# Patient Record
Sex: Female | Born: 1970 | Race: White | Hispanic: No | Marital: Married | Smoking: Never smoker
Health system: Southern US, Community
[De-identification: ages and names within clinical notes are randomized; demographics above are authoritative.]

## PROBLEM LIST (undated history)

## (undated) DIAGNOSIS — M109 Gout, unspecified: Secondary | ICD-10-CM

## (undated) DIAGNOSIS — R7301 Impaired fasting glucose: Secondary | ICD-10-CM

## (undated) DIAGNOSIS — F419 Anxiety disorder, unspecified: Secondary | ICD-10-CM

## (undated) DIAGNOSIS — Z8669 Personal history of other diseases of the nervous system and sense organs: Secondary | ICD-10-CM

## (undated) DIAGNOSIS — K589 Irritable bowel syndrome without diarrhea: Secondary | ICD-10-CM

## (undated) DIAGNOSIS — T7840XA Allergy, unspecified, initial encounter: Secondary | ICD-10-CM

## (undated) DIAGNOSIS — E119 Type 2 diabetes mellitus without complications: Secondary | ICD-10-CM

## (undated) HISTORY — DX: Personal history of other diseases of the nervous system and sense organs: Z86.69

## (undated) HISTORY — DX: Irritable bowel syndrome, unspecified: K58.9

## (undated) HISTORY — DX: Impaired fasting glucose: R73.01

## (undated) HISTORY — PX: UPPER GASTROINTESTINAL ENDOSCOPY: SHX188

## (undated) HISTORY — DX: Allergy, unspecified, initial encounter: T78.40XA

## (undated) HISTORY — DX: Type 2 diabetes mellitus without complications: E11.9

## (undated) HISTORY — DX: Anxiety disorder, unspecified: F41.9

---

## 1997-02-22 HISTORY — PX: EYE SURGERY: SHX253

## 2000-02-23 HISTORY — PX: BREAST REDUCTION SURGERY: SHX8

## 2000-09-01 ENCOUNTER — Encounter: Payer: Self-pay | Admitting: Family Medicine

## 2000-09-01 ENCOUNTER — Ambulatory Visit (HOSPITAL_COMMUNITY): Admission: RE | Admit: 2000-09-01 | Discharge: 2000-09-01 | Payer: Self-pay | Admitting: Family Medicine

## 2000-12-05 ENCOUNTER — Other Ambulatory Visit: Admission: RE | Admit: 2000-12-05 | Discharge: 2000-12-05 | Payer: Self-pay | Admitting: Obstetrics and Gynecology

## 2000-12-06 ENCOUNTER — Ambulatory Visit (HOSPITAL_COMMUNITY): Admission: RE | Admit: 2000-12-06 | Discharge: 2000-12-06 | Payer: Self-pay | Admitting: Family Medicine

## 2000-12-06 ENCOUNTER — Encounter: Payer: Self-pay | Admitting: Family Medicine

## 2000-12-29 ENCOUNTER — Ambulatory Visit (HOSPITAL_COMMUNITY): Admission: RE | Admit: 2000-12-29 | Discharge: 2000-12-29 | Payer: Self-pay | Admitting: Family Medicine

## 2000-12-29 ENCOUNTER — Encounter: Payer: Self-pay | Admitting: Family Medicine

## 2001-04-26 ENCOUNTER — Encounter (HOSPITAL_COMMUNITY): Admission: RE | Admit: 2001-04-26 | Discharge: 2001-05-26 | Payer: Self-pay | Admitting: Preventative Medicine

## 2001-06-22 ENCOUNTER — Encounter: Payer: Self-pay | Admitting: *Deleted

## 2001-06-22 ENCOUNTER — Emergency Department (HOSPITAL_COMMUNITY): Admission: EM | Admit: 2001-06-22 | Discharge: 2001-06-22 | Payer: Self-pay | Admitting: *Deleted

## 2001-07-19 ENCOUNTER — Encounter (HOSPITAL_COMMUNITY): Admission: RE | Admit: 2001-07-19 | Discharge: 2001-08-18 | Payer: Self-pay | Admitting: Preventative Medicine

## 2001-12-11 ENCOUNTER — Encounter (INDEPENDENT_AMBULATORY_CARE_PROVIDER_SITE_OTHER): Payer: Self-pay | Admitting: *Deleted

## 2001-12-11 ENCOUNTER — Ambulatory Visit (HOSPITAL_BASED_OUTPATIENT_CLINIC_OR_DEPARTMENT_OTHER): Admission: RE | Admit: 2001-12-11 | Discharge: 2001-12-11 | Payer: Self-pay | Admitting: Specialist

## 2002-08-28 ENCOUNTER — Encounter: Payer: Self-pay | Admitting: *Deleted

## 2002-08-28 ENCOUNTER — Emergency Department (HOSPITAL_COMMUNITY): Admission: EM | Admit: 2002-08-28 | Discharge: 2002-08-28 | Payer: Self-pay | Admitting: *Deleted

## 2002-12-11 ENCOUNTER — Encounter: Payer: Self-pay | Admitting: Obstetrics and Gynecology

## 2002-12-11 ENCOUNTER — Ambulatory Visit (HOSPITAL_COMMUNITY): Admission: RE | Admit: 2002-12-11 | Discharge: 2002-12-11 | Payer: Self-pay | Admitting: Obstetrics and Gynecology

## 2003-02-23 HISTORY — PX: CHOLECYSTECTOMY: SHX55

## 2003-02-23 HISTORY — PX: ABDOMINAL HYSTERECTOMY: SHX81

## 2003-10-20 ENCOUNTER — Emergency Department (HOSPITAL_COMMUNITY): Admission: EM | Admit: 2003-10-20 | Discharge: 2003-10-20 | Payer: Self-pay | Admitting: Emergency Medicine

## 2003-10-22 ENCOUNTER — Ambulatory Visit (HOSPITAL_COMMUNITY): Admission: RE | Admit: 2003-10-22 | Discharge: 2003-10-22 | Payer: Self-pay | Admitting: Family Medicine

## 2003-10-24 ENCOUNTER — Ambulatory Visit (HOSPITAL_COMMUNITY): Admission: RE | Admit: 2003-10-24 | Discharge: 2003-10-24 | Payer: Self-pay | Admitting: Family Medicine

## 2003-10-25 ENCOUNTER — Inpatient Hospital Stay (HOSPITAL_COMMUNITY): Admission: AD | Admit: 2003-10-25 | Discharge: 2003-10-27 | Payer: Self-pay | Admitting: Family Medicine

## 2003-11-04 ENCOUNTER — Inpatient Hospital Stay (HOSPITAL_COMMUNITY): Admission: AD | Admit: 2003-11-04 | Discharge: 2003-11-06 | Payer: Self-pay | Admitting: Family Medicine

## 2004-01-14 ENCOUNTER — Observation Stay (HOSPITAL_COMMUNITY): Admission: RE | Admit: 2004-01-14 | Discharge: 2004-01-15 | Payer: Self-pay | Admitting: Obstetrics & Gynecology

## 2005-07-26 ENCOUNTER — Ambulatory Visit (HOSPITAL_COMMUNITY): Admission: RE | Admit: 2005-07-26 | Discharge: 2005-07-26 | Payer: Self-pay | Admitting: Family Medicine

## 2009-08-27 ENCOUNTER — Ambulatory Visit (HOSPITAL_COMMUNITY): Admission: RE | Admit: 2009-08-27 | Discharge: 2009-08-27 | Payer: Self-pay | Admitting: Obstetrics and Gynecology

## 2010-02-22 HISTORY — PX: BLADDER SUSPENSION: SHX72

## 2010-04-22 ENCOUNTER — Other Ambulatory Visit: Payer: Self-pay | Admitting: Obstetrics and Gynecology

## 2010-04-22 DIAGNOSIS — R928 Other abnormal and inconclusive findings on diagnostic imaging of breast: Secondary | ICD-10-CM

## 2010-04-28 ENCOUNTER — Ambulatory Visit
Admission: RE | Admit: 2010-04-28 | Discharge: 2010-04-28 | Disposition: A | Discharge status: Home / Self Care | Payer: Commercial Managed Care - PPO | Source: Ambulatory Visit | Attending: Obstetrics and Gynecology | Admitting: Obstetrics and Gynecology

## 2010-04-28 DIAGNOSIS — R928 Other abnormal and inconclusive findings on diagnostic imaging of breast: Secondary | ICD-10-CM

## 2010-05-10 LAB — URINALYSIS, MICROSCOPIC ONLY
Bilirubin Urine: NEGATIVE
Glucose, UA: NEGATIVE mg/dL
Hgb urine dipstick: NEGATIVE
Leukocytes, UA: NEGATIVE
Specific Gravity, Urine: >1.03 — ABNORMAL HIGH (ref 1.005–1.030)
Urobilinogen, UA: 0.2 mg/dL (ref 0.0–1.0)
pH: 6 (ref 5.0–8.0)

## 2010-05-10 LAB — CBC
HCT: 39.6 % (ref 36.0–46.0)
Hemoglobin: 13.7 g/dL (ref 12.0–15.0)
MCHC: 34.6 g/dL (ref 30.0–36.0)
MCV: 92.5 fL (ref 78.0–100.0)
RBC: 4.29 MIL/uL (ref 3.87–5.11)
WBC: 7.8 10*3/uL (ref 4.0–10.5)

## 2010-05-10 LAB — SURGICAL PCR SCREEN: Staphylococcus aureus: POSITIVE — AB

## 2010-07-10 NOTE — H&P (Signed)
NAME:  [name], [name]                  ACCOUNT NO.:  [id number]   MEDICAL RECORD NO.:  [id number]          PATIENT TYPE:  AMB   LOCATION:  DAY                           FACILITY:  [facility]   PHYSICIAN:  Lazaro Arms, M.D.   DATE OF BIRTH:  12-09-1970   DATE OF ADMISSION:  DATE OF DISCHARGE:  LH                                HISTORY & PHYSICAL   HISTORY OF PRESENT ILLNESS:  [name] is a 40 year old white female, gravida 3,  para 1, abortus 1, with a last menstrual period of January 01, 2004, who is  admitted for a vaginal hysterectomy.  The patient has had a long history of  very heavy menstrual periods, and of late they have been every 2-3 weeks.  She also has discomfort sometimes during, but always after, intercourse  within the next day or two.  She was tested for interstitial cystitis, and  had a negative potassium hydroxide test.  On examination, she has tenderness  of the uterus.  The adnexa are negative.  Ultrasound was normal in the past.   We discussed options, including endometrial ablation, but with her history  of pain during and significantly after intercourse, we decided to proceed  with a vaginal hysterectomy.   PAST MEDICAL HISTORY:  Negative.   PAST SURGICAL HISTORY:  1.  Tubal ligation.  2.  Breast reduction.  3.  Laparoscopic cholecystectomy just recently.   ALLERGIES:  None.   MEDICATIONS:  None.   REVIEW OF SYSTEMS:  Otherwise negative.   PHYSICAL EXAMINATION:  HEENT:  Unremarkable.  NECK:  Thyroid is normal.  LUNGS:  Clear.  HEART:  Regular rate and rhythm without murmurs, rubs, or gallops.  BREASTS:  Without mass, discharge, or skin changes.  ABDOMEN:  Benign.  No hepatosplenomegaly or masses.  PELVIC:  She has normal external genitalia.  Vagina is pink and moist  without discharge.  The cervix is without lesions.  The uterus is normal  size, shape, and contour.  Tender or palpation.  The adnexae are negative.  EXTREMITIES:  Warm with no edema.  NEUROLOGIC:  Grossly intact.   IMPRESSION:  1.  Menometrorrhagia.  2.  Dysmenorrhea.  3.  Dyspareunia.   PLAN:  The patient is admitted for a vaginal hysterectomy.  She understands  the risks, benefits, indications, and alternatives, and will proceed.     Luth   LHE/MEDQ  D:  01/13/2004  T:  01/13/2004  Job:  [id number]

## 2010-07-10 NOTE — Op Note (Signed)
   NAME:  [name], [name]                            ACCOUNT NO.:  [id number]   MEDICAL RECORD NO.:  [id number]                   PATIENT TYPE:  AMB   LOCATION:  DSC                                  FACILITY:  [facility]   PHYSICIAN:  Earvin Hansen L. Shon Smith, M.D.           DATE OF BIRTH:  Aug 29, 1970   DATE OF PROCEDURE:  12/11/2001  DATE OF DISCHARGE:                                 OPERATIVE REPORT   INDICATIONS FOR PROCEDURE:  A 40 year old lady with severe macromastia and  back and shoulder pain secondary to large, pendulous breasts, increased  intertrigo, increased pitting at both shoulder areas causing increased tug  on the shoulders, irritation and interference with her job duties.   PROCEDURE:  Bilateral breast reductions using the inferior pedicle  technique.   SURGEON:  [name], M.D.   ANESTHESIA:  General anesthesia.   Preoperatively, the patient was explained the procedure as well as the risks  and possible complications.  She still wishes to proceed with the surgery.   DESCRIPTION OF PROCEDURE:  The patient was taken to the operating room and  placed on the operating table   DICTATION ENDED AT THIS POINT.                                               [name], M.D.    Cathie Hoops  D:  12/11/2001  T:  12/11/2001  Job:  [id number]

## 2010-07-10 NOTE — H&P (Signed)
NAME:  [name], [name]                            ACCOUNT NO.:  [id number]   MEDICAL RECORD NO.:  [id number]                   PATIENT TYPE:  INP   LOCATION:  A327                                 FACILITY:  [facility]   PHYSICIAN:  Donna Bernard, M.D.             DATE OF BIRTH:  [birth year]   DATE OF ADMISSION:  10/25/2003  DATE OF DISCHARGE:                                HISTORY & PHYSICAL   CHIEF COMPLAINT:  Abdominal pain.   HISTORY OF PRESENT ILLNESS:  The patient is a 40 year old white female with  a fairly benign prior medical history who presented to the hospital on the  day of admission with complaints of abdominal pain.  She was seen in the  emergency room last weekend with epigastric pain.  At that time, she had a  CT scan which effectively ruled out appendicitis.  Blood work at that time  was normal.  She was given Vicodin which really did not help her much.  We  saw her Monday on followup.  She was having a lot of discomfort.  We elected  to go ahead and cover her with proton pump inhibitors, I believe Protonix  one daily.  We maintained her Zelnorm 6 mg b.i.d.  A gallbladder ultrasound  was ordered.  This returned showing a borderline enlarged gallbladder but no  obvious abnormalities, with ongoing pain and discomfort.  We elected to do a  HIDA scan.  The HIDA scan revealed a diminished ejection fraction of 31%.  The patient has continued this week to have an extremely fatty diet  including pizza, hamburgers, tater tots and about every other fatty food  known to [place].  She continues to experience pain when she has these  fatty meals.  She has had no urinary symptoms.  She has had a lot of loose  stools this week.   MEDICATIONS:  1.  The patient notes compliance in medicines which includes Xanax 0.5 mg      one b.i.d. p.r.n.  2.  Zelnorm 6 mg one b.i.d.  3.  Allegra 180 mg daily.  4.  Vicodin p.r.n. for pain this week.  5.  Protonix 40 mg daily since evaluation early  in the week.   PRIOR MEDICAL HISTORY:  Significant for two children.  The patient is status  post recent reduction mammoplasty.   FAMILY HISTORY:  Positive for hypertension, hyperlipidemia.  The patient is  married.  No tobacco use.  No alcohol use.   ALLERGIES:  No known allergies.   PHYSICAL EXAMINATION:  VITAL SIGNS:  Afebrile.  BP 110/78, weight 200.  GENERAL:  The patient is alert, in no acute apparent distress.  HEENT:  Normal.  Hydration appears adequate.  NECK:  Supple.  LUNGS:  Clear.  HEART:  Regular rate and rhythm.  ABDOMEN:  Positive epigastric tenderness, positive right upper quadrant  tenderness.  No rebound, no guarding, no  CVA tenderness.  PELVIC:  Exam not performed.  No abdominal tenderness.  EXTREMITIES: Normal, no edema.  NEUROLOGIC:  Exam is intact.   LABORATORY DATA:  Please see scans and their reports as noted in the present  illness.  CBC:  White blood count 8000, hemoglobin 13.  Potassium 3.2.   Chest x-ray and upright abdominal x-ray normal.   IMPRESSION:  Epigastric and right upper quadrant and flank pain progressive  over the past week with negative CT, negative blood work.  HIDA scan is  suggestive of gallbladder problems with a diminished ejection fraction of  31%.  The patient may also have gastritis or an ulcer.  I have spoken about  her case with Dr. Jonathon Bellows.  Plan upper endoscopy tomorrow.  If  negative, surgery consultation for potential gallbladder disease.  Also, we  will treat hypokalemia.     ___________________________________________                                         Donna Bernard, M.D.   WSL/MEDQ  D:  10/25/2003  T:  10/25/2003  Job:  [id number]

## 2010-07-10 NOTE — Op Note (Signed)
[name], [name]                  ACCOUNT NO.:  [id number]   MEDICAL RECORD NO.:  [id number]          PATIENT TYPE:  AMB   LOCATION:  DAY                           FACILITY:  [facility]   PHYSICIAN:  Lazaro Arms, M.D.   DATE OF BIRTH:  05-Jul-1970   DATE OF PROCEDURE:  01/14/2004  DATE OF DISCHARGE:                                 OPERATIVE REPORT   PREOPERATIVE DIAGNOSES:  1.  Menometrorrhagia.  2.  Dysmenorrhea.  3.  Dyspareunia.   POSTOPERATIVE DIAGNOSES:  1.  Menometrorrhagia.  2.  Dysmenorrhea.  3.  Dyspareunia.   PROCEDURE:  Vaginal hysterectomy.   SURGEON:  Lazaro Arms, M.D.   ANESTHESIA:  General endotracheal.   FINDINGS:  The patient had a globular uterus, possibly adenomyosis, possibly  some small fibroids.  The ovaries appeared to be normal.  She had a small  corpus luteum cyst on her right ovary.   DESCRIPTION OF OPERATION:  The patient was taken to the operating room and  placed in the supine position and underwent general endotracheal anesthesia.  She was placed in the dorsal lithotomy position.  The peritoneum was shaved.  The lower abdomen, inner thighs, perineum, and perirectal area were prepped  in the usual sterile fashion.  The vagina underwent a vigorous prep.  She  was draped in the usual sterile fashion.  A Foley catheter was placed.  A  weighted speculum was placed.  The cervix was grasped.  Marcaine 0.5% with  1:200,000 epinephrine was injected circumferentially about the cervix for  both hemostasis and to delineate the proper plane.  Incision was made with  the electrocautery unit and the vagina was pushed off the lower uterine  segment both anteriorly and posteriorly.  The posterior cul-de-sac was  entered without difficulty, the uterosacral ligaments were clamped, cut, and  suture ligated and held.  The cardinal ligaments were clamped, cut, and  suture ligated and cut.  The anterior cul-de-sac was entered without  difficulty.  The anterior and  posterior leaves of the broad ligament were  plicated.  Uterine vessels were clamped, cut, and transfixed and suture  ligated.  Serial pedicles taken at the fundus, each pedicle being clamped,  cut, and suture ligated.  The utero-ovarian ligaments were crossclamped.  The specimen was removed.  Double suture ligation was performed.  There was  good hemostasis.  This was confirmed, and these were then released.  There  was a small amount of tubal fimbriae that were bleeding.  This was removed  without difficulty.  Additional sutures were placed for hemostasis.  The  lower pelvis was irrigated, all pedicles found to be hemostatic.  The  anterior vagina, anterior peritoneum, posterior peritoneum, and posterior  vagina were closed in interrupted  fashion from side to side without difficulty.  The patient tolerated the  procedure well.  She experienced 100 mL of blood loss, was taken to the  recovery room in good, stable condition.  All counts were correct.  Specimens went to the lab.  She received Ancef prophylactically.     Luth  LHE/MEDQ  D:  01/14/2004  T:  01/14/2004  Job:  [id number]

## 2010-07-10 NOTE — Op Note (Signed)
NAME:  [name], [name]                            ACCOUNT NO.:  [id number]   MEDICAL RECORD NO.:  [id number]                   PATIENT TYPE:  AMB   LOCATION:  DSC                                  FACILITY:  [facility]   PHYSICIAN:  Earvin Hansen L. Shon Hough, M.D.           DATE OF BIRTH:  09/14/1970   DATE OF PROCEDURE:  12/11/2001  DATE OF DISCHARGE:  12/11/2001                                 OPERATIVE REPORT   INDICATIONS FOR PROCEDURE:  A 85-odd -year-old lady with severe, severe  macromastia, back and shoulder pain secondary to large pendulous breasts.   PROCEDURE:  Bilateral breast reduction using the inferior pedicle technique.  Incision of accessory breast tissue.   SURGEON:  Yaakov Guthrie. Shon Hough, M.D.   ASSISTANT:  Alethia Berthold, C.F.A.   ANESTHESIA:  General anesthesia.   DESCRIPTION OF PROCEDURE:  The patient was taken to the operating room and  placed on the operating table in the sitting position. She was given the  drawings for the reduction mammoplasty, inferior pedicle type, remarking the  nipple areolar complexes from 20 cm to the suprasternal notch. She then  underwent general anesthesia and was intubated orally.  Prep was done to the  chest and breast areas in routine fashion using Betadine soap and solution,  walled off with sterile towels and drapes so as to make a sterile field.  Xylocaine 0.25% with epinephrine was injected locally, 125 cc per side.  The  wounds were scored with #15 blades then the skin on the inferior pedicle was  deepithelialized with #20 blades.  The medial and lateral fatty dermal  pedicles were excised down to underlying fascia.  The new keyhole area was  also debulked out laterally and accessory breast tissue was removed directly  as well as liposuction assistance.  After proper hemostasis, the flaps were  transposed and stayed with 3-0 Prolene, subcutaneous closure was done with 3-  0 Monocryl x2 layers and then a running subcuticular stitch with  3-0  Monocryl and 5-0 Monocryl throughout the inverted T. The wounds were  cleansed. Steri-Strips as well as bulky dressing were applied. The wounds  were drained with #10 Blake drains, fully fluted, which were placed in the  depths of the wound at the lateral most portion of the incision and secured  with 3-0 Prolene.  After this, the patient was taken to the recovery room in  excellent condition.   ESTIMATED BLOOD LOSS:  Less than 100 cc.   COMPLICATIONS:  None.   DISPOSITION:  She is to be admitted to overnight stay and then discharged on  December 12, 2001, to be seen back in my office this week for dressing  change.  She is also to call me for any medical problems.  Yaakov Guthrie. Shon Hough, M.D.    Cathie Hoops  D:  12/18/2001  T:  12/18/2001  Job:  [id number]

## 2010-07-10 NOTE — H&P (Signed)
NAME:  [name], [name]                            ACCOUNT NO.:  [id number]   MEDICAL RECORD NO.:  [id number]                   PATIENT TYPE:  INP   LOCATION:  A318                                 FACILITY:  [facility]   PHYSICIAN:  Dirk Dress. Katrinka Blazing, M.D.                DATE OF BIRTH:  March 10, 1970   DATE OF PROCEDURE:  DATE OF DISCHARGE:                                History & Physical   HISTORY OF PRESENT ILLNESS:  A 40 year old female who was admitted with  recurrent abdominal pain, nausea, vomiting, hypokalemia.  The patient gives  a history of onset of epigastric and right upper quadrant pain on August 28.  She noted that the pain was related to spicy and greasy food ingestion.  She  was evaluated by Dr. Lorin Picket A. Luking and underwent CT of the abdomen which  was normal.  Ultrasound was done, and this only showed a dilated gallbladder  without any other abnormalities.  She subsequently had a HIDA scan done on  September 1, which showed a decreased ejection fraction of 31% with  reproduction of her pain after ingestion of __________during the study.  The  pain has been continuous since September 2.  She was hospitalized at that  time.  She underwent upper endoscopy which showed a prepyloric ulcer with  superficial erosions and was treated with Protonix and Carafate.  She was H  pylori negative.  She was subsequently discharged, but has continued to be  symptomatic.  She is readmitted now with continued, severe pain.  She states  that she has had severe pain since discharge from the hospital.  She,  however, has not exercised much dietary discretion.  She is felt to have  severe biliary dyskinesia or acalculous cholecystitis which is nonresponsive  to present treatment.  The patient is seen in consultation for this problem.   PAST MEDICAL HISTORY:  She has irritable bowel syndrome which is  constipation predominant.  She has regular bowel movements with  Zelnorm  b.i.d.  She had been recently  started on Carafate slurry 1 gram q.i.d. and  Protonix 40 mg b.i.d.  This was for her peptic ulcer disease.   SURGICAL HISTORY:  Includes bilateral eye surgery, D&C, hemorrhoidectomy,  appendectomy, tubal ligation and bilateral reduction mammoplasty.   ALLERGIES:  She has no known drug allergies.   FAMILY HISTORY:  Positive for diabetes mellitus, polyps, atrial fibrillation  and diverticulosis.   SOCIAL HISTORY:  She is married.  She has two children.  She is employed  with [employer] locally.  She does not smoke.  She drinks  an occasional alcoholic beverage.   PHYSICAL EXAMINATION:  GENERAL:  The patient is a pleasant female, in no  acute distress.  She is lying quietly in bed.  VITAL SIGNS:  Blood pressure 121/70, pulse 108, respirations 20, temperature  98.2.  Weight 195 pounds.  Height  5 feet, 5 inches.  HEENT:  Unremarkable.  There is no jaundice.  NECK:  Supple, no JVD, bruits, adenopathy or thyromegaly.  CHEST:  Clear to auscultation.  HEART:  Regular rate and rhythm without murmur, gallop or rub.  ABDOMEN:  Obese, moderate epigastric with severe right upper-quadrant  tenderness.  She has hyperactive bowel sounds.  She does not have any left  upper quadrant tenderness or right lower-quadrant tenderness.  There is mild  tenderness in the right CVA region, and the right upper chest posteriorly.  She has three scars in her upper epigastric area from removal of the  cutaneous nevi.  EXTREMITIES:  No cyanosis, clubbing or edema.  There is a small superficial  laceration of the left leg medially.  NEUROLOGIC:  No focal motor, sensory or cerebellar deficits.   SIGNIFICANT LABS:  Her potassium is 2.8.   IMPRESSION:  1.  Severe biliary dyskinesia or acalculous cholecystitis.  2.  Gastric ulcer with thick gastric erosions, H pylori negative.  3.  Irritable bowel syndrome, constipation predominant.  4.  Hypokalemia.   RECOMMENDATIONS:  We will proceed with IV  and p.o. potassium, chloride  replacement.  She has been scheduled for laparoscopic cholecystectomy in the  morning.  The procedure was outlined with the patient and her husband.  Sites of ports were drawn.  All questions were answered.  The patient gives  her verbal consent for proceeding with this operative procedure in the  morning.      ___________________________________________                                            Dirk Dress. Katrinka Blazing, M.D.   LCS/MEDQ  D:  11/04/2003  T:  11/04/2003  Job:  [id number]

## 2010-07-10 NOTE — Op Note (Signed)
NAME:  [name], [name]                            ACCOUNT NO.:  [id number]   MEDICAL RECORD NO.:  [id number]                   PATIENT TYPE:  INP   LOCATION:  A327                                 FACILITY:  [facility]   PHYSICIAN:  R. Roetta Sessions, M.D.              DATE OF BIRTH:  [birth year]   DATE OF PROCEDURE:  10/26/2003  DATE OF DISCHARGE:  10/27/2003                                 OPERATIVE REPORT   PROCEDURE:  EGD with biopsy.   INDICATIONS FOR PROCEDURE:  This patient is a 40 year old lady admitted to  the hospital with epigastric right upper quadrant abdominal pain.  Gallbladder ultrasound revealed the gallbladder polyp on a HIDA demonstrated  diminished EF of 31% __________ of her symptoms.  She has been taking  ibuprofen recently related to her menstrual cycle and also has constipation-  predominant IBS for which she has Zelnorm since her oral contrast for her CT  a number of days ago.  She developed diarrhea but unfortunately continued  taking Zelnorm.  She stopped the Zelnorm (last dose yesterday morning) and  has not had any diarrhea since.  EGD is now being done to rule out mucosa  process of upper GI tract contributing to her symptoms.  This approach has  been discussed with her and her husband at length.  Potential risks,  benefits, and alternatives have been reviewed, questions answered.  Please  see my documentation in medical record for more information.   PROCEDURE NOTE:  O2 saturation, blood pressure, pulse, and respirations were  monitored throughout the entire procedure.  Conscious sedation Versed 8 mg  IV, Demerol 150 mg IV in divided doses.  Instrument:  Olympus video chip  system.   FINDINGS:  Examination of the tubular esophagus revealed no mucosal  abnormalities.  EG junction easily traversed.   STOMACH:  The gastric cavity was empty and insufflated well with air.  A  thorough examination of the gastric mucosa, including retroflexed view of  the proximal  stomach and esophagogastric junction demonstrated a 6 mm  stellate-shaped prepyloric ulcer with surrounding swollen mucosa and  multiple satellite erosions.  Mucosa otherwise appeared normal.  This lesion  did have a benign appearance.  Pylorus patent and easily traversed.  Examination of the bulb and second portion revealed no abnormalities.   THERAPY/DIAGNOSTIC MANEUVERS PERFORMED:  The ulcer was biopsied for  histologic study.  The patient tolerated the procedure well, we reacted in  endoscopy.   IMPRESSION:  1.  Normal esophagus.  2.  Stellate-shaped prepyloric ulcer with satellite erosions as described      above.  Ulcer biopsied.  Otherwise, normal gastric mucosa, patent      pylorus, normal D1, D2.  I suspect that these findings could explain a good portion of the patient's  recent symptoms.  She does have background symptoms of irritable bowel  syndrome (constipation predominant).  She developed diarrhea following  contrast for her CT scan days ago and has persisted until yesterday.  Unfortunately, she was taking Zelnorm through this period.  Medication was  held after first dose yesterday, and she has not had any stool since.   RECOMMENDATIONS:  1.  Would hold up on surgical consult for possible biliary dyskinesia      __________.  2.  Increase Protonix to 40 mg orally b.i.d. briefly.  3.  Carafate 1 g slurries q.i.d.  4.  Follow up on biopsies.  5.  Check H. pylori serologies.  6.  Further recommendations to follow.      ___________________________________________                                            Jonathon Bellows, M.D.   RMR/MEDQ  D:  10/26/2003  T:  10/27/2003  Job:  [id number]   cc:   Donna Bernard, M.D.  [address]  [place]  [place] [address]  Fax: [phone]

## 2010-07-10 NOTE — Consult Note (Signed)
NAME:  [name], [name]                            ACCOUNT NO.:  [id number]   MEDICAL RECORD NO.:  [id number]                   PATIENT TYPE:  INP   LOCATION:  A327                                 FACILITY:  [facility]   PHYSICIAN:  R. Roetta Sessions, M.D.              DATE OF BIRTH:  [birth year]   DATE OF CONSULTATION:  10/25/2003  DATE OF DISCHARGE:                                   CONSULTATION   GASTROENTEROLOGY CONSULTATION:   PHYSICIAN REQUESTING CONSULTATION:  Dr. Lubertha South   REASON FOR CONSULTATION:  Abdominal pain/flank pain.   HISTORY OF PRESENT ILLNESS:  [name] is a 40 year old Caucasian female who  presents with a 7-day history of epigastric/hypogastric abdominal pain which  radiates into the right upper quadrant and right flank area and into her  back.  This is associated with nausea but no vomiting.  She developed these  symptoms approximately 7 days ago.  Denies any prior episodes.  Pain was  significant enough that she came to the emergency department on the  following day.  She had a CT of the abdomen and pelvis which revealed a  distal ascending colonic diverticulum but otherwise negative.  She saw Dr.  Gerda Diss in followup in his office.  He arranged for abdominal ultrasound  which revealed a gallbladder polyp.  She has subsequently had a HIDA scan  which revealed a gallbladder ejection fraction of 31% with reproduction of  her symptoms after drinking Half & Half.  Patient has had a couple of other  episodes of this abdominal pain noted after eating a fatty meal.  She had  pizza last night and this caused reproduction of the symptoms.  Patient is  also noted to have multiple loose stools after receiving contrast 6 days  ago.  She had two or three loose stools today.  Denies any vomiting,  heartburn symptoms, melena or rectal bleeding, fever or chills.  She denies  any dysuria or gross hematuria.  Her last menstrual cycle ended 7 days ago.   Upon admission she had normal  LFTs, amylase, lipase.  Potassium is low at  3.2.  White count 8.1, hemoglobin 12.8, platelets 345,000.  Notably 4 days  ago she had a urinalysis which had moderate blood in it.  She has a followup  urinalysis still pending.  She had an hCG which was negative.   MEDICATIONS PRIOR TO ADMISSION:  1.  Zelnorm 6 mg b.i.d.  2.  Protonix 40 mg daily started 3 days ago.  3.  Vicodin p.r.n. for this particular illness.  4.  She reports taking ibuprofen on an as-needed basis during her menstrual      cycle.   ALLERGIES:  No known drug allergies.   PAST MEDICAL HISTORY:  IBS with constipation on Zelnorm doing well.   PAST SURGICAL HISTORY:  Bilateral breast reduction, D&C in 1994, tubal  ligation 2002, wisdom teeth extraction, she  had LASIK eye surgery in 1995,  hemorrhoidectomy 1997, appendectomy 1996.   FAMILY HISTORY:  Positive for colonic polyps in her father.   SOCIAL HISTORY:  She is married and has two children.  She occasionally  consumes alcohol approximately once monthly.  Denies any tobacco use.   REVIEW OF SYSTEMS:  GI:  Please see HPI.  In addition, she generally has a  regular bowel movement daily on Zelnorm.  Denies any chronic abdominal pain.  CARDIOPULMONARY:  Denies any chest pain or shortness of breath.   PHYSICAL EXAMINATION:  VITAL SIGNS:  Height 5 feet 5 inches, weight 196.9,  temperature 98, pulse 85, respirations 20, blood pressure 107/73.  GENERAL:  Pleasant mildly obese Caucasian female in no acute distress.  SKIN:  Warm and dry.  No jaundice.  HEENT:  Pupils equal, round, reactive to light.  Conjunctivae are pink.  Sclerae are nonicteric.  Oropharyngeal mucosa moist and pink.  No lesions,  erythema, or exudate.  No lymphadenopathy/thyromegaly.  CHEST:  Lungs clear to auscultation.  CARDIAC:  Regular rate and rhythm, normal S1, S2, no murmurs, rubs, or  gallops.  ABDOMEN:  Positive bowel sounds, obese but symmetrical, soft.  She has mild  to moderate tenderness  in the hypogastric/epigastric region along the right  upper quadrant.  No rebound tenderness or guarding.  No hepatosplenomegaly  or masses.  No CVA tenderness.  EXTREMITIES:  No edema.   LABORATORIES:  Total bilirubin 0.7, alkaline phosphatase 48, SGOT 22, SGPT  23, albumin 3.9, lipase 27, amylase 50, sodium 140, BUN 7, creatinine 0.7,  glucose 92.   IMPRESSION:  Patient is a pleasant 40 year old Caucasian female with 7-day  history of abdominal pain located primarily in the upper midabdomen along  the right upper quadrant and around the flank down the right lower back.  Pain is associated with nausea.  There has been a postprandial component.  She also has had a reproduction of her symptoms after a fatty meal during  HIDA scan.  She had a low gallbladder ejection fraction as well.  Although  symptoms are somewhat atypical for gallbladder disease would have to be  suspicious of this given reproduction of her symptoms along with a decreased  gallbladder ejection fraction.  We do need to consider peptic ulcer disease  and make sure her hematuria is not persistent as this may be indicative of a  renal or urinary tract stone.   PLAN:  1.  EGD in the morning.  2.  We will follow up with urinalysis as available.  3.  Begin IV Protonix 40 mg q.24h.  4.  We will go ahead and provide her with regular diet however, she will be      n.p.o. after midnight.  5.  Further recommendations to follow.   I would like to thank Dr. Lubertha South for allowing [place] to take part in the  care of this patient.     ________________________________________  ___________________________________________  Tana Coast, Pricilla Larsson, M.D.   LL/MEDQ  D:  10/25/2003  T:  10/25/2003  Job:  [id number]   cc:   Donna Bernard, M.D.  [address]  [place]  [place] [address]  Fax: [phone]

## 2010-07-10 NOTE — H&P (Signed)
NAME:  [name], [name] NO.:  [id number]   MEDICAL RECORD NO.:  [id number]                   PATIENT TYPE:   LOCATION:                                       FACILITY:   PHYSICIAN:  Donna Bernard, M.D.             DATE OF BIRTH:  [birth year]   DATE OF ADMISSION:  11/04/2003  DATE OF DISCHARGE:                                HISTORY & PHYSICAL   CHIEF COMPLAINT:  Abdominal pain.   SUBJECTIVE:  This patient is a 40 year old white female with a history of  irritable bowel syndrome and recent difficulties with abdominal pain.  The  patient was seen in the emergency room approximately two weekends ago with  abdominal pain, epigastric, right upper quadrant, and radiating towards the  back. She was worked up at that time with blood work which was negative and  a CT scan which showed no acute abnormality.  The patient followed up in the  office a couple of days later.  She still had ongoing pain and tenderness.  We started her on Protonix daily and set up a gallbladder ultrasound.  The  ultrasound returned normal.  The pain continued, so we did a HIDA scan which  showed an ejection fraction 31% along with pain when the patient drank half  and half.  The patient presented a couple of days later with significant  abdominal pain.  She was admitted to the hospital. We consulted GI folks.  They did endoscopy which revealed a peptic ulcer.  She was started on  Carafate and Protonix for this.  H. pylori serology was obtained which  reportedly returned negative.  The patient was advised by Dr. Jena Gauss that it  was negative.   The patient returns today, the day of admission, with complaints of  worsening pain primarily in her right upper quadrant with some radiation  toward the back. The pain has been worse after meals.  The pain is colicky  at times.  It is also associated with nausea.  The patient has no fever, no  diarrhea currently, although she had some diarrhea  last week.  The patient  claims compliance with her current medications.   CURRENT MEDICATIONS:  1.  Allegra 180 mg daily.  2.  Protonix 40 mg b.i.d.  3.  Carafate 1 g four times a day up until yesterday.  4.  Zelnorm 6 mg b.i.d., although the patient has recently held the Zelnorm.   ALLERGIES:  No true allergies.   FAMILY HISTORY:  Positive for hypertension, hyperlipidemia.   PAST SURGICAL HISTORY:  Eye surgery.   SOCIAL HISTORY:  The patient is married, no tobacco or alcohol use, one  child.   REVIEW OF SYSTEMS:  Otherwise negative.   PHYSICAL EXAMINATION:  VITAL SIGNS:  Blood pressure 120/92, afebrile.  GENERAL:  Alert.  HEENT: Normal.  NECK:  Supple.  LUNGS:  Clear.  HEART:  Regular rate and rhythm.  ABDOMEN:  Right epigastric and right upper quadrant tenderness noted with  mild right lower quadrant tenderness noted.  No rebound.  Bowel sounds  present.  No CVA tenderness.   SIGNIFICANT LABORATORY AND X-RAY DATA:  CBC:  White blood count and  hemoglobin normal.  Liver enzymes normal.  MET-7 good other than low  potassium at 2.8.   IMPRESSION:  1.  Biliary dyskinesia which is probable source of patient's ongoing      discomfort.  2.  Peptic ulcer disease with recent endoscopic-confirmed ulcer.  3.  History of irritable bowel syndrome.  4.  Hypokalemia.   PLAN:  1.  Admit for fluids, potassium regulation, pain control.  2.  I spoke with Dr. Elpidio Anis.  He will see the patient tonight in      consultation and anticipate cholecystectomy soon.     ___________________________________________                                         Donna Bernard, M.D.   WSL/MEDQ  D:  11/04/2003  T:  11/04/2003  Job:  [id number]

## 2010-07-10 NOTE — Discharge Summary (Signed)
NAME:  [name], [name]                            ACCOUNT NO.:  [id number]   MEDICAL RECORD NO.:  [id number]                   PATIENT TYPE:  INP   LOCATION:  A327                                 FACILITY:  [facility]   PHYSICIAN:  Donna Bernard, M.D.             DATE OF BIRTH:  1971-01-09   DATE OF ADMISSION:  10/25/2003  DATE OF DISCHARGE:  10/27/2003                                 DISCHARGE SUMMARY   FINAL DIAGNOSES:  1.  Peptic ulcer disease.  2.  Abdominal pain.  3.  Possible biliary dyskinesia.  4.  History of anxiety.  5.  History of irritable bowel syndrome.   FINAL DISPOSITION:  The patient discharged to home.   DISCHARGE MEDICATIONS:  1.  Carafate 2 teaspoons 1 hour before meals.  2.  Protonix 40 mg daily.   FOLLOW UP:  1.  Follow up in 1 to 2 weeks in our office.  2.  Follow up with Dr. Jena Gauss in 12 weeks.  3.  Avoid anti-inflammatory medications.   INITIAL HISTORY AND PHYSICAL:  Please see H&P as dictated.   HOSPITAL COURSE:  This patient is a 40 year old white female with a benign  prior medical history who presented to the hospital the day of admission  with complaints of significant abdominal pain. She had had a CT scan earlier  in the week which ruled out appendicitis. She also had an ultrasound which  showed no abnormality. There was a HIDA scan which showed a 31% diminished  ejection fraction. The patient was started on Protonix daily. The patient's  pain continued to worsen the day of admission. She was complaining of severe  pain. She was admitted to the hospital. GI folks were consulted. They  recommended doing an endoscopy before considering surgery consultation in  regards to her gallbladder. Of note, when the patient received her half and  half for the oral intake part of the HIDA scan, she did have significant  pain. At any rate, an endoscopy was performed. A peptic ulcer was  discovered. It was felt that this could be the source of her pain. Dr. Jena Gauss  recommended both Protonix and Carafate during her recovery phase. The  patient was discharged home with diagnoses and disposition was noted above.     ___________________________________________                                         Donna Bernard, M.D.   WSL/MEDQ  D:  11/07/2003  T:  11/07/2003  Job:  [id number]

## 2010-07-10 NOTE — Op Note (Signed)
NAME:  [name], [name]                            ACCOUNT NO.:  [id number]   MEDICAL RECORD NO.:  [id number]                   PATIENT TYPE:  INP   LOCATION:  A318                                 FACILITY:  [facility]   PHYSICIAN:  Dirk Dress. Katrinka Blazing, M.D.                DATE OF BIRTH:  [birth year]   DATE OF PROCEDURE:  11/05/2003  DATE OF DISCHARGE:                                 OPERATIVE REPORT   PREOPERATIVE DIAGNOSIS:  Acalculous cholecystitis.   POSTOPERATIVE DIAGNOSIS:  Acalculous cholecystitis.   PROCEDURE:  Laparoscopic cholecystectomy.   SURGEON:  Dr. Katrinka Blazing.   DESCRIPTION:  Under general anesthesia, the patient's abdomen was prepped  and draped in sterile field. Supraumbilical incision was made. Veress needle  was inserted. Abdomen was insufflated with 3 liters of CO2. Using a Visiport  guide, a 10-mm port was placed uneventfully. Laparoscope was placed and the  gallbladder was visualized. Under videoscopic guidance, a 10-mm port and two  5-mm ports were placed in the right subcostal region. The gallbladder was  positioned. The gallbladder was significantly distended with thickening of  the lower wall of the lower aspect of the gallbladder with some acute edema.  Adhesions in this area were taken down with electrocautery and blunt  dissection. Cystic artery was noted. It was clipped close to the gallbladder  and divided. Cystic duct was dissected. It was clipped with 5 clips and  divided. There was a posterior cystic artery branch which was clipped with 3  clips and divided. Gallbladder was then separated from the bed with  electrocautery. It was removed in an EndoCatch device. There was no bleeding  from the bed. Irrigation was carried out, and the fluid returned clear. CO2  was allowed to escape from the abdomen, and the ports were removed. The  patient tolerated the procedure well. The incisions were closed using 0  Vicryl on the supraumbilical incision and staples on the skin of  all the  incisions. Dressings were placed. The patient was awakened from anesthesia,  transferred to a bed, and taken to the post anesthetic care unit.      ___________________________________________                                            Dirk Dress. Katrinka Blazing, M.D.   LCS/MEDQ  D:  11/05/2003  T:  11/05/2003  Job:  [id number]   cc:   Donna Bernard, M.D.  [address]  [place]  [place] [address]  Fax: [phone]

## 2010-07-10 NOTE — Discharge Summary (Signed)
NAMEAZRIELLE, SPRINGSTEEN                  ACCOUNT NO.:  [id number]   MEDICAL RECORD NO.:  [id number]          PATIENT TYPE:  INP   LOCATION:  A318                          FACILITY:  [facility]   PHYSICIAN:  Dirk Dress. Katrinka Blazing, M.D.   DATE OF BIRTH:  [birth year]   DATE OF ADMISSION:  11/04/2003  DATE OF DISCHARGE:  09/14/2005LH                                 DISCHARGE SUMMARY   DISCHARGE DIAGNOSES:  1.  Acalculous cholecystitis.  2.  Cholesterosis.  3.  Gastri ulcer with erosions.  4.  Irritable bowel syndrome, constipation predominant.  5.  Hypokalemia.   SPECIAL PROCEDURE:  Laparoscopic cholecystectomy, November 05, 2003.   DISPOSITION:  The patient is discharged home in stable and satisfactory  condition.   DISCHARGE MEDICATIONS:  1.  Tylox 1-2 every four hours as needed for pain.  2.  Zelnorm 6 mg b.i.d.  3.  Carafate 1 gram q.i.d.  4.  Protonix 40 mg b.i.d.  5.  Allegra 180 mg every day.   The patient will be seen in the office in two weeks and will see Dr. Gerda Diss  in one week.   SUMMARY:  A 40 year old female admitted with recurrent abdominal pain,  nausea, vomiting, and hypokalemia.  She gives a history of onset of  epigastric and right upper quadrant pain dating back to October 20, 2003.  She was evaluated and was found to have an abnormal gallbladder ejection  fraction with reproduction of her symptoms after ingestion of half and half  during the procedure.  The patient had been continuously symptomatic since  October 25, 2003.  She had undergone upper endoscopy which showed pre-  pyloric ulcer with superficial erosions.  She was H. pylori negative.  The  patient was discharged home but continued to be symptomatic.  She had severe  pain.  She was discharged from the hospital.  She was re-admitted with  severe pain non-responsive to treatment.  She was seen in consultation for  cholecystectomy.  She was noted to be hypokalemic.  Potassium was 2.8.  Potassium was replaced.  The  patient underwent laparoscopic cholecystectomy, on November 05, 2003,  without difficulty.  She felt much better postoperatively.  She had mild  incisional discomfort.  She had no nausea and no vomiting.  She was able to  eat a regular diet.  She was discharged home in satisfactory condition on  the first postoperative day.     Lero   LCS/MEDQ  D:  12/07/2003  T:  12/07/2003  Job:  [id number]   cc:   Donna Bernard, M.D.  [address]  [place]  [place] [address]  Fax: [phone]

## 2010-12-16 ENCOUNTER — Other Ambulatory Visit: Payer: Self-pay | Admitting: General Surgery

## 2010-12-16 ENCOUNTER — Other Ambulatory Visit (HOSPITAL_COMMUNITY)
Admission: RE | Admit: 2010-12-16 | Discharge: 2010-12-16 | Disposition: A | Discharge status: Home / Self Care | Payer: 59 | Source: Ambulatory Visit | Attending: General Surgery | Admitting: General Surgery

## 2010-12-16 DIAGNOSIS — D2370 Other benign neoplasm of skin of unspecified lower limb, including hip: Secondary | ICD-10-CM | POA: Insufficient documentation

## 2012-07-22 ENCOUNTER — Encounter: Payer: Self-pay | Admitting: *Deleted

## 2012-08-04 ENCOUNTER — Ambulatory Visit: Payer: 59 | Admitting: Nurse Practitioner

## 2012-08-04 ENCOUNTER — Ambulatory Visit (INDEPENDENT_AMBULATORY_CARE_PROVIDER_SITE_OTHER): Payer: 59 | Admitting: Nurse Practitioner

## 2012-08-04 ENCOUNTER — Encounter: Payer: Self-pay | Admitting: Nurse Practitioner

## 2012-08-04 VITALS — BP 110/86 | HR 70 | Wt 186.0 lb

## 2012-08-04 DIAGNOSIS — R7301 Impaired fasting glucose: Secondary | ICD-10-CM

## 2012-08-04 MED ORDER — ESCITALOPRAM OXALATE 20 MG PO TABS: 20.0000 mg | ORAL_TABLET | Freq: Every day | ORAL | Status: DC

## 2012-08-04 MED ORDER — METFORMIN HCL 1000 MG PO TABS: 1000.0000 mg | ORAL_TABLET | Freq: Two times a day (BID) | ORAL | Status: DC

## 2012-08-04 MED ORDER — ALPRAZOLAM 0.5 MG PO TABS: ORAL_TABLET | ORAL | Status: DC

## 2012-08-04 MED ORDER — CYCLOBENZAPRINE HCL 10 MG PO TABS: 10.0000 mg | ORAL_TABLET | Freq: Three times a day (TID) | ORAL | Status: DC | PRN

## 2012-08-04 MED ORDER — PHENTERMINE HCL 37.5 MG PO TABS: 37.5000 mg | ORAL_TABLET | Freq: Every day | ORAL | Status: DC

## 2012-08-04 MED ORDER — PHENTERMINE HCL 37.5 MG PO CAPS: 37.5000 mg | ORAL_CAPSULE | ORAL | Status: DC

## 2012-08-08 ENCOUNTER — Encounter: Payer: Self-pay | Admitting: Nurse Practitioner

## 2012-08-08 DIAGNOSIS — R7301 Impaired fasting glucose: Secondary | ICD-10-CM | POA: Insufficient documentation

## 2012-08-08 NOTE — Assessment & Plan Note (Signed)
On 07/14/12 her fasting glucose was 85 and hemoglobin A1c is 5.4.

## 2012-08-08 NOTE — Progress Notes (Signed)
Subjective:  Presents for recheck. Has brought her lab work in from another provider. Doing well with regular exercise and healthy diet. Would like to continue her phentermine for another 3 months.  Objective:   BP 110/86  Pulse 70  Wt 186 lb (84.369 kg) NAD. Alert, oriented. Cheerful affect. Lungs clear. Heart regular rate rhythm. Has lost another 10 pounds since March. See lab work dated 07/14/2012.  Assessment: #1 impaired fasting glucose #2 morbid obesity improving  Plan: Continue current meds as directed. Consulted with Dr. Brett Canales. Patient given another 3 months refills on Adipex, will need to stop medication for at least 6 months after this. Encouraged patient to continue healthy lifestyle changes. Recheck in 6 months, call back sooner if any problems.

## 2012-11-13 ENCOUNTER — Other Ambulatory Visit: Payer: Self-pay | Admitting: *Deleted

## 2012-11-13 MED ORDER — ALPRAZOLAM 0.5 MG PO TABS: ORAL_TABLET | ORAL | Status: DC

## 2012-11-13 MED ORDER — CYCLOBENZAPRINE HCL 10 MG PO TABS: 10.0000 mg | ORAL_TABLET | Freq: Every evening | ORAL | Status: DC | PRN

## 2012-11-30 ENCOUNTER — Encounter: Payer: Self-pay | Admitting: Family Medicine

## 2012-11-30 ENCOUNTER — Ambulatory Visit (INDEPENDENT_AMBULATORY_CARE_PROVIDER_SITE_OTHER): Payer: 59 | Admitting: Family Medicine

## 2012-11-30 VITALS — BP 120/78 | Temp 98.6°F | Ht 65.0 in | Wt 191.0 lb

## 2012-11-30 DIAGNOSIS — H6690 Otitis media, unspecified, unspecified ear: Secondary | ICD-10-CM

## 2012-11-30 DIAGNOSIS — H6691 Otitis media, unspecified, right ear: Secondary | ICD-10-CM

## 2012-11-30 MED ORDER — AMOXICILLIN-POT CLAVULANATE 875-125 MG PO TABS: ORAL_TABLET | ORAL | Status: AC

## 2012-11-30 NOTE — Progress Notes (Signed)
  Subjective:    Patient ID: [name], female    DOB: 11/24/1970, 42 y.o.   MRN: [id number]  Ear Drainage  There is pain in the right ear. This is a new problem. The current episode started in the past 7 days. The problem has been unchanged. There has been no fever. The pain is at a severity of 4/10. The pain is moderate. Associated symptoms include ear discharge. Associated symptoms comments: Itchy eyes. She has tried nothing for the symptoms. The treatment provided no relief.   Three d ago  Sig pain and swelling  Advil prn,  Allergies acting up some   Review of Systems  HENT: Positive for ear discharge.    no actual fevers slight congestion.     Objective:   Physical Exam  Alert moderate malaise neck supple frontal maxillary tenderness evident lungs clear. Heart regular rate and rhythm. Right ear infected. Tender right cheek.      Assessment & Plan:  Impression otitis media with possible element of sinusitis plan antibiotics prescribed. Symptomatic care discussed. WSL

## 2012-12-01 ENCOUNTER — Ambulatory Visit: Payer: 59 | Admitting: Nurse Practitioner

## 2012-12-13 ENCOUNTER — Telehealth: Payer: Self-pay | Admitting: Family Medicine

## 2012-12-13 MED ORDER — FLUCONAZOLE 150 MG PO TABS: ORAL_TABLET | ORAL | Status: DC

## 2012-12-13 MED ORDER — AMOXICILLIN-POT CLAVULANATE 875-125 MG PO TABS: 1.0000 | ORAL_TABLET | Freq: Two times a day (BID) | ORAL | Status: AC

## 2012-12-13 NOTE — Telephone Encounter (Signed)
Pt needs some something for the fluid on her ears, either a refill on an antibiotic or some flonase? She also needs Diflucan x2 for a yeast infection from the first round of antibiotics... [facility]

## 2012-12-13 NOTE — Telephone Encounter (Signed)
Refill aug 875 bid ten d, difl 150 numb two one three d apart, let her know fluid in adults is very slow to fade can laswt wks decongestants don't help

## 2012-12-13 NOTE — Telephone Encounter (Signed)
Left message on voicemail notifying patient that meds were sent to pharmacy. 

## 2013-02-09 ENCOUNTER — Encounter: Payer: Self-pay | Admitting: Nurse Practitioner

## 2013-02-09 ENCOUNTER — Ambulatory Visit (INDEPENDENT_AMBULATORY_CARE_PROVIDER_SITE_OTHER): Payer: 59 | Admitting: Nurse Practitioner

## 2013-02-09 VITALS — BP 122/80 | Ht 66.0 in | Wt 189.0 lb

## 2013-02-09 DIAGNOSIS — F329 Major depressive disorder, single episode, unspecified: Secondary | ICD-10-CM

## 2013-02-09 DIAGNOSIS — M25569 Pain in unspecified knee: Secondary | ICD-10-CM

## 2013-02-09 DIAGNOSIS — F341 Dysthymic disorder: Secondary | ICD-10-CM

## 2013-02-09 DIAGNOSIS — R7301 Impaired fasting glucose: Secondary | ICD-10-CM

## 2013-02-09 DIAGNOSIS — G8929 Other chronic pain: Secondary | ICD-10-CM

## 2013-02-09 LAB — POCT GLYCOSYLATED HEMOGLOBIN (HGB A1C): Hemoglobin A1C: 5.7

## 2013-02-09 MED ORDER — DICLOFENAC SODIUM 75 MG PO TBEC: 75.0000 mg | DELAYED_RELEASE_TABLET | Freq: Two times a day (BID) | ORAL | Status: DC

## 2013-02-09 MED ORDER — ESCITALOPRAM OXALATE 20 MG PO TABS: 20.0000 mg | ORAL_TABLET | Freq: Every day | ORAL | Status: DC

## 2013-02-09 MED ORDER — METFORMIN HCL 1000 MG PO TABS: 1000.0000 mg | ORAL_TABLET | Freq: Two times a day (BID) | ORAL | Status: DC

## 2013-02-14 ENCOUNTER — Encounter: Payer: Self-pay | Admitting: Nurse Practitioner

## 2013-02-14 DIAGNOSIS — G8929 Other chronic pain: Secondary | ICD-10-CM | POA: Insufficient documentation

## 2013-02-14 DIAGNOSIS — F329 Major depressive disorder, single episode, unspecified: Secondary | ICD-10-CM | POA: Insufficient documentation

## 2013-02-14 NOTE — Progress Notes (Signed)
Subjective:  Presents for recheck on her right knee pain. Has occasional flareups of this. Diclofenac resolves her symptoms. Is still on metformin. Continues to work on her weight. Has had increased stress in her life especially at work. Taking Xanax twice a day many days of the week. Would like to restart her Lexapro which has worked well in the past.  Objective:   BP 122/80  Ht 5\' 6"  (1.676 m)  Wt 189 lb (85.73 kg)  BMI 30.52 kg/m2 NAD. Alert, oriented. Lungs clear. Heart regular rhythm. Right knee minimal crepitus, tenderness mainly with ambulation.   Assessment:Impaired fasting glucose - Plan: POCT glycosylated hemoglobin (Hb A1C)  Knee pain, chronic, right  Anxiety and depression  Plan: Meds ordered this encounter  Medications  . diclofenac (VOLTAREN) 75 MG EC tablet    Sig: Take 1 tablet (75 mg total) by mouth 2 (two) times daily.    Dispense:  60 tablet    Refill:  1    Order Specific Question:  Supervising Provider    Answer:  Merlyn Albert [[id number]]  . escitalopram (LEXAPRO) 20 MG tablet    Sig: Take 1 tablet (20 mg total) by mouth daily.    Dispense:  90 tablet    Refill:  1    Order Specific Question:  Supervising Provider    Answer:  Merlyn Albert [[id number]]  . metFORMIN (GLUCOPHAGE) 1000 MG tablet    Sig: Take 1 tablet (1,000 mg total) by mouth 2 (two) times daily with a meal.    Dispense:  180 tablet    Refill:  1    Order Specific Question:  Supervising Provider    Answer:  Riccardo Dubin   Encourage regular activity and continued weight loss. Recheck in 6 months, sooner if any problems.

## 2013-02-14 NOTE — Assessment & Plan Note (Signed)
.   diclofenac (VOLTAREN) 75 MG EC tablet    Sig: Take 1 tablet (75 mg total) by mouth 2 (two) times daily.    Dispense:  60 tablet    Refill:  1    Order Specific Question:  Supervising Provider    Answer:  Merlyn Albert [[id number]]  . escitalopram (LEXAPRO) 20 MG tablet    Sig: Take 1 tablet (20 mg total) by mouth daily.    Dispense:  90 tablet    Refill:  1    Order Specific Question:  Supervising Provider    Answer:  Merlyn Albert [[id number]]  . metFORMIN (GLUCOPHAGE) 1000 MG tablet    Sig: Take 1 tablet (1,000 mg total) by mouth 2 (two) times daily with a meal.    Dispense:  180 tablet    Refill:  1    Order Specific Question:  Supervising Provider    Answer:  Riccardo Dubin   Encourage regular activity and continued weight loss. Recheck in 6 months, sooner if any problems

## 2013-02-14 NOTE — Assessment & Plan Note (Signed)
.   diclofenac (VOLTAREN) 75 MG EC tablet    Sig: Take 1 tablet (75 mg total) by mouth 2 (two) times daily.    Dispense:  60 tablet    Refill:  1    Order Specific Question:  Supervising Provider    Answer:  LUKING, WILLIAM S [[id number]]  . escitalopram (LEXAPRO) 20 MG tablet    Sig: Take 1 tablet (20 mg total) by mouth daily.    Dispense:  90 tablet    Refill:  1    Order Specific Question:  Supervising Provider    Answer:  LUKING, WILLIAM S [[id number]]  . metFORMIN (GLUCOPHAGE) 1000 MG tablet    Sig: Take 1 tablet (1,000 mg total) by mouth 2 (two) times daily with a meal.    Dispense:  180 tablet    Refill:  1    Order Specific Question:  Supervising Provider    Answer:  LUKING, WILLIAM S [[id number]]   Encourage regular activity and continued weight loss. Recheck in 6 months, sooner if any problems 

## 2013-02-19 ENCOUNTER — Encounter: Payer: Self-pay | Admitting: Nurse Practitioner

## 2013-02-19 ENCOUNTER — Ambulatory Visit (INDEPENDENT_AMBULATORY_CARE_PROVIDER_SITE_OTHER): Payer: 59 | Admitting: Nurse Practitioner

## 2013-02-19 VITALS — BP 128/88 | Temp 98.2°F | Ht 66.0 in | Wt 189.0 lb

## 2013-02-19 DIAGNOSIS — J322 Chronic ethmoidal sinusitis: Secondary | ICD-10-CM

## 2013-02-19 DIAGNOSIS — J209 Acute bronchitis, unspecified: Secondary | ICD-10-CM

## 2013-02-19 MED ORDER — FLUCONAZOLE 150 MG PO TABS: ORAL_TABLET | ORAL | Status: DC

## 2013-02-19 MED ORDER — METHYLPREDNISOLONE ACETATE 40 MG/ML IJ SUSP
40.0000 mg | Freq: Once | INTRAMUSCULAR | Status: AC
  Administered 2013-02-19: 40 mg via INTRAMUSCULAR

## 2013-02-19 MED ORDER — AMOXICILLIN-POT CLAVULANATE 875-125 MG PO TABS: 1.0000 | ORAL_TABLET | Freq: Two times a day (BID) | ORAL | Status: DC

## 2013-02-19 MED ORDER — HYDROCODONE-HOMATROPINE 5-1.5 MG/5ML PO SYRP: 5.0000 mL | ORAL_SOLUTION | ORAL | Status: DC | PRN

## 2013-02-21 ENCOUNTER — Encounter: Payer: Self-pay | Admitting: Nurse Practitioner

## 2013-02-21 NOTE — Progress Notes (Signed)
Subjective:  Presents complaints of sore throat and head congestion for the past 6 days. Hot and cold at times. Ethmoid sinus area headache. Frequent nonproductive cough. No wheezing. Slight chest pain with coughing. Ear pain. Sore throat. Taking fluids well. Voiding normal limit. No relief with OTC meds.  Objective:   BP 128/88  Temp(Src) 98.2 F (36.8 C) (Oral)  Ht 5\' 6"  (1.676 m)  Wt 189 lb (85.73 kg)  BMI 30.52 kg/m2 NAD. Alert, oriented. TMs clear effusion, no erythema. Pharynx injected with green PND noted. Neck supple with mild soft nontender adenopathy. Lungs scattered faint expiratory crackles, no wheezing or tachypnea. Normal color. Heart regular rate rhythm.  Assessment:Ethmoid sinusitis - Plan: methylPREDNISolone acetate (DEPO-MEDROL) injection 40 mg  Acute bronchitis  Plan: Meds ordered this encounter  Medications  . amoxicillin-clavulanate (AUGMENTIN) 875-125 MG per tablet    Sig: Take 1 tablet by mouth 2 (two) times daily.    Dispense:  20 tablet    Refill:  0    Order Specific Question:  Supervising Provider    Answer:  Merlyn Albert [[id number]]  . HYDROcodone-homatropine (HYCODAN) 5-1.5 MG/5ML syrup    Sig: Take 5 mLs by mouth every 4 (four) hours as needed.    Dispense:  120 mL    Refill:  0    Order Specific Question:  Supervising Provider    Answer:  Merlyn Albert [[id number]]  . fluconazole (DIFLUCAN) 150 MG tablet    Sig: One po qd prn yeast infection; may repeat in 3-4 days if needed    Dispense:  2 tablet    Refill:  0    Order Specific Question:  Supervising Provider    Answer:  Merlyn Albert [[id number]]  . methylPREDNISolone acetate (DEPO-MEDROL) injection 40 mg    Sig:    OTC meds as directed. Reviewed warning signs. Call back by the end of the week if no improvement, sooner if worse.

## 2013-02-27 ENCOUNTER — Telehealth: Payer: Self-pay | Admitting: Family Medicine

## 2013-02-27 MED ORDER — AMOXICILLIN-POT CLAVULANATE 875-125 MG PO TABS: 1.0000 | ORAL_TABLET | Freq: Two times a day (BID) | ORAL | Status: DC

## 2013-02-27 MED ORDER — HYDROCODONE-HOMATROPINE 5-1.5 MG/5ML PO SYRP: 5.0000 mL | ORAL_SOLUTION | ORAL | Status: DC | PRN

## 2013-02-27 NOTE — Telephone Encounter (Signed)
Last antibiotic was Augmentin

## 2013-02-27 NOTE — Telephone Encounter (Signed)
Patient notified

## 2013-02-27 NOTE — Telephone Encounter (Signed)
Patient is going to finish her antibiotic tomorrow and is still having sore throat, head stopped up and ear stopped up, cough. She would like a refill of her antibiotic to [facility] and would like a refill of her cough medicine.

## 2013-02-27 NOTE — Telephone Encounter (Signed)
Ok, may ref both

## 2013-03-13 ENCOUNTER — Telehealth: Payer: Self-pay | Admitting: Family Medicine

## 2013-03-13 MED ORDER — FLUCONAZOLE 150 MG PO TABS: ORAL_TABLET | ORAL | Status: DC

## 2013-03-13 NOTE — Telephone Encounter (Signed)
Diflucan 150 one three d apart

## 2013-03-13 NOTE — Telephone Encounter (Signed)
Patient needs something for yeast infection that she contracted from antibiotic.  She wants this sent to [facility] and sent to [facility]

## 2013-03-13 NOTE — Telephone Encounter (Signed)
Medication sent to pharmacy. Left message on voicemail notifying patient.  

## 2013-03-19 ENCOUNTER — Other Ambulatory Visit: Payer: Self-pay | Admitting: Family Medicine

## 2013-03-19 NOTE — Telephone Encounter (Signed)
Last office visit 02-19-13

## 2013-03-30 ENCOUNTER — Ambulatory Visit (INDEPENDENT_AMBULATORY_CARE_PROVIDER_SITE_OTHER): Payer: 59 | Admitting: Family Medicine

## 2013-03-30 ENCOUNTER — Encounter: Payer: Self-pay | Admitting: Family Medicine

## 2013-03-30 VITALS — BP 114/84 | Temp 98.4°F | Ht 66.0 in | Wt 192.1 lb

## 2013-03-30 DIAGNOSIS — J329 Chronic sinusitis, unspecified: Secondary | ICD-10-CM

## 2013-03-30 DIAGNOSIS — J310 Chronic rhinitis: Secondary | ICD-10-CM

## 2013-03-30 MED ORDER — HYDROCODONE-HOMATROPINE 5-1.5 MG/5ML PO SYRP: 5.0000 mL | ORAL_SOLUTION | Freq: Every evening | ORAL | Status: DC | PRN

## 2013-03-30 MED ORDER — FLUTICASONE PROPIONATE 50 MCG/ACT NA SUSP: 2.0000 | Freq: Every day | NASAL | Status: DC

## 2013-03-30 MED ORDER — LEVOFLOXACIN 500 MG PO TABS: 500.0000 mg | ORAL_TABLET | Freq: Every day | ORAL | Status: AC

## 2013-03-30 MED ORDER — FLUCONAZOLE 150 MG PO TABS
ORAL_TABLET | ORAL | Status: DC
Start: 2013-03-30 — End: 2013-04-25

## 2013-03-30 NOTE — Progress Notes (Signed)
   Subjective:    Patient ID: [name], female    DOB: 26-Aug-1970, 43 y.o.   MRN: [id number]  Sinusitis This is a new problem. The current episode started 1 to 4 weeks ago. The problem has been gradually worsening since onset. There has been no fever. Her pain is at a severity of 5/10. The pain is moderate. Associated symptoms include congestion, ear pain, headaches and sinus pressure. Past treatments include oral decongestants and antibiotics. The treatment provided no relief.   started with fluid in the ear  Gunky and congested  Feeling sweats at times  Dr Ace Gins mon thru thur       Review of Systems  HENT: Positive for congestion, ear pain and sinus pressure.   Neurological: Positive for headaches.   no vomiting or diarrhea ROS otherwise negative     Objective:   Physical Exam  Alert mild malaise right TM some bulging with fluid. Maxillary and frontal tenderness. Pharynx slight erythema neck supple. Lungs clear. Heart regular in rhythm.      Assessment & Plan:  Impression rhinosinusitis and right otitis media plan reinitiate Flonase. Levaquin daily for 10 days. Diflucan.. Symptomatic care discussed. WSL

## 2013-04-25 ENCOUNTER — Encounter: Payer: Self-pay | Admitting: Nurse Practitioner

## 2013-04-25 ENCOUNTER — Ambulatory Visit (INDEPENDENT_AMBULATORY_CARE_PROVIDER_SITE_OTHER): Payer: 59 | Admitting: Nurse Practitioner

## 2013-04-25 VITALS — BP 130/72 | Temp 98.2°F | Ht 66.0 in | Wt 196.0 lb

## 2013-04-25 DIAGNOSIS — H9201 Otalgia, right ear: Secondary | ICD-10-CM

## 2013-04-25 DIAGNOSIS — J309 Allergic rhinitis, unspecified: Secondary | ICD-10-CM

## 2013-04-25 DIAGNOSIS — J300 Vasomotor rhinitis: Secondary | ICD-10-CM

## 2013-04-25 DIAGNOSIS — H9209 Otalgia, unspecified ear: Secondary | ICD-10-CM

## 2013-04-25 MED ORDER — METHYLPREDNISOLONE ACETATE 40 MG/ML IJ SUSP
40.0000 mg | Freq: Once | INTRAMUSCULAR | Status: AC
  Administered 2013-04-25: 40 mg via INTRAMUSCULAR

## 2013-04-25 MED ORDER — ANTIPYRINE-BENZOCAINE 5.4-1.4 % OT SOLN: 3.0000 [drp] | Freq: Four times a day (QID) | OTIC | Status: DC | PRN

## 2013-04-25 NOTE — Progress Notes (Signed)
Subjective:  Presents for c/o right ear pain that began yesterday. No fever, cough, runny nose, wheezing or headache. Mild head congestion. Currently on Flonase and antihistamine.  Objective:   BP 130/72  Temp(Src) 98.2 F (36.8 C) (Oral)  Ht 5\' 6"  (1.676 m)  Wt 196 lb (88.905 kg)  BMI 31.65 kg/m2 NAD. Alert, oriented. TMs significant cloudy effusion, no erythema. Pharynx clear. Heart RRR.   Assessment: Vasomotor rhinitis - Plan: methylPREDNISolone acetate (DEPO-MEDROL) injection 40 mg  Right ear pain - Plan: methylPREDNISolone acetate (DEPO-MEDROL) injection 40 mg  Plan:  Meds ordered this encounter  Medications  . methylPREDNISolone acetate (DEPO-MEDROL) injection 40 mg    Sig:   . antipyrine-benzocaine (AURALGAN) otic solution    Sig: Place 3-4 drops into the right ear 4 (four) times daily as needed for ear pain.    Dispense:  10 mL    Refill:  0    Order Specific Question:  Supervising Provider    Answer:  Mikey Kirschner [[id number]]    Continue other meds. Call back in 48 hours if no better.

## 2013-04-26 ENCOUNTER — Telehealth: Payer: Self-pay | Admitting: Family Medicine

## 2013-04-26 MED ORDER — PREDNISONE 20 MG PO TABS: ORAL_TABLET | ORAL | Status: DC

## 2013-04-26 NOTE — Telephone Encounter (Signed)
Patient said that the depo medrol shot did not help her with her ears yesterday and [name] has mentioned that if it didn't she could call in prednisone for her. Patient would like this prednisone called in if anyway possible today.   [facility].

## 2013-04-26 NOTE — Telephone Encounter (Signed)
Patient notified and verbalized understanding. 

## 2013-04-26 NOTE — Telephone Encounter (Signed)
pred 20 mg tab eoghteenthree qd for three d two qd for three d one qd for two d

## 2013-06-11 ENCOUNTER — Other Ambulatory Visit: Payer: Self-pay | Admitting: Family Medicine

## 2013-06-11 NOTE — Telephone Encounter (Signed)
Last seen 04/25/13 (sick)

## 2013-06-11 NOTE — Telephone Encounter (Signed)
Ok may fill monthly plus three ref

## 2013-06-15 ENCOUNTER — Other Ambulatory Visit: Payer: Self-pay | Admitting: *Deleted

## 2013-06-15 MED ORDER — ALPRAZOLAM 0.5 MG PO TABS: 0.5000 mg | ORAL_TABLET | Freq: Two times a day (BID) | ORAL | Status: DC | PRN

## 2013-06-22 ENCOUNTER — Ambulatory Visit (INDEPENDENT_AMBULATORY_CARE_PROVIDER_SITE_OTHER): Payer: 59 | Admitting: Nurse Practitioner

## 2013-06-22 ENCOUNTER — Encounter: Payer: Self-pay | Admitting: Nurse Practitioner

## 2013-06-22 VITALS — BP 122/80 | Ht 66.0 in | Wt 192.0 lb

## 2013-06-22 DIAGNOSIS — K589 Irritable bowel syndrome without diarrhea: Secondary | ICD-10-CM

## 2013-06-22 DIAGNOSIS — M545 Low back pain, unspecified: Secondary | ICD-10-CM

## 2013-06-22 DIAGNOSIS — J309 Allergic rhinitis, unspecified: Secondary | ICD-10-CM

## 2013-06-22 DIAGNOSIS — J300 Vasomotor rhinitis: Secondary | ICD-10-CM

## 2013-06-22 DIAGNOSIS — S39012A Strain of muscle, fascia and tendon of lower back, initial encounter: Secondary | ICD-10-CM

## 2013-06-22 DIAGNOSIS — K581 Irritable bowel syndrome with constipation: Secondary | ICD-10-CM

## 2013-06-22 MED ORDER — DICLOFENAC EPOLAMINE 1.3 % TD PTCH: MEDICATED_PATCH | TRANSDERMAL | Status: DC

## 2013-06-22 MED ORDER — FLUTICASONE PROPIONATE 50 MCG/ACT NA SUSP: 2.0000 | Freq: Every day | NASAL | Status: DC

## 2013-06-22 MED ORDER — METFORMIN HCL 1000 MG PO TABS: 1000.0000 mg | ORAL_TABLET | Freq: Two times a day (BID) | ORAL | Status: DC

## 2013-06-25 ENCOUNTER — Encounter: Payer: Self-pay | Admitting: Nurse Practitioner

## 2013-06-25 DIAGNOSIS — K581 Irritable bowel syndrome with constipation: Secondary | ICD-10-CM | POA: Insufficient documentation

## 2013-06-25 NOTE — Progress Notes (Signed)
Subjective:  Presents to discuss her IBS. Usually has constipation with a stool every 3-4 days. Much improved while on Metformin. Misunderstanding about purpose of metformin. Side effect of diarrhea has helped her constipation. Also complaints of mild head congestion with fluid and pressure in the right ear. No fever. Minimal cough. No headache. Has a history of off-and-on low back strain, was given a sample of diclofenac patch where she works which has worked extremely well. Requesting refills.  Objective:   BP 122/80  Ht 5\' 6"  (1.676 m)  Wt 192 lb (87.091 kg)  BMI 31.00 kg/m2 NAD. Alert, oriented. TMs significant clear effusion slightly more on the right side, no erythema. Pharynx clear. Neck supple with mild soft anterior adenopathy. Lungs clear. Heart regular rate rhythm. Abdomen soft nondistended nontender. SLR negative bilaterally. Reflexes normal limit. Gait normal limit.  Assessment:  Problem List Items Addressed This Visit   None    Visit Diagnoses   Low back strain    -  Primary    Vasomotor rhinitis        Irritable bowel syndrome with constipation          Plan: Continue Flonase as directed. OTC antihistamines as directed. Meds ordered this encounter  Medications  . DISCONTD: diclofenac (FLECTOR) 1.3 % PTCH    Sig: Place 1 patch onto the skin 2 (two) times daily.  . diclofenac (FLECTOR) 1.3 % PTCH    Sig: Apply as directed q 12 hours prn pain    Dispense:  60 patch    Refill:  0    Order Specific Question:  Supervising Provider    Answer:  Mikey Kirschner [[id number]]  . fluticasone (FLONASE) 50 MCG/ACT nasal spray    Sig: Place 2 sprays into both nostrils daily.    Dispense:  48 g    Refill:  3    Order Specific Question:  Supervising Provider    Answer:  Mikey Kirschner [[id number]]  . metFORMIN (GLUCOPHAGE) 1000 MG tablet    Sig: Take 1 tablet (1,000 mg total) by mouth 2 (two) times daily with a meal.    Dispense:  180 tablet    Refill:  1    Order Specific Question:   Supervising Provider    Answer:  Mikey Kirschner [[id number]]   Do not recommend any further medications for her IBS at this point since metformin is helping and is very inexpensive. Return if symptoms worsen or fail to improve.

## 2013-07-19 ENCOUNTER — Other Ambulatory Visit: Payer: Self-pay | Admitting: *Deleted

## 2013-07-19 ENCOUNTER — Other Ambulatory Visit: Payer: Self-pay | Admitting: Nurse Practitioner

## 2013-07-19 MED ORDER — DICLOFENAC SODIUM 75 MG PO TBEC: DELAYED_RELEASE_TABLET | ORAL | Status: DC

## 2013-10-12 ENCOUNTER — Ambulatory Visit (INDEPENDENT_AMBULATORY_CARE_PROVIDER_SITE_OTHER): Payer: 59 | Admitting: Family Medicine

## 2013-10-12 ENCOUNTER — Encounter: Payer: Self-pay | Admitting: Family Medicine

## 2013-10-12 VITALS — BP 110/82 | Temp 98.1°F | Ht 66.0 in | Wt 198.0 lb

## 2013-10-12 DIAGNOSIS — N10 Acute tubulo-interstitial nephritis: Secondary | ICD-10-CM

## 2013-10-12 DIAGNOSIS — R319 Hematuria, unspecified: Secondary | ICD-10-CM

## 2013-10-12 DIAGNOSIS — K219 Gastro-esophageal reflux disease without esophagitis: Secondary | ICD-10-CM

## 2013-10-12 LAB — POCT URINALYSIS DIPSTICK
Blood, UA: 10
PROTEIN UA: 30
SPEC GRAV UA: 1.02
pH, UA: 5

## 2013-10-12 MED ORDER — OMEPRAZOLE 20 MG PO CPDR: 20.0000 mg | DELAYED_RELEASE_CAPSULE | Freq: Every day | ORAL | Status: DC

## 2013-10-12 MED ORDER — HYDROCODONE-ACETAMINOPHEN 5-325 MG PO TABS: ORAL_TABLET | ORAL | Status: DC

## 2013-10-12 MED ORDER — ONDANSETRON 4 MG PO TBDP: 4.0000 mg | ORAL_TABLET | Freq: Four times a day (QID) | ORAL | Status: DC | PRN

## 2013-10-12 MED ORDER — CIPROFLOXACIN HCL 500 MG PO TABS: 500.0000 mg | ORAL_TABLET | Freq: Two times a day (BID) | ORAL | Status: DC

## 2013-10-12 NOTE — Patient Instructions (Signed)
Gastroesophageal Reflux Disease, Adult Gastroesophageal reflux disease (GERD) happens when acid from your stomach flows up into the esophagus. When acid comes in contact with the esophagus, the acid causes soreness (inflammation) in the esophagus. Over time, GERD may create small holes (ulcers) in the lining of the esophagus. CAUSES   Increased body weight. This puts pressure on the stomach, making acid rise from the stomach into the esophagus.  Smoking. This increases acid production in the stomach.  Drinking alcohol. This causes decreased pressure in the lower esophageal sphincter (valve or ring of muscle between the esophagus and stomach), allowing acid from the stomach into the esophagus.  Late evening meals and a full stomach. This increases pressure and acid production in the stomach.  A malformed lower esophageal sphincter. Sometimes, no cause is found. SYMPTOMS   Burning pain in the lower part of the mid-chest behind the breastbone and in the mid-stomach area. This may occur twice a week or more often.  Trouble swallowing.  Sore throat.  Dry cough.  Asthma-like symptoms including chest tightness, shortness of breath, or wheezing. DIAGNOSIS  Your caregiver may be able to diagnose GERD based on your symptoms. In some cases, X-rays and other tests may be done to check for complications or to check the condition of your stomach and esophagus. TREATMENT  Your caregiver may recommend over-the-counter or prescription medicines to help decrease acid production. Ask your caregiver before starting or adding any new medicines.  HOME CARE INSTRUCTIONS   Change the factors that you can control. Ask your caregiver for guidance concerning weight loss, quitting smoking, and alcohol consumption.  Avoid foods and drinks that make your symptoms worse, such as:  Caffeine or alcoholic drinks.  Chocolate.  Peppermint or mint flavorings.  Garlic and onions.  Spicy foods.  Citrus fruits,  such as oranges, lemons, or limes.  Tomato-based foods such as sauce, chili, salsa, and pizza.  Fried and fatty foods.  Avoid lying down for the 3 hours prior to your bedtime or prior to taking a nap.  Eat small, frequent meals instead of large meals.  Wear loose-fitting clothing. Do not wear anything tight around your waist that causes pressure on your stomach.  Raise the head of your bed 6 to 8 inches with wood blocks to help you sleep. Extra pillows will not help.  Only take over-the-counter or prescription medicines for pain, discomfort, or fever as directed by your caregiver.  Do not take aspirin, ibuprofen, or other nonsteroidal anti-inflammatory drugs (NSAIDs). SEEK IMMEDIATE MEDICAL CARE IF:   You have pain in your arms, neck, jaw, teeth, or back.  Your pain increases or changes in intensity or duration.  You develop nausea, vomiting, or sweating (diaphoresis).  You develop shortness of breath, or you faint.  Your vomit is green, yellow, black, or looks like coffee grounds or blood.  Your stool is red, bloody, or black. These symptoms could be signs of other problems, such as heart disease, gastric bleeding, or esophageal bleeding. MAKE SURE YOU:   Understand these instructions.  Will watch your condition.  Will get help right away if you are not doing well or get worse. Document Released: 11/18/2004 Document Revised: 05/03/2011 Document Reviewed: 08/28/2010 ExitCare Patient Information 2015 [employer]. This information is not intended to replace advice given to you by your health care provider. Make sure you discuss any questions you have with your health care provider.  

## 2013-10-12 NOTE — Progress Notes (Signed)
   Subjective:    Patient ID: [name], female    DOB: [birth year], 43 y.o.   MRN: [id number]  Urinary Tract Infection  This is a new problem. The current episode started in the past 7 days. The problem occurs intermittently. The problem has been unchanged. The pain is moderate. There has been no fever. Associated symptoms include frequency. Associated symptoms comments: Back pain. She has tried acetaminophen for the symptoms. The treatment provided no relief.  Sore Throat  This is a new problem. The current episode started more than 1 month ago. The problem has been unchanged. Neither side of throat is experiencing more pain than the other. There has been no fever. The pain is moderate. Associated symptoms comments: Belching, bad taste in mouth. She has tried nothing for the symptoms. The treatment provided no relief.   Last wk dysuria tarted, freq urination, trouble with urination.  Low back pain left side, severe,  Not responding to tyl   No fever  Feels a bit queazzy  Belching burpin, worse after meals   more in the past mo  Results for orders placed in visit on 10/12/13  POCT URINALYSIS DIPSTICK      Result Value Ref Range   Color, UA       Clarity, UA       Glucose, UA       Bilirubin, UA +     Ketones, UA       Spec Grav, UA 1.020     Blood, UA 10     pH, UA 5.0     Protein, UA 30     Urobilinogen, UA       Nitrite, UA       Leukocytes, UA small (1+)         Review of Systems  Genitourinary: Positive for frequency.   Possible low-grade fever no major vomiting some nausea    Objective:   Physical Exam Alert mild malaise vital stable. Lungs clear. Heart rare rhythm. Left CVA tenderness. Donald exam benign.       Assessment & Plan:  Impression 1 pyelonephritis discussed. #2 reflux worsening discuss. Plan antibiotics prescribed. Hydrocodone when necessary. Initiate reflux medicine. Rationale discussed. Diet discussed. Educational information given. No  further recommendations regarding reflux at this time. Warning signs regarding UTIs discussed.

## 2013-10-13 DIAGNOSIS — N10 Acute tubulo-interstitial nephritis: Secondary | ICD-10-CM | POA: Insufficient documentation

## 2013-10-13 DIAGNOSIS — K219 Gastro-esophageal reflux disease without esophagitis: Secondary | ICD-10-CM | POA: Insufficient documentation

## 2013-10-22 ENCOUNTER — Ambulatory Visit (INDEPENDENT_AMBULATORY_CARE_PROVIDER_SITE_OTHER): Payer: 59 | Admitting: Family Medicine

## 2013-10-22 ENCOUNTER — Encounter: Payer: Self-pay | Admitting: Family Medicine

## 2013-10-22 ENCOUNTER — Telehealth: Payer: Self-pay | Admitting: Family Medicine

## 2013-10-22 VITALS — BP 128/84 | Temp 98.8°F | Ht 65.0 in | Wt 197.0 lb

## 2013-10-22 DIAGNOSIS — R300 Dysuria: Secondary | ICD-10-CM

## 2013-10-22 LAB — POCT URINALYSIS DIPSTICK
Spec Grav, UA: 1.015
pH, UA: 5

## 2013-10-22 MED ORDER — FLUCONAZOLE 150 MG PO TABS: ORAL_TABLET | ORAL | Status: DC

## 2013-10-22 MED ORDER — HYDROCODONE-ACETAMINOPHEN 5-325 MG PO TABS: ORAL_TABLET | ORAL | Status: DC

## 2013-10-22 NOTE — Telephone Encounter (Signed)
Can we refill her pain meds again  HYDROcodone-acetaminophen (NORCO/VICODIN) 5-325 MG per tablet

## 2013-10-22 NOTE — Telephone Encounter (Signed)
Pt still has frequent urination, still has pain when urinating  No fever, still some back pain but not like it was.  Now has a foul smell to it now.   Was seen 10/12/13 Issued ciprofloxacin (CIPRO) 500 MG tablet  Out patient pharm

## 2013-10-22 NOTE — Progress Notes (Signed)
   Subjective:    Patient ID: [name], female    DOB: 12/28/1970, 43 y.o.   MRN: [id number]  Dysuria  This is a new problem. The current episode started 1 to 4 weeks ago. Associated symptoms include flank pain and urgency.  Finished cipro 500mg  yesterday. Feeling better.  Still having some symptoms. Leaving Saturday for a 2 week vacation.    Some dysuria. Some increase frequency. Compliant with meds.  Notes some vaginal discharge. Review of Systems  Genitourinary: Positive for dysuria, urgency and flank pain.   No fever no chills no back pain ROS otherwise negative    Objective:   Physical Exam  Alert no apparent distress lungs clear heart regular in rhythm. No CVA tenderness. Low abdomen nontender.  Urinalysis and frequent yeast with budding      Assessment & Plan:  Impression yeast vaginitis post antibiotics for UTI. Plan Diflucan prescribed. Symptomatic care discussed. WSL

## 2013-10-22 NOTE — Telephone Encounter (Signed)
If bad enough for narcotics, bad enough to be seen in off, next appt

## 2013-10-22 NOTE — Telephone Encounter (Signed)
Office visit scheduled.

## 2013-10-23 ENCOUNTER — Other Ambulatory Visit: Payer: Self-pay | Admitting: Family Medicine

## 2013-10-23 NOTE — Telephone Encounter (Signed)
Ok plus three monthly ref 

## 2013-11-22 ENCOUNTER — Other Ambulatory Visit: Payer: Self-pay | Admitting: Nurse Practitioner

## 2013-12-05 ENCOUNTER — Telehealth: Payer: Self-pay | Admitting: Nurse Practitioner

## 2013-12-05 ENCOUNTER — Other Ambulatory Visit: Payer: Self-pay | Admitting: Nurse Practitioner

## 2013-12-05 MED ORDER — DICLOFENAC EPOLAMINE 1.3 % TD PTCH: MEDICATED_PATCH | TRANSDERMAL | Status: DC

## 2013-12-05 NOTE — Telephone Encounter (Signed)
Rx sent in. She may want to call and specify if she wants name brand because of coupon.  I sent a message to pharmacy.

## 2013-12-05 NOTE — Telephone Encounter (Signed)
Med sent to pharm. Pt notified on voicemail.  

## 2013-12-05 NOTE — Telephone Encounter (Signed)
Patient needs Rx for Flector Patches to [facility]. She has a coupon for $20 copay.

## 2014-02-20 ENCOUNTER — Other Ambulatory Visit: Payer: Self-pay | Admitting: Family Medicine

## 2014-02-20 NOTE — Telephone Encounter (Signed)
Seen 8/31

## 2014-02-26 ENCOUNTER — Other Ambulatory Visit: Payer: Self-pay | Admitting: *Deleted

## 2014-02-26 NOTE — Telephone Encounter (Signed)
Ok plus two ref 

## 2014-03-12 ENCOUNTER — Other Ambulatory Visit: Payer: Self-pay | Admitting: Family Medicine

## 2014-03-12 NOTE — Telephone Encounter (Signed)
[place]catur 90 d f u for chronic visit bef finished

## 2014-04-05 ENCOUNTER — Encounter: Payer: Self-pay | Admitting: Nurse Practitioner

## 2014-04-05 ENCOUNTER — Ambulatory Visit (INDEPENDENT_AMBULATORY_CARE_PROVIDER_SITE_OTHER): Payer: 59 | Admitting: Nurse Practitioner

## 2014-04-05 VITALS — BP 130/90 | Ht 65.0 in | Wt 202.5 lb

## 2014-04-05 DIAGNOSIS — R7301 Impaired fasting glucose: Secondary | ICD-10-CM

## 2014-04-05 DIAGNOSIS — K219 Gastro-esophageal reflux disease without esophagitis: Secondary | ICD-10-CM

## 2014-04-05 DIAGNOSIS — F418 Other specified anxiety disorders: Secondary | ICD-10-CM

## 2014-04-05 DIAGNOSIS — F329 Major depressive disorder, single episode, unspecified: Secondary | ICD-10-CM

## 2014-04-05 DIAGNOSIS — F419 Anxiety disorder, unspecified: Secondary | ICD-10-CM

## 2014-04-05 DIAGNOSIS — F32A Depression, unspecified: Secondary | ICD-10-CM

## 2014-04-05 DIAGNOSIS — E6609 Other obesity due to excess calories: Secondary | ICD-10-CM | POA: Insufficient documentation

## 2014-04-05 LAB — POCT GLYCOSYLATED HEMOGLOBIN (HGB A1C): HEMOGLOBIN A1C: 5.1

## 2014-04-05 MED ORDER — CYCLOBENZAPRINE HCL 10 MG PO TABS: ORAL_TABLET | ORAL | Status: DC

## 2014-04-05 MED ORDER — ESCITALOPRAM OXALATE 20 MG PO TABS: 20.0000 mg | ORAL_TABLET | Freq: Every day | ORAL | Status: DC

## 2014-04-05 MED ORDER — OMEPRAZOLE 20 MG PO CPDR: 20.0000 mg | DELAYED_RELEASE_CAPSULE | Freq: Every day | ORAL | Status: DC

## 2014-04-05 MED ORDER — METFORMIN HCL 1000 MG PO TABS: 1000.0000 mg | ORAL_TABLET | Freq: Two times a day (BID) | ORAL | Status: DC

## 2014-04-05 NOTE — Progress Notes (Signed)
Subjective:  Presents for routine follow up. Gets regular preventive health physicals with GYN. Labs done in June. Forgot to bring copies today. Increased Lexapro to 20 mg. Doing well on this dose. Using Xanax for sleep. Has not done as well lately with diet and exercise. Reflux stable.  Objective:   BP 130/90 mmHg  Ht 5\' 5"  (1.651 m)  Wt 202 lb 8 oz (91.853 kg)  BMI 33.70 kg/m2 NAD. Alert, oriented. Lungs clear. Heart RRR. Abdomen soft, non tender. Significant central obesity. Results for orders placed or performed in visit on 04/05/14  POCT glycosylated hemoglobin (Hb A1C)  Result Value Ref Range   Hemoglobin A1C 5.1      Assessment:  Problem List Items Addressed This Visit      Digestive   Esophageal reflux   Relevant Medications   omeprazole (PRILOSEC) capsule     Endocrine   Impaired fasting glucose - Primary   Relevant Orders   POCT glycosylated hemoglobin (Hb A1C) (Completed)     Other   Anxiety and depression   Morbid obesity   Relevant Medications   metFORMIN (GLUCOPHAGE) tablet       Plan:  Meds ordered this encounter  Medications  . cyclobenzaprine (FLEXERIL) 10 MG tablet    Sig: TAKE 1 TABLET BY MOUTH AT BEDTIME AS NEEDED FOR MUSCLE SPASMS.    Dispense:  90 tablet    Refill:  1    Order Specific Question:  Supervising Provider    Answer:  Mikey Kirschner [[id number]]  . escitalopram (LEXAPRO) 20 MG tablet    Sig: Take 1 tablet (20 mg total) by mouth daily.    Dispense:  90 tablet    Refill:  1    Order Specific Question:  Supervising Provider    Answer:  Mikey Kirschner [[id number]]  . metFORMIN (GLUCOPHAGE) 1000 MG tablet    Sig: Take 1 tablet (1,000 mg total) by mouth 2 (two) times daily with a meal.    Dispense:  180 tablet    Refill:  1    Order Specific Question:  Supervising Provider    Answer:  Mikey Kirschner [[id number]]  . omeprazole (PRILOSEC) 20 MG capsule    Sig: Take 1 capsule (20 mg total) by mouth daily.    Dispense:  90 capsule    Refill:   1    Order Specific Question:  Supervising Provider    Answer:  Mikey Kirschner [[id number]]   Encouraged healthy diet, regular activity and weight loss. Recommend [employer] or similar program.  Return in about 6 months (around 10/04/2014) for recheck.

## 2014-07-11 ENCOUNTER — Telehealth: Payer: Self-pay | Admitting: Nurse Practitioner

## 2014-07-11 NOTE — Telephone Encounter (Signed)
Pt not sure or concerned the lexapro is still working for her Wants to know if she should increase it or try something else  She feels as though she is more irritable, less tolerant of things,  Very moody   Please advise   [facility]

## 2014-07-12 ENCOUNTER — Telehealth: Payer: Self-pay | Admitting: Nurse Practitioner

## 2014-07-12 NOTE — Telephone Encounter (Signed)
Recommend office visit to discuss options 

## 2014-07-12 NOTE — Telephone Encounter (Signed)
Left message on voicemail notifing pt that she needs ov to discuss options.

## 2014-07-12 NOTE — Telephone Encounter (Signed)
noted 

## 2014-07-12 NOTE — Telephone Encounter (Signed)
Pt called back to state that she is at a new job an in a 52 day  Window of probation and is unable to come in at this time. She will Just deal with things as they are an see [name] in Aug as scheduled

## 2014-07-15 ENCOUNTER — Encounter: Payer: Self-pay | Admitting: Family Medicine

## 2014-07-15 ENCOUNTER — Ambulatory Visit (INDEPENDENT_AMBULATORY_CARE_PROVIDER_SITE_OTHER): Payer: 59 | Admitting: Family Medicine

## 2014-07-15 VITALS — BP 138/94 | Ht 65.0 in | Wt 211.0 lb

## 2014-07-15 DIAGNOSIS — K219 Gastro-esophageal reflux disease without esophagitis: Secondary | ICD-10-CM

## 2014-07-15 DIAGNOSIS — IMO0001 Reserved for inherently not codable concepts without codable children: Secondary | ICD-10-CM | POA: Insufficient documentation

## 2014-07-15 DIAGNOSIS — H1131 Conjunctival hemorrhage, right eye: Secondary | ICD-10-CM | POA: Diagnosis not present

## 2014-07-15 DIAGNOSIS — F419 Anxiety disorder, unspecified: Secondary | ICD-10-CM

## 2014-07-15 DIAGNOSIS — F418 Other specified anxiety disorders: Secondary | ICD-10-CM

## 2014-07-15 DIAGNOSIS — R030 Elevated blood-pressure reading, without diagnosis of hypertension: Secondary | ICD-10-CM

## 2014-07-15 DIAGNOSIS — F329 Major depressive disorder, single episode, unspecified: Secondary | ICD-10-CM

## 2014-07-15 NOTE — Progress Notes (Signed)
   Subjective:    Patient ID: [name], female    DOB: January 31, 1971, 44 y.o.   MRN: [id number]  HPIWants blood pressure checked because of busted blood vessell in eye. Also having some nose bleeds.  Wants to discuss changing lexapro. Pt thinks its not helping. She is gaining weight and  having more stress.  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 Then patient lost that job when not enough business Lexapro did fine before then.   Review of Systems No chest pain no headache and back pain no change in bowel habits    Objective:   Physical Exam  Alert vital stable blood pressure 132/88 on repeat lungs clear heart regular in rhythm right scleral hemorrhage evident      Assessment & Plan:  Impression 1 scleral hemorrhage #2 borderline blood pressure #3 chronic stress discussed at length with aggravation of insomnia plan diet exercise discussed. Cut down salt intake. Would maintain same dose of Lexapro. Local measures for eye discussed. Hold off on medication for blood pressure at this time. WSL

## 2014-07-25 ENCOUNTER — Other Ambulatory Visit: Payer: Self-pay | Admitting: Family Medicine

## 2014-07-25 NOTE — Telephone Encounter (Signed)
Ok plus 2 monthly ref 

## 2014-08-09 ENCOUNTER — Other Ambulatory Visit: Payer: Self-pay | Admitting: Obstetrics and Gynecology

## 2014-08-12 LAB — CYTOLOGY - PAP

## 2014-08-21 ENCOUNTER — Encounter: Payer: Self-pay | Admitting: Family Medicine

## 2014-08-21 ENCOUNTER — Ambulatory Visit (INDEPENDENT_AMBULATORY_CARE_PROVIDER_SITE_OTHER): Payer: 59 | Admitting: Family Medicine

## 2014-08-21 VITALS — BP 130/82 | Ht 65.0 in | Wt 202.6 lb

## 2014-08-21 DIAGNOSIS — R21 Rash and other nonspecific skin eruption: Secondary | ICD-10-CM | POA: Diagnosis not present

## 2014-08-21 MED ORDER — PREDNISONE 20 MG PO TABS: ORAL_TABLET | ORAL | Status: DC

## 2014-08-21 NOTE — Progress Notes (Signed)
   Subjective:    Patient ID: [name], female    DOB: 23-Mar-1970, 44 y.o.   MRN: [id number]  HPI  Patient arrives with complaint of rash in private area after urinating in the woods over the weekend.  No fever no chills.  No cough or congestion.  Patient thought she had poison oak or poison ivy. Next   Discrete small erythematous bumps distributed through panties area Discrete Review of Systems No vomiting no diarrhea    Objective:   Physical Exam  Alert vitals stable. Lungs clear. Heart regular in rhythm. Distinct erythematous papules panties line low back      Assessment & Plan:  Impression probable bug bite such as chigger bites. Very severe pruritus affecting patient immensely she would like systemic therapy. Discussed pros and cons plan prednisone taper. Local measures and avoidance measures discussed WSL

## 2014-10-04 ENCOUNTER — Ambulatory Visit: Payer: 59 | Admitting: Nurse Practitioner

## 2014-10-14 ENCOUNTER — Other Ambulatory Visit: Payer: Self-pay | Admitting: Nurse Practitioner

## 2014-10-29 ENCOUNTER — Ambulatory Visit (INDEPENDENT_AMBULATORY_CARE_PROVIDER_SITE_OTHER): Payer: 59 | Admitting: Nurse Practitioner

## 2014-10-29 ENCOUNTER — Encounter: Payer: Self-pay | Admitting: Family Medicine

## 2014-10-29 ENCOUNTER — Other Ambulatory Visit: Payer: Self-pay | Admitting: Family Medicine

## 2014-10-29 ENCOUNTER — Encounter: Payer: Self-pay | Admitting: Nurse Practitioner

## 2014-10-29 VITALS — BP 136/84 | Ht 65.0 in | Wt 206.5 lb

## 2014-10-29 DIAGNOSIS — E559 Vitamin D deficiency, unspecified: Secondary | ICD-10-CM | POA: Diagnosis not present

## 2014-10-29 DIAGNOSIS — K219 Gastro-esophageal reflux disease without esophagitis: Secondary | ICD-10-CM | POA: Diagnosis not present

## 2014-10-29 DIAGNOSIS — R7301 Impaired fasting glucose: Secondary | ICD-10-CM

## 2014-10-29 DIAGNOSIS — J300 Vasomotor rhinitis: Secondary | ICD-10-CM | POA: Diagnosis not present

## 2014-10-29 MED ORDER — ALPRAZOLAM 0.5 MG PO TABS: 0.5000 mg | ORAL_TABLET | Freq: Two times a day (BID) | ORAL | Status: DC | PRN

## 2014-10-29 MED ORDER — METFORMIN HCL 1000 MG PO TABS: ORAL_TABLET | ORAL | Status: DC

## 2014-10-29 MED ORDER — NALTREXONE-BUPROPION HCL ER 8-90 MG PO TB12: ORAL_TABLET | ORAL | Status: DC

## 2014-10-29 MED ORDER — OMEGA-3-ACID ETHYL ESTERS 1 G PO CAPS: ORAL_CAPSULE | ORAL | Status: DC

## 2014-10-29 MED ORDER — OMEPRAZOLE 20 MG PO CPDR: 20.0000 mg | DELAYED_RELEASE_CAPSULE | Freq: Every day | ORAL | Status: DC

## 2014-10-29 MED ORDER — METHYLPREDNISOLONE ACETATE 40 MG/ML IJ SUSP
40.0000 mg | Freq: Once | INTRAMUSCULAR | Status: AC
  Administered 2014-10-29: 40 mg via INTRAMUSCULAR

## 2014-10-29 NOTE — Patient Instructions (Addendum)
Omega 3 Weight loss 5-10 lbs

## 2014-10-29 NOTE — Progress Notes (Signed)
Subjective:  Presents for routine follow-up. Has brought her most recent lab work from her gynecologist with her today. Currently on vitamin D for deficiency. Limited exercise. Has tried to do better with her diet. Recently had a vision exam. No chest pain/ischemic type pain or shortness of breath. Reflux stable on omeprazole. Has seen gynecology for hot flashes and flushes. Continues to experience some fatigue. Has weaned herself off Lexapro mainly due to sexual side effects. Would like to try a different medication to help her lose weight. Also complaints of ear pressure. Head congestion. Minimal cough. No fever. Off-and-on sinus pressure.  Objective:   BP 136/84 mmHg  Ht 5\' 5"  (1.651 m)  Wt 206 lb 8 oz (93.668 kg)  BMI 34.36 kg/m2 NAD. Alert, oriented. TMs clear effusion bilateral, no erythema. Pharynx nonerythematous with cloudy PND noted. Nasal mucosa pale and boggy. Neck supple with mild soft anterior adenopathy. Lungs clear. Heart regular rate rhythm. Abdomen soft nondistended nontender. See scanned lab reports.  Assessment:  Problem List Items Addressed This Visit      Digestive   Esophageal reflux   Relevant Medications   omeprazole (PRILOSEC) 20 MG capsule     Endocrine   Impaired fasting glucose - Primary     Other   Morbid obesity   Relevant Medications   Naltrexone-Bupropion HCl ER (CONTRAVE) 8-90 MG TB12   metFORMIN (GLUCOPHAGE) 1000 MG tablet   Naltrexone-Bupropion HCl ER (CONTRAVE) 8-90 MG TB12   Vitamin D deficiency    Other Visit Diagnoses    Vasomotor rhinitis        Relevant Medications    methylPREDNISolone acetate (DEPO-MEDROL) injection 40 mg (Completed)        Plan:  Meds ordered this encounter  Medications  . Cholecalciferol (VITAMIN D3) 5000 UNITS TABS    Sig: Take by mouth.  . omega-3 acid ethyl esters (LOVAZA) 1 G capsule    Sig: 2 po BID for cholesterol    Dispense:  120 capsule    Refill:  3    Order Specific Question:  Supervising Provider     Answer:  Mikey Kirschner [[id number]]  . Naltrexone-Bupropion HCl ER (CONTRAVE) 8-90 MG TB12    Sig: One po qam x 7 d then one po BID x 7 d then one po qam 2 po qpm then 2 po BID    Dispense:  70 tablet    Refill:  0    Order Specific Question:  Supervising Provider    Answer:  Mikey Kirschner [[id number]]  . DISCONTD: Naltrexone-Bupropion HCl ER (CONTRAVE) 8-90 MG TB12    Sig: 2 po BID    Dispense:  120 tablet    Refill:  1    Order Specific Question:  Supervising Provider    Answer:  Mikey Kirschner [[id number]]  . ALPRAZolam (XANAX) 0.5 MG tablet    Sig: Take 1 tablet (0.5 mg total) by mouth 2 (two) times daily as needed.    Dispense:  60 tablet    Refill:  2    Order Specific Question:  Supervising Provider    Answer:  Mikey Kirschner [[id number]]  . metFORMIN (GLUCOPHAGE) 1000 MG tablet    Sig: TAKE 1 TABLET BY MOUTH 2 TIMES DAILY WITH A MEAL.    Dispense:  180 tablet    Refill:  1    Order Specific Question:  Supervising Provider    Answer:  Mikey Kirschner [[id number]]  . omeprazole (PRILOSEC) 20 MG capsule  Sig: Take 1 capsule (20 mg total) by mouth daily.    Dispense:  90 capsule    Refill:  1    Order Specific Question:  Supervising Provider    Answer:  Mikey Kirschner [[id number]]  . Naltrexone-Bupropion HCl ER (CONTRAVE) 8-90 MG TB12    Sig: 2 po BID    Dispense:  120 tablet    Refill:  1    Order Specific Question:  Supervising Provider    Answer:  Mikey Kirschner [[id number]]  . methylPREDNISolone acetate (DEPO-MEDROL) injection 40 mg    Sig:    Trial of contrave as directed. DC med and call if any problems. Our goal for her weight loss is 5-10 pounds over the next few months. Given Depo-Medrol in office today to help with head congestion. Continue OTC meds as directed. Start omega-3 supplement as directed. Recommend regular exercise program. Return in about 6 months (around 04/28/2015) for recheck.

## 2014-11-09 ENCOUNTER — Telehealth: Payer: Self-pay | Admitting: Family Medicine

## 2014-11-09 NOTE — Telephone Encounter (Signed)
Rx prior auth APPROVED for pt's Naltrexone-Bupropion HCl ER (CONTRAVE) 8-90 MG TB12, valid 11/05/14-02/25/15, faxed approval to [facility]

## 2014-11-27 ENCOUNTER — Other Ambulatory Visit: Payer: Self-pay | Admitting: Nurse Practitioner

## 2014-11-27 ENCOUNTER — Telehealth: Payer: Self-pay | Admitting: Nurse Practitioner

## 2014-11-27 MED ORDER — ONDANSETRON 4 MG PO TBDP: 4.0000 mg | ORAL_TABLET | Freq: Three times a day (TID) | ORAL | Status: DC | PRN

## 2014-11-27 NOTE — Telephone Encounter (Signed)
Pt.notified

## 2014-11-27 NOTE — Telephone Encounter (Signed)
Pt wants something for nausea to get on hand.  Has had nausea twice since starting diet pill two weeks ago.

## 2014-11-27 NOTE — Telephone Encounter (Signed)
Pt is having the nausea side effect now an again with the Diet pill you have her on, she says she knows its one of the  Side effects an just wants it on hand.    [facility]

## 2014-11-27 NOTE — Telephone Encounter (Signed)
done

## 2015-01-08 ENCOUNTER — Other Ambulatory Visit: Payer: Self-pay | Admitting: Nurse Practitioner

## 2015-01-08 ENCOUNTER — Telehealth: Payer: Self-pay | Admitting: Family Medicine

## 2015-01-08 NOTE — Telephone Encounter (Signed)
Call cell [phone]

## 2015-01-08 NOTE — Telephone Encounter (Signed)
[facility] to let pt know carolyn is out of office until monday

## 2015-01-08 NOTE — Telephone Encounter (Signed)
Pt wants a call back before faxing so she can use coupon. Pt aware it will Monday when carolyn comes back.

## 2015-01-08 NOTE — Telephone Encounter (Signed)
Pt has a Outpatient refill coming for her Contrave She wants a call from our office if we can call her to let  Her know we sent the script back to Outpatient so she can  Call them to remind them of a coupon she has on active file  With them.   Thanks

## 2015-01-13 ENCOUNTER — Other Ambulatory Visit: Payer: Self-pay | Admitting: Nurse Practitioner

## 2015-01-13 MED ORDER — NALTREXONE-BUPROPION HCL ER 8-90 MG PO TB12: ORAL_TABLET | ORAL | Status: DC

## 2015-01-13 NOTE — Telephone Encounter (Signed)
Let patient know order has been sent. Thanks.

## 2015-01-13 NOTE — Telephone Encounter (Signed)
Patient was notifed

## 2015-01-22 ENCOUNTER — Other Ambulatory Visit: Payer: Self-pay | Admitting: Nurse Practitioner

## 2015-02-25 MED FILL — ESZOPICLONE 3 MG TABLET: 3 | 30 days supply | Qty: 30 | Fill #0

## 2015-03-05 ENCOUNTER — Ambulatory Visit (INDEPENDENT_AMBULATORY_CARE_PROVIDER_SITE_OTHER): Payer: 59 | Admitting: Family Medicine

## 2015-03-05 ENCOUNTER — Encounter: Payer: Self-pay | Admitting: Family Medicine

## 2015-03-05 VITALS — BP 136/82 | Temp 98.5°F | Ht 65.0 in | Wt 194.0 lb

## 2015-03-05 DIAGNOSIS — J310 Chronic rhinitis: Secondary | ICD-10-CM

## 2015-03-05 DIAGNOSIS — J329 Chronic sinusitis, unspecified: Secondary | ICD-10-CM

## 2015-03-05 MED ORDER — CEFDINIR 300 MG PO CAPS: 300.0000 mg | ORAL_CAPSULE | Freq: Two times a day (BID) | ORAL | Status: DC

## 2015-03-05 MED ORDER — HYDROCODONE-HOMATROPINE 5-1.5 MG/5ML PO SYRP: 5.0000 mL | ORAL_SOLUTION | Freq: Every evening | ORAL | Status: DC | PRN

## 2015-03-05 MED FILL — HYDROCODONE-HOMATROPINE SYR: 5-1.5 | 18 days supply | Qty: 90 | Fill #0

## 2015-03-05 MED FILL — CEFDINIR 300 MG CAPSULE: 300 | 10 days supply | Qty: 20 | Fill #0

## 2015-03-05 NOTE — Progress Notes (Signed)
   Subjective:    Patient ID: [name], female    DOB: [birth year], 45 y.o.   MRN: [id number]  Cough This is a new problem. The current episode started in the past 7 days. Associated symptoms include ear pain, a fever, headaches, nasal congestion and a sore throat. Treatments tried: sudafed.   throat discomfort generally worse in morning.  Headache frontal in nature worse with change of position comes and goes sharp at times aching at times.   Ear pain primarily right ear. Sharp in nature.  Cough very aggravating at night generally nonproductive  Review of Systems  Constitutional: Positive for fever.  HENT: Positive for ear pain and sore throat.   Respiratory: Positive for cough.   Neurological: Positive for headaches.       Objective:   Physical Exam  Alert, mild malaise. Hydration good Vitals stable. frontal/ maxillary tenderness evident positive nasal congestion. pharynx normal neck supple  lungs clear/no crackles or wheezes. heart regular in rhythm       Assessment & Plan:  Impression rhinosinusitis likely post viral, discussed with patient. plan antibiotics prescribed. Questions answered. Symptomatic care discussed. warning signs discussed. WSL

## 2015-03-11 ENCOUNTER — Telehealth: Payer: Self-pay | Admitting: Family Medicine

## 2015-03-11 ENCOUNTER — Telehealth: Payer: Self-pay | Admitting: *Deleted

## 2015-03-11 MED ORDER — BENZONATATE 100 MG PO CAPS: 100.0000 mg | ORAL_CAPSULE | Freq: Four times a day (QID) | ORAL | Status: DC | PRN

## 2015-03-11 MED ORDER — CLARITHROMYCIN 500 MG PO TABS: 500.0000 mg | ORAL_TABLET | Freq: Two times a day (BID) | ORAL | Status: DC

## 2015-03-11 MED FILL — CLARITHROMYCIN 500 MG TAB: 500 | 10 days supply | Qty: 20 | Fill #0

## 2015-03-11 MED FILL — BENZONATATE 100 MG CAPSULE: 100 | 8 days supply | Qty: 30 | Fill #0

## 2015-03-11 NOTE — Telephone Encounter (Signed)
Rx sent electronically to pharmacy. Patient notified. 

## 2015-03-11 NOTE — Telephone Encounter (Signed)
biaxin 500 bid ten d 

## 2015-03-11 NOTE — Telephone Encounter (Signed)
03/05/15 the patient was seen and still having congestion, headaches, cough, loss of voice Still green mucus was issued [employer]  Out patient pharmacy

## 2015-03-11 NOTE — Telephone Encounter (Addendum)
Rx sent electronically to pharmacy. Patient notified. 

## 2015-03-11 NOTE — Telephone Encounter (Signed)
Patient states she has take all her hycodan and needs something else for cough- Patient is ok with tessalon perles if you don't want to refill hycodan.

## 2015-03-11 NOTE — Telephone Encounter (Signed)
30 tess perles 100 mg one q six prn

## 2015-03-11 NOTE — Telephone Encounter (Signed)
Given omnicef and hycodan

## 2015-03-13 ENCOUNTER — Telehealth: Payer: Self-pay | Admitting: Family Medicine

## 2015-03-13 NOTE — Telephone Encounter (Signed)
Rx prior auth APPROVED for pt's Naltrexone-Bupropion HCl ER (CONTRAVE) 8-90 MG, effective 03/13/15 - 03/12/16, faxed to [facility] out pt pharmacy

## 2015-03-14 ENCOUNTER — Telehealth: Payer: Self-pay | Admitting: Nurse Practitioner

## 2015-03-14 ENCOUNTER — Other Ambulatory Visit: Payer: Self-pay | Admitting: *Deleted

## 2015-03-14 ENCOUNTER — Other Ambulatory Visit: Payer: Self-pay | Admitting: Nurse Practitioner

## 2015-03-14 MED ORDER — NALTREXONE-BUPROPION HCL ER 8-90 MG PO TB12: ORAL_TABLET | ORAL | Status: DC

## 2015-03-14 MED FILL — CONTRAVE ER 8-90 MG TABLET: 8-90 | 30 days supply | Qty: 120 | Fill #0

## 2015-03-14 NOTE — Telephone Encounter (Signed)
Please see fax in yellow folder on [name]'s desk. The Contrave Rx was originally wrote for 70 tabs, but because patient will be taking 4 tablets daily, a new Rx for 120 tabs has to be sent over in order for her to use the coupon.  [facility]

## 2015-03-14 NOTE — Telephone Encounter (Signed)
Pt.notified

## 2015-03-14 NOTE — Telephone Encounter (Signed)
Sent in Rx for 120 with one refill

## 2015-03-17 MED FILL — CYCLOBENZAPRINE 10 MG TAB: 10 | 90 days supply | Qty: 90 | Fill #1

## 2015-03-31 MED FILL — ALPRAZolam 0.5 MG TABS: 0.5 | 30 days supply | Qty: 60 | Fill #2

## 2015-03-31 MED FILL — HYDROCHLOROTHIAZIDE 25 MG T: 25 | 90 days supply | Qty: 90 | Fill #1

## 2015-03-31 MED FILL — metFORMIN HCL 1000 MG TABS: 1000 | 90 days supply | Qty: 180 | Fill #1

## 2015-03-31 MED FILL — ESZOPICLONE 3 MG TABLET: 3 | 30 days supply | Qty: 30 | Fill #1

## 2015-04-04 ENCOUNTER — Ambulatory Visit (INDEPENDENT_AMBULATORY_CARE_PROVIDER_SITE_OTHER): Payer: 59 | Admitting: Nurse Practitioner

## 2015-04-04 ENCOUNTER — Encounter: Payer: Self-pay | Admitting: Nurse Practitioner

## 2015-04-04 VITALS — BP 132/88 | Ht 66.0 in | Wt 186.0 lb

## 2015-04-04 DIAGNOSIS — F419 Anxiety disorder, unspecified: Secondary | ICD-10-CM

## 2015-04-04 DIAGNOSIS — R7301 Impaired fasting glucose: Secondary | ICD-10-CM

## 2015-04-04 DIAGNOSIS — F329 Major depressive disorder, single episode, unspecified: Secondary | ICD-10-CM

## 2015-04-04 DIAGNOSIS — F418 Other specified anxiety disorders: Secondary | ICD-10-CM | POA: Diagnosis not present

## 2015-04-04 MED ORDER — METFORMIN HCL 1000 MG PO TABS: ORAL_TABLET | ORAL | Status: DC

## 2015-04-04 MED ORDER — ESCITALOPRAM OXALATE 10 MG PO TABS: 10.0000 mg | ORAL_TABLET | Freq: Every day | ORAL | Status: DC

## 2015-04-04 MED ORDER — ONDANSETRON 4 MG PO TBDP: 4.0000 mg | ORAL_TABLET | Freq: Three times a day (TID) | ORAL | Status: DC | PRN

## 2015-04-04 MED ORDER — NALTREXONE-BUPROPION HCL ER 8-90 MG PO TB12: ORAL_TABLET | ORAL | Status: DC

## 2015-04-04 MED FILL — CONTRAVE ER 8-90 MG TABLET: 8-90 | 30 days supply | Qty: 120 | Fill #0

## 2015-04-04 MED FILL — ONDANSETRON ODT 4 MG TABLET: 4 | 10 days supply | Qty: 30 | Fill #0

## 2015-04-04 MED FILL — ESCITALOPRAM 10 MG TABLET: 10 | 90 days supply | Qty: 90 | Fill #0

## 2015-04-07 ENCOUNTER — Encounter: Payer: Self-pay | Admitting: Nurse Practitioner

## 2015-04-07 NOTE — Progress Notes (Signed)
Subjective:  Presents for routine follow-up. Doing very well on [facility]. Denies any adverse effects. Has been off her Lexapro. Has noticed increase anxiety and emotional lability. Would like to restart this. Gets lab work done through work. Also needs a refill on her metformin.  Objective:   BP 132/88 mmHg  Ht 5\' 6"  (1.676 m)  Wt 186 lb (84.369 kg)  BMI 30.04 kg/m2 NAD. Alert, oriented. Lungs clear. Heart regular rate rhythm. Has lost approximately 20 pounds since late August.  Assessment:  Problem List Items Addressed This Visit      Endocrine   Impaired fasting glucose - Primary     Other   Anxiety and depression   Morbid obesity ([place])   Relevant Medications   Naltrexone-Bupropion HCl ER (CONTRAVE) 8-90 MG TB12   metFORMIN (GLUCOPHAGE) 1000 MG tablet     Plan:  Meds ordered this encounter  Medications  . Naltrexone-Bupropion HCl ER (CONTRAVE) 8-90 MG TB12    Sig: 2 po BID    Dispense:  120 tablet    Refill:  5    Order Specific Question:  Supervising Provider    Answer:  Mikey Kirschner [[id number]]  . DISCONTD: escitalopram (LEXAPRO) 10 MG tablet    Sig: Take 1 tablet (10 mg total) by mouth daily.    Dispense:  30 tablet    Refill:  5    Order Specific Question:  Supervising Provider    Answer:  Mikey Kirschner [[id number]]  . escitalopram (LEXAPRO) 10 MG tablet    Sig: Take 1 tablet (10 mg total) by mouth daily.    Dispense:  90 tablet    Refill:  1    Order Specific Question:  Supervising Provider    Answer:  Mikey Kirschner [[id number]]  . metFORMIN (GLUCOPHAGE) 1000 MG tablet    Sig: TAKE 1 TABLET BY MOUTH 2 TIMES DAILY WITH A MEAL.    Dispense:  180 tablet    Refill:  1    Order Specific Question:  Supervising Provider    Answer:  Mikey Kirschner [[id number]]  . ondansetron (ZOFRAN ODT) 4 MG disintegrating tablet    Sig: Take 1 tablet (4 mg total) by mouth every 8 (eight) hours as needed for nausea or vomiting.    Dispense:  30 tablet    Refill:  0    Order Specific  Question:  Supervising Provider    Answer:  Mikey Kirschner [[id number]]   continue contrast as directed. Restart Lexapro. Return in about 6 months (around 10/02/2015) for physical. Call back sooner if any problems. Patient to bring copy of her labs to next visit.

## 2015-04-23 ENCOUNTER — Ambulatory Visit: Payer: 59 | Admitting: Nurse Practitioner

## 2015-04-28 ENCOUNTER — Ambulatory Visit: Payer: 59 | Admitting: Nurse Practitioner

## 2015-04-28 MED FILL — ESZOPICLONE 3 MG TABLET: 3 | 30 days supply | Qty: 30 | Fill #2

## 2015-04-28 MED FILL — CONTRAVE ER 8-90 MG TABLET: 8-90 | 30 days supply | Qty: 120 | Fill #1

## 2015-04-28 MED FILL — OMEPRAZOLE DR 20 MG CAPSULE: 20 | 90 days supply | Qty: 90 | Fill #0

## 2015-04-28 MED FILL — ALPRAZolam 0.5 MG TABS: 0.5 | 30 days supply | Qty: 60 | Fill #3

## 2015-05-27 MED FILL — ALPRAZolam 0.5 MG TABS: 0.5 | 30 days supply | Qty: 60 | Fill #4

## 2015-05-27 MED FILL — ESZOPICLONE 3 MG TABLET: 3 | 30 days supply | Qty: 30 | Fill #3

## 2015-05-27 MED FILL — CONTRAVE ER 8-90 MG TABLET: 8-90 | 30 days supply | Qty: 120 | Fill #2

## 2015-06-02 MED FILL — CLARITHROMYCIN 500 MG TAB: 500 | 10 days supply | Qty: 20 | Fill #0

## 2015-06-11 ENCOUNTER — Telehealth: Payer: Self-pay | Admitting: Nurse Practitioner

## 2015-06-11 NOTE — Telephone Encounter (Signed)
Pt is wanting to know if you can increase her Lexapro to 20 mg from  The 10 mg that she is currently on.   Not feeling that she is being controlled well at the lower dose.   Please advise   Outpatient

## 2015-06-12 ENCOUNTER — Other Ambulatory Visit: Payer: Self-pay | Admitting: Nurse Practitioner

## 2015-06-12 MED ORDER — ESCITALOPRAM OXALATE 20 MG PO TABS: 20.0000 mg | ORAL_TABLET | Freq: Every day | ORAL | Status: DC

## 2015-06-12 MED FILL — ESCITALOPRAM 20 MG TABLET: 20 | 90 days supply | Qty: 90 | Fill #0

## 2015-06-12 NOTE — Telephone Encounter (Signed)
Take 2 of what she has now (10 mg). New Rx for 20 mg sent in.

## 2015-06-12 NOTE — Telephone Encounter (Signed)
[facility]

## 2015-06-13 NOTE — Telephone Encounter (Signed)
Discussed with pt. Pt verbalized understanding.  °

## 2015-06-26 MED FILL — ALPRAZolam 0.5 MG TABS: 0.5 | 30 days supply | Qty: 60 | Fill #5

## 2015-06-26 MED FILL — CONTRAVE ER 8-90 MG TABLET: 8-90 | 30 days supply | Qty: 120 | Fill #3

## 2015-06-26 MED FILL — ESZOPICLONE 3 MG TABLET: 3 | 30 days supply | Qty: 30 | Fill #4

## 2015-06-30 MED FILL — metFORMIN HCL 1000 MG TABS: 1000 | 90 days supply | Qty: 180 | Fill #0

## 2015-07-28 MED FILL — CONTRAVE ER 8-90 MG TABLET: 8-90 | 30 days supply | Qty: 120 | Fill #4

## 2015-07-28 MED FILL — ESZOPICLONE 3 MG TABLET: 3 | 30 days supply | Qty: 30 | Fill #5

## 2015-07-30 ENCOUNTER — Telehealth: Payer: Self-pay | Admitting: Nurse Practitioner

## 2015-07-30 NOTE — Telephone Encounter (Signed)
Received Rx request via Fax for Alprazolam 0.5 mg tablet. Requesting pharmacy is [facility].

## 2015-07-31 ENCOUNTER — Other Ambulatory Visit: Payer: Self-pay | Admitting: Nurse Practitioner

## 2015-07-31 MED ORDER — ALPRAZOLAM 0.5 MG PO TABS: ORAL_TABLET | ORAL | Status: DC

## 2015-07-31 MED FILL — ALPRAZolam 0.5 MG TABS: 0.5 | 30 days supply | Qty: 60 | Fill #0

## 2015-07-31 NOTE — Telephone Encounter (Signed)
Refill printed 

## 2015-08-25 ENCOUNTER — Other Ambulatory Visit: Payer: Self-pay | Admitting: Nurse Practitioner

## 2015-08-25 MED FILL — CONTRAVE ER 8-90 MG TABLET: 8-90 | 30 days supply | Qty: 120 | Fill #5

## 2015-08-25 MED FILL — CYCLOBENZAPRINE 10 MG TAB: 10 | 90 days supply | Qty: 90 | Fill #0

## 2015-08-25 MED FILL — ESZOPICLONE 3 MG TABLET: 3 | 30 days supply | Qty: 30 | Fill #0

## 2015-08-28 MED FILL — ALPRAZolam 0.5 MG TABS: 0.5 | 30 days supply | Qty: 60 | Fill #1

## 2015-09-09 ENCOUNTER — Other Ambulatory Visit: Payer: Self-pay | Admitting: Nurse Practitioner

## 2015-09-09 MED FILL — ESCITALOPRAM 20 MG TABLET: 20 | 90 days supply | Qty: 90 | Fill #0

## 2015-09-19 ENCOUNTER — Encounter: Payer: Self-pay | Admitting: Family Medicine

## 2015-09-19 ENCOUNTER — Ambulatory Visit (INDEPENDENT_AMBULATORY_CARE_PROVIDER_SITE_OTHER): Payer: 59 | Admitting: Family Medicine

## 2015-09-19 VITALS — BP 132/82 | Temp 97.8°F | Ht 65.0 in | Wt 178.0 lb

## 2015-09-19 DIAGNOSIS — J329 Chronic sinusitis, unspecified: Secondary | ICD-10-CM | POA: Diagnosis not present

## 2015-09-19 DIAGNOSIS — J310 Chronic rhinitis: Secondary | ICD-10-CM

## 2015-09-19 MED ORDER — HYDROCODONE-HOMATROPINE 5-1.5 MG/5ML PO SYRP: 5.0000 mL | ORAL_SOLUTION | Freq: Every evening | ORAL | 0 refills | Status: DC | PRN

## 2015-09-19 MED ORDER — CEFDINIR 300 MG PO CAPS: 300.0000 mg | ORAL_CAPSULE | Freq: Two times a day (BID) | ORAL | 0 refills | Status: DC

## 2015-09-19 MED FILL — HYDROCODONE-HOMATROPINE SYR: 5-1.5 | 18 days supply | Qty: 90 | Fill #0

## 2015-09-19 MED FILL — CEFDINIR 300 MG CAPSULE: 300 | 10 days supply | Qty: 20 | Fill #0

## 2015-09-19 NOTE — Progress Notes (Signed)
   Subjective:    Patient ID: [name], female    DOB: 1970-10-31, 45 y.o.   MRN: [id number]  Sinus Problem  This is a new problem. The current episode started in the past 7 days. Associated symptoms include congestion, coughing, headaches and a sore throat. Treatments tried: mucinex.   Throat wctratchy   Taking mucinex  Now frontal headache frontal  Cough  Gunky at times  Cough  No chest symtoms     Review of Systems  HENT: Positive for congestion and sore throat.   Respiratory: Positive for cough.   Neurological: Positive for headaches.       Objective:   Physical Exam  Alert, mild malaise. Hydration good Vitals stable. frontal/ maxillary tenderness evident positive nasal congestion. pharynx normal neck supple  lungs clear/no crackles or wheezes. heart regular in rhythm       Assessment & Plan:  Impression rhinosinusitis likely post viral, discussed with patient. plan antibiotics prescribed. Questions answered. Symptomatic care discussed. warning signs discussed. WSL

## 2015-09-26 ENCOUNTER — Other Ambulatory Visit: Payer: Self-pay | Admitting: Nurse Practitioner

## 2015-09-26 MED FILL — ALPRAZolam 0.5 MG TABS: 0.5 | 30 days supply | Qty: 60 | Fill #2

## 2015-09-26 MED FILL — metFORMIN HCL 1000 MG TABS: 1000 | 90 days supply | Qty: 180 | Fill #1

## 2015-09-26 MED FILL — ESZOPICLONE 3 MG TABLET: 3 | 30 days supply | Qty: 30 | Fill #1

## 2015-09-30 ENCOUNTER — Other Ambulatory Visit: Payer: Self-pay | Admitting: *Deleted

## 2015-09-30 ENCOUNTER — Telehealth: Payer: Self-pay | Admitting: Nurse Practitioner

## 2015-09-30 MED ORDER — NALTREXONE-BUPROPION HCL ER 8-90 MG PO TB12: ORAL_TABLET | ORAL | 0 refills | Status: DC

## 2015-09-30 MED FILL — CONTRAVE ER 8-90 MG TABLET: 8-90 | 30 days supply | Qty: 120 | Fill #0

## 2015-09-30 NOTE — Telephone Encounter (Signed)
Pt wants refill on contrave. Last med check was 04/04/15

## 2015-09-30 NOTE — Telephone Encounter (Signed)
Med sent to pharm. Pt notified on voicemail.  

## 2015-09-30 NOTE — Telephone Encounter (Signed)
Pt states that OP pharmacy called to say that [name] denied this med yesterday because she has not made an appt but she did make one some time ago an it is for this Friday with [name].  Pt wants to know if she needs to know if she needs to wait till Friday or can this can go ahead and be filled.

## 2015-09-30 NOTE — Telephone Encounter (Signed)
Pt needs refill on this med please, states [facility] has sent it to our office twice electronically   OP pharmacy please

## 2015-09-30 NOTE — Telephone Encounter (Signed)
Refill x 1. Thanks. Follow up Friday as planned.

## 2015-10-03 ENCOUNTER — Ambulatory Visit (INDEPENDENT_AMBULATORY_CARE_PROVIDER_SITE_OTHER): Payer: 59 | Admitting: Nurse Practitioner

## 2015-10-03 ENCOUNTER — Encounter: Payer: Self-pay | Admitting: Nurse Practitioner

## 2015-10-03 VITALS — BP 104/70 | Temp 98.8°F | Ht 65.0 in | Wt 178.4 lb

## 2015-10-03 DIAGNOSIS — J310 Chronic rhinitis: Secondary | ICD-10-CM

## 2015-10-03 DIAGNOSIS — J329 Chronic sinusitis, unspecified: Secondary | ICD-10-CM | POA: Diagnosis not present

## 2015-10-03 MED ORDER — AMOXICILLIN-POT CLAVULANATE 875-125 MG PO TABS: 1.0000 | ORAL_TABLET | Freq: Two times a day (BID) | ORAL | 0 refills | Status: DC

## 2015-10-03 MED FILL — AMOX-CLAV 875-125 MG TABLET: 875-125 | 10 days supply | Qty: 20 | Fill #0

## 2015-10-04 ENCOUNTER — Encounter: Payer: Self-pay | Admitting: Nurse Practitioner

## 2015-10-04 NOTE — Progress Notes (Signed)
Subjective:  Presents for recheck. Doing well on Contrave. Currently on one BID but plans to increase dose if weight loss plateaus. Only side effect is slight nausea. Regular walking for activity. Also still has sinus symptoms. Last seen 7/28. Continued cough, worse at night. Sore throat. No fever. No ear pain.   Objective:   BP 104/70   Temp 98.8 F (37.1 C) (Oral)   Ht 5\' 5"  (1.651 m)   Wt 178 lb 6 oz (80.9 kg)   BMI 29.68 kg/m  NAD. Alert, oriented. TMs mild clear effusion. Pharynx injected with PND noted. Neck supple with mild anterior adenopathy. Lungs clear. Heart RRR. Continued weight loss noted.   Assessment:  Problem List Items Addressed This Visit      Other   Morbid obesity ([place])    Other Visit Diagnoses    Rhinosinusitis    -  Primary   Relevant Medications   amoxicillin-clavulanate (AUGMENTIN) 875-125 MG tablet     Plan:  Meds ordered this encounter  Medications  . amoxicillin-clavulanate (AUGMENTIN) 875-125 MG tablet    Sig: Take 1 tablet by mouth 2 (two) times daily.    Dispense:  20 tablet    Refill:  0    Order Specific Question:   Supervising Provider    Answer:   Mikey Kirschner [[id number]]   OTC meds as directed for congestion and cough. Call back if worsens or persists.  Continue contrave as directed. Recheck in 6 months.

## 2015-10-10 DIAGNOSIS — Z6830 Body mass index (BMI) 30.0-30.9, adult: Secondary | ICD-10-CM | POA: Diagnosis not present

## 2015-10-10 DIAGNOSIS — Z1231 Encounter for screening mammogram for malignant neoplasm of breast: Secondary | ICD-10-CM | POA: Diagnosis not present

## 2015-10-10 DIAGNOSIS — Z01419 Encounter for gynecological examination (general) (routine) without abnormal findings: Secondary | ICD-10-CM | POA: Diagnosis not present

## 2015-10-10 DIAGNOSIS — Z130 Encounter for screening for diseases of the blood and blood-forming organs and certain disorders involving the immune mechanism: Secondary | ICD-10-CM | POA: Diagnosis not present

## 2015-10-10 DIAGNOSIS — R5383 Other fatigue: Secondary | ICD-10-CM | POA: Diagnosis not present

## 2015-10-10 DIAGNOSIS — Z1321 Encounter for screening for nutritional disorder: Secondary | ICD-10-CM | POA: Diagnosis not present

## 2015-10-10 DIAGNOSIS — Z13228 Encounter for screening for other metabolic disorders: Secondary | ICD-10-CM | POA: Diagnosis not present

## 2015-10-10 DIAGNOSIS — Z1329 Encounter for screening for other suspected endocrine disorder: Secondary | ICD-10-CM | POA: Diagnosis not present

## 2015-10-10 DIAGNOSIS — Z13220 Encounter for screening for lipoid disorders: Secondary | ICD-10-CM | POA: Diagnosis not present

## 2015-10-10 DIAGNOSIS — Z131 Encounter for screening for diabetes mellitus: Secondary | ICD-10-CM | POA: Diagnosis not present

## 2015-10-10 MED FILL — SERTRALINE HCL 50 MG TABLET: 50 | 30 days supply | Qty: 30 | Fill #0

## 2015-10-16 MED FILL — VIT D3-50 50,000 UNITS CAPS: 1.25 MG | 56 days supply | Qty: 8 | Fill #0

## 2015-10-17 ENCOUNTER — Encounter: Payer: Self-pay | Admitting: Nurse Practitioner

## 2015-10-17 ENCOUNTER — Other Ambulatory Visit: Payer: Self-pay | Admitting: Nurse Practitioner

## 2015-10-21 ENCOUNTER — Encounter: Payer: Self-pay | Admitting: Nurse Practitioner

## 2015-10-21 ENCOUNTER — Telehealth: Payer: Self-pay | Admitting: Nurse Practitioner

## 2015-10-21 NOTE — Telephone Encounter (Signed)
Patient dropped off copies of labwork for you to review.

## 2015-10-22 ENCOUNTER — Other Ambulatory Visit: Payer: Self-pay | Admitting: Family Medicine

## 2015-10-22 MED ORDER — FLUCONAZOLE 150 MG PO TABS: ORAL_TABLET | ORAL | 0 refills | Status: DC

## 2015-10-22 MED FILL — busPIRone HCL 5 MG TABS: 5 | 30 days supply | Qty: 30 | Fill #0

## 2015-10-22 MED FILL — FLUCONAZOLE 150 MG TABLET: 150 | 6 days supply | Qty: 2 | Fill #0

## 2015-10-22 NOTE — Telephone Encounter (Signed)
Per protocol:  fluconazole (DIFLUCAN) 150 MG tablet  2 ordered         Summary: Take 1 tablet 3 days apart, Normal     Patient reports symptoms of yeast infection. Medication sent electronically to pharmacy. Patient notified.

## 2015-10-22 NOTE — Telephone Encounter (Signed)
Pt is needing something for a yeast inf called in.     [facility]

## 2015-10-23 ENCOUNTER — Other Ambulatory Visit: Payer: Self-pay | Admitting: Nurse Practitioner

## 2015-10-23 NOTE — Telephone Encounter (Signed)
Note sent through my chart. Will review with patient at visit.

## 2015-10-24 ENCOUNTER — Other Ambulatory Visit (HOSPITAL_COMMUNITY)
Admission: RE | Admit: 2015-10-24 | Discharge: 2015-10-24 | Disposition: A | Discharge status: Home / Self Care | Payer: Commercial Managed Care - PPO | Source: Ambulatory Visit | Attending: Physical Medicine and Rehabilitation | Admitting: Physical Medicine and Rehabilitation

## 2015-10-24 DIAGNOSIS — E038 Other specified hypothyroidism: Secondary | ICD-10-CM | POA: Diagnosis not present

## 2015-10-24 DIAGNOSIS — E2839 Other primary ovarian failure: Secondary | ICD-10-CM | POA: Diagnosis not present

## 2015-10-25 LAB — PROGESTERONE: Progesterone: 1 ng/mL

## 2015-10-25 LAB — ESTRADIOL: Estradiol: 173.6 pg/mL

## 2015-10-25 LAB — T3, FREE: T3 FREE: 2.5 pg/mL (ref 2.0–4.4)

## 2015-10-29 ENCOUNTER — Other Ambulatory Visit: Payer: Self-pay | Admitting: Nurse Practitioner

## 2015-10-29 ENCOUNTER — Encounter: Payer: Self-pay | Admitting: Nurse Practitioner

## 2015-10-29 MED FILL — ESZOPICLONE 3 MG TABLET: 3 | 30 days supply | Qty: 30 | Fill #2

## 2015-10-29 MED FILL — PROGESTERONE 100 MG CAPSULE: 100 | 30 days supply | Qty: 30 | Fill #0

## 2015-10-29 MED FILL — ARMOUR THYROID 15 MG TABLET: 15 | 30 days supply | Qty: 60 | Fill #0

## 2015-10-29 MED FILL — ALPRAZolam 0.5 MG TABS: 0.5 | 30 days supply | Qty: 60 | Fill #3

## 2015-10-30 ENCOUNTER — Other Ambulatory Visit: Payer: Self-pay | Admitting: Nurse Practitioner

## 2015-10-30 ENCOUNTER — Telehealth: Payer: Self-pay | Admitting: Family Medicine

## 2015-10-30 MED ORDER — NALTREXONE-BUPROPION HCL ER 8-90 MG PO TB12: ORAL_TABLET | ORAL | 5 refills | Status: DC

## 2015-10-30 MED FILL — CONTRAVE ER 8-90 MG TABLET: 8-90 | 30 days supply | Qty: 120 | Fill #0

## 2015-10-30 NOTE — Telephone Encounter (Signed)
[facility] is supposed to be sending a refill request on her Contrave. She is requesting a call when this is sent in.

## 2015-10-30 NOTE — Telephone Encounter (Signed)
Pt notified on voicemail that script has been sent to pharm.

## 2015-10-31 ENCOUNTER — Encounter: Payer: Self-pay | Admitting: Nurse Practitioner

## 2015-10-31 ENCOUNTER — Ambulatory Visit (INDEPENDENT_AMBULATORY_CARE_PROVIDER_SITE_OTHER): Payer: 59 | Admitting: Nurse Practitioner

## 2015-10-31 ENCOUNTER — Telehealth: Payer: Self-pay | Admitting: Family Medicine

## 2015-10-31 VITALS — BP 136/82 | Ht 65.0 in | Wt 177.0 lb

## 2015-10-31 DIAGNOSIS — E559 Vitamin D deficiency, unspecified: Secondary | ICD-10-CM

## 2015-10-31 DIAGNOSIS — R7301 Impaired fasting glucose: Secondary | ICD-10-CM

## 2015-10-31 NOTE — Telephone Encounter (Signed)
Patient asked me to call [employer] and check and see if the Tdap vaccine would be covered since she is an adult.  I verified with [employer] and this is a covered vaccine.  I was told this was covered at 100%.

## 2015-10-31 NOTE — Patient Instructions (Signed)
Omega 3 supplement Vitamin D 2000 units per day Biotin Sulfate free shampoo Alive vitamin

## 2015-10-31 NOTE — Progress Notes (Signed)
Subjective:  Presents to discuss recent labs. Doing well on Contrave. No side effects. Taking daily vitamin D.   Objective:   BP 136/82   Ht 5\' 5"  (1.651 m)   Wt 177 lb (80.3 kg)   BMI 29.45 kg/m  Reviewed recent labs. See scanned form.   Assessment:  Problem List Items Addressed This Visit      Endocrine   Impaired fasting glucose - Primary     Other   Morbid obesity ([place])   Vitamin D deficiency    Other Visit Diagnoses   None.    Plan: Omega 3 supplement Vitamin D 2000 units per day .Return in about 6 months (around 04/29/2016).

## 2015-11-21 ENCOUNTER — Ambulatory Visit (INDEPENDENT_AMBULATORY_CARE_PROVIDER_SITE_OTHER): Payer: 59

## 2015-11-21 DIAGNOSIS — Z23 Encounter for immunization: Secondary | ICD-10-CM

## 2015-11-25 MED FILL — busPIRone HCL 5 MG TABS: 5 | 30 days supply | Qty: 30 | Fill #1

## 2015-11-25 MED FILL — CONTRAVE ER 8-90 MG TABLET: 8-90 | 30 days supply | Qty: 120 | Fill #1

## 2015-11-26 MED FILL — ALPRAZolam 0.5 MG TABS: 0.5 | 30 days supply | Qty: 60 | Fill #4

## 2015-11-28 ENCOUNTER — Other Ambulatory Visit (HOSPITAL_COMMUNITY)
Admission: RE | Admit: 2015-11-28 | Discharge: 2015-11-28 | Disposition: A | Discharge status: Home / Self Care | Payer: 59 | Source: Ambulatory Visit | Attending: Physical Medicine and Rehabilitation | Admitting: Physical Medicine and Rehabilitation

## 2015-11-28 DIAGNOSIS — E2839 Other primary ovarian failure: Secondary | ICD-10-CM | POA: Diagnosis not present

## 2015-11-28 DIAGNOSIS — E038 Other specified hypothyroidism: Secondary | ICD-10-CM | POA: Diagnosis not present

## 2015-11-28 LAB — TSH: TSH: 0.351 u[IU]/mL (ref 0.350–4.500)

## 2015-11-28 LAB — T4, FREE: Free T4: 0.78 ng/dL (ref 0.61–1.12)

## 2015-11-29 LAB — T3, FREE: T3 FREE: 3.9 pg/mL (ref 2.0–4.4)

## 2015-11-29 LAB — PROGESTERONE: Progesterone: 0.1 ng/mL

## 2015-11-29 LAB — ESTRADIOL: ESTRADIOL: 32.6 pg/mL

## 2015-11-30 LAB — TESTOSTERONE,FREE AND TOTAL
TESTOSTERONE FREE: 5.9 pg/mL — AB (ref 0.0–4.2)
Testosterone: 83 ng/dL — ABNORMAL HIGH (ref 8–48)

## 2015-12-01 LAB — T3, REVERSE: T3 REVERSE: 11 ng/dL (ref 8–25)

## 2015-12-01 MED FILL — PROGESTERONE 200 MG CAPSULE: 200 | 30 days supply | Qty: 30 | Fill #0

## 2015-12-01 MED FILL — ARMOUR THYROID 15 MG TABLET: 15 | 30 days supply | Qty: 90 | Fill #0

## 2015-12-10 ENCOUNTER — Encounter: Payer: Self-pay | Admitting: Nurse Practitioner

## 2015-12-11 ENCOUNTER — Other Ambulatory Visit: Payer: Self-pay | Admitting: Nurse Practitioner

## 2015-12-11 MED ORDER — ALPRAZOLAM 1 MG PO TABS: 1.0000 mg | ORAL_TABLET | Freq: Two times a day (BID) | ORAL | 5 refills | Status: DC | PRN

## 2015-12-11 MED FILL — ALPRAZolam 1 MG TABS: 1 | 30 days supply | Qty: 60 | Fill #0

## 2015-12-12 DIAGNOSIS — H442E3 Degenerative myopia with other maculopathy, bilateral eye: Secondary | ICD-10-CM | POA: Diagnosis not present

## 2015-12-12 DIAGNOSIS — H52223 Regular astigmatism, bilateral: Secondary | ICD-10-CM | POA: Diagnosis not present

## 2015-12-12 DIAGNOSIS — H524 Presbyopia: Secondary | ICD-10-CM | POA: Diagnosis not present

## 2015-12-12 DIAGNOSIS — H5213 Myopia, bilateral: Secondary | ICD-10-CM | POA: Diagnosis not present

## 2015-12-15 MED FILL — RESTASIS MULTIDOSE 0.05% EY: 0.05 | 90 days supply | Qty: 17 | Fill #0

## 2015-12-24 ENCOUNTER — Other Ambulatory Visit: Payer: Self-pay | Admitting: Nurse Practitioner

## 2015-12-24 MED FILL — CONTRAVE ER 8-90 MG TABLET: 8-90 | 30 days supply | Qty: 120 | Fill #2

## 2015-12-25 MED FILL — metFORMIN HCL 1000 MG TABS: 1000 | 90 days supply | Qty: 180 | Fill #0

## 2015-12-29 MED FILL — PROGESTERONE 200 MG CAPSULE: 200 | 30 days supply | Qty: 30 | Fill #1

## 2015-12-29 MED FILL — ARMOUR THYROID 15 MG TABLET: 15 | 30 days supply | Qty: 90 | Fill #1

## 2016-01-09 ENCOUNTER — Ambulatory Visit (INDEPENDENT_AMBULATORY_CARE_PROVIDER_SITE_OTHER): Payer: 59 | Admitting: Family Medicine

## 2016-01-09 DIAGNOSIS — J329 Chronic sinusitis, unspecified: Secondary | ICD-10-CM | POA: Diagnosis not present

## 2016-01-09 MED ORDER — AMOXICILLIN-POT CLAVULANATE 875-125 MG PO TABS: 1.0000 | ORAL_TABLET | Freq: Two times a day (BID) | ORAL | 0 refills | Status: AC

## 2016-01-09 MED ORDER — BENZONATATE 100 MG PO CAPS: 100.0000 mg | ORAL_CAPSULE | Freq: Three times a day (TID) | ORAL | 0 refills | Status: DC | PRN

## 2016-01-09 MED FILL — AMOX-CLAV 875-125 MG TABLET: 875-125 | 10 days supply | Qty: 20 | Fill #0

## 2016-01-09 MED FILL — BENZONATATE 100 MG CAPSULE: 100 | 10 days supply | Qty: 30 | Fill #0

## 2016-01-09 NOTE — Progress Notes (Signed)
Started one and a half wks ago  Throat got to hyrting  Then head frontal aching   Cough and congestion  Then tried nyquil helped a little    Gunky discharge mostly fom sinuses  Low gr fever  No headache, no major weight loss or weight gain, no chest pain no back pain abdominal pain no change in bowel habits complete ROS otherwise negative  Alert, mild malaise. Hydration good Vitals stable. frontal/ maxillary tenderness evident positive nasal congestion. pharynx normal neck supple  lungs clear/no crackles or wheezes. heart regular in rhythm   Impression rhinosinusitis likely post viral, discussed with patient. plan antibiotics prescribed. Questions answered. Symptomatic care discussed. warning signs discussed. WSL

## 2016-01-11 ENCOUNTER — Encounter: Payer: Self-pay | Admitting: Family Medicine

## 2016-01-12 DIAGNOSIS — D2261 Melanocytic nevi of right upper limb, including shoulder: Secondary | ICD-10-CM | POA: Diagnosis not present

## 2016-01-12 DIAGNOSIS — L821 Other seborrheic keratosis: Secondary | ICD-10-CM | POA: Diagnosis not present

## 2016-01-12 DIAGNOSIS — D225 Melanocytic nevi of trunk: Secondary | ICD-10-CM | POA: Diagnosis not present

## 2016-01-12 DIAGNOSIS — D1801 Hemangioma of skin and subcutaneous tissue: Secondary | ICD-10-CM | POA: Diagnosis not present

## 2016-01-12 DIAGNOSIS — D2262 Melanocytic nevi of left upper limb, including shoulder: Secondary | ICD-10-CM | POA: Diagnosis not present

## 2016-01-12 DIAGNOSIS — D2239 Melanocytic nevi of other parts of face: Secondary | ICD-10-CM | POA: Diagnosis not present

## 2016-01-12 DIAGNOSIS — L814 Other melanin hyperpigmentation: Secondary | ICD-10-CM | POA: Diagnosis not present

## 2016-01-12 DIAGNOSIS — D692 Other nonthrombocytopenic purpura: Secondary | ICD-10-CM | POA: Diagnosis not present

## 2016-01-12 DIAGNOSIS — D485 Neoplasm of uncertain behavior of skin: Secondary | ICD-10-CM | POA: Diagnosis not present

## 2016-01-18 ENCOUNTER — Encounter: Payer: Self-pay | Admitting: Family Medicine

## 2016-01-19 ENCOUNTER — Telehealth: Payer: Self-pay | Admitting: Family Medicine

## 2016-01-19 MED ORDER — BENZONATATE 100 MG PO CAPS: 100.0000 mg | ORAL_CAPSULE | Freq: Three times a day (TID) | ORAL | 0 refills | Status: DC | PRN

## 2016-01-19 MED ORDER — HYDROCODONE-HOMATROPINE 5-1.5 MG/5ML PO SYRP: ORAL_SOLUTION | ORAL | 0 refills | Status: DC

## 2016-01-19 MED ORDER — SULFAMETHOXAZOLE-TRIMETHOPRIM 800-160 MG PO TABS: ORAL_TABLET | ORAL | 0 refills | Status: DC

## 2016-01-19 MED ORDER — FLUCONAZOLE 150 MG PO TABS
ORAL_TABLET | ORAL | 0 refills | Status: DC
Start: 2016-01-19 — End: 2016-03-26

## 2016-01-19 MED FILL — SULFAMETHOXAZOLE/TMP DS TAB: 800-160 | 10 days supply | Qty: 20 | Fill #0

## 2016-01-19 MED FILL — BENZONATATE 100 MG CAPSULE: 100 | 10 days supply | Qty: 30 | Fill #0

## 2016-01-19 MED FILL — FLUCONAZOLE 150 MG TABLET: 150 | 3 days supply | Qty: 2 | Fill #0

## 2016-01-19 MED FILL — KETOROLAC 10 MG TABLET: 10 | 5 days supply | Qty: 20 | Fill #0

## 2016-01-19 NOTE — Telephone Encounter (Signed)
Pt called stating that the antibiotics is not working pt is needing to know what else to do. Pt is also needing something for cough and yeast infection.    [facility]

## 2016-01-19 NOTE — Telephone Encounter (Signed)
Ref hycodan and tess. Bactrim ds bid ten d . Diflucan 150 one po three d apart

## 2016-01-19 NOTE — Telephone Encounter (Signed)
Seen 11/17 prescribed tessalon and augmentin. Pt finished meds. Coughing all day and night. Wants refill on tessalon and also hycodan for night time. No sob or wheezing, no fever, green nasal drainage, headache. Wants antibiotic also.

## 2016-01-19 NOTE — Telephone Encounter (Signed)
Spoke with patient and informed her per Dr.Steve Luking- Refills we sent in on Tessalon and we are also sending in Diflucan, Bactrim DS 1 tablet twice a day for 10 days, and a prescription for Hycodan cough syrup is ready for pick up. Patient verbalized understanding.

## 2016-01-20 MED FILL — HYDROCODONE-HOMATROPINE SYR: 5-1.5 | 18 days supply | Qty: 90 | Fill #0

## 2016-01-21 MED FILL — CONTRAVE ER 8-90 MG TABLET: 8-90 | 30 days supply | Qty: 120 | Fill #3

## 2016-01-21 MED FILL — ALPRAZolam 1 MG TABS: 1 | 30 days supply | Qty: 60 | Fill #1

## 2016-01-26 MED FILL — PROGESTERONE 200 MG CAPSULE: 200 | 30 days supply | Qty: 30 | Fill #2

## 2016-01-26 MED FILL — ARMOUR THYROID 15 MG TABLET: 15 | 30 days supply | Qty: 90 | Fill #2

## 2016-02-17 ENCOUNTER — Other Ambulatory Visit: Payer: Self-pay | Admitting: Nurse Practitioner

## 2016-02-17 MED FILL — CYCLOBENZAPRINE 10 MG TAB: 10 | 90 days supply | Qty: 90 | Fill #1

## 2016-02-17 MED FILL — ONDANSETRON ODT 4 MG TABLET: 4 | 10 days supply | Qty: 30 | Fill #0

## 2016-02-19 ENCOUNTER — Other Ambulatory Visit (HOSPITAL_COMMUNITY)
Admission: RE | Admit: 2016-02-19 | Discharge: 2016-02-19 | Disposition: A | Discharge status: Home / Self Care | Payer: 59 | Source: Ambulatory Visit | Attending: Physical Medicine and Rehabilitation | Admitting: Physical Medicine and Rehabilitation

## 2016-02-19 DIAGNOSIS — E559 Vitamin D deficiency, unspecified: Secondary | ICD-10-CM | POA: Diagnosis not present

## 2016-02-19 DIAGNOSIS — E539 Vitamin B deficiency, unspecified: Secondary | ICD-10-CM | POA: Insufficient documentation

## 2016-02-19 DIAGNOSIS — E038 Other specified hypothyroidism: Secondary | ICD-10-CM | POA: Insufficient documentation

## 2016-02-19 LAB — T4, FREE: Free T4: 0.8 ng/dL (ref 0.61–1.12)

## 2016-02-19 LAB — TSH: TSH: 0.088 u[IU]/mL — ABNORMAL LOW (ref 0.350–4.500)

## 2016-02-19 MED FILL — busPIRone HCL 5 MG TABS: 5 | 30 days supply | Qty: 30 | Fill #2

## 2016-02-19 MED FILL — CONTRAVE ER 8-90 MG TABLET: 8-90 | 30 days supply | Qty: 120 | Fill #4

## 2016-02-19 MED FILL — ALPRAZolam 1 MG TABS: 1 | 30 days supply | Qty: 60 | Fill #2

## 2016-02-20 LAB — VITAMIN D 25 HYDROXY (VIT D DEFICIENCY, FRACTURES): Vit D, 25-Hydroxy: 21.7 ng/mL — ABNORMAL LOW (ref 30.0–100.0)

## 2016-02-20 LAB — T3, FREE: T3, Free: 4.2 pg/mL (ref 2.0–4.4)

## 2016-02-24 LAB — T3, REVERSE: T3, Reverse: 16 ng/dL (ref 8–25)

## 2016-03-01 MED FILL — PROGESTERONE 200 MG CAPSULE: 200 | 30 days supply | Qty: 30 | Fill #3

## 2016-03-01 MED FILL — ARMOUR THYROID 15 MG TABLET: 15 | 30 days supply | Qty: 90 | Fill #3

## 2016-03-16 ENCOUNTER — Encounter: Payer: Self-pay | Admitting: Nurse Practitioner

## 2016-03-16 ENCOUNTER — Other Ambulatory Visit: Payer: Self-pay | Admitting: Nurse Practitioner

## 2016-03-16 MED ORDER — DICLOFENAC EPOLAMINE 1.3 % TD PTCH: 1.0000 | MEDICATED_PATCH | Freq: Two times a day (BID) | TRANSDERMAL | 0 refills | Status: DC

## 2016-03-16 MED ORDER — ESCITALOPRAM OXALATE 20 MG PO TABS: 20.0000 mg | ORAL_TABLET | Freq: Every day | ORAL | 1 refills | Status: DC

## 2016-03-16 MED FILL — FLECTOR 1.3% PATCH: 1.3 | 30 days supply | Qty: 60 | Fill #0

## 2016-03-17 MED FILL — ESCITALOPRAM 20 MG TABLET: 20 | 90 days supply | Qty: 90 | Fill #0

## 2016-03-18 MED FILL — RESTASIS MULTIDOSE 0.05% EY: 0.05 | 90 days supply | Qty: 17 | Fill #1

## 2016-03-26 ENCOUNTER — Other Ambulatory Visit (HOSPITAL_COMMUNITY)
Admission: RE | Admit: 2016-03-26 | Discharge: 2016-03-26 | Disposition: A | Discharge status: Home / Self Care | Payer: 59 | Source: Ambulatory Visit | Attending: Family Medicine | Admitting: Family Medicine

## 2016-03-26 ENCOUNTER — Encounter: Payer: Self-pay | Admitting: Family Medicine

## 2016-03-26 ENCOUNTER — Ambulatory Visit (INDEPENDENT_AMBULATORY_CARE_PROVIDER_SITE_OTHER): Payer: 59 | Admitting: Family Medicine

## 2016-03-26 VITALS — BP 148/96 | HR 96 | Temp 99.0°F | Ht 65.0 in | Wt 183.0 lb

## 2016-03-26 DIAGNOSIS — I10 Essential (primary) hypertension: Secondary | ICD-10-CM

## 2016-03-26 DIAGNOSIS — R000 Tachycardia, unspecified: Secondary | ICD-10-CM

## 2016-03-26 HISTORY — DX: Essential (primary) hypertension: I10

## 2016-03-26 LAB — TSH: TSH: 0.648 u[IU]/mL (ref 0.350–4.500)

## 2016-03-26 LAB — BASIC METABOLIC PANEL
Anion gap: 7 (ref 5–15)
BUN: 5 mg/dL — AB (ref 6–20)
CHLORIDE: 102 mmol/L (ref 101–111)
CO2: 30 mmol/L (ref 22–32)
Calcium: 8.9 mg/dL (ref 8.9–10.3)
Creatinine, Ser: 0.54 mg/dL (ref 0.44–1.00)
GFR calc Af Amer: >60 mL/min (ref >60)
GLUCOSE: 99 mg/dL (ref 65–99)
POTASSIUM: 3.3 mmol/L — AB (ref 3.5–5.1)
SODIUM: 139 mmol/L (ref 135–145)

## 2016-03-26 LAB — MAGNESIUM: Magnesium: 1.9 mg/dL (ref 1.7–2.4)

## 2016-03-26 MED ORDER — METOPROLOL SUCCINATE ER 50 MG PO TB24: 50.0000 mg | ORAL_TABLET | ORAL | 3 refills | Status: DC

## 2016-03-26 MED ORDER — HYDROCODONE-HOMATROPINE 5-1.5 MG/5ML PO SYRP: ORAL_SOLUTION | ORAL | 0 refills | Status: DC

## 2016-03-26 MED ORDER — AMOXICILLIN-POT CLAVULANATE 875-125 MG PO TABS: 1.0000 | ORAL_TABLET | Freq: Two times a day (BID) | ORAL | 0 refills | Status: DC

## 2016-03-26 MED FILL — metFORMIN HCL 1000 MG TABS: 1000 | 90 days supply | Qty: 180 | Fill #1

## 2016-03-26 MED FILL — ALPRAZolam 1 MG TABS: 1 | 30 days supply | Qty: 60 | Fill #3

## 2016-03-26 MED FILL — METOPROLOL SUCC ER 50 MG TA: 50 | 30 days supply | Qty: 30 | Fill #0

## 2016-03-26 MED FILL — HYDROCODONE-HOMATROPINE SYR: 5-1.5 | 18 days supply | Qty: 90 | Fill #0

## 2016-03-26 MED FILL — AMOX-CLAV 875-125 MG TABLET: 875-125 | 10 days supply | Qty: 20 | Fill #0

## 2016-03-26 NOTE — Progress Notes (Signed)
   Subjective:    Patient ID: [name], female    DOB: [birth year], 46 y.o.   MRN: [id number] Patient presents with numerous concerns Sinusitis  This is a new problem. Episode onset: 2 weeks. Associated symptoms include congestion, coughing, headaches and a sore throat. (Fever, wheezing) Treatments tried: mucinex.  Notes cough congestion drainage. Worse last few weeks. Positive productive. Achy frontal headache intermittently worse with change of position BP high for the past 2 months.   Pulse rate often up the past month  h r still elevated t 110 or 120   BP s often high the past few weeks  Taking mucines and tylenol  130 over 94  fam hx of high blood pressure  So so on the exercise Concerned about thyroid. Had a low TSH a month ago.  Shoulders aching at time  Pulse rate in the 130's and 140's. Pt stopped contrave and was started on lexapro and pulse has come down some.    Review of Systems  HENT: Positive for congestion and sore throat.   Respiratory: Positive for cough.   Neurological: Positive for headaches.       Objective:   Physical Exam Alert initial heart rate 110-115 bpm anxious appearing blood pressure up 148/92. HEENT moderate nasal congestion. Lungs clear. Heart regular in rhythm.  EKG borderline sinus tachycardia no significant ST-T changes     Assessment & Plan:  Impression 1 subacute rhinosinusitis discussed #2 tachyarrhythmia sinus tach. Likely exacerbated by anxiety question if related to #3. #3 question hypothyroidism #4 elevated blood pressure discussed strong family history blood pressure has been borderline at times past couple years plan antibiotics prescribed., Appropriate blood work. Initiate Toprol 50 XL every morning. Symptom care discussed follow-up as scheduled.

## 2016-03-26 NOTE — Patient Instructions (Signed)
Hypertension Hypertension, commonly called high blood pressure, is when the force of blood pumping through your arteries is too strong. Your arteries are the blood vessels that carry blood from your heart throughout your body. A blood pressure reading consists of a higher number over a lower number, such as 110/72. The higher number (systolic) is the pressure inside your arteries when your heart pumps. The lower number (diastolic) is the pressure inside your arteries when your heart relaxes. Ideally you want your blood pressure below 120/80. Hypertension forces your heart to work harder to pump blood. Your arteries may become narrow or stiff. Having untreated or uncontrolled hypertension can cause heart attack, stroke, kidney disease, and other problems. What increases the risk? Some risk factors for high blood pressure are controllable. Others are not. Risk factors you cannot control include:  Race. You may be at higher risk if you are African American.  Age. Risk increases with age.  Gender. Men are at higher risk than women before age 45 years. After age 65, women are at higher risk than men. Risk factors you can control include:  Not getting enough exercise or physical activity.  Being overweight.  Getting too much fat, sugar, calories, or salt in your diet.  Drinking too much alcohol. What are the signs or symptoms? Hypertension does not usually cause signs or symptoms. Extremely high blood pressure (hypertensive crisis) may cause headache, anxiety, shortness of breath, and nosebleed. How is this diagnosed? To check if you have hypertension, your health care provider will measure your blood pressure while you are seated, with your arm held at the level of your heart. It should be measured at least twice using the same arm. Certain conditions can cause a difference in blood pressure between your right and left arms. A blood pressure reading that is higher than normal on one occasion does  not mean that you need treatment. If it is not clear whether you have high blood pressure, you may be asked to return on a different day to have your blood pressure checked again. Or, you may be asked to monitor your blood pressure at home for 1 or more weeks. How is this treated? Treating high blood pressure includes making lifestyle changes and possibly taking medicine. Living a healthy lifestyle can help lower high blood pressure. You may need to change some of your habits. Lifestyle changes may include:  Following the DASH diet. This diet is high in fruits, vegetables, and whole grains. It is low in salt, red meat, and added sugars.  Keep your sodium intake below 2,300 mg per day.  Getting at least 30-45 minutes of aerobic exercise at least 4 times per week.  Losing weight if necessary.  Not smoking.  Limiting alcoholic beverages.  Learning ways to reduce stress. Your health care provider may prescribe medicine if lifestyle changes are not enough to get your blood pressure under control, and if one of the following is true:  You are 18-59 years of age and your systolic blood pressure is above 140.  You are 60 years of age or older, and your systolic blood pressure is above 150.  Your diastolic blood pressure is above 90.  You have diabetes, and your systolic blood pressure is over 140 or your diastolic blood pressure is over 90.  You have kidney disease and your blood pressure is above 140/90.  You have heart disease and your blood pressure is above 140/90. Your personal target blood pressure may vary depending on your medical   conditions, your age, and other factors. Follow these instructions at home:  Have your blood pressure rechecked as directed by your health care provider.  Take medicines only as directed by your health care provider. Follow the directions carefully. Blood pressure medicines must be taken as prescribed. The medicine does not work as well when you skip  doses. Skipping doses also puts you at risk for problems.  Do not smoke.  Monitor your blood pressure at home as directed by your health care provider. Contact a health care provider if:  You think you are having a reaction to medicines taken.  You have recurrent headaches or feel dizzy.  You have swelling in your ankles.  You have trouble with your vision. Get help right away if:  You develop a severe headache or confusion.  You have unusual weakness, numbness, or feel faint.  You have severe chest or abdominal pain.  You vomit repeatedly.  You have trouble breathing. This information is not intended to replace advice given to you by your health care provider. Make sure you discuss any questions you have with your health care provider. Document Released: 02/08/2005 Document Revised: 07/17/2015 Document Reviewed: 12/01/2012 Elsevier Interactive Patient Education  2017 Elsevier Inc.  

## 2016-03-27 LAB — T4: T4, Total: 7.1 ug/dL (ref 4.5–12.0)

## 2016-03-29 ENCOUNTER — Other Ambulatory Visit: Payer: Self-pay

## 2016-03-29 MED ORDER — POTASSIUM CHLORIDE ER 20 MEQ PO TBCR: EXTENDED_RELEASE_TABLET | ORAL | 1 refills | Status: DC

## 2016-03-29 MED FILL — KLOR-CON M20 TABLET: 20 | 90 days supply | Qty: 90 | Fill #0

## 2016-03-30 DIAGNOSIS — L988 Other specified disorders of the skin and subcutaneous tissue: Secondary | ICD-10-CM | POA: Diagnosis not present

## 2016-03-30 DIAGNOSIS — D485 Neoplasm of uncertain behavior of skin: Secondary | ICD-10-CM | POA: Diagnosis not present

## 2016-04-01 ENCOUNTER — Encounter: Payer: Self-pay | Admitting: Family Medicine

## 2016-04-02 ENCOUNTER — Telehealth: Payer: Self-pay | Admitting: Family Medicine

## 2016-04-02 ENCOUNTER — Ambulatory Visit: Payer: 59 | Admitting: Nurse Practitioner

## 2016-04-02 MED ORDER — DOXYCYCLINE HYCLATE 100 MG PO TABS: ORAL_TABLET | ORAL | 0 refills | Status: DC

## 2016-04-02 MED ORDER — FLUCONAZOLE 150 MG PO TABS: ORAL_TABLET | ORAL | 1 refills | Status: DC

## 2016-04-02 MED FILL — DOXYCYCLINE HYCLATE 100 MG: 100 | 7 days supply | Qty: 14 | Fill #0

## 2016-04-02 MED FILL — FLUCONAZOLE 150 MG TABLET: 150 | 2 days supply | Qty: 2 | Fill #0

## 2016-04-02 NOTE — Telephone Encounter (Signed)
Spoke with patient and verified that patient was not having any high fevers or difficulty breathing. Informed patient per Dr.Scott Luking we are sending in Diflucan and Doxycycline to the pharmacy. Patient verbalized understanding.

## 2016-04-02 NOTE — Telephone Encounter (Signed)
Nurse's-please talk with the patient if she is experiencing high fevers difficulty breathing in her lungs she should be rechecked. If it is more that she just isn't totally shaking this then I would recommend the following. Please document appropriately. Diflucan 150 mg #1, take 1 now, may have 2 refills. Doxycycline 100 mg 1 twice a day for the next 7 days. If ongoing troubles follow-up.

## 2016-04-02 NOTE — Telephone Encounter (Signed)
Patient was put on Augmentin on 03/26/16 by Dr. Richardson Landry.  She said she has had diarrhea all week and now a yeast infection.  [facility]

## 2016-04-22 MED FILL — METOPROLOL SUCC ER 50 MG TA: 50 | 30 days supply | Qty: 30 | Fill #1

## 2016-04-23 MED FILL — ALPRAZolam 1 MG TABS: 1 | 30 days supply | Qty: 60 | Fill #4

## 2016-04-30 ENCOUNTER — Ambulatory Visit: Payer: 59 | Admitting: Nurse Practitioner

## 2016-05-07 ENCOUNTER — Ambulatory Visit: Payer: 59 | Admitting: Family Medicine

## 2016-05-18 MED FILL — METOPROLOL SUCC ER 50 MG TA: 50 | 30 days supply | Qty: 30 | Fill #2

## 2016-05-21 ENCOUNTER — Ambulatory Visit (INDEPENDENT_AMBULATORY_CARE_PROVIDER_SITE_OTHER): Payer: 59 | Admitting: Family Medicine

## 2016-05-21 ENCOUNTER — Encounter: Payer: Self-pay | Admitting: Family Medicine

## 2016-05-21 VITALS — BP 134/82 | HR 105 | Ht 65.0 in | Wt 184.1 lb

## 2016-05-21 DIAGNOSIS — I10 Essential (primary) hypertension: Secondary | ICD-10-CM

## 2016-05-21 DIAGNOSIS — R000 Tachycardia, unspecified: Secondary | ICD-10-CM | POA: Diagnosis not present

## 2016-05-21 MED ORDER — FLUTICASONE PROPIONATE 50 MCG/ACT NA SUSP: 2.0000 | Freq: Every day | NASAL | 5 refills | Status: DC

## 2016-05-21 MED ORDER — CETIRIZINE HCL 10 MG PO TABS: 10.0000 mg | ORAL_TABLET | Freq: Every day | ORAL | 5 refills | Status: DC

## 2016-05-21 MED ORDER — METOPROLOL SUCCINATE ER 50 MG PO TB24: 50.0000 mg | ORAL_TABLET | ORAL | 5 refills | Status: DC

## 2016-05-21 MED FILL — FLUTICASONE PROP 50 MCG SPR: 50 | 30 days supply | Qty: 16 | Fill #0

## 2016-05-21 NOTE — Progress Notes (Signed)
   Subjective:    Patient ID: [name], female    DOB: 1970/11/17, 46 y.o.   MRN: [id number]  Hypertension  This is a chronic problem. The current episode started more than 1 year ago. (Tachycardia )   Patient in today for a 6 week follow up and tachycardia.   Blood pressure medicine and blood pressure levels reviewed today with patient. Compliant with blood pressure medicine. States does not miss a dose. No obvious side effects. Blood pressure generally good when checked elsewhere. Watching salt intake.  Some ffatigue, heart not racing like before, hr ususlaly under 100                                                                                                                                                                                                                                                                         States no other concerns this visit.   Review of Systems No headache, no major weight loss or weight gain, no chest pain no back pain abdominal pain no change in bowel habits complete ROS otherwise negative     Objective:   Physical Exam   Alert vitals stable, NAD. Blood pressure good on repeat. HEENT normal. Lungs clear. Heart regular rate and rhythm.      Assessment & Plan:  Impression #1 hypertension improved on her current medication discussed #2 tachycardia improved on her current medication discussed plan diet exercise discussed. Maintain same dose. Track blood pressures. Follow-up as scheduled. [facility] medications refilled

## 2016-05-24 MED FILL — ALPRAZolam 1 MG TABS: 1 | 30 days supply | Qty: 60 | Fill #5

## 2016-05-30 ENCOUNTER — Encounter: Payer: Self-pay | Admitting: Nurse Practitioner

## 2016-06-04 ENCOUNTER — Other Ambulatory Visit (HOSPITAL_COMMUNITY)
Admission: RE | Admit: 2016-06-04 | Discharge: 2016-06-04 | Disposition: A | Discharge status: Home / Self Care | Payer: 59 | Source: Ambulatory Visit | Attending: Physical Medicine and Rehabilitation | Admitting: Physical Medicine and Rehabilitation

## 2016-06-04 DIAGNOSIS — N951 Menopausal and female climacteric states: Secondary | ICD-10-CM | POA: Insufficient documentation

## 2016-06-04 DIAGNOSIS — E0789 Other specified disorders of thyroid: Secondary | ICD-10-CM | POA: Diagnosis not present

## 2016-06-04 DIAGNOSIS — E349 Endocrine disorder, unspecified: Secondary | ICD-10-CM | POA: Insufficient documentation

## 2016-06-04 LAB — VITAMIN B12: Vitamin B-12: 213 pg/mL (ref 180–914)

## 2016-06-05 LAB — ZINC: ZINC: 83 ug/dL (ref 56–134)

## 2016-06-05 LAB — TESTOSTERONE,FREE AND TOTAL
TESTOSTERONE FREE: 1.8 pg/mL (ref 0.0–4.2)
TESTOSTERONE: 38 ng/dL (ref 8–48)

## 2016-06-05 LAB — T3, FREE: T3 FREE: 2.8 pg/mL (ref 2.0–4.4)

## 2016-06-05 LAB — VITAMIN D 25 HYDROXY (VIT D DEFICIENCY, FRACTURES): VIT D 25 HYDROXY: 49.2 ng/mL (ref 30.0–100.0)

## 2016-06-05 LAB — ESTRADIOL: ESTRADIOL: 12.5 pg/mL

## 2016-06-07 LAB — MISC LABCORP TEST (SEND OUT): LABCORP TEST CODE: 716910

## 2016-06-10 ENCOUNTER — Other Ambulatory Visit: Payer: Self-pay | Admitting: Nurse Practitioner

## 2016-06-10 MED ORDER — NALTREXONE-BUPROPION HCL ER 8-90 MG PO TB12: ORAL_TABLET | ORAL | 2 refills | Status: DC

## 2016-06-15 MED FILL — METOPROLOL SUCC ER 50 MG TA: 50 | 30 days supply | Qty: 30 | Fill #3

## 2016-06-15 MED FILL — RESTASIS MULTIDOSE 0.05% EY: 0.05 | 90 days supply | Qty: 17 | Fill #2

## 2016-06-16 ENCOUNTER — Other Ambulatory Visit: Payer: Self-pay | Admitting: Nurse Practitioner

## 2016-06-16 MED ORDER — ALPRAZOLAM 1 MG PO TABS: 1.0000 mg | ORAL_TABLET | Freq: Two times a day (BID) | ORAL | 5 refills | Status: DC | PRN

## 2016-06-18 ENCOUNTER — Ambulatory Visit (INDEPENDENT_AMBULATORY_CARE_PROVIDER_SITE_OTHER): Payer: 59 | Admitting: Nurse Practitioner

## 2016-06-18 ENCOUNTER — Encounter: Payer: Self-pay | Admitting: Nurse Practitioner

## 2016-06-18 ENCOUNTER — Ambulatory Visit: Payer: 59 | Admitting: Nurse Practitioner

## 2016-06-18 VITALS — BP 128/86 | Ht 65.0 in | Wt 193.6 lb

## 2016-06-18 DIAGNOSIS — R000 Tachycardia, unspecified: Secondary | ICD-10-CM | POA: Diagnosis not present

## 2016-06-18 DIAGNOSIS — I10 Essential (primary) hypertension: Secondary | ICD-10-CM

## 2016-06-18 MED FILL — ESCITALOPRAM 20 MG TABLET: 20 | 90 days supply | Qty: 90 | Fill #1

## 2016-06-18 NOTE — Progress Notes (Signed)
Subjective:  Presents for recheck on HTN and weight. Has been off Contrave since December. Continued to have elevated BP and pulse so started on Toprol XL on 3/30. BP and pulse much improved but experiencing significant fatigue and exhaustion. Decreased activity and exercise as a result which has contributed to recent weight gain over the past month. No other factors noted to contribute to this. Would like to restart Contrave since she lost weight and maintained it until she started Toprol.   Objective:   BP 128/86   Ht 5\' 5"  (1.651 m)   Wt 193 lb 9.6 oz (87.8 kg)   BMI 32.22 kg/m  NAD. Alert, oriented. Lungs clear. Heart RRR. Her pulse monitor shows consistent pulse in the 80's. Review of recent labs does not show any reason for weight gain.   Assessment:   Problem List Items Addressed This Visit      Cardiovascular and Mediastinum   Hypertension not at goal     Other   Morbid obesity ([place])    Other Visit Diagnoses    Tachycardia    -  Primary       Plan:  Decrease Toprol XL to 1/2 tab (25 mg) continue to monitor BP and pulse. Also monitor fatigue. Call back if no improvement on new dose.  Will have nurses send over information for PA for Contrave so she can restart. Return in about 3 months (around 09/17/2016) for recheck.

## 2016-06-21 MED FILL — ALPRAZolam 1 MG TABS: 1 | 30 days supply | Qty: 60 | Fill #0

## 2016-06-27 ENCOUNTER — Encounter: Payer: Self-pay | Admitting: Nurse Practitioner

## 2016-07-02 ENCOUNTER — Ambulatory Visit: Payer: 59 | Admitting: Nurse Practitioner

## 2016-07-07 ENCOUNTER — Telehealth: Payer: Self-pay | Admitting: Family Medicine

## 2016-07-07 NOTE — Telephone Encounter (Signed)
Patient is requesting a change in her blood pressure medication.  She said she still feels really tired and unable to exercise.  She said she has gained almost 17 pounds since she has been on the metoprolol succinate (TOPROL XL) 50 MG 24 hr tablet.  She said she is drinking a lot of water and watching what she eats.  [facility]

## 2016-07-08 ENCOUNTER — Encounter: Payer: Self-pay | Admitting: Family Medicine

## 2016-07-08 MED ORDER — DILTIAZEM HCL ER COATED BEADS 240 MG PO CP24: 240.0000 mg | ORAL_CAPSULE | Freq: Every day | ORAL | 0 refills | Status: DC

## 2016-07-08 MED FILL — CARTIA XT 240 MG CAPSULE: 240 | 90 days supply | Qty: 90 | Fill #0

## 2016-07-08 NOTE — Telephone Encounter (Signed)
Spoke with patient and informed her per Dr.Steve Luking- we are changing medication to Cardizem 240 once a day. Patient verbalized understanding.

## 2016-07-08 NOTE — Telephone Encounter (Signed)
Stop current, start cardizem 240 q d

## 2016-07-16 ENCOUNTER — Encounter: Payer: Self-pay | Admitting: Nurse Practitioner

## 2016-07-20 ENCOUNTER — Encounter: Payer: Self-pay | Admitting: Family Medicine

## 2016-07-21 ENCOUNTER — Telehealth: Payer: Self-pay | Admitting: Family Medicine

## 2016-07-21 NOTE — Telephone Encounter (Signed)
Denial letter received from [place] for pt's  Naltrexone-Bupropion HCl ER (CONTRAVE) 8-90 MG  Letter states that criteria was not met due to no documentation of patent eing enrolled in an exercise & caloric reduction program or a weight loss/behavioral modification program.   (letter is filed in [place] binder in nurses station)  There are instructions to file an appeal, please advise

## 2016-07-22 NOTE — Telephone Encounter (Signed)
Mychart note sent to patient.

## 2016-07-23 ENCOUNTER — Other Ambulatory Visit: Payer: Self-pay | Admitting: Nurse Practitioner

## 2016-07-23 ENCOUNTER — Encounter: Payer: Self-pay | Admitting: Family Medicine

## 2016-07-23 ENCOUNTER — Ambulatory Visit (INDEPENDENT_AMBULATORY_CARE_PROVIDER_SITE_OTHER): Payer: 59 | Admitting: Family Medicine

## 2016-07-23 VITALS — BP 130/82 | HR 96 | Ht 65.0 in | Wt 194.2 lb

## 2016-07-23 DIAGNOSIS — I10 Essential (primary) hypertension: Secondary | ICD-10-CM

## 2016-07-23 DIAGNOSIS — R000 Tachycardia, unspecified: Secondary | ICD-10-CM

## 2016-07-23 MED ORDER — METOPROLOL SUCCINATE ER 25 MG PO TB24
25.0000 mg | ORAL_TABLET | Freq: Every day | ORAL | 1 refills | Status: DC
Start: 2016-07-23 — End: 2016-09-28

## 2016-07-23 MED ORDER — VALSARTAN 80 MG PO TABS: 80.0000 mg | ORAL_TABLET | Freq: Every day | ORAL | 1 refills | Status: DC

## 2016-07-23 MED FILL — POTASSIUM CL ER 20 MEQ TABL: 20 | 90 days supply | Qty: 90 | Fill #1

## 2016-07-23 MED FILL — ALPRAZolam 1 MG TABS: 1 | 30 days supply | Qty: 60 | Fill #1

## 2016-07-23 MED FILL — VALSARTAN 80 MG TABLET: 80 | 90 days supply | Qty: 90 | Fill #0

## 2016-07-23 MED FILL — metFORMIN HCL 1000 MG TABS: 1000 | 90 days supply | Qty: 180 | Fill #0

## 2016-07-23 MED FILL — METOPROLOL SUCC ER 25 MG TA: 25 | 90 days supply | Qty: 90 | Fill #0

## 2016-07-23 NOTE — Progress Notes (Signed)
   Subjective:    Patient ID: [name], female    DOB: Jul 19, 1970, 46 y.o.   MRN: [id number]  HPI  Patient arrives for follow upon blood pressure and pulse. Patient states her blood pressure is running high as well as her pulse on the new medication.  Stress level overall is up and down  Involved in court battles, had to go t  emerg court and work on custody issues , Therefore feeling a lot of stress.  Entire recent history regarding tachycardia, elevated blood pressure, change of medications, potential side effects and sensitivities, are all discussed at great length. Next  Patient means well but she is not the best historian. We had areas of concern where patient's history did not fit the clinical record.  Basically the Toprol 50 XL caused too much symptoms. Patient was instructed to cut this dose in half. She never did. Next  Now we will initiate calcium channel blockers. Cardizem specifically. Patient experiencing unfortunately a fairly classic vascular headache in the evening after taking the medicine.    Review of Systems No headache, no major weight loss or weight gain, no chest pain no back pain abdominal pain no change in bowel habits complete ROS otherwise negative     Objective:   Physical Exam  Alert and oriented, vitals reviewed and stable, NAD ENT-TM's and ext canals WNL bilat via otoscopic exam Soft palate, tonsils and post pharynx WNL via oropharyngeal exam Neck-symmetric, no masses; thyroid nonpalpable and nontender Pulmonary-no tachypnea or accessory muscle use; Clear without wheezes via auscultation Card--no abnrml murmurs, rhythm reg and rate WNL Carotid pulses symmetric, without bruits       Assessment & Plan:  Impression hypertension. Now decent control but unfortunately unsatisfactory side effects. #3 and #4 #2 tachycardia intermittent mild in nature. I do not feel this represents significant cardiac disease discussed #3 increased stress certainly  compliant with everything #4 patient confusion regarding instructions which led to suboptimal compliance plan Toprol-XL revisit at lower dose. Avoid Cardizem and future. Add angiotensin blocker rationale discussed. Diet exercise discussed. Monitor blood pressures  Greater than 50% of this 25 minute face to face visit was spent in counseling and discussion and coordination of care regarding the above diagnosis/diagnosies

## 2016-08-23 MED FILL — ALPRAZolam 1 MG TABS: 1 | 30 days supply | Qty: 60 | Fill #2

## 2016-09-09 ENCOUNTER — Other Ambulatory Visit (HOSPITAL_COMMUNITY)
Admission: RE | Admit: 2016-09-09 | Discharge: 2016-09-09 | Disposition: A | Discharge status: Home / Self Care | Payer: 59 | Source: Ambulatory Visit | Attending: Nurse Practitioner | Admitting: Nurse Practitioner

## 2016-09-09 ENCOUNTER — Ambulatory Visit (INDEPENDENT_AMBULATORY_CARE_PROVIDER_SITE_OTHER): Payer: 59 | Admitting: Nurse Practitioner

## 2016-09-09 ENCOUNTER — Encounter: Payer: Self-pay | Admitting: Nurse Practitioner

## 2016-09-09 VITALS — BP 108/84 | HR 82 | Temp 98.4°F | Ht 65.0 in | Wt 194.0 lb

## 2016-09-09 DIAGNOSIS — R7301 Impaired fasting glucose: Secondary | ICD-10-CM

## 2016-09-09 DIAGNOSIS — G629 Polyneuropathy, unspecified: Secondary | ICD-10-CM | POA: Insufficient documentation

## 2016-09-09 DIAGNOSIS — R5383 Other fatigue: Secondary | ICD-10-CM | POA: Diagnosis not present

## 2016-09-09 DIAGNOSIS — G608 Other hereditary and idiopathic neuropathies: Secondary | ICD-10-CM

## 2016-09-09 LAB — CBC WITH DIFFERENTIAL/PLATELET
Basophils Absolute: 0 10*3/uL (ref 0.0–0.1)
Basophils Relative: 1 %
EOS ABS: 0.5 10*3/uL (ref 0.0–0.7)
EOS PCT: 7 %
HCT: 41.2 % (ref 36.0–46.0)
Hemoglobin: 14.1 g/dL (ref 12.0–15.0)
LYMPHS ABS: 2.3 10*3/uL (ref 0.7–4.0)
Lymphocytes Relative: 35 %
MCH: 30.9 pg (ref 26.0–34.0)
MCHC: 34.2 g/dL (ref 30.0–36.0)
MCV: 90.2 fL (ref 78.0–100.0)
MONO ABS: 0.4 10*3/uL (ref 0.1–1.0)
MONOS PCT: 6 %
Neutro Abs: 3.5 10*3/uL (ref 1.7–7.7)
Neutrophils Relative %: 51 %
PLATELETS: 335 10*3/uL (ref 150–400)
RBC: 4.57 MIL/uL (ref 3.87–5.11)
RDW: 12.8 % (ref 11.5–15.5)
WBC: 6.7 10*3/uL (ref 4.0–10.5)

## 2016-09-09 LAB — VITAMIN B12: Vitamin B-12: 295 pg/mL (ref 180–914)

## 2016-09-09 LAB — FERRITIN: Ferritin: 108 ng/mL (ref 11–307)

## 2016-09-09 MED ORDER — GABAPENTIN 300 MG PO CAPS: 300.0000 mg | ORAL_CAPSULE | Freq: Two times a day (BID) | ORAL | 2 refills | Status: DC

## 2016-09-09 MED FILL — GABAPENTIN 300 MG CAPSULE: 300 | 30 days supply | Qty: 60 | Fill #0

## 2016-09-09 NOTE — Progress Notes (Signed)
Subjective:  Presents for complaints of bilateral burning coldness and tingling in the feet for the past month. Pain and burning much worse at nighttime. States her feet feel cold to her. Tingling and numbness during the day. Mild edema occasionally. Occurs equally in both feet. Does not involve the hands. Mild fatigue but this could be related to patient having difficulty sleeping, was recently laid off from her job.  Objective:   BP 108/84   Pulse 82   Temp 98.4 F (36.9 C) (Oral)   Ht 5\' 5"  (1.651 m)   Wt 194 lb 0.2 oz (88 kg)   BMI 32.29 kg/m  NAD. Alert, oriented. Lungs clear. Heart regular rate rhythm. Femoral pulses present bilaterally. DP pulses present in both feet. Feet are very cool with mild dependent cyanosis. Capillary refill less than 5 seconds. Monofilament test sensation is absent in the toes present in the foot. Gait normal. Hands slightly cool but strong radial pulses.  Assessment:   Problem List Items Addressed This Visit      Endocrine   Impaired fasting glucose   Relevant Orders   Hemoglobin A1c    Other Visit Diagnoses    Peripheral sensory neuropathy    -  Primary   Relevant Medications   gabapentin (NEURONTIN) 300 MG capsule   Other Relevant Orders   Vitamin B12   Fatigue, unspecified type       Relevant Orders   Ferritin   CBC with Differential/Platelet       Plan:   Meds ordered this encounter  Medications  . gabapentin (NEURONTIN) 300 MG capsule    Sig: Take 1 capsule (300 mg total) by mouth 2 (two) times daily.    Dispense:  60 capsule    Refill:  2    Order Specific Question:   Supervising Provider    Answer:   Maggie Font   Lab work pending. Further follow-up based on results. Warning signs reviewed. Patient to call back in the meantime if symptoms worsen or new symptoms develop.

## 2016-09-10 LAB — HEMOGLOBIN A1C
HEMOGLOBIN A1C: 5.9 % — AB (ref 4.8–5.6)
MEAN PLASMA GLUCOSE: 123 mg/dL

## 2016-09-14 ENCOUNTER — Other Ambulatory Visit: Payer: Self-pay

## 2016-09-14 MED ORDER — CYCLOBENZAPRINE HCL 10 MG PO TABS: ORAL_TABLET | ORAL | 2 refills | Status: DC

## 2016-09-14 MED FILL — CYCLOBENZAPRINE 10 MG TAB: 10 | 90 days supply | Qty: 90 | Fill #0

## 2016-09-14 MED FILL — RESTASIS MULTIDOSE 0.05% EY: 0.05 | 90 days supply | Qty: 17 | Fill #3

## 2016-09-14 MED FILL — FLUTICASONE PROP 50 MCG SPR: 50 | 30 days supply | Qty: 16 | Fill #1

## 2016-09-14 NOTE — Progress Notes (Signed)
Ok plus two ref 

## 2016-09-14 NOTE — Progress Notes (Unsigned)
Last seen 09/09/16 

## 2016-09-15 ENCOUNTER — Encounter: Payer: Self-pay | Admitting: Nurse Practitioner

## 2016-09-17 ENCOUNTER — Ambulatory Visit: Payer: 59 | Admitting: Nurse Practitioner

## 2016-09-21 MED FILL — ALPRAZolam 1 MG TABS: 1 | 30 days supply | Qty: 60 | Fill #3

## 2016-09-28 ENCOUNTER — Encounter: Payer: Self-pay | Admitting: Family Medicine

## 2016-09-28 ENCOUNTER — Ambulatory Visit (INDEPENDENT_AMBULATORY_CARE_PROVIDER_SITE_OTHER): Payer: 59 | Admitting: Family Medicine

## 2016-09-28 VITALS — BP 138/90 | HR 94 | Ht 65.0 in | Wt 201.0 lb

## 2016-09-28 DIAGNOSIS — G629 Polyneuropathy, unspecified: Secondary | ICD-10-CM | POA: Diagnosis not present

## 2016-09-28 DIAGNOSIS — I10 Essential (primary) hypertension: Secondary | ICD-10-CM | POA: Diagnosis not present

## 2016-09-28 DIAGNOSIS — F329 Major depressive disorder, single episode, unspecified: Secondary | ICD-10-CM | POA: Diagnosis not present

## 2016-09-28 DIAGNOSIS — F419 Anxiety disorder, unspecified: Secondary | ICD-10-CM

## 2016-09-28 DIAGNOSIS — G608 Other hereditary and idiopathic neuropathies: Secondary | ICD-10-CM

## 2016-09-28 MED ORDER — METOPROLOL SUCCINATE ER 25 MG PO TB24: 25.0000 mg | ORAL_TABLET | Freq: Every day | ORAL | 1 refills | Status: DC

## 2016-09-28 MED ORDER — GABAPENTIN 300 MG PO CAPS: ORAL_CAPSULE | ORAL | 1 refills | Status: DC

## 2016-09-28 MED ORDER — LOSARTAN POTASSIUM 50 MG PO TABS: ORAL_TABLET | ORAL | 1 refills | Status: DC

## 2016-09-28 MED FILL — LOSARTAN POTASSIUM 50 MG TA: 50 | 90 days supply | Qty: 90 | Fill #0

## 2016-09-28 NOTE — Progress Notes (Signed)
   Subjective:    Patient ID: [name], female    DOB: 02-21-1971, 46 y.o.   MRN: [id number]  Hypertension  This is a chronic problem. The current episode started more than 1 year ago.   Blood pressure medicine and blood pressure levels reviewed today with patient. Compliant with blood pressure medicine. States does not miss a dose. No obvious side effects. Blood pressure generally good when checked elsewhere. Watching salt intake.   Numbness continues to be worse in the distal feet primarily plantar service both sides. Also accompanied by sharp shooting pains that come and go worse in the evening.   Gabapentin definitely helping with the numbness and the pain  No drosiness with the gabpentin   Symmetric bilat   symtoms worse in the eve  Needs new script for the Gabapentin, increased per Carolyns instructions. No concerns.  Patient reports ongoing anxiety. Not much in terms of depression. States definitely needs a 81 M no obvious side effects.  Review of Systems No headache, no major weight loss or weight gain, no chest pain no back pain abdominal pain no change in bowel habits complete ROS otherwise negative     Objective:   Physical Exam  Alert and oriented, vitals reviewed and stable, NAD ENT-TM's and ext canals WNL bilat via otoscopic exam Soft palate, tonsils and post pharynx WNL via oropharyngeal exam Neck-symmetric, no masses; thyroid nonpalpable and nontender Pulmonary-no tachypnea or accessory muscle use; Clear without wheezes via auscultation Card--no abnrml murmurs, rhythm reg and rate WNL Carotid pulses symmetric, without bruits Feet sensation diminished plantar service bilateral pulses strong      Assessment & Plan:  Impression #1 sensory neuropathy discussed at great length. Most likely etiology is #2. Patient advised at 60 folks to develop diabetic neuropathy actually developed a neuropathy before they all 5 her true diabetes diagnosis #2  prediabetes long discussion held patient wonders if she needs metformin deftly encouraged #3 hypertension ongoing challenges discussed maintain same #4 anxiety with element of depression. Clinically stable. Benefits from medicines medicines refilled. Diet exercise discussed. Follow-up as scheduled.

## 2016-09-30 DIAGNOSIS — G629 Polyneuropathy, unspecified: Secondary | ICD-10-CM

## 2016-09-30 DIAGNOSIS — G608 Other hereditary and idiopathic neuropathies: Secondary | ICD-10-CM | POA: Insufficient documentation

## 2016-10-04 MED FILL — GABAPENTIN 300 MG CAPS: 300 | 30 days supply | Qty: 60 | Fill #1

## 2016-10-19 MED FILL — GABAPENTIN 300 MG CAPSULE: 300 | 90 days supply | Qty: 360 | Fill #0

## 2016-10-21 MED FILL — METOPROLOL SUCC ER 25 MG TA: 25 | 90 days supply | Qty: 90 | Fill #1

## 2016-10-21 MED FILL — ALPRAZolam 1 MG TABS: 1 | 30 days supply | Qty: 60 | Fill #4

## 2016-10-21 MED FILL — metFORMIN HCL 1000 MG TABS: 1000 | 90 days supply | Qty: 180 | Fill #1

## 2016-10-22 ENCOUNTER — Ambulatory Visit: Payer: 59 | Admitting: Family Medicine

## 2016-10-29 ENCOUNTER — Ambulatory Visit: Payer: 59 | Admitting: Family Medicine

## 2016-11-19 ENCOUNTER — Ambulatory Visit (INDEPENDENT_AMBULATORY_CARE_PROVIDER_SITE_OTHER): Payer: 59 | Admitting: Nurse Practitioner

## 2016-11-19 ENCOUNTER — Telehealth: Payer: Self-pay | Admitting: Nurse Practitioner

## 2016-11-19 ENCOUNTER — Encounter: Payer: Self-pay | Admitting: Nurse Practitioner

## 2016-11-19 ENCOUNTER — Ambulatory Visit: Payer: 59 | Admitting: Family Medicine

## 2016-11-19 VITALS — BP 100/80 | Ht 65.0 in | Wt 197.1 lb

## 2016-11-19 DIAGNOSIS — L719 Rosacea, unspecified: Secondary | ICD-10-CM

## 2016-11-19 DIAGNOSIS — S0501XA Injury of conjunctiva and corneal abrasion without foreign body, right eye, initial encounter: Secondary | ICD-10-CM | POA: Diagnosis not present

## 2016-11-19 MED ORDER — SULFACETAMIDE SODIUM 10 % OP SOLN: 1.0000 [drp] | OPHTHALMIC | 0 refills | Status: DC

## 2016-11-19 MED ORDER — MINOCYCLINE HCL 100 MG PO CAPS: 100.0000 mg | ORAL_CAPSULE | Freq: Two times a day (BID) | ORAL | 2 refills | Status: DC

## 2016-11-19 MED FILL — MINOCYCLINE 100 MG CAPSULE: 100 | 30 days supply | Qty: 60 | Fill #0

## 2016-11-19 MED FILL — ALPRAZolam 1 MG TABS: 1 | 30 days supply | Qty: 60 | Fill #5

## 2016-11-19 NOTE — Telephone Encounter (Signed)
Patient states she wonders if her blood pressure med could be causing he facial redness

## 2016-11-19 NOTE — Telephone Encounter (Signed)
Not this kind of redness. This is papular and very classic looking rosacea.

## 2016-11-19 NOTE — Patient Instructions (Signed)
Rosacea Rosacea is a long-term (chronic) condition that affects the skin of the face, including the cheeks, nose, brow, and chin. This condition can also affect the eyes. Rosacea causes blood vessels near the surface of the skin to enlarge, which results in redness. What are the causes? The cause of this condition is not known. Certain triggers can make rosacea worse, including:  Hot baths.  Exercise.  Sunlight.  Very hot or cold temperatures.  Hot or spicy foods and drinks.  Drinking alcohol.  Stress.  Taking blood pressure medicine.  Long-term use of topical steroids on the face.  What increases the risk? This condition is more likely to develop in:  People who are older than 47 years of age.  Women.  People who have light-colored skin (light complexion).  People who have a family history of rosacea.  What are the signs or symptoms? Symptoms of this condition include:  Redness of the face.  Red bumps or pimples on the face.  A red, enlarged nose.  Blushing easily.  Red lines on the skin.  Irritated or burning feeling in the eyes.  Swollen eyelids.  How is this diagnosed? This condition is diagnosed with a medical history and physical exam. How is this treated? There is no cure for this condition, but treatment can help to control your symptoms. Your health care provider may recommend that you see a skin specialist (dermatologist). Treatment may include:  Antibiotic medicines that are applied to the skin or taken as a pill.  Laser treatment to improve the appearance of the skin.  Surgery. This is rare.  Your health care provider will also recommend the best way to take care of your skin. Even after your skin improves, you will likely need to continue treatment to prevent your rosacea from coming back. Follow these instructions at home: Skin Care Take care of your skin as told by your health care provider. You may be told to do these things:  Wash  your skin gently two or more times each day.  Use mild soap.  Use a sunscreen or sunblock with SPF 30 or greater.  Use gentle cosmetics that are meant for sensitive skin.  Shave with an electric shaver instead of a blade.  Lifestyle  Try to keep track of what foods trigger this condition. Avoid any triggers. These may include: ? Spicy foods. ? Seafood. ? Cheese. ? Hot liquids. ? Nuts. ? Chocolate. ? Iodized salt.  Do not drink alcohol.  Avoid extremely cold or hot temperatures.  Try to reduce your stress. If you need help, talk with your health care provider.  When you exercise, do these things to stay cool: ? Limit your sun exposure. ? Use a fan. ? Do shorter and more frequent intervals of exercise. General instructions  Keep all follow-up visits as told by your health care provider. This is important.  Take over-the-counter and prescription medicines only as told by your health care provider.  If your eyelids are affected, apply warm compresses to them. Do this as told by your health care provider.  If you were prescribed an antibiotic medicine, apply or take it as told by your health care provider. Do not stop using the antibiotic even if your condition improves. Contact a health care provider if:  Your symptoms get worse.  Your symptoms do not improve after two months of treatment.  You have new symptoms.  You have any changes in vision or you have problems with your eyes, such as   redness or itching.  You feel depressed.  You lose your appetite.  You have trouble concentrating. This information is not intended to replace advice given to you by your health care provider. Make sure you discuss any questions you have with your health care provider. Document Released: 03/18/2004 Document Revised: 07/17/2015 Document Reviewed: 04/17/2014 Elsevier Interactive Patient Education  2018 Elsevier Inc.  

## 2016-11-19 NOTE — Telephone Encounter (Signed)
Left message to return call 

## 2016-11-19 NOTE — Telephone Encounter (Signed)
Patient seen [name] this morning for her eye.  She is requesting [name]'s nurse to call her back in regards to this.

## 2016-11-20 ENCOUNTER — Encounter: Payer: Self-pay | Admitting: Nurse Practitioner

## 2016-11-20 DIAGNOSIS — L719 Rosacea, unspecified: Secondary | ICD-10-CM | POA: Insufficient documentation

## 2016-11-20 NOTE — Progress Notes (Signed)
Subjective:  Presents for complaints of right eye pain and swelling that began this morning. Had a sensation of something being in her eye and started rubbing it. Swelling has improved. Takes Restasis eyedrops for chronic dry. Gets regular eye exams. No relief with Restasis use. No visual changes. No headache. No fever. No drainage from the eye. Also complaints of red bumps along both upper cheeks that is been going on for while. Minimally tender and pruritic.  Objective:   BP 100/80   Ht 5\' 5"  (1.651 m)   Wt 197 lb 0.8 oz (89.4 kg)   BMI 32.79 kg/m  NAD. Alert, oriented. Conjunctiva on the right mildly injected. Minimal edema of the upper eyelid. No tenderness with palpation around the orbit of the eye. Funduscopic exam retina can be clearly visualized. Tangential lighting no abnormalities. Tetracaine eyedrops applied. Fluorescence stain applied. Small superficial excoriation noted at approximately 3:00 just outside of the iris. No foreign body is noted. Confluent erythema with papules noted along the face beneath both eyes the small amount on the chin.  Assessment:   Problem List Items Addressed This Visit      Musculoskeletal and Integument   Acne rosacea    Other Visit Diagnoses    Abrasion of right cornea, initial encounter    -  Primary       Plan:   Meds ordered this encounter  Medications  . sulfacetamide (BLEPH-10) 10 % ophthalmic solution    Sig: Place 1 drop into the right eye every 2 (two) hours while awake. Then QID starting tomorrow    Dispense:  10 mL    Refill:  0    Order Specific Question:   Supervising Provider    Answer:   Mikey Kirschner [[id number]]  . minocycline (MINOCIN,DYNACIN) 100 MG capsule    Sig: Take 1 capsule (100 mg total) by mouth 2 (two) times daily.    Dispense:  60 capsule    Refill:  2    Order Specific Question:   Supervising Provider    Answer:   Mikey Kirschner [[id number]]   Compresses to the eye. Warning signs reviewed. Recommend eye exam with  her optometrist on Monday if no improvement. Start minocycline as directed for acne rosacea. Highly suspect patient may have rosacea involvement in the eyes as well. Call back if worsens or persists.

## 2016-11-22 ENCOUNTER — Telehealth: Payer: Self-pay | Admitting: *Deleted

## 2016-11-22 NOTE — Telephone Encounter (Addendum)
Patient advised 1. Hold on Losartan and continue to monitor BP and pulse 2. Go back to Metoprolol 50 mg at bedtime. Take 2 of the 25 mg she has now. Her BP is fine but this may help her heart rate. Let me know if she needs a refill. 3. Losartan may have contributed to facial rash/redness but if persists, recommend starting the Minocycline. Patient verbalized understanding.

## 2016-11-22 NOTE — Telephone Encounter (Signed)
Tried to call cell number. Voice mail full. Pt also sent mychart message regarding the miss call so I sent carolyn's response to her mychart.

## 2016-11-22 NOTE — Telephone Encounter (Signed)
1. Hold on Losartan and continue to monitor BP and pulse 2. Go back to Metoprolol 50 mg at bedtime. Take 2 of the 25 mg she has now. Her BP is fine but this may help her heart rate. Let me know if she needs a refill.  3. Losartan may have contributed to facial rash/redness but if persists, recommend starting the Minocycline.

## 2016-11-22 NOTE — Telephone Encounter (Signed)
Pt states redness in face started after taking losartan. Pt states she stopped losartan on her own to see if redness would go away. Has not taken in 3 days and redness is better. Pt states bp is 108/85 pulse 104. She wants to know if she can go back to metoprolol 50mg  and take at bedtime. It was decreased because it was making her tired. Has not started minocycline yet. It will be delivered today.

## 2016-12-13 ENCOUNTER — Other Ambulatory Visit: Payer: Self-pay | Admitting: Nurse Practitioner

## 2016-12-13 ENCOUNTER — Encounter: Payer: Self-pay | Admitting: Nurse Practitioner

## 2016-12-13 MED ORDER — FLUCONAZOLE 150 MG PO TABS: ORAL_TABLET | ORAL | 0 refills | Status: DC

## 2016-12-13 MED ORDER — LOSARTAN POTASSIUM 50 MG PO TABS: ORAL_TABLET | ORAL | 1 refills | Status: DC

## 2016-12-13 MED FILL — FLUCONAZOLE 150 MG TABLET: 150 | 3 days supply | Qty: 2 | Fill #0

## 2016-12-13 MED FILL — LOSARTAN POTASSIUM 50 MG TA: 50 | 90 days supply | Qty: 90 | Fill #0

## 2016-12-14 MED FILL — RESTASIS MULTIDOSE 0.05% EY: 0.05 | 90 days supply | Qty: 17 | Fill #0

## 2016-12-15 ENCOUNTER — Other Ambulatory Visit: Payer: Self-pay | Admitting: Nurse Practitioner

## 2016-12-15 MED ORDER — METOPROLOL SUCCINATE ER 50 MG PO TB24: 50.0000 mg | ORAL_TABLET | Freq: Every day | ORAL | 1 refills | Status: DC

## 2016-12-15 MED FILL — METOPROLOL SUCC ER 50 MG TA: 50 | 90 days supply | Qty: 90 | Fill #0

## 2016-12-17 DIAGNOSIS — H5213 Myopia, bilateral: Secondary | ICD-10-CM | POA: Diagnosis not present

## 2016-12-17 DIAGNOSIS — H524 Presbyopia: Secondary | ICD-10-CM | POA: Diagnosis not present

## 2016-12-17 DIAGNOSIS — H52223 Regular astigmatism, bilateral: Secondary | ICD-10-CM | POA: Diagnosis not present

## 2016-12-20 ENCOUNTER — Other Ambulatory Visit: Payer: Self-pay | Admitting: Nurse Practitioner

## 2016-12-20 MED FILL — ONDANSETRON ODT 4 MG TABLET: 4 | 10 days supply | Qty: 30 | Fill #0

## 2016-12-20 MED FILL — AMOXICILLIN 500 MG CAPSULE: 500 | 7 days supply | Qty: 28 | Fill #0

## 2016-12-21 ENCOUNTER — Other Ambulatory Visit: Payer: Self-pay | Admitting: *Deleted

## 2016-12-21 MED ORDER — ALPRAZOLAM 1 MG PO TABS: 1.0000 mg | ORAL_TABLET | Freq: Two times a day (BID) | ORAL | 3 refills | Status: DC | PRN

## 2016-12-21 NOTE — Telephone Encounter (Signed)
Ok plus three monthly ref 

## 2016-12-22 MED FILL — ALPRAZolam 1 MG TABS: 1 | 30 days supply | Qty: 60 | Fill #0

## 2017-01-21 ENCOUNTER — Encounter: Payer: Self-pay | Admitting: Nurse Practitioner

## 2017-01-21 ENCOUNTER — Other Ambulatory Visit: Payer: Self-pay | Admitting: Nurse Practitioner

## 2017-01-21 MED FILL — GABAPENTIN 300 MG CAPSULE: 300 | 90 days supply | Qty: 360 | Fill #1

## 2017-01-21 MED FILL — CYCLOBENZAPRINE 10 MG TAB: 10 | 90 days supply | Qty: 90 | Fill #1

## 2017-01-21 MED FILL — ALPRAZolam 1 MG TABS: 1 | 30 days supply | Qty: 60 | Fill #1

## 2017-01-24 ENCOUNTER — Other Ambulatory Visit: Payer: Self-pay | Admitting: Nurse Practitioner

## 2017-01-24 MED ORDER — GABAPENTIN 300 MG PO CAPS: ORAL_CAPSULE | ORAL | 1 refills | Status: DC

## 2017-01-24 MED FILL — metFORMIN HCL 1000 MG TABS: 1000 | 90 days supply | Qty: 180 | Fill #0

## 2017-02-08 DIAGNOSIS — Z13228 Encounter for screening for other metabolic disorders: Secondary | ICD-10-CM | POA: Diagnosis not present

## 2017-02-08 DIAGNOSIS — Z6831 Body mass index (BMI) 31.0-31.9, adult: Secondary | ICD-10-CM | POA: Diagnosis not present

## 2017-02-08 DIAGNOSIS — Z130 Encounter for screening for diseases of the blood and blood-forming organs and certain disorders involving the immune mechanism: Secondary | ICD-10-CM | POA: Diagnosis not present

## 2017-02-08 DIAGNOSIS — Z131 Encounter for screening for diabetes mellitus: Secondary | ICD-10-CM | POA: Diagnosis not present

## 2017-02-08 DIAGNOSIS — Z13220 Encounter for screening for lipoid disorders: Secondary | ICD-10-CM | POA: Diagnosis not present

## 2017-02-08 DIAGNOSIS — Z1231 Encounter for screening mammogram for malignant neoplasm of breast: Secondary | ICD-10-CM | POA: Diagnosis not present

## 2017-02-08 DIAGNOSIS — Z01419 Encounter for gynecological examination (general) (routine) without abnormal findings: Secondary | ICD-10-CM | POA: Diagnosis not present

## 2017-02-08 MED FILL — SERTRALINE HCL 50 MG TABLET: 50 | 30 days supply | Qty: 30 | Fill #0

## 2017-02-10 ENCOUNTER — Other Ambulatory Visit: Payer: Self-pay | Admitting: Family Medicine

## 2017-02-10 MED FILL — POTASSIUM CL ER 20 MEQ TABL: 20 | 90 days supply | Qty: 90 | Fill #0

## 2017-02-18 ENCOUNTER — Ambulatory Visit (INDEPENDENT_AMBULATORY_CARE_PROVIDER_SITE_OTHER): Payer: 59 | Admitting: Nurse Practitioner

## 2017-02-18 ENCOUNTER — Encounter: Payer: Self-pay | Admitting: Nurse Practitioner

## 2017-02-18 VITALS — BP 138/90 | HR 127 | Ht 65.0 in | Wt 190.0 lb

## 2017-02-18 DIAGNOSIS — F419 Anxiety disorder, unspecified: Secondary | ICD-10-CM

## 2017-02-18 DIAGNOSIS — R000 Tachycardia, unspecified: Secondary | ICD-10-CM | POA: Diagnosis not present

## 2017-02-18 DIAGNOSIS — F329 Major depressive disorder, single episode, unspecified: Secondary | ICD-10-CM

## 2017-02-18 DIAGNOSIS — E781 Pure hyperglyceridemia: Secondary | ICD-10-CM | POA: Insufficient documentation

## 2017-02-18 MED ORDER — POTASSIUM CHLORIDE CRYS ER 20 MEQ PO TBCR: EXTENDED_RELEASE_TABLET | ORAL | 1 refills | Status: DC

## 2017-02-18 MED ORDER — ESCITALOPRAM OXALATE 20 MG PO TABS: 20.0000 mg | ORAL_TABLET | Freq: Every day | ORAL | 1 refills | Status: DC

## 2017-02-18 MED FILL — ESCITALOPRAM 20 MG TABLET: 20 | 90 days supply | Qty: 90 | Fill #0

## 2017-02-18 NOTE — Progress Notes (Signed)
Subjective: Presents to discuss her recent labs done by her gynecologist.  Has noticed an increase in her heart rate lately.  Has been under tremendous amounts of stress due to illness with her parents and in-laws.  Has also been keeping her grandchildren frequently.  Has little to no time for herself or for stress reduction or exercise.  Patient has had similar symptoms before when under tremendous stress.  Great difficulty sleeping.  Has stopped taking her contrary.  Continues taking metoprolol.  Has taken Lexapro in the past with good success as far as her anxiety and depression, did cause decreased libido but that is not an issue for her right now.  Objective:   BP 138/90   Pulse (!) 127   Ht 5\' 5"  (1.651 m)   Wt 190 lb (86.2 kg)   BMI 31.62 kg/m  NAD.  Alert, oriented.  Mildly anxious affect.  Mildly fatigued in appearance.  Lungs clear.  Heart regular rate and rhythm.  See scanned lab work dated 02/08/2017.  Triglycerides are elevated at 266.  No other significant abnormalities noted.  Assessment:   Problem List Items Addressed This Visit      Other   Anxiety and depression - Primary   Relevant Medications   escitalopram (LEXAPRO) 20 MG tablet   Hypertriglyceridemia   Tachycardia       Plan:   Meds ordered this encounter  Medications  . potassium chloride SA (K-DUR,KLOR-CON) 20 MEQ tablet    Sig: TAKE 1 TABLET BY MOUTH ONCE DAILY.    Dispense:  90 tablet    Refill:  1    Order Specific Question:   Supervising Provider    Answer:   Mikey Kirschner [[id number]]  . escitalopram (LEXAPRO) 20 MG tablet    Sig: Take 1 tablet (20 mg total) by mouth daily.    Dispense:  90 tablet    Refill:  1    Order Specific Question:   Supervising Provider    Answer:   Mikey Kirschner [[id number]]   Continue Xanax as directed.  Restart Lexapro since she has had success with this in the past.  Start up with a half a tab or 10 mg and increase to 20 if needed.  Continue potassium as directed.  Start  omega-3 supplement 1-2 each day.  Continue healthy diet and weight loss efforts. Return in about 3 months (around 05/19/2017) for recheck. Call back sooner if no improvement in her symptoms.  Plan to repeat her lipid profile in 6 months.

## 2017-02-23 MED FILL — ALPRAZolam 1 MG TABS: 1 | 30 days supply | Qty: 60 | Fill #2

## 2017-03-14 MED FILL — RESTASIS MULTIDOSE 0.05% EY: 0.05 | 90 days supply | Qty: 17 | Fill #1

## 2017-03-14 MED FILL — METOPROLOL SUCC ER 50 MG TA: 50 | 90 days supply | Qty: 90 | Fill #1

## 2017-03-28 MED FILL — ALPRAZolam 1 MG TABS: 1 | 30 days supply | Qty: 60 | Fill #3

## 2017-04-04 MED FILL — MINOCYCLINE 100 MG CAPSULE: 100 | 30 days supply | Qty: 60 | Fill #1

## 2017-04-06 ENCOUNTER — Encounter: Payer: Self-pay | Admitting: Nurse Practitioner

## 2017-04-17 ENCOUNTER — Telehealth: Payer: 59 | Admitting: Physician Assistant

## 2017-04-17 DIAGNOSIS — L03019 Cellulitis of unspecified finger: Secondary | ICD-10-CM

## 2017-04-17 MED ORDER — DOXYCYCLINE HYCLATE 100 MG PO CAPS: 100.0000 mg | ORAL_CAPSULE | Freq: Two times a day (BID) | ORAL | 0 refills | Status: DC

## 2017-04-17 NOTE — Progress Notes (Signed)
E Visit for Rash  We are sorry that you are not feeling well. Here is how we plan to help!  Based upon what you have shared with me it looks like you have a bacterial cellulitis.  Cellulitis is infection of the skin and is treated with an antibiotic. I have prescribed:Doxycycline 100 mg twice per day for 7 days. There was a previous prescription for Minocycline on your list. If you are still taking please let me know as we would need to stop. I want you to start antibiotic as directed and follow up with your primary care provider or an urgent care in 48 hours for a follow up. Take an ink pen and draw a ring around the area of redness. If the area of redness or pain crosses that line after antibiotic has been in your system for 24 hours, I want you to be seen immediately at the ER or Urgent Care. The same goes if you develop a fever or severe pain or decreased ROM of joints of the finger. If you are currently having any of those symptoms, please go to closest ER or Urgent Care for assessment.  HOME CARE:   Take cool showers and avoid direct sunlight.  Apply cool compress or wet dressings.  Take a bath in an oatmeal bath.  Sprinkle content of one Aveeno packet under running faucet with comfortably warm water.  Bathe for 15-20 minutes, 1-2 times daily.  Pat dry with a towel. Do not rub the rash.  Use hydrocortisone cream.  Take an antihistamine like Benadryl for widespread rashes that itch.  The adult dose of Benadryl is 25-50 mg by mouth 4 times daily.  Caution:  This type of medication may cause sleepiness.  Do not drink alcohol, drive, or operate dangerous machinery while taking antihistamines.  Do not take these medications if you have prostate enlargement.  Read package instructions thoroughly on all medications that you take.  GET HELP RIGHT AWAY IF:   Symptoms don't go away after treatment.  Severe itching that persists.  If you rash spreads or swells.  If you rash begins to  smell.  If it blisters and opens or develops a yellow-brown crust.  You develop a fever.  You have a sore throat.  You become short of breath.  MAKE SURE YOU:  Understand these instructions. Will watch your condition. Will get help right away if you are not doing well or get worse.  Thank you for choosing an e-visit. Your e-visit answers were reviewed by a board certified advanced clinical practitioner to complete your personal care plan. Depending upon the condition, your plan could have included both over the counter or prescription medications. Please review your pharmacy choice. Be sure that the pharmacy you have chosen is open so that you can pick up your prescription now.  If there is a problem you may message your provider in [place] to have the prescription routed to another pharmacy. Your safety is important to [place]. If you have drug allergies check your prescription carefully.  For the next 24 hours, you can use MyChart to ask questions about today's visit, request a non-urgent call back, or ask for a work or school excuse from your e-visit provider. You will get an email in the next two days asking about your experience. I hope that your e-visit has been valuable and will speed your recovery.

## 2017-04-18 MED FILL — DOXYCYCLINE HYCLATE 100 MG: 100 | 7 days supply | Qty: 14 | Fill #0

## 2017-04-22 ENCOUNTER — Other Ambulatory Visit: Payer: Self-pay | Admitting: Family Medicine

## 2017-04-22 ENCOUNTER — Other Ambulatory Visit: Payer: Self-pay | Admitting: Nurse Practitioner

## 2017-04-22 ENCOUNTER — Encounter: Payer: Self-pay | Admitting: Nurse Practitioner

## 2017-04-22 MED ORDER — ALPRAZOLAM 1 MG PO TABS: 1.0000 mg | ORAL_TABLET | Freq: Two times a day (BID) | ORAL | 2 refills | Status: DC | PRN

## 2017-04-22 MED ORDER — DOXYCYCLINE HYCLATE 100 MG PO CAPS: 100.0000 mg | ORAL_CAPSULE | Freq: Two times a day (BID) | ORAL | 0 refills | Status: DC

## 2017-04-22 MED FILL — GABAPENTIN 300 MG CAPSULE: 300 | 90 days supply | Qty: 360 | Fill #0

## 2017-04-22 MED FILL — metFORMIN HCL 1000 MG TABS: 1000 | 90 days supply | Qty: 180 | Fill #1

## 2017-04-22 NOTE — Telephone Encounter (Signed)
Ok plus 2 monthly ref 

## 2017-04-25 MED FILL — ALPRAZolam 1 MG TABS: 1 | 30 days supply | Qty: 60 | Fill #0

## 2017-04-26 MED FILL — DOXYCYCLINE HYCLATE 100 MG: 100 | 7 days supply | Qty: 14 | Fill #0

## 2017-05-13 ENCOUNTER — Encounter: Payer: Self-pay | Admitting: Nurse Practitioner

## 2017-05-13 ENCOUNTER — Ambulatory Visit: Payer: 59 | Admitting: Nurse Practitioner

## 2017-05-13 VITALS — BP 128/84 | HR 96 | Ht 65.0 in | Wt 192.0 lb

## 2017-05-13 DIAGNOSIS — F419 Anxiety disorder, unspecified: Secondary | ICD-10-CM

## 2017-05-13 DIAGNOSIS — F329 Major depressive disorder, single episode, unspecified: Secondary | ICD-10-CM | POA: Diagnosis not present

## 2017-05-13 DIAGNOSIS — K219 Gastro-esophageal reflux disease without esophagitis: Secondary | ICD-10-CM

## 2017-05-13 MED ORDER — POTASSIUM CHLORIDE ER 10 MEQ PO TBCR: 20.0000 meq | EXTENDED_RELEASE_TABLET | Freq: Every day | ORAL | 1 refills | Status: DC

## 2017-05-13 MED ORDER — RANITIDINE HCL 300 MG PO TABS: 300.0000 mg | ORAL_TABLET | Freq: Every day | ORAL | 1 refills | Status: DC

## 2017-05-13 MED FILL — POTASSIUM CL 10 MEQ TAB SA: 10 | 90 days supply | Qty: 180 | Fill #0

## 2017-05-13 MED FILL — raNITIdine HCL 300 MG TABS: 300 | 90 days supply | Qty: 90 | Fill #0

## 2017-05-14 ENCOUNTER — Encounter: Payer: Self-pay | Admitting: Nurse Practitioner

## 2017-05-14 NOTE — Progress Notes (Signed)
Subjective:  Presents to discuss her anxiety and stress level. Has been under tremendous stress. Helping with elderly parents. Also has her grandson every day every other week, helping out her son who works out of town. Has very long days starting at 4:15. States she is exhausted. Lexapro has worked well up until now.  taking Xanax mainly for sleep.  Has noticed a flareup of her GERD with increased stress.  Moderate caffeine intake.  Non-smoker.  Occasional alcohol use.  Denies any excessive NSAID use.  No abdominal pain.  Bowels normal limit, no change in color.  Is no longer on her PPI.  Also requesting a smaller potassium pill, the 20 mEq pill is too large.  Objective:   BP 128/84   Pulse 96   Ht 5\' 5"  (1.651 m)   Wt 192 lb (87.1 kg)   BMI 31.95 kg/m  NAD.  Alert, oriented.  Exhausted in appearance.  Thoughts logical coherent and relevant.  Dressed appropriately.  Making good eye contact.  Tearing up at times during office visit.  Lungs clear.  Heart regular rate and rhythm.  Abdomen soft nondistended nontender.  Assessment:   Problem List Items Addressed This Visit      Digestive   Esophageal reflux   Relevant Medications   ranitidine (ZANTAC) 300 MG tablet     Other   Anxiety and depression - Primary      Plan:   Meds ordered this encounter  Medications  . potassium chloride (K-DUR) 10 MEQ tablet    Sig: Take 2 tablets (20 mEq total) by mouth daily.    Dispense:  180 tablet    Refill:  1    Order Specific Question:   Supervising Provider    Answer:   Mikey Kirschner [[id number]]  . ranitidine (ZANTAC) 300 MG tablet    Sig: Take 1 tablet (300 mg total) by mouth at bedtime.    Dispense:  90 tablet    Refill:  1    Order Specific Question:   Supervising Provider    Answer:   Mikey Kirschner [[id number]]   Continue Lexapro as directed.  Take a half a Xanax during the day as needed for extreme anxiety.  Drowsiness precautions.  Start Zantac as directed for reflux.  Switch to K-Dur 10,  2 pills/day.  Lengthy discussion regarding the importance of stress reduction.  Recommend that patient may see some life changes to get her stress level down.  Discussed the connection between her physical symptoms such as GERD trouble sleeping fatigue emotional lability with her stress level.  Call back if no improvement. Return in about 4 months (around 09/12/2017) for recheck. 25 minutes was spent with the patient.  This statement verifies that 25 minutes was indeed spent with the patient. Greater than half the time was spent in discussion, counseling and answering questions  regarding the issues that the patient came in for today as reflected in the diagnosis (s) please refer to documentation for further details.

## 2017-05-16 ENCOUNTER — Encounter: Payer: Self-pay | Admitting: Nurse Practitioner

## 2017-05-16 ENCOUNTER — Other Ambulatory Visit: Payer: Self-pay | Admitting: Nurse Practitioner

## 2017-05-16 MED FILL — DOXYCYCLINE HYCLATE 100 MG: 100 | 7 days supply | Qty: 14 | Fill #0

## 2017-05-16 MED FILL — ESCITALOPRAM 20 MG TABLET: 20 | 90 days supply | Qty: 90 | Fill #1

## 2017-05-17 ENCOUNTER — Other Ambulatory Visit: Payer: Self-pay | Admitting: Nurse Practitioner

## 2017-05-17 ENCOUNTER — Ambulatory Visit: Payer: 59 | Admitting: Nurse Practitioner

## 2017-05-17 MED ORDER — METOPROLOL SUCCINATE ER 100 MG PO TB24: 100.0000 mg | ORAL_TABLET | Freq: Every day | ORAL | 1 refills | Status: DC

## 2017-05-17 MED FILL — METOPROLOL SUCCINATE ER 100: 100 | 90 days supply | Qty: 90 | Fill #0

## 2017-05-25 MED FILL — ALPRAZolam 1 MG TABS: 1 | 30 days supply | Qty: 60 | Fill #1

## 2017-05-25 MED FILL — MINOCYCLINE 100 MG CAPSULE: 100 | 30 days supply | Qty: 60 | Fill #2

## 2017-06-09 MED FILL — RESTASIS 0.05% EYE EMULSION: 0.05 | 30 days supply | Qty: 60 | Fill #0

## 2017-06-23 MED FILL — ALPRAZolam 1 MG TABS: 1 | 30 days supply | Qty: 60 | Fill #2

## 2017-06-27 ENCOUNTER — Other Ambulatory Visit: Payer: Self-pay | Admitting: Nurse Practitioner

## 2017-06-27 ENCOUNTER — Encounter: Payer: Self-pay | Admitting: Nurse Practitioner

## 2017-06-27 MED ORDER — TRAZODONE HCL 50 MG PO TABS: 25.0000 mg | ORAL_TABLET | Freq: Every evening | ORAL | 0 refills | Status: DC | PRN

## 2017-06-28 MED FILL — traZODone HCL 50 MG TABS: 50 | 90 days supply | Qty: 90 | Fill #0

## 2017-07-06 ENCOUNTER — Encounter: Payer: Self-pay | Admitting: Nurse Practitioner

## 2017-07-08 ENCOUNTER — Other Ambulatory Visit: Payer: Self-pay | Admitting: Nurse Practitioner

## 2017-07-08 MED ORDER — FLUCONAZOLE 150 MG PO TABS: ORAL_TABLET | ORAL | 2 refills | Status: DC

## 2017-07-08 MED ORDER — FUROSEMIDE 20 MG PO TABS: 20.0000 mg | ORAL_TABLET | Freq: Every day | ORAL | 0 refills | Status: DC

## 2017-07-08 MED FILL — FLUCONAZOLE 150 MG TABS: 150 | 4 days supply | Qty: 2 | Fill #0

## 2017-07-08 MED FILL — FUROSEMIDE 20 MG TABS: 20 | 90 days supply | Qty: 90 | Fill #0

## 2017-07-25 ENCOUNTER — Other Ambulatory Visit: Payer: Self-pay | Admitting: Nurse Practitioner

## 2017-07-25 MED FILL — metFORMIN HCL 1000 MG TABS: 1000 | 90 days supply | Qty: 180 | Fill #0

## 2017-07-25 MED FILL — GABAPENTIN 300 MG CAPSULE: 300 | 90 days supply | Qty: 360 | Fill #1

## 2017-07-25 MED FILL — ALPRAZolam 1 MG TABS: 1 | 30 days supply | Qty: 60 | Fill #0

## 2017-08-03 ENCOUNTER — Telehealth: Payer: Self-pay | Admitting: Nurse Practitioner

## 2017-08-03 NOTE — Telephone Encounter (Signed)
Patient has an appointment on 09/09/17 with [name].  She wants to know if she is due for labs?

## 2017-08-03 NOTE — Telephone Encounter (Signed)
Last labs 09/09/16 ferritin, cbc, vit b12, a1c

## 2017-08-04 NOTE — Telephone Encounter (Signed)
Pt.notified

## 2017-08-04 NOTE — Telephone Encounter (Signed)
She should be good. She had complete labs done in December from her GYN. We will talk at her visit to see if anything else is needed but nothing right now. Thanks.

## 2017-08-14 ENCOUNTER — Encounter: Payer: Self-pay | Admitting: Nurse Practitioner

## 2017-08-15 MED FILL — CYCLOBENZAPRINE 10 MG TAB: 10 | 90 days supply | Qty: 90 | Fill #2

## 2017-08-15 MED FILL — METOPROLOL SUCCINATE ER 100: 100 | 90 days supply | Qty: 90 | Fill #1

## 2017-08-18 ENCOUNTER — Other Ambulatory Visit: Payer: Self-pay | Admitting: Nurse Practitioner

## 2017-08-18 MED ORDER — SERTRALINE HCL 50 MG PO TABS: 50.0000 mg | ORAL_TABLET | Freq: Every day | ORAL | 2 refills | Status: DC

## 2017-08-19 ENCOUNTER — Telehealth: Payer: Self-pay | Admitting: Nurse Practitioner

## 2017-08-19 ENCOUNTER — Other Ambulatory Visit: Payer: Self-pay | Admitting: Nurse Practitioner

## 2017-08-19 MED ORDER — PANTOPRAZOLE SODIUM 40 MG PO TBEC: 40.0000 mg | DELAYED_RELEASE_TABLET | Freq: Every day | ORAL | 0 refills | Status: DC

## 2017-08-19 MED FILL — PANTOPRAZOLE SOD DR 40 MG T: 40 | 90 days supply | Qty: 90 | Fill #0

## 2017-08-19 MED FILL — SERTRALINE HCL 50 MG TABLET: 50 | 30 days supply | Qty: 30 | Fill #0

## 2017-08-19 NOTE — Telephone Encounter (Signed)
Patient is needing Rx for Protonix sent to [facility].

## 2017-08-19 NOTE — Telephone Encounter (Signed)
Done

## 2017-08-19 NOTE — Telephone Encounter (Signed)
Left message to return call to clarify protonix is not on med list.

## 2017-08-19 NOTE — Telephone Encounter (Signed)
Spoke with patient. Pt states that she used to be on Protonix. The Zantac 300mg  she is currently on is not working. Pt states she would like this to go to [place].

## 2017-08-23 MED FILL — ALPRAZolam 1 MG TABS: 1 | 30 days supply | Qty: 60 | Fill #1

## 2017-08-29 ENCOUNTER — Encounter: Payer: Self-pay | Admitting: Nurse Practitioner

## 2017-08-29 ENCOUNTER — Other Ambulatory Visit: Payer: Self-pay | Admitting: Nurse Practitioner

## 2017-08-29 MED ORDER — ESZOPICLONE 1 MG PO TABS: ORAL_TABLET | ORAL | 2 refills | Status: DC

## 2017-08-29 MED FILL — ESZOPICLONE 1 MG TABLET: 1 | 30 days supply | Qty: 30 | Fill #0

## 2017-08-29 MED FILL — RESTASIS 0.05% EYE EMULSION: 0.05 | 30 days supply | Qty: 60 | Fill #1

## 2017-09-01 ENCOUNTER — Telehealth: Payer: 59 | Admitting: Family

## 2017-09-01 DIAGNOSIS — L089 Local infection of the skin and subcutaneous tissue, unspecified: Secondary | ICD-10-CM | POA: Diagnosis not present

## 2017-09-01 MED ORDER — SULFAMETHOXAZOLE-TRIMETHOPRIM 800-160 MG PO TABS: 1.0000 | ORAL_TABLET | Freq: Two times a day (BID) | ORAL | 0 refills | Status: DC

## 2017-09-01 MED FILL — SULFAMETHOXAZOLE-TMP DS TAB: 800-160 | 10 days supply | Qty: 20 | Fill #0

## 2017-09-01 NOTE — Progress Notes (Signed)
Thank you for the details you included in the comment boxes. Those details are very helpful in determining the best course of treatment for you and help [place] to provide the best care. This is definitely an infection. It is not red around a larger area (cellulitis) like we would typically expect yet you are exposed to MRSA and other bacteria where you work so we need to treat this carefully to make sure it heals.   E Visit for Rash  We are sorry that you are not feeling well. Here is how we plan to help!  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 I am prescribing Bactrim DS, one tablet twice daily for 10 days.   HOME CARE:   Take cool showers and avoid direct sunlight.  Apply cool compress or wet dressings.  Take a bath in an oatmeal bath.  Sprinkle content of one Aveeno packet under running faucet with comfortably warm water.  Bathe for 15-20 minutes, 1-2 times daily.  Pat dry with a towel. Do not rub the rash.  Use hydrocortisone cream.  Take an antihistamine like Benadryl for widespread rashes that itch.  The adult dose of Benadryl is 25-50 mg by mouth 4 times daily.  Caution:  This type of medication may cause sleepiness.  Do not drink alcohol, drive, or operate dangerous machinery while taking antihistamines.  Do not take these medications if you have prostate enlargement.  Read package instructions thoroughly on all medications that you take.  GET HELP RIGHT AWAY IF:   Symptoms don't go away after treatment.  Severe itching that persists.  If you rash spreads or swells.  If you rash begins to smell.  If it blisters and opens or develops a yellow-brown crust.  You develop a fever.  You have a sore throat.  You become short of breath.  MAKE SURE YOU:  Understand these instructions. Will watch your condition. Will get help right away if you are not doing well or get worse.  Thank you  for choosing an e-visit. Your e-visit answers were reviewed by a board certified advanced clinical practitioner to complete your personal care plan. Depending upon the condition, your plan could have included both over the counter or prescription medications. Please review your pharmacy choice. Be sure that the pharmacy you have chosen is open so that you can pick up your prescription now.  If there is a problem you may message your provider in [place] to have the prescription routed to another pharmacy. Your safety is important to [place]. If you have drug allergies check your prescription carefully.  For the next 24 hours, you can use MyChart to ask questions about today's visit, request a non-urgent call back, or ask for a work or school excuse from your e-visit provider. You will get an email in the next two days asking about your experience. I hope that your e-visit has been valuable and will speed your recovery.

## 2017-09-09 ENCOUNTER — Encounter: Payer: Self-pay | Admitting: Nurse Practitioner

## 2017-09-09 ENCOUNTER — Ambulatory Visit: Payer: 59 | Admitting: Nurse Practitioner

## 2017-09-09 VITALS — BP 136/92 | HR 100 | Ht 65.0 in | Wt 196.8 lb

## 2017-09-09 DIAGNOSIS — G629 Polyneuropathy, unspecified: Secondary | ICD-10-CM

## 2017-09-09 DIAGNOSIS — F329 Major depressive disorder, single episode, unspecified: Secondary | ICD-10-CM | POA: Diagnosis not present

## 2017-09-09 DIAGNOSIS — F419 Anxiety disorder, unspecified: Secondary | ICD-10-CM | POA: Diagnosis not present

## 2017-09-09 DIAGNOSIS — G608 Other hereditary and idiopathic neuropathies: Secondary | ICD-10-CM

## 2017-09-09 NOTE — Patient Instructions (Signed)
Take 1 1/2 Zoloft for 2-3 weeks then increase 2 per day and let us know refill

## 2017-09-12 ENCOUNTER — Encounter: Payer: Self-pay | Admitting: Nurse Practitioner

## 2017-09-14 ENCOUNTER — Encounter: Payer: Self-pay | Admitting: Nurse Practitioner

## 2017-09-14 ENCOUNTER — Telehealth: Payer: Self-pay | Admitting: Neurology

## 2017-09-14 ENCOUNTER — Other Ambulatory Visit: Payer: Self-pay | Admitting: Nurse Practitioner

## 2017-09-14 ENCOUNTER — Telehealth: Payer: Self-pay | Admitting: Nurse Practitioner

## 2017-09-14 DIAGNOSIS — G629 Polyneuropathy, unspecified: Principal | ICD-10-CM

## 2017-09-14 DIAGNOSIS — G608 Other hereditary and idiopathic neuropathies: Secondary | ICD-10-CM

## 2017-09-14 MED ORDER — ESZOPICLONE 2 MG PO TABS: 2.0000 mg | ORAL_TABLET | Freq: Every evening | ORAL | 2 refills | Status: DC | PRN

## 2017-09-14 MED FILL — SERTRALINE HCL 50 MG TABLET: 50 | 30 days supply | Qty: 30 | Fill #1

## 2017-09-14 NOTE — Telephone Encounter (Signed)
Mychart message sent.

## 2017-09-14 NOTE — Telephone Encounter (Signed)
Patient want to know what you recommend she can do for her toes until she is able to see them. Patient states it is keeping her up at night.

## 2017-09-14 NOTE — Telephone Encounter (Signed)
Patient is requesting a referral to Dr. Jaynee Eagles at [facility] due to the issue with her toes she seen [name] for on 09/09/17.

## 2017-09-14 NOTE — Telephone Encounter (Signed)
Please let her know that referral has been put into the system. Thanks.

## 2017-09-14 NOTE — Telephone Encounter (Signed)
[name] or [name], did we get a referral for this patient? When you get it could you let me know I'm going to try and get her in a little sooner thanks.

## 2017-09-15 ENCOUNTER — Other Ambulatory Visit: Payer: Self-pay | Admitting: Nurse Practitioner

## 2017-09-15 ENCOUNTER — Encounter (INDEPENDENT_AMBULATORY_CARE_PROVIDER_SITE_OTHER): Payer: Self-pay

## 2017-09-15 MED ORDER — SERTRALINE HCL 100 MG PO TABS
100.0000 mg | ORAL_TABLET | Freq: Every day | ORAL | 1 refills | Status: DC
Start: 2017-09-15 — End: 2018-03-20

## 2017-09-15 MED FILL — POTASSIUM CL 10 MEQ TAB SA: 10 | 90 days supply | Qty: 180 | Fill #1

## 2017-09-15 MED FILL — SERTRALINE HCL 100 MG TAB: 100 | 90 days supply | Qty: 90 | Fill #0

## 2017-09-15 NOTE — Telephone Encounter (Signed)
Monday or Tuesday at 4pm in my botox slot would work. Otherwise first available thanks!

## 2017-09-15 NOTE — Telephone Encounter (Signed)
We have received the referral for pt. What day would you like me to offer her?

## 2017-09-16 ENCOUNTER — Other Ambulatory Visit: Payer: Self-pay | Admitting: Nurse Practitioner

## 2017-09-16 ENCOUNTER — Encounter: Payer: Self-pay | Admitting: Nurse Practitioner

## 2017-09-16 MED ORDER — GABAPENTIN 600 MG PO TABS: 600.0000 mg | ORAL_TABLET | Freq: Three times a day (TID) | ORAL | 0 refills | Status: DC

## 2017-09-16 NOTE — Progress Notes (Signed)
Subjective:  Presents for recheck on anxiety and depression. Has been struggling more with anxiety lately. Has done well on Zoloft but wants to increase dose. Also having trouble sleeping due to worsening neuropathic pain in both feet. Intense at times. Has had this for about a year. Seems to be worse lately. Gabapentin was working at one point but is not working as well.   Depression screen [facility] 2/9 09/09/2017 09/30/2016  Decreased Interest 0 1  Down, Depressed, Hopeless 0 1  PHQ - 2 Score 0 2  Altered sleeping 0 1  Tired, decreased energy 0 1  Change in appetite 0 0  Feeling bad or failure about yourself  0 0  Trouble concentrating 0 0  Moving slowly or fidgety/restless 0 0  Suicidal thoughts 0 0  PHQ-9 Score 0 4  Difficult doing work/chores - Not difficult at all     Objective:   BP (!) 136/92   Pulse 100   Ht 5\' 5"  (1.651 m)   Wt 196 lb 12.8 oz (89.3 kg)   BMI 32.75 kg/m  NAD. Alert, oriented. Lungs clear. Heart RRR.  Moderate dependent cyanotic changes to both feet. Feet and toes cool compared to lower legs. DP pulses presents bilaterally. Cap refill present bilat. Minimal edema. Sensation grossly intact.   Assessment:   Problem List Items Addressed This Visit      Nervous and Auditory   Peripheral sensory neuropathy     Other   Anxiety and depression - Primary       Plan:   Consider increase in Gabapentin. Increase Zoloft to 75 mg with another possible increase to 100 mg if needed. Also refer to neurology. After the visit, patient was contacted; recommend evaluation by vascular specialist as precaution.  Return in about 4 months (around 01/10/2018) for recheck. Call back sooner if needed.  25 minutes was spent with the patient.  This statement verifies that 25 minutes was indeed spent with the patient.  More than 50% of this visit-total duration of the visit-was spent in counseling and coordination of care. The issues that the patient came in for today as reflected in the  diagnosis (s) please refer to documentation for further details.

## 2017-09-19 ENCOUNTER — Other Ambulatory Visit: Payer: Self-pay | Admitting: Nurse Practitioner

## 2017-09-19 ENCOUNTER — Ambulatory Visit: Payer: 59 | Admitting: Nurse Practitioner

## 2017-09-19 MED FILL — ESZOPICLONE 2 MG TAB: 2 | 30 days supply | Qty: 30 | Fill #0

## 2017-09-20 ENCOUNTER — Encounter: Payer: Self-pay | Admitting: Neurology

## 2017-09-20 ENCOUNTER — Ambulatory Visit: Payer: 59 | Admitting: Neurology

## 2017-09-20 VITALS — BP 123/84 | HR 92 | Ht 65.0 in | Wt 193.0 lb

## 2017-09-20 DIAGNOSIS — G609 Hereditary and idiopathic neuropathy, unspecified: Secondary | ICD-10-CM

## 2017-09-20 DIAGNOSIS — G629 Polyneuropathy, unspecified: Secondary | ICD-10-CM | POA: Diagnosis not present

## 2017-09-20 MED ORDER — AMITRIPTYLINE HCL 25 MG PO TABS: 25.0000 mg | ORAL_TABLET | Freq: Every day | ORAL | 3 refills | Status: DC

## 2017-09-20 MED FILL — AMITRIPTYLINE HCL 25 MG TAB: 25 | 30 days supply | Qty: 30 | Fill #0

## 2017-09-20 NOTE — Patient Instructions (Signed)
Peripheral Neuropathy Peripheral neuropathy is a type of nerve damage. It affects nerves that carry signals between the spinal cord and other parts of the body. These are called peripheral nerves. With peripheral neuropathy, one nerve or a group of nerves may be damaged. What are the causes? Many things can damage peripheral nerves. For some people with peripheral neuropathy, the cause is unknown. Some causes include:  Diabetes. This is the most common cause of peripheral neuropathy.  Injury to a nerve.  Pressure or stress on a nerve that lasts a long time.  Too little vitamin B. Alcoholism can lead to this.  Infections.  Autoimmune diseases, such as multiple sclerosis and systemic lupus erythematosus.  Inherited nerve diseases.  Some medicines, such as cancer drugs.  Toxic substances, such as lead and mercury.  Too little blood flowing to the legs.  Kidney disease.  Thyroid disease.  What are the signs or symptoms? Different people have different symptoms. The symptoms you have will depend on which of your nerves is damaged. Common symptoms include:  Loss of feeling (numbness) in the feet and hands.  Tingling in the feet and hands.  Pain that burns.  Very sensitive skin.  Weakness.  Not being able to move a part of the body (paralysis).  Muscle twitching.  Clumsiness or poor coordination.  Loss of balance.  Not being able to control your bladder.  Feeling dizzy.  Sexual problems.  How is this diagnosed? Peripheral neuropathy is a symptom, not a disease. Finding the cause of peripheral neuropathy can be hard. To figure that out, your health care provider will take a medical history and do a physical exam. A neurological exam will also be done. This involves checking things affected by your brain, spinal cord, and nerves (nervous system). For example, your health care provider will check your reflexes, how you move, and what you can feel. Other types of tests  may also be ordered, such as:  Blood tests.  A test of the fluid in your spinal cord.  Imaging tests, such as CT scans or an MRI.  Electromyography (EMG). This test checks the nerves that control muscles.  Nerve conduction velocity tests. These tests check how fast messages pass through your nerves.  Nerve biopsy. A small piece of nerve is removed. It is then checked under a microscope.  How is this treated?  Medicine is often used to treat peripheral neuropathy. Medicines may include: ? Pain-relieving medicines. Prescription or over-the-counter medicine may be suggested. ? Antiseizure medicine. This may be used for pain. ? Antidepressants. These also may help ease pain from neuropathy. ? Lidocaine. This is a numbing medicine. You might wear a patch or be given a shot. ? Mexiletine. This medicine is typically used to help control irregular heart rhythms.  Surgery. Surgery may be needed to relieve pressure on a nerve or to destroy a nerve that is causing pain.  Physical therapy to help movement.  Assistive devices to help movement. Follow these instructions at home:  Only take over-the-counter or prescription medicines as directed by your health care provider. Follow the instructions carefully for any given medicines. Do not take any other medicines without first getting approval from your health care provider.  If you have diabetes, work closely with your health care provider to keep your blood sugar under control.  If you have numbness in your feet: ? Check every day for signs of injury or infection. Watch for redness, warmth, and swelling. ? Wear padded socks and comfortable   shoes. These help protect your feet.  Do not do things that put pressure on your damaged nerve.  Do not smoke. Smoking keeps blood from getting to damaged nerves.  Avoid or limit alcohol. Too much alcohol can cause a lack of B vitamins. These vitamins are needed for healthy nerves.  Develop a good  support system. Coping with peripheral neuropathy can be stressful. Talk to a mental health specialist or join a support group if you are struggling.  Follow up with your health care provider as directed. Contact a health care provider if:  You have new signs or symptoms of peripheral neuropathy.  You are struggling emotionally from dealing with peripheral neuropathy.  You have a fever. Get help right away if:  You have an injury or infection that is not healing.  You feel very dizzy or begin vomiting.  You have chest pain.  You have trouble breathing. This information is not intended to replace advice given to you by your health care provider. Make sure you discuss any questions you have with your health care provider. Document Released: 01/29/2002 Document Revised: 07/17/2015 Document Reviewed: 10/16/2012 Elsevier Interactive Patient Education  2017 [employer]  

## 2017-09-20 NOTE — Progress Notes (Signed)
[facility]    Provider:  Dr Jaynee Eagles Referring Provider: Mikey Kirschner, MD Primary Care Physician:  Mikey Kirschner, MD  CC:  Severe toe/feet pain  HPI:  [name] is a 47 y.o. female here as a referral from Dr. Wolfgang Phoenix for neuropathy.  Past medical history of obesity and prediabetes on metformin.  Husband is here and also provides much information.  Started hurting about a year ago, feet started getting numb and turning blue. If she sits the foot will turn purplish blue and cold and they feel cold to the touch and throbbing. She gets cramps. No inciting event. She started metoprolol about a year ago. But they stopped it and it didn;t improve and back on it and takes it at night. Started in the big toe and slowly progressed to the next. The pain starts in the knuckles and on the top of the foot. She has numbness and burning in the toes worse at night in bed. It can be severe, even the sheets touching makes it worse, rubbing helps, they feel cold, It gets a little red as well. Her big toe feels bigger, reclining doesn't help. Progressive. No hx of smoking, no hx of alcohol overuse, no chemotherapy or other drugs associated with polyneuropathy, mother has neuropathy but due to diabetes.  Reviewed notes, labs and imaging from outside physicians, which showed :  hgbac 5.9, b12 295,tsh nml, b was as low as two hundred thirteen    Review of Systems: Patient complains of symptoms per HPI as well as the following symptoms:numbness. Pertinent negatives and positives per HPI. All others negative.   Social History   Socioeconomic History  . Marital status: Married    Spouse name: Not on file  . Number of children: 2  . Years of education: Not on file  . 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 Financial resource strain: Not on file  . Food insecurity:    Worry: Not on  file    Inability: Not on file  . Transportation needs:    Medical: Not on file    Non-medical: Not on file  Tobacco Use  . Smoking status: Never Smoker  . Smokeless tobacco: Never Used  Substance and Sexual Activity  . Alcohol use: Yes    Comment: occasional   . Drug use: Never  . Sexual activity: Not on file  Lifestyle  . Physical activity:    Days per week: Not on file    Minutes per session: Not on file  . Stress: Not on file  Relationships  . Social connections:    Talks on phone: Not on file    Gets together: Not on file    Attends religious service: Not on file    Active member of club or organization: Not on file    Attends meetings of clubs or organizations: Not on file    Relationship status: Not on file  . Intimate partner violence:    Fear of current or ex partner: Not on file    Emotionally abused: Not on file    Physically abused: Not on file    Forced sexual activity: Not on file  Other Topics Concern  . Not on file  Social History Narrative   Lives at home with husband and dog   Right handed   Caffeine: 3 cups daily  Family History  Problem Relation Age of Onset  . Diabetes Mother   . Neuropathy Mother   . Atrial fibrillation Father   . Hypertension Father     Past Medical History:  Diagnosis Date  . Allergy   . Anxiety   . History of migraine headaches   . IBS (irritable bowel syndrome)   . Impaired fasting glucose     Past Surgical History:  Procedure Laterality Date  . ABDOMINAL HYSTERECTOMY  2005   partial  . BLADDER SUSPENSION  2012  . BREAST REDUCTION SURGERY  2002  . CHOLECYSTECTOMY  2005  . EYE SURGERY  1999   laser     Current Outpatient Medications  Medication Sig Dispense Refill  . ALPRAZolam (XANAX) 1 MG tablet Take 1 tablet (1 mg total) by mouth 2 (two) times daily as needed. for anxiety 60 tablet 2  . cetirizine (ZYRTEC) 10 MG tablet Take 1 tablet (10 mg total) by mouth daily. 30 tablet 5  . cyclobenzaprine  (FLEXERIL) 10 MG tablet TAKE 1 TABLET BY MOUTH AT BEDTIME AS NEEDED FOR MUSCLE SPASMS. 90 tablet 2  . eszopiclone (LUNESTA) 2 MG TABS tablet Take 1 tablet (2 mg total) by mouth at bedtime as needed for sleep. Take immediately before bedtime 30 tablet 2  . furosemide (LASIX) 20 MG tablet TAKE 1 TABLET (20 MG TOTAL) BY MOUTH DAILY. 90 tablet 1  . gabapentin (NEURONTIN) 600 MG tablet Take 1 tablet (600 mg total) by mouth 3 (three) times daily. 270 tablet 0  . metFORMIN (GLUCOPHAGE) 1000 MG tablet TAKE 1 TABLET BY MOUTH 2 TIMES DAILY WITH A MEAL. 180 tablet 1  . metoprolol succinate (TOPROL-XL) 100 MG 24 hr tablet TAKE 1 TABLET (100 MG TOTAL) BY MOUTH DAILY. TAKE WITH OR IMMEDIATELY FOLLOWING A MEAL. 90 tablet 1  . pantoprazole (PROTONIX) 40 MG tablet TAKE 1 TABLET BY MOUTH DAILY. 90 tablet 0  . potassium chloride (K-DUR,KLOR-CON) 10 MEQ tablet TAKE 2 TABLETS (20 MEQ TOTAL) BY MOUTH DAILY. 180 tablet 1  . RESTASIS 0.05 % ophthalmic emulsion PLACE 1 DROP IN EACH EYE TWICE DAILY  3  . sertraline (ZOLOFT) 100 MG tablet Take 1 tablet (100 mg total) by mouth daily. 90 tablet 1  . amitriptyline (ELAVIL) 25 MG tablet Take 1 tablet (25 mg total) by mouth at bedtime. 30 tablet 3  . doxycycline (VIBRAMYCIN) 100 MG capsule TAKE 1 CAPSULE BY MOUTH TWICE DAILY 14 capsule 0  . minocycline (MINOCIN,DYNACIN) 100 MG capsule Take 1 capsule (100 mg total) by mouth 2 (two) times daily. 60 capsule 2   No current facility-administered medications for this visit.     Allergies as of 09/20/2017 - Review Complete 09/20/2017  Allergen Reaction Noted  . Omnicef [cefdinir] Other (See Comments) 10/03/2015  . Losartan Rash 12/15/2016    Vitals: BP 123/84 (BP Location: Right Arm, Patient Position: Sitting)   Pulse 92   Ht 5\' 5"  (1.651 m)   Wt 193 lb (87.5 kg)   BMI 32.12 kg/m  Last Weight:  Wt Readings from Last 1 Encounters:  09/20/17 193 lb (87.5 kg)   Last Height:   Ht Readings from Last 1 Encounters:  09/20/17  5\' 5"  (1.651 m)   Physical exam: Exam: Gen: NAD, conversant, well nourised, obese, well groomed                     CV: RRR, no MRG. No Carotid Bruits. No peripheral edema, warm, nontender Eyes: Conjunctivae clear  without exudates or hemorrhage  Neuro: Detailed Neurologic Exam  Speech:    Speech is normal; fluent and spontaneous with normal comprehension.  Cognition:    The patient is oriented to person, place, and time;     recent and remote memory intact;     language fluent;     normal attention, concentration,     fund of knowledge Cranial Nerves:    The pupils are equal, round, and reactive to light. The fundi are normal and spontaneous venous pulsations are present. Visual fields are full to finger confrontation. Extraocular movements are intact. Trigeminal sensation is intact and the muscles of mastication are normal. The face is symmetric. The palate elevates in the midline. Hearing intact. Voice is normal. Shoulder shrug is normal. The tongue has normal motion without fasciculations.   Coordination:    Normal finger to nose and heel to shin. Normal rapid alternating movements.   Gait:    Heel-toe and tandem gait are normal.   Motor Observation: Feet with a dusky red roofer when dependent, bluish tint.    Tone:    Normal muscle tone.    Posture:    Posture is normal. normal erect    Strength:    Strength is V/V in the upper and lower limbs.      Sensation: decreased distally to pin prick and temp to mid calf     Reflex Exam:  DTR's:    Deep tendon reflexes in the upper and lower extremities are normal bilaterally.   Toes:    The toes are downgoing bilaterally.   Clonus:    Clonus is absent.       Assessment/Plan:  Very nice 47 year old female with pre-diabetes on metformin hgbac 5.9 who is here for evaluation of progressive severe painful neuropathy in the feet with burning and numbness, color discoloration, cramping in the toes.  She has been told in the  past that her neuropathy due to prediabetes.  - It is true that pre-diabetes can cause a neuropathy however patient's symptoms are out of proportion to what I would expect from her well controlled symptoms and hemoglobin A1c 5.9 - Unusual finding on exam was dependent rubor, dusky red and bluish discoloration as she sat in the office that improved when we put her feet up.I couldn't feel a Dorsalis Pedis pulse.  I wonder if there is some underlying peripheral vascular issues and contacted Dr. Donnetta Hutching for guidance.  - will order extensive panel of labs and emg/ncs - consider skin biopsy and genetic panel after workup above  Orders Placed This Encounter  Procedures  . Sedimentation rate  . ANA w/Reflex  . Sjogren's syndrome antibods(ssa + ssb)  . Pan-ANCA  . B. burgdorfi Antibody  . Vitamin B1  . Methylmalonic acid, serum  . B12 and Folate Panel  . RPR  . Hepatitis C antibody  . Tissue transglutaminase, IgA  . Gliadin antibodies, serum  . Rheumatoid factor  . Heavy metals, blood  . Vitamin B6  . NCV with EMG(electromyography)     Cc: Dr. Lavina Hamman, [facility] [address] [place], [place] [address]  Phone [phone] Fax [phone]

## 2017-09-22 ENCOUNTER — Telehealth: Payer: Self-pay | Admitting: Neurology

## 2017-09-22 ENCOUNTER — Encounter (INDEPENDENT_AMBULATORY_CARE_PROVIDER_SITE_OTHER): Payer: Self-pay

## 2017-09-22 ENCOUNTER — Encounter: Payer: Self-pay | Admitting: Neurology

## 2017-09-22 NOTE — Telephone Encounter (Signed)
[name], can you send this to patient's pharmacy [facility] please? She reports she has the knowledge to perform her own injections.   1042mcg b12 shots weekly for 4 weeks and then once a month for 6 months.

## 2017-09-23 LAB — B. BURGDORFI ANTIBODIES: Lyme IgG/IgM Ab: <0.91 {ISR} (ref 0.00–0.90)

## 2017-09-23 LAB — METHYLMALONIC ACID, SERUM: Methylmalonic Acid: 425 nmol/L — ABNORMAL HIGH (ref 0–378)

## 2017-09-23 LAB — ANA W/REFLEX: Anti Nuclear Antibody(ANA): NEGATIVE

## 2017-09-23 LAB — B12 AND FOLATE PANEL: VITAMIN B 12: 250 pg/mL (ref 232–1245)

## 2017-09-23 LAB — HEAVY METALS, BLOOD
Arsenic: 5 ug/L (ref 2–23)
Lead, Blood: NOT DETECTED ug/dL (ref 0–4)
Mercury: NOT DETECTED ug/L (ref 0.0–14.9)

## 2017-09-23 LAB — GLIADIN ANTIBODIES, SERUM
ANTIGLIADIN ABS, IGA: 3 U (ref 0–19)
Gliadin IgG: 3 units (ref 0–19)

## 2017-09-23 LAB — PAN-ANCA
ANCA Proteinase 3: <3.5 U/mL (ref 0.0–3.5)
Atypical pANCA: <1:20 {titer}
C-ANCA: <1:20 {titer}
Myeloperoxidase Ab: <9 U/mL (ref 0.0–9.0)
P-ANCA: <1:20 {titer}

## 2017-09-23 LAB — SJOGREN'S SYNDROME ANTIBODS(SSA + SSB)

## 2017-09-23 LAB — RHEUMATOID FACTOR: Rheumatoid fact SerPl-aCnc: <10 IU/mL (ref 0.0–13.9)

## 2017-09-23 LAB — VITAMIN B1: Thiamine: 142.3 nmol/L (ref 66.5–200.0)

## 2017-09-23 LAB — RPR: RPR Ser Ql: NONREACTIVE

## 2017-09-23 LAB — TISSUE TRANSGLUTAMINASE, IGA: Transglutaminase IgA: <2 U/mL (ref 0–3)

## 2017-09-23 LAB — HEPATITIS C ANTIBODY: Hep C Virus Ab: <0.1 s/co ratio (ref 0.0–0.9)

## 2017-09-23 LAB — SEDIMENTATION RATE: SED RATE: 13 mm/h (ref 0–32)

## 2017-09-23 LAB — VITAMIN B6: Vitamin B6: 6.1 ug/L (ref 2.0–32.8)

## 2017-09-23 MED ORDER — CYANOCOBALAMIN 1000 MCG/ML IJ SOLN: 1000.0000 ug | INTRAMUSCULAR | 0 refills | Status: DC

## 2017-09-23 MED ORDER — CYANOCOBALAMIN 1000 MCG/ML IJ SOLN: 1000.0000 ug | INTRAMUSCULAR | 5 refills | Status: DC

## 2017-09-23 MED FILL — CYANOCOBALAMIN 1,000 MCG/ML: 1000 | 28 days supply | Qty: 4 | Fill #0

## 2017-09-23 NOTE — Addendum Note (Signed)
Addended by: Gildardo Griffes on: 09/23/2017 11:20 AM   Modules accepted: Orders

## 2017-09-23 NOTE — Telephone Encounter (Signed)
Order placed

## 2017-09-26 ENCOUNTER — Encounter: Payer: Self-pay | Admitting: Neurology

## 2017-09-27 ENCOUNTER — Other Ambulatory Visit: Payer: Self-pay | Admitting: Neurology

## 2017-09-27 MED ORDER — AMITRIPTYLINE HCL 50 MG PO TABS: 50.0000 mg | ORAL_TABLET | Freq: Every day | ORAL | 11 refills | Status: DC

## 2017-09-29 MED FILL — ALPRAZolam 1 MG TABS: 1 | 30 days supply | Qty: 60 | Fill #2

## 2017-09-29 MED FILL — GABAPENTIN 600 MG TABS: 600 | 90 days supply | Qty: 270 | Fill #0

## 2017-09-29 MED FILL — AMITRIPTYLINE HCL 50 MG TAB: 50 | 30 days supply | Qty: 30 | Fill #0

## 2017-10-06 NOTE — Telephone Encounter (Signed)
No, please cancel emg/ncs. If her symptoms do not improve with B12 therapy then we consider emg/ncs in the future. thanks

## 2017-10-06 NOTE — Telephone Encounter (Signed)
Pt said since she has B12 deficiency does she still need to have NCV/EMG?  Please call to advise at [phone]

## 2017-10-06 NOTE — Telephone Encounter (Signed)
I called the pt, relayed the msg and c/a the appt. She was very Patent attorney

## 2017-10-17 ENCOUNTER — Other Ambulatory Visit: Payer: Self-pay | Admitting: Neurology

## 2017-10-17 MED FILL — CYANOCOBALAMIN 1,000 MCG/ML: 1000 | 30 days supply | Qty: 1 | Fill #0

## 2017-10-17 NOTE — Telephone Encounter (Signed)
Spoke with Vincente Liberty at [facility]. She stated that the pt had already filled her weekly dose of B12 1000 mcg x 4. They now need a prescription for the monthly dose x 6. She stated either they didn't receive the order or it was d/c'd d/t possibly looking like a duplicate order. RN advised Vincente Liberty that new order would be placed for the monthly 1000 mcg replacement.    Refill sent for the monthly dose.

## 2017-10-19 MED ORDER — LIDOCAINE-PRILOCAINE 2.5-2.5 % EX CREA: 1.0000 "application " | TOPICAL_CREAM | CUTANEOUS | 0 refills | Status: DC | PRN

## 2017-10-19 MED FILL — LIDOCAINE-PRILOCAINE CREAM: 2.5-2.5 | 30 days supply | Qty: 30 | Fill #0

## 2017-10-19 MED FILL — ESZOPICLONE 2 MG TAB: 2 | 30 days supply | Qty: 30 | Fill #1

## 2017-10-25 MED FILL — AMITRIPTYLINE HCL 50 MG TAB: 50 | 30 days supply | Qty: 30 | Fill #1

## 2017-10-31 ENCOUNTER — Other Ambulatory Visit: Payer: Self-pay

## 2017-10-31 MED FILL — RESTASIS 0.05% EYE EMULSION: 0.05 | 30 days supply | Qty: 60 | Fill #2

## 2017-10-31 MED FILL — metFORMIN HCL 1000 MG TABS: 1000 | 90 days supply | Qty: 180 | Fill #1

## 2017-10-31 NOTE — Telephone Encounter (Signed)
This with 1 refill needs follow-up office visit

## 2017-11-01 ENCOUNTER — Other Ambulatory Visit: Payer: Self-pay | Admitting: Neurology

## 2017-11-01 DIAGNOSIS — E538 Deficiency of other specified B group vitamins: Secondary | ICD-10-CM

## 2017-11-01 MED ORDER — ALPRAZOLAM 1 MG PO TABS: 1.0000 mg | ORAL_TABLET | Freq: Two times a day (BID) | ORAL | 1 refills | Status: DC | PRN

## 2017-11-01 MED FILL — ALPRAZolam 1 MG TABS: 1 | 30 days supply | Qty: 60 | Fill #0

## 2017-11-03 ENCOUNTER — Encounter: Payer: 59 | Admitting: Neurology

## 2017-11-16 ENCOUNTER — Other Ambulatory Visit: Payer: Self-pay | Admitting: Neurology

## 2017-11-16 ENCOUNTER — Other Ambulatory Visit (INDEPENDENT_AMBULATORY_CARE_PROVIDER_SITE_OTHER): Payer: Self-pay

## 2017-11-16 DIAGNOSIS — R7309 Other abnormal glucose: Secondary | ICD-10-CM

## 2017-11-16 DIAGNOSIS — E538 Deficiency of other specified B group vitamins: Secondary | ICD-10-CM

## 2017-11-16 DIAGNOSIS — Z0289 Encounter for other administrative examinations: Secondary | ICD-10-CM

## 2017-11-17 ENCOUNTER — Ambulatory Visit: Payer: 59 | Admitting: Family Medicine

## 2017-11-17 ENCOUNTER — Telehealth: Payer: Self-pay | Admitting: *Deleted

## 2017-11-17 ENCOUNTER — Encounter: Payer: Self-pay | Admitting: Family Medicine

## 2017-11-17 VITALS — BP 122/76 | HR 88 | Ht 65.0 in | Wt 201.8 lb

## 2017-11-17 DIAGNOSIS — R7303 Prediabetes: Secondary | ICD-10-CM

## 2017-11-17 DIAGNOSIS — G629 Polyneuropathy, unspecified: Secondary | ICD-10-CM | POA: Diagnosis not present

## 2017-11-17 DIAGNOSIS — G608 Other hereditary and idiopathic neuropathies: Secondary | ICD-10-CM

## 2017-11-17 LAB — HEMOGLOBIN A1C
ESTIMATED AVERAGE GLUCOSE: 120 mg/dL
HEMOGLOBIN A1C: 5.8 % — AB (ref 4.8–5.6)

## 2017-11-17 NOTE — Telephone Encounter (Signed)
Pt wanted as late as possible for her appt with diabetic education. She gets off work at [employer] and she wants appt in [place]. She can leave work early and wants latest appt available.

## 2017-11-17 NOTE — Progress Notes (Signed)
   Subjective:    Patient ID: [name], female    DOB: 07-06-1970, 47 y.o.   MRN: [id number] Patient arrives for protracted discussion regarding her neuropathy HPIpt has been here for a couple times about toes hurting. Saw Dr. Jaynee Eagles and was told it was neuropathy. Pt states her b12 was low and she does not know why she is taking metformin because she is not diabetic. Pt wants to know if she should be taking med or not.     A1C done yesterday 5.8.     Review of Systems No headache, no major weight loss or weight gain, no chest pain no back pain abdominal pain no change in bowel habits complete ROS otherwise negative     Objective:   Physical Exam  Alert and oriented, vitals reviewed and stable, NAD ENT-TM's and ext canals WNL bilat via otoscopic exam Soft palate, tonsils and post pharynx WNL via oropharyngeal exam Neck-symmetric, no masses; thyroid nonpalpable and nontender Pulmonary-no tachypnea or accessory muscle use; Clear without wheezes via auscultation Card--no abnrml murmurs, rhythm reg and rate WNL Carotid pulses symmetric, without bruits Feet pulses sufficient bilateral good capillary refill distal sensation diminished left greater than right foot.      Assessment & Plan:  Impression sensory neuropathy.  Discussed at great length.  Patient did have borderline low B12.  This may be a component.  However patient advised approximately 1 every 10 folks to develop diabetic related neuropathy actually developed a neuropathy before they actually diabetes.  In addition now on metformin.  Patient's eventual diagnosis may be delayed or avoided a true type 2 diabetes.  I do think the patient's hyperglycemia has to be considered a potential component in her neuropathy until proven otherwise, therefore definitely recommend ongoing maintenance of metformin.  Will also encouraged visit to diabetes education class since patient is extremely motivated to find out what she can do dietarily  speaking.  Greater than 50% of this 25 minute face to face visit was spent in counseling and discussion and coordination of care regarding the above diagnosis/diagnosies

## 2017-11-18 MED ORDER — GABAPENTIN 600 MG PO TABS: ORAL_TABLET | ORAL | 5 refills | Status: DC

## 2017-11-18 MED FILL — AMITRIPTYLINE HCL 50 MG TAB: 50 | 30 days supply | Qty: 30 | Fill #2

## 2017-11-18 MED FILL — FUROSEMIDE 20 MG TABS: 20 | 90 days supply | Qty: 90 | Fill #0

## 2017-11-18 MED FILL — METOPROLOL SUCCINATE ER 100: 100 | 90 days supply | Qty: 90 | Fill #0

## 2017-11-18 MED FILL — ESZOPICLONE 2 MG TAB: 2 | 30 days supply | Qty: 30 | Fill #2

## 2017-11-18 NOTE — Telephone Encounter (Signed)
Referral sent, dietician will contact pt directly to schedule

## 2017-11-18 NOTE — Telephone Encounter (Signed)
Patient has been taking 600 mg three times daily and takes an additional 600 mg with her third dose. Dr. Krista Blue gave verbal order 8/28 to make adjustment however pt had supply at that time.

## 2017-11-19 ENCOUNTER — Telehealth: Payer: 59 | Admitting: Physician Assistant

## 2017-11-19 DIAGNOSIS — T148XXA Other injury of unspecified body region, initial encounter: Principal | ICD-10-CM

## 2017-11-19 DIAGNOSIS — L089 Local infection of the skin and subcutaneous tissue, unspecified: Secondary | ICD-10-CM

## 2017-11-19 LAB — B12 AND FOLATE PANEL: FOLATE: 10.3 ng/mL (ref >3.0)

## 2017-11-19 LAB — METHYLMALONIC ACID, SERUM: Methylmalonic Acid: 138 nmol/L (ref 0–378)

## 2017-11-19 NOTE — Progress Notes (Signed)
Based on what you shared with me it looks like you have a condition that should be evaluated in a face to face office visit. Because [place] and we are uncertain what caused the issue, I want you to be seen today. It does look slightly infected. There needs to be a sample taken of any drainage so that we can see what the culprit bacteria is that is causing the infection, and so the correct antibiotics can be given. Please do not delay care.  NOTE: If you entered your credit card information for this eVisit, you will not be charged. You may see a "hold" on your card for the $30 but that hold will drop off and you will not have a charge processed.  If you are having a true medical emergency please call 911.  If you need an urgent face to face visit, [place] has four urgent care centers for your convenience.  If you need care fast and have a high deductible or no insurance consider:   [email or web address] to reserve your spot online an avoid wait times  [facility] [address] [place], [place] [address] 8 am to 8 pm Monday-Friday 10 am to 4 pm Saturday-Sunday *Across the street from [employer]  [place], [address] 8 am to 5 pm Monday-Friday * In the [facility] on the [facility]   The following sites will take your  insurance:  . [facility] [place] a Provider at this Location  [address] [place], [place] [address] . 10 am to 8 pm Monday-Friday . 12 pm to 8 pm Saturday-Sunday   . [facility] [place] a Provider at this Location  [address] [place], [place], [place] [address] . 8 am to 8 pm Monday-Friday . 9 am to 6 pm Saturday . 11 am to 6 pm Sunday   . [facility] [place] Get  Driving Directions  [address] [place],  [address] . 8 am to 8 pm Monday-Friday . 8 am to 4 pm Saturday-Sunday   Your e-visit answers were reviewed by a board certified advanced clinical practitioner to complete your personal care plan.  Thank you for using e-Visits.

## 2017-11-20 ENCOUNTER — Encounter: Payer: Self-pay | Admitting: Family Medicine

## 2017-11-22 MED FILL — PANTOPRAZOLE SOD DR 40 MG T: 40 | 90 days supply | Qty: 90 | Fill #0

## 2017-11-23 ENCOUNTER — Telehealth: Payer: Self-pay | Admitting: *Deleted

## 2017-11-23 MED ORDER — MINOCYCLINE HCL 100 MG PO CAPS: 100.0000 mg | ORAL_CAPSULE | Freq: Two times a day (BID) | ORAL | 2 refills | Status: DC

## 2017-11-23 MED FILL — MINOCYCLINE 100 MG CAPSULE: 100 | 30 days supply | Qty: 60 | Fill #0

## 2017-11-23 NOTE — Telephone Encounter (Signed)
Ok plus 2 ref 

## 2017-11-23 NOTE — Telephone Encounter (Signed)
[facility]ax [facility] requesting refill on minocycline 100mg  one bid. Pt takes for rosacea. Last seen 11/17/17.

## 2017-11-23 NOTE — Telephone Encounter (Signed)
Prescription sent electronically to pharmacy. 

## 2017-12-01 ENCOUNTER — Ambulatory Visit: Payer: 59 | Admitting: Registered"

## 2017-12-05 MED FILL — CYANOCOBALAMIN 1,000 MCG/ML: 1000 | 30 days supply | Qty: 1 | Fill #1

## 2017-12-05 MED FILL — ALPRAZolam 1 MG TABS: 1 | 30 days supply | Qty: 60 | Fill #1

## 2017-12-06 ENCOUNTER — Ambulatory Visit (INDEPENDENT_AMBULATORY_CARE_PROVIDER_SITE_OTHER): Payer: 59

## 2017-12-06 ENCOUNTER — Encounter: Payer: Self-pay | Admitting: Podiatry

## 2017-12-06 ENCOUNTER — Ambulatory Visit: Payer: 59 | Admitting: Podiatry

## 2017-12-06 VITALS — BP 131/80 | HR 88 | Resp 16

## 2017-12-06 DIAGNOSIS — M779 Enthesopathy, unspecified: Secondary | ICD-10-CM

## 2017-12-06 DIAGNOSIS — G629 Polyneuropathy, unspecified: Secondary | ICD-10-CM | POA: Diagnosis not present

## 2017-12-06 DIAGNOSIS — M778 Other enthesopathies, not elsewhere classified: Secondary | ICD-10-CM

## 2017-12-07 NOTE — Progress Notes (Signed)
Subjective:  Patient ID: [name], female    DOB: [birth year],  MRN: [id number] HPI Chief Complaint  Patient presents with  . Toe Pain    Toes bilateral - numbness x months, PCP did bloodwork and showed decreased B12, on metformin but not diabetic, saw nutritionist which recommended our office, she works at [employer] and [employer]-checked blood flow and was negative-feet look purple/red in color-taking B12 shots and seems to help some, also taking gabapentin 600mg  (one AM, one lunch, two HS)   . New Patient (Initial Visit)    47 y.o. female presents with the above complaint.   ROS: Denies fever chills nausea vomiting muscle aches pains calf pain back pain chest pain shortness of breath.  She states that she is only a borderline diabetic but is yet taking 2000 mg of metformin daily.  She states B12 shots have helped with some of the symptoms however they have stopped improving as she refers to the pain in the feet.  She states that she is running a high B12 at this point as she demonstrates from my chart also demonstrating hemoglobin A1c of 5.8.  She states her feet really started to bother her after she had discontinued the metformin for a period of time.  She states that she stopped the Metformin for several months her feet became very painful and discolored.  She had asked her PCP if she could stay off of the Metformin and her PCP recommended that she remain on the metformin.  Past Medical History:  Diagnosis Date  . Allergy   . Anxiety   . History of migraine headaches   . IBS (irritable bowel syndrome)   . Impaired fasting glucose    Past Surgical History:  Procedure Laterality Date  . ABDOMINAL HYSTERECTOMY  2005   partial  . BLADDER SUSPENSION  2012  . BREAST REDUCTION SURGERY  2002  . CHOLECYSTECTOMY  2005  . EYE SURGERY  1999   laser     Current Outpatient Medications:  .  ALPRAZolam (XANAX) 1 MG tablet, Take 1 tablet (1 mg total) by mouth 2 (two) times daily as needed. for  anxiety, Disp: 60 tablet, Rfl: 1 .  amitriptyline (ELAVIL) 50 MG tablet, Take 1 tablet (50 mg total) by mouth at bedtime., Disp: 30 tablet, Rfl: 11 .  cetirizine (ZYRTEC) 10 MG tablet, Take 1 tablet (10 mg total) by mouth daily., Disp: 30 tablet, Rfl: 5 .  cyanocobalamin (,VITAMIN B-12,) 1000 MCG/ML injection, Inject 1 mL (1,000 mcg total) into the muscle every 30 (thirty) days for 6 doses., Disp: 1 mL, Rfl: 5 .  cyclobenzaprine (FLEXERIL) 10 MG tablet, TAKE 1 TABLET BY MOUTH AT BEDTIME AS NEEDED FOR MUSCLE SPASMS., Disp: 90 tablet, Rfl: 2 .  eszopiclone (LUNESTA) 2 MG TABS tablet, Take 1 tablet (2 mg total) by mouth at bedtime as needed for sleep. Take immediately before bedtime, Disp: 30 tablet, Rfl: 2 .  furosemide (LASIX) 20 MG tablet, TAKE 1 TABLET (20 MG TOTAL) BY MOUTH DAILY., Disp: 90 tablet, Rfl: 1 .  gabapentin (NEURONTIN) 600 MG tablet, Take 600 mg by mouth three times a day with an additional tablet at bedtime., Disp: 270 tablet, Rfl: 5 .  lidocaine-prilocaine (EMLA) cream, Apply 1 application topically as needed. Apply to feet as needed for nerve pain, Disp: 30 g, Rfl: 0 .  metFORMIN (GLUCOPHAGE) 1000 MG tablet, TAKE 1 TABLET BY MOUTH 2 TIMES DAILY WITH A MEAL., Disp: 180 tablet, Rfl: 1 .  metoprolol succinate (  TOPROL-XL) 100 MG 24 hr tablet, TAKE 1 TABLET (100 MG TOTAL) BY MOUTH DAILY. TAKE WITH OR IMMEDIATELY FOLLOWING A MEAL., Disp: 90 tablet, Rfl: 1 .  minocycline (MINOCIN,DYNACIN) 100 MG capsule, Take 1 capsule (100 mg total) by mouth 2 (two) times daily., Disp: 60 capsule, Rfl: 2 .  pantoprazole (PROTONIX) 40 MG tablet, TAKE 1 TABLET BY MOUTH DAILY., Disp: 90 tablet, Rfl: 0 .  potassium chloride (K-DUR,KLOR-CON) 10 MEQ tablet, TAKE 2 TABLETS (20 MEQ TOTAL) BY MOUTH DAILY., Disp: 180 tablet, Rfl: 1 .  RESTASIS 0.05 % ophthalmic emulsion, PLACE 1 DROP IN [facility] EYE TWICE DAILY, Disp: , Rfl: 3 .  sertraline (ZOLOFT) 100 MG tablet, Take 1 tablet (100 mg total) by mouth daily., Disp: 90  tablet, Rfl: 1  Allergies  Allergen Reactions  . Omnicef [Cefdinir] Other (See Comments)    Patient states antibiotic does not work. Has tried multiple times.  . Losartan Rash    Broke out on face only; no allergic reaction   Review of Systems Objective:   Vitals:   12/06/17 1606  BP: 131/80  Pulse: 88  Resp: 16    General: Well developed, nourished, in no acute distress, alert and oriented x3   Dermatological: Skin is warm, dry and supple bilateral. Nails x 10 are well maintained; remaining integument appears unremarkable at this time. There are no open sores, no preulcerative lesions, no rash or signs of infection present.  Small wound to the medial aspect of the first metatarsophalangeal joint slowly healing it was an aquatic injury possibly caused by sea life.  No signs of infection.  Vascular: Dorsalis Pedis artery and Posterior Tibial artery pedal pulses are 2/4 bilateral with immedate capillary fill time. Pedal hair growth present. No varicosities and no lower extremity edema present bilateral.   Neruologic: Grossly intact via light touch bilateral. Vibratory i lost via tuning fork bilateral. Protective threshold with Semmes Wienstein monofilament diminished to all pedal sites bilateral to the level of her knee bilaterally. Patellar and Achilles deep tendon reflexes 2+ bilateral. No Babinski or clonus noted bilateral.   Musculoskeletal: No gross boney pedal deformities bilateral. No pain, crepitus, or limitation noted with foot and ankle range of motion bilateral. Muscular strength 5/5 in all groups tested bilateral.  Gait: Unassisted, Nonantalgic.    Radiographs:  Radiographs taken today demonstrate an osseously mature individual no signs of Charcot arthropathy no acute findings.  Assessment & Plan:   Assessment: Diabetic peripheral neuropathy with autonomic changes.  Plan: Discussed etiology pathology conservative versus surgical therapies at this point I recommended  that she try  NuRemedy for the nerve pain which is very similar to Metanx.  I recommend she continue B12.  I recommended that she check her blood sugar daily and that she continue her metformin.  She will examine her feet daily making sure that there are no cuts or scrapes.  She will notify us with any changes swelling redness pain lacerations open wounds.  At this point I do not feel that is necessary to perform any type of neurological exams.  Nerve conduction velocity exam is really not indicated with bilaterally symmetrical polyneuropathy encompassing all 6 nerves as they cross the ankle and leg.  I will follow-up with her in 6 months for diabetic foot check.    Jenni Thew T. [place], [place]

## 2017-12-09 ENCOUNTER — Encounter: Payer: 59 | Attending: Family Medicine | Admitting: Registered"

## 2017-12-09 ENCOUNTER — Encounter: Payer: Self-pay | Admitting: Registered"

## 2017-12-09 DIAGNOSIS — R7303 Prediabetes: Secondary | ICD-10-CM | POA: Diagnosis not present

## 2017-12-09 DIAGNOSIS — Z713 Dietary counseling and surveillance: Secondary | ICD-10-CM | POA: Insufficient documentation

## 2017-12-09 MED FILL — GABAPENTIN 600 MG TABLET: 600 | 60 days supply | Qty: 240 | Fill #0

## 2017-12-09 NOTE — Progress Notes (Signed)
Medical Nutrition Therapy:  Appt start time: 0815 end time:  0915.  Assessment:  Primary concerns today: Patient states she wants to understand why she is taking metformin without a diabetes diagnosis. Pt states she has been taking metformin for 4 hrs (A1c 5.8%)  Pt reports that her diet is not the best and would like help eating healthier.  Pt states due to her neuropathy her podiatrist told her that her A1c would be a lot higher if she was not taking metformin. Pt states due to history of low vitamin B12 she take OTC as well as weekly shots. Pt states she has not received education on foot care. RD did not have time to teach foot care this visit.  Patient states she has very low energy. Pt states she is working 40 hrs week temporarily due to staff shortage at work, was working 34 hrs.  Sleep: 11 pm wakes up 1 am back to sleep 3 am, gets up for day at 5:30. ~4.5 hrs total sleep. Pt states leg cramps wake her up as well as neuropathy pain in feet.Takes gapapentin & Lunesta. Pt states she watches TV or does puzzels on ipad before sleep to wind down before bed.  Pt states every other week is very busy taking care of grandson because her son has joint custody of his son and he works 10 hr days in [place] and gets home around 8 pm. Mornings are very busy lives in [place], takes grandon to daycare in [place] and has to be in [place] to work at 8 am. Pt states she is also responsible to feed him and get him ready for bed.  Pt states she has a lot of family stress in life including her own health issues as well as helping aging parents.   Patient drives by [employer] center on way home. There is a Lawyer in shopping center. RD would like to help patient have a plan for shopping and exercise after work. Did not discuss this week.  Patient states husband, in early 67's, had bypass surgery and quit smoking after (40 yrs?) RD did not assess second-hand smoke exposure, but this  could also be playing into her health issues.   Pt states she has IBS with Constipation/diarrhea/bloating with stress causing immediate diarrhea episodes. Future visits RD may suggest 8 week mindful meditation on-line course [email or web address]  Relevant problems: Acid relux, allergies (Cats, trees, dust, rag weed)  Meter provided: Contour Next Lot #: [id number] Exp: 10/23/2018 BG: 92  Preferred Learning Style:   No preference indicated   Learning Readiness:   Ready  MEDICATIONS: reviewed   DIETARY INTAKE:  24-hr recall:  B ( AM): fruit loops OR hashbrowns OR mini pancakes, coke (for energy) Snk ( AM): none (hungry but can't have food at front line) L ( PM): 2 apples, nabs or fruit loops  Snk ( PM): none D ( PM): hamburger, fry OR pizza Snk ( PM): PET ice cream sandwiches OR candy corn Beverages: flavored water (unless really cold), coke  Usual physical activity: ADLs because feet hurt  Estimated energy needs: 1600 calories  Progress Towards Goal(s):  New goals.   Nutritional Diagnosis:  NI-5.8.2 Excessive carbohydrate intake As related to unbalanced meals and snacks, high in carbs.  As evidenced by dietary recall, medication needed to control BG.    Intervention:  Nutrition Education. The Role of insulin and glucose in energy from the diet. Discussed the role of stress/sleep in health and  BG control. Discussed foods that are high in sodium.  Teaching Method Utilized:  Visual Auditory Hands on  Handouts given during visit include:  Nutrition in the fast lane  A1c chart  Overlapping symptoms  Barriers to learning/adherence to lifestyle change: reliance in fast food  Demonstrated degree of understanding via:  Teach Back   Monitoring/Evaluation:  Dietary intake, exercise, and body weight in 1 month(s).

## 2017-12-09 NOTE — Patient Instructions (Addendum)
Checking blood sugar fasting or before meals will give you an idea of how low your blood sugar is running. Checking 1.5 to 2 hours after a meal will give an idea of how high your blood sugar is running.   Getting 4-6 readings per week will give [place] an idea of you are staying in good control. Consider writing your numbers down and bring this log to your next appointment. Also check blood sugar when you have symptoms of low or high blood sugar. You may have a [employer] benefit of a free meter. Look into that if interested.   Look through the fastfood booklet and evaluate your current meals for sodium and carbohydrates and we will talk about next visit.  Getting a more substantial breakfast and lunch would be a good idea. We will talk more details at next visit. Lunch: an RX bar or similar protein bar with your apples would be an option for lunch until you find something convenient to take for lunch.  Consider looking into [employer] or [employer] [place] for support for you and your parents.  For leg cramps and help with sleep you may find a magnesium supplement helpful. Calm is a powdered form that seems to work well for many people. Follow directions on package. Also helps with constipation.

## 2017-12-13 ENCOUNTER — Other Ambulatory Visit: Payer: Self-pay | Admitting: Neurology

## 2017-12-13 DIAGNOSIS — R7303 Prediabetes: Secondary | ICD-10-CM | POA: Insufficient documentation

## 2017-12-13 MED FILL — SERTRALINE HCL 100 MG TAB: 100 | 90 days supply | Qty: 90 | Fill #1

## 2017-12-13 MED FILL — RESTASIS 0.05% EYE EMULSION: 0.05 | 30 days supply | Qty: 60 | Fill #3

## 2017-12-15 ENCOUNTER — Other Ambulatory Visit: Payer: Self-pay

## 2017-12-15 ENCOUNTER — Encounter (INDEPENDENT_AMBULATORY_CARE_PROVIDER_SITE_OTHER): Payer: 59

## 2017-12-15 ENCOUNTER — Other Ambulatory Visit: Payer: Self-pay | Admitting: Neurology

## 2017-12-15 DIAGNOSIS — E538 Deficiency of other specified B group vitamins: Secondary | ICD-10-CM

## 2017-12-15 MED ORDER — LIDOCAINE-PRILOCAINE 2.5-2.5 % EX CREA: 1.0000 "application " | TOPICAL_CREAM | CUTANEOUS | 6 refills | Status: DC | PRN

## 2017-12-16 ENCOUNTER — Other Ambulatory Visit: Payer: Self-pay | Admitting: Neurology

## 2017-12-16 DIAGNOSIS — R7309 Other abnormal glucose: Secondary | ICD-10-CM

## 2017-12-16 MED FILL — LIDOCAINE-PRILOCAINE CREAM: 2.5-2.5 | 25 days supply | Qty: 30 | Fill #0

## 2017-12-19 MED FILL — AMITRIPTYLINE HCL 50 MG TAB: 50 | 30 days supply | Qty: 30 | Fill #3

## 2017-12-23 ENCOUNTER — Encounter: Payer: Self-pay | Admitting: Family Medicine

## 2017-12-23 DIAGNOSIS — H52223 Regular astigmatism, bilateral: Secondary | ICD-10-CM | POA: Diagnosis not present

## 2017-12-23 DIAGNOSIS — H04123 Dry eye syndrome of bilateral lacrimal glands: Secondary | ICD-10-CM | POA: Diagnosis not present

## 2017-12-23 DIAGNOSIS — H5213 Myopia, bilateral: Secondary | ICD-10-CM | POA: Diagnosis not present

## 2017-12-23 DIAGNOSIS — H442E2 Degenerative myopia with other maculopathy, left eye: Secondary | ICD-10-CM | POA: Diagnosis not present

## 2017-12-23 MED ORDER — ESZOPICLONE 2 MG PO TABS: 2.0000 mg | ORAL_TABLET | Freq: Every evening | ORAL | 5 refills | Status: DC | PRN

## 2017-12-23 MED FILL — ESZOPICLONE 2 MG TAB: 2 | 30 days supply | Qty: 30 | Fill #0

## 2017-12-25 ENCOUNTER — Encounter: Payer: Self-pay | Admitting: Family Medicine

## 2017-12-26 ENCOUNTER — Other Ambulatory Visit: Payer: Self-pay

## 2017-12-26 DIAGNOSIS — D229 Melanocytic nevi, unspecified: Secondary | ICD-10-CM

## 2017-12-29 ENCOUNTER — Ambulatory Visit (INDEPENDENT_AMBULATORY_CARE_PROVIDER_SITE_OTHER): Payer: 59 | Admitting: Family Medicine

## 2018-01-05 ENCOUNTER — Other Ambulatory Visit: Payer: Self-pay | Admitting: Family Medicine

## 2018-01-05 ENCOUNTER — Ambulatory Visit (INDEPENDENT_AMBULATORY_CARE_PROVIDER_SITE_OTHER): Payer: 59 | Admitting: Family Medicine

## 2018-01-05 MED FILL — ALPRAZolam 1 MG TABS: 1 | 30 days supply | Qty: 60 | Fill #0

## 2018-01-05 NOTE — Telephone Encounter (Signed)
Six mo ok 

## 2018-01-06 ENCOUNTER — Ambulatory Visit: Payer: 59 | Admitting: Registered"

## 2018-01-12 ENCOUNTER — Ambulatory Visit (INDEPENDENT_AMBULATORY_CARE_PROVIDER_SITE_OTHER): Payer: 59 | Admitting: Family Medicine

## 2018-01-16 ENCOUNTER — Other Ambulatory Visit: Payer: Self-pay | Admitting: Family Medicine

## 2018-01-16 ENCOUNTER — Other Ambulatory Visit: Payer: Self-pay | Admitting: Neurology

## 2018-01-16 DIAGNOSIS — E538 Deficiency of other specified B group vitamins: Secondary | ICD-10-CM

## 2018-01-16 DIAGNOSIS — R5382 Chronic fatigue, unspecified: Secondary | ICD-10-CM

## 2018-01-16 DIAGNOSIS — E559 Vitamin D deficiency, unspecified: Secondary | ICD-10-CM

## 2018-01-16 MED FILL — CYCLOBENZAPRINE 10 MG TAB: 10 | 90 days supply | Qty: 90 | Fill #0

## 2018-01-16 MED FILL — POTASSIUM CHLORIDE CRYS ER: 10 | 90 days supply | Qty: 180 | Fill #0

## 2018-01-16 MED FILL — AMITRIPTYLINE HCL 50 MG TAB: 50 | 30 days supply | Qty: 30 | Fill #4

## 2018-01-16 MED FILL — CYANOCOBALAMIN 1,000 MCG/ML: 1000 | 30 days supply | Qty: 1 | Fill #2

## 2018-01-20 MED FILL — ESZOPICLONE 2 MG TAB: 2 | 30 days supply | Qty: 30 | Fill #1

## 2018-01-26 ENCOUNTER — Other Ambulatory Visit (INDEPENDENT_AMBULATORY_CARE_PROVIDER_SITE_OTHER): Payer: Self-pay

## 2018-01-26 DIAGNOSIS — R5382 Chronic fatigue, unspecified: Secondary | ICD-10-CM | POA: Diagnosis not present

## 2018-01-26 DIAGNOSIS — E538 Deficiency of other specified B group vitamins: Secondary | ICD-10-CM

## 2018-01-26 DIAGNOSIS — E559 Vitamin D deficiency, unspecified: Secondary | ICD-10-CM | POA: Diagnosis not present

## 2018-01-26 DIAGNOSIS — R7309 Other abnormal glucose: Secondary | ICD-10-CM

## 2018-01-26 DIAGNOSIS — Z0289 Encounter for other administrative examinations: Secondary | ICD-10-CM

## 2018-01-27 ENCOUNTER — Encounter: Payer: Self-pay | Admitting: Family Medicine

## 2018-01-27 MED FILL — RESTASIS 0.05% EYE EMULSION: 0.05 | 30 days supply | Qty: 60 | Fill #0

## 2018-01-30 LAB — HEMOGLOBIN A1C
ESTIMATED AVERAGE GLUCOSE: 134 mg/dL
HEMOGLOBIN A1C: 6.3 % — AB (ref 4.8–5.6)

## 2018-01-30 LAB — COMPREHENSIVE METABOLIC PANEL
ALT: 40 IU/L — AB (ref 0–32)
AST: 44 IU/L — AB (ref 0–40)
Albumin/Globulin Ratio: 1.6 (ref 1.2–2.2)
Albumin: 4.1 g/dL (ref 3.5–5.5)
Alkaline Phosphatase: 50 IU/L (ref 39–117)
BILIRUBIN TOTAL: 0.3 mg/dL (ref 0.0–1.2)
BUN/Creatinine Ratio: 12 (ref 9–23)
BUN: 9 mg/dL (ref 6–24)
CHLORIDE: 99 mmol/L (ref 96–106)
CO2: 24 mmol/L (ref 20–29)
Calcium: 9.1 mg/dL (ref 8.7–10.2)
Creatinine, Ser: 0.73 mg/dL (ref 0.57–1.00)
GFR calc Af Amer: 113 mL/min/{1.73_m2} (ref >59)
GFR calc non Af Amer: 98 mL/min/{1.73_m2} (ref >59)
GLUCOSE: 91 mg/dL (ref 65–99)
Globulin, Total: 2.5 g/dL (ref 1.5–4.5)
POTASSIUM: 4.5 mmol/L (ref 3.5–5.2)
Sodium: 139 mmol/L (ref 134–144)
Total Protein: 6.6 g/dL (ref 6.0–8.5)

## 2018-01-30 LAB — LIPID PANEL
CHOLESTEROL TOTAL: 215 mg/dL — AB (ref 100–199)
Chol/HDL Ratio: 5.8 ratio — ABNORMAL HIGH (ref 0.0–4.4)
HDL: 37 mg/dL — AB (ref >39)
LDL Calculated: 124 mg/dL — ABNORMAL HIGH (ref 0–99)
Triglycerides: 272 mg/dL — ABNORMAL HIGH (ref 0–149)
VLDL Cholesterol Cal: 54 mg/dL — ABNORMAL HIGH (ref 5–40)

## 2018-01-30 LAB — CBC
HEMOGLOBIN: 12.6 g/dL (ref 11.1–15.9)
Hematocrit: 36.2 % (ref 34.0–46.6)
MCH: 30.7 pg (ref 26.6–33.0)
MCHC: 34.8 g/dL (ref 31.5–35.7)
MCV: 88 fL (ref 79–97)
PLATELETS: 327 10*3/uL (ref 150–450)
RBC: 4.11 x10E6/uL (ref 3.77–5.28)
RDW: 13 % (ref 12.3–15.4)
WBC: 5.6 10*3/uL (ref 3.4–10.8)

## 2018-01-30 LAB — METHYLMALONIC ACID, SERUM: Methylmalonic Acid: 141 nmol/L (ref 0–378)

## 2018-01-30 LAB — TSH: TSH: 1.34 u[IU]/mL (ref 0.450–4.500)

## 2018-01-30 LAB — B12 AND FOLATE PANEL
Folate: 8.6 ng/mL (ref >3.0)
VITAMIN B 12: 1659 pg/mL — AB (ref 232–1245)

## 2018-01-30 LAB — VITAMIN D 25 HYDROXY (VIT D DEFICIENCY, FRACTURES): Vit D, 25-Hydroxy: 22.2 ng/mL — ABNORMAL LOW (ref 30.0–100.0)

## 2018-02-02 DIAGNOSIS — D485 Neoplasm of uncertain behavior of skin: Secondary | ICD-10-CM | POA: Diagnosis not present

## 2018-02-02 DIAGNOSIS — D1801 Hemangioma of skin and subcutaneous tissue: Secondary | ICD-10-CM | POA: Diagnosis not present

## 2018-02-02 DIAGNOSIS — L918 Other hypertrophic disorders of the skin: Secondary | ICD-10-CM | POA: Diagnosis not present

## 2018-02-02 DIAGNOSIS — D225 Melanocytic nevi of trunk: Secondary | ICD-10-CM | POA: Diagnosis not present

## 2018-02-06 MED FILL — GABAPENTIN 600 MG TABLET: 600 | 60 days supply | Qty: 240 | Fill #1

## 2018-02-06 MED FILL — ALPRAZolam 1 MG TABS: 1 | 30 days supply | Qty: 60 | Fill #1

## 2018-02-09 ENCOUNTER — Ambulatory Visit: Payer: 59 | Admitting: Family Medicine

## 2018-02-09 ENCOUNTER — Encounter: Payer: Self-pay | Admitting: Family Medicine

## 2018-02-09 VITALS — BP 114/76 | HR 80 | Ht 65.0 in | Wt 203.0 lb

## 2018-02-09 DIAGNOSIS — E538 Deficiency of other specified B group vitamins: Secondary | ICD-10-CM

## 2018-02-09 DIAGNOSIS — R7303 Prediabetes: Secondary | ICD-10-CM | POA: Diagnosis not present

## 2018-02-09 DIAGNOSIS — G608 Other hereditary and idiopathic neuropathies: Secondary | ICD-10-CM

## 2018-02-09 DIAGNOSIS — G629 Polyneuropathy, unspecified: Secondary | ICD-10-CM

## 2018-02-09 DIAGNOSIS — R7989 Other specified abnormal findings of blood chemistry: Secondary | ICD-10-CM | POA: Diagnosis not present

## 2018-02-09 NOTE — Progress Notes (Signed)
   Subjective:    Patient ID: [name], female    DOB: May 24, 1970, 47 y.o.   MRN: [id number] Patient arrives for a protracted discussion regarding her concerns. HPIpt arrives to go over skin biopsy results.   Pt had Dr. Jaynee Eagles order bloodwork and was told to follow up with DR. Richardson Landry to discuss results.   B12 justdone.  Of note patient's B12 was borderline low in the past.  The neurologist raised the concern that this might have potential contributing to her sensory neuropathy.  Now on monthly injections.  Numbers have gone very high.   Prediabetes,  A1c  borderine elev.  Now the A1c has risen even further.  Patient compliant with metformin.  Originally started by [employer].  Has gone and seen the dietitian.  Patient trying to follow new rules in this regard.  Compliant with them metformin with no obvious side effects.  Numbness still persists.    Blood work reveals high chol, high triglyceriedes.  Patient working on diet in this regard  Patient brings in multiple dermatopathology results.  Has Multi nvi, dysplastic , margins free    Review of Systems No headache, no major weight loss or weight gain, no chest pain no back pain abdominal pain no change in bowel habits complete ROS otherwise negative     Objective:   Physical Exam  Alert vitals stable, NAD. Blood pressure good on repeat. HEENT normal. Lungs clear. Heart regular rate and rhythm.       Assessment & Plan:  Impression #1 chronic sensory neuropathy.  Long discussion held.  In my view problem #2 is far more active contributor in problem #3.  2.  Prediabetes.  Discussed at length.  Patient maintain metformin work hard on diet.  3.  Borderline low B12.  Now resolved.  After finishing 6 months worth of injections recommend patient go to 1000 milliunits daily of B12 intake long-term  4.  Borderline low vitamin D.  Discussion held in this also.  Would recommend 2000 milliunits vitamin D3 daily supplement.  Also to take  long-term.  Follow-up as scheduled  Greater than 50% of this 25 minute face to face visit was spent in counseling and discussion and coordination of care regarding the above diagnosis/diagnosies

## 2018-02-09 NOTE — Patient Instructions (Signed)
Vit D3 2000 milliunits   One every morning for the rest of your life   B12 1000 milliunits take 3ach morn    High Cholesterol  High cholesterol is a condition in which the blood has high levels of a white, waxy, fat-like substance (cholesterol). The human body needs small amounts of cholesterol. The liver makes all the cholesterol that the body needs. Extra (excess) cholesterol comes from the food that we eat. Cholesterol is carried from the liver by the blood through the blood vessels. If you have high cholesterol, deposits (plaques) may build up on the walls of your blood vessels (arteries). Plaques make the arteries narrower and stiffer. Cholesterol plaques increase your risk for heart attack and stroke. Work with your health care provider to keep your cholesterol levels in a healthy range. What increases the risk? This condition is more likely to develop in people who:  Eat foods that are high in animal fat (saturated fat) or cholesterol.  Are overweight.  Are not getting enough exercise.  Have a family history of high cholesterol. What are the signs or symptoms? There are no symptoms of this condition. How is this diagnosed? This condition may be diagnosed from the results of a blood test.  If you are older than age 48, your health care provider may check your cholesterol every 4-6 years.  You may be checked more often if you already have high cholesterol or other risk factors for heart disease. The blood test for cholesterol measures:  "Bad" cholesterol (LDL cholesterol). This is the main type of cholesterol that causes heart disease. The desired level for LDL is less than 100.  "Good" cholesterol (HDL cholesterol). This type helps to protect against heart disease by cleaning the arteries and carrying the LDL away. The desired level for HDL is 60 or higher.  Triglycerides. These are fats that the body can store or burn for energy. The desired number for  triglycerides is lower than 150.  Total cholesterol. This is a measure of the total amount of cholesterol in your blood, including LDL cholesterol, HDL cholesterol, and triglycerides. A healthy number is less than 200. How is this treated? This condition is treated with diet changes, lifestyle changes, and medicines. Diet changes  This may include eating more whole grains, fruits, vegetables, nuts, and fish.  This may also include cutting back on red meat and foods that have a lot of added sugar. Lifestyle changes  Changes may include getting at least 40 minutes of aerobic exercise 3 times a week. Aerobic exercises include walking, biking, and swimming. Aerobic exercise along with a healthy diet can help you maintain a healthy weight.  Changes may also include quitting smoking. Medicines  Medicines are usually given if diet and lifestyle changes have failed to reduce your cholesterol to healthy levels.  Your health care provider may prescribe a statin medicine. Statin medicines have been shown to reduce cholesterol, which can reduce the risk of heart disease. Follow these instructions at home: Eating and drinking If told by your health care provider:  Eat chicken (without skin), fish, veal, shellfish, ground [place] breast, and round or loin cuts of red meat.  Do not eat fried foods or fatty meats, such as hot dogs and salami.  Eat plenty of fruits, such as apples.  Eat plenty of vegetables, such as broccoli, potatoes, and carrots.  Eat beans, peas, and lentils.  Eat grains such as barley, rice, couscous, and bulgur wheat.  Eat  pasta without cream sauces.  Use skim or nonfat milk, and eat low-fat or nonfat yogurt and cheeses.  Do not eat or drink whole milk, cream, ice cream, egg yolks, or hard cheeses.  Do not eat stick margarine or tub margarines that contain trans fats (also called partially hydrogenated oils).  Do not eat saturated tropical oils, such as coconut oil and  palm oil.  Do not eat cakes, cookies, crackers, or other baked goods that contain trans fats.  General instructions  Exercise as directed by your health care provider. Increase your activity level with activities such as gardening, walking, and taking the stairs.  Take over-the-counter and prescription medicines only as told by your health care provider.  Do not use any products that contain nicotine or tobacco, such as cigarettes and e-cigarettes. If you need help quitting, ask your health care provider.  Keep all follow-up visits as told by your health care provider. This is important. Contact a health care provider if:  You are struggling to maintain a healthy diet or weight.  You need help to start on an exercise program.  You need help to stop smoking. Get help right away if:  You have chest pain.  You have trouble breathing. This information is not intended to replace advice given to you by your health care provider. Make sure you discuss any questions you have with your health care provider. Document Released: 02/08/2005 Document Revised: 09/06/2015 Document Reviewed: 08/09/2015 Elsevier Interactive Patient Education  [employer].

## 2018-02-13 MED FILL — AMITRIPTYLINE HCL 50 MG TAB: 50 | 30 days supply | Qty: 30 | Fill #5

## 2018-02-14 ENCOUNTER — Telehealth: Payer: 59 | Admitting: Physician Assistant

## 2018-02-14 DIAGNOSIS — J00 Acute nasopharyngitis [common cold]: Secondary | ICD-10-CM

## 2018-02-14 MED ORDER — IPRATROPIUM BROMIDE 0.06 % NA SOLN: 2.0000 | Freq: Four times a day (QID) | NASAL | 0 refills | Status: DC

## 2018-02-14 MED FILL — IPRATROPIUM 0.06% SPRAY: 0.06 | 21 days supply | Qty: 15 | Fill #0

## 2018-02-14 NOTE — Progress Notes (Signed)
We are sorry you are not feeling well.  Here is how we plan to help!  Based on what you have shared with me, it looks like you may have a viral upper respiratory infection or a "common cold".  Colds are caused by a large number of viruses; however, rhinovirus is the most common cause.   Symptoms of the common cold vary from person to person, with common symptoms including sore throat, cough, and malaise.  A low-grade fever of 100.4 may present, but is often uncommon.  Symptoms vary however, and are closely related to a person's age or underlying illnesses.  The most common symptoms associated with the common cold are nasal discharge or congestion, cough, sneezing, headache and pressure in the ears and face.  Cold symptoms usually persist for about 3 to 10 days, but can last up to 2 weeks.  It is important to know that colds do not cause serious illness or complications in most cases.    The common cold is transmitted from person to person, with the most common method of transmission being a person's hands.  The virus is able to live on the skin and can infect other persons for up to 2 hours after direct contact.  Also, colds are transmitted when someone coughs or sneezes; thus, it is important to cover the mouth to reduce this risk.  To keep the spread of the common cold at [place], good hand hygiene is very important.  This is an infection that is most likely caused by a virus. There are no specific treatments for the common cold other than to help you with the symptoms until the infection runs its course.    For nasal congestion, you may use an oral decongestants such as Mucinex D or if you have glaucoma or high blood pressure use plain Mucinex.  Saline nasal spray or nasal drops can help and can safely be used as often as needed for congestion.  For your congestion, I have prescribed Ipratropium Bromide nasal spray 0.03% two sprays in each nostril 2-3 times a day  If you do not have a history of heart  disease, hypertension, diabetes or thyroid disease, prostate/bladder issues or glaucoma, you may also use Sudafed to treat nasal congestion.  It is highly recommended that you consult with a pharmacist or your primary care physician to ensure this medication is safe for you to take.     If you have a cough, you may use cough suppressants such as Delsym and Robitussin.    If you have a sore or scratchy throat, use a saltwater gargle-  to  teaspoon of salt dissolved in a 4-ounce to 8-ounce glass of warm water.  Gargle the solution for approximately 15-30 seconds and then spit.  It is important not to swallow the solution.  You can also use throat lozenges/cough drops and Chloraseptic spray to help with throat pain or discomfort.  Warm or cold liquids can also be helpful in relieving throat pain.  For headache, pain or general discomfort, you can use Ibuprofen or Tylenol as directed.   Some authorities believe that zinc sprays or the use of Echinacea may shorten the course of your symptoms.   HOME CARE Only take medications as instructed by your medical team. Be sure to drink plenty of fluids. Water is fine as well as fruit juices, sodas and electrolyte beverages. You may want to stay away from caffeine or alcohol. If you are nauseated, try taking small sips of liquids.  How do you know if you are getting enough fluid? Your urine should be a pale yellow or almost colorless. Get rest. Taking a steamy shower or using a humidifier may help nasal congestion and ease sore throat pain. You can place a towel over your head and breathe in the steam from hot water coming from a faucet. Using a saline nasal spray works much the same way. Cough drops, hard candies and sore throat lozenges may ease your cough. Avoid close contacts especially the very young and the elderly Cover your mouth if you cough or sneeze Always remember to wash your hands.   GET HELP RIGHT AWAY IF: You develop worsening fever. If  your symptoms do not improve within 10 days You develop yellow or green discharge from your nose over 3 days. You have coughing fits You develop a severe head ache or visual changes. You develop shortness of breath, difficulty breathing or start having chest pain  Your symptoms persist after you have completed your treatment plan  MAKE SURE YOU  Understand these instructions. Will watch your condition. Will get help right away if you are not doing well or get worse.  Your e-visit answers were reviewed by a board certified advanced clinical practitioner to complete your personal care plan. Depending upon the condition, your plan could have included both over the counter or prescription medications. Please review your pharmacy choice. If there is a problem, you may call our nursing hot line at and have the prescription routed to another pharmacy. Your safety is important to [place]. If you have drug allergies check your prescription carefully.   You can use MyChart to ask questions about today's visit, request a non-urgent call back, or ask for a work or school excuse for 24 hours related to this e-Visit. If it has been greater than 24 hours you will need to follow up with your provider, or enter a new e-Visit to address those concerns. You will get an e-mail in the next two days asking about your experience.  I hope that your e-visit has been valuable and will speed your recovery. Thank you for using e-visits.      ===View-only below this line===   ----- Message -----    From: [name]    Sent: 02/14/2018  9:53 AM EST      To: E-Visit Mailing List Subject: E-Visit Submission: Sinus Problems  E-Visit Submission: Sinus Problems --------------------------------  Question: Which of the following have you been experiencing? Answer:   Congested nose            Pain around the nose and face            Headache            Neck pain            Throat pain  Question: Have these symptoms  significantly worsened over the last two to three days? Answer:   Yes  Question: Describe your sore throat: Answer:   It hurts, and I cant breath out if my nose.  So iI'm breathing thru my mouth  Question: How long have you had a sore throat? Answer:   5 days  Question: Do you have any tenderness or swelling in your neck? Answer:   Yes  Question: Have you had any of the following? Answer:   Difficulty swallowing            Difficulty breathing  Question: Please enter a few details about your swallowing,  breathing, or visual problems. Answer:   My nose is stopped up and when i can get something out its green.  When I try to take my regular pills that i have to take they get stuck in my throat sometimes.  My headaches are above my eyebrows and around the bottom of my eyes to my nose  Question: How long have you been having these symptoms? Answer:   5 days  Question: Do you have a fever? Answer:   I do not know  Question: Do you smoke? Answer:   No  Question: Have you ever smoked? Answer:   I have never smoked  Question: Do you have any chronic illnesses, such as diabetes, heart disease, kidney disease, or lung disease, or any illness that would weaken your body's ability to fight infection? Answer:   No  Question: When you blow your nose, what color is the mucus? Answer:   Mostly thick and yellow or green  Question: Have you experienced similar problems in the past? Answer:   Yes  Question: What treatments have worked in the past?  Answer:   Dr. Mickie Hillier would write me a script for a antibotic when i went to see him in the office  Question: What treatment(s) in the past have been unsuccessful? Answer:     Question: Is this illness similar to previous illnesses you have had?  How is it the same?  How is it different? Answer:   I have allergies with the outdoors... trees dust mold cats.  The weather going up and down is not helping it  Question: Have you recently been  hospitalized? Answer:   No  Question: What medications are you currently taking for these symptoms? Answer:   Decongestants  Question: Please enter the names of any medications you are taking, or any other treatments you are trying. Answer:   Sudafed, Halls cough drops,  Question: Are you pregnant? Answer:   I am confident that I am not pregnant  Question: Are you breastfeeding? Answer:   No  Question: Please list your medication allergies that you may have ? (If 'none' , please list as 'none') Answer:   Sudafed, Halls cough drops, Chloraseptic Spray for my throat  Question: Please list any additional comments  Answer:

## 2018-02-17 ENCOUNTER — Ambulatory Visit: Payer: 59 | Admitting: Family Medicine

## 2018-02-20 MED FILL — LIDOCAINE-PRILOCAINE CREAM: 2.5-2.5 | 25 days supply | Qty: 30 | Fill #1

## 2018-02-20 MED FILL — FUROSEMIDE 20 MG TABS: 20 | 90 days supply | Qty: 90 | Fill #1

## 2018-02-20 MED FILL — METOPROLOL SUCCINATE ER 100: 100 | 90 days supply | Qty: 90 | Fill #1

## 2018-02-20 MED FILL — CYANOCOBALAMIN 1,000 MCG/ML: 1000 | 30 days supply | Qty: 1 | Fill #3

## 2018-02-23 ENCOUNTER — Other Ambulatory Visit: Payer: Self-pay

## 2018-02-23 MED ORDER — METFORMIN HCL 1000 MG PO TABS: ORAL_TABLET | ORAL | 1 refills | Status: DC

## 2018-02-23 MED ORDER — PANTOPRAZOLE SODIUM 40 MG PO TBEC: 40.0000 mg | DELAYED_RELEASE_TABLET | Freq: Every day | ORAL | 1 refills | Status: DC

## 2018-02-23 MED FILL — metFORMIN HCL 1000 MG TABS: 1000 | 90 days supply | Qty: 180 | Fill #0

## 2018-02-23 MED FILL — ESZOPICLONE 2 MG TAB: 2 | 30 days supply | Qty: 30 | Fill #2

## 2018-02-23 MED FILL — PANTOPRAZOLE SOD DR 40 MG T: 40 | 90 days supply | Qty: 90 | Fill #0

## 2018-03-05 ENCOUNTER — Telehealth: Payer: 59 | Admitting: Physician Assistant

## 2018-03-05 DIAGNOSIS — J019 Acute sinusitis, unspecified: Secondary | ICD-10-CM | POA: Diagnosis not present

## 2018-03-05 DIAGNOSIS — B9689 Other specified bacterial agents as the cause of diseases classified elsewhere: Secondary | ICD-10-CM

## 2018-03-05 MED ORDER — AMOXICILLIN-POT CLAVULANATE 875-125 MG PO TABS: 1.0000 | ORAL_TABLET | Freq: Two times a day (BID) | ORAL | 0 refills | Status: DC

## 2018-03-05 NOTE — Progress Notes (Signed)

## 2018-03-06 MED FILL — AMOX-CLAV 875-125 MG TABLET: 875-125 | 7 days supply | Qty: 14 | Fill #0

## 2018-03-07 MED FILL — RESTASIS 0.05% EYE EMULSION: 0.05 | 30 days supply | Qty: 60 | Fill #1

## 2018-03-13 MED FILL — ALPRAZolam 1 MG TABS: 1 | 30 days supply | Qty: 60 | Fill #2

## 2018-03-15 MED FILL — AMITRIPTYLINE HCL 50 MG TAB: 50 | 30 days supply | Qty: 30 | Fill #6

## 2018-03-16 MED FILL — CYANOCOBALAMIN 1,000 MCG/ML: 1000 | 30 days supply | Qty: 1 | Fill #4

## 2018-03-20 ENCOUNTER — Encounter: Payer: Self-pay | Admitting: Family Medicine

## 2018-03-20 ENCOUNTER — Other Ambulatory Visit: Payer: Self-pay

## 2018-03-20 MED ORDER — SERTRALINE HCL 100 MG PO TABS: 150.0000 mg | ORAL_TABLET | Freq: Every day | ORAL | 1 refills | Status: DC

## 2018-03-20 MED FILL — SERTRALINE HCL 100 MG TAB: 100 | 90 days supply | Qty: 135 | Fill #0

## 2018-04-03 DIAGNOSIS — Z1231 Encounter for screening mammogram for malignant neoplasm of breast: Secondary | ICD-10-CM | POA: Diagnosis not present

## 2018-04-03 DIAGNOSIS — Z6832 Body mass index (BMI) 32.0-32.9, adult: Secondary | ICD-10-CM | POA: Diagnosis not present

## 2018-04-03 DIAGNOSIS — Z01419 Encounter for gynecological examination (general) (routine) without abnormal findings: Secondary | ICD-10-CM | POA: Diagnosis not present

## 2018-04-03 MED FILL — VIT D2 1.25 MG (50,000 UNIT: 1.25 MG | 56 days supply | Qty: 8 | Fill #0

## 2018-04-03 MED FILL — ESZOPICLONE 2 MG TAB: 2 | 30 days supply | Qty: 30 | Fill #3

## 2018-04-04 MED FILL — RESTASIS 0.05% EYE EMULSION: 0.05 | 30 days supply | Qty: 60 | Fill #2

## 2018-04-05 MED FILL — GABAPENTIN 600 MG TABLET: 600 | 60 days supply | Qty: 240 | Fill #2 | Status: TO

## 2018-04-13 ENCOUNTER — Telehealth: Payer: 59 | Admitting: Physician Assistant

## 2018-04-13 DIAGNOSIS — L03032 Cellulitis of left toe: Secondary | ICD-10-CM

## 2018-04-13 MED ORDER — SULFAMETHOXAZOLE-TRIMETHOPRIM 800-160 MG PO TABS: 1.0000 | ORAL_TABLET | Freq: Two times a day (BID) | ORAL | 0 refills | Status: DC

## 2018-04-13 NOTE — Progress Notes (Signed)
E Visit for Cellulitis  We are sorry that you are not feeling well. Here is how we plan to help!  Based on what you shared with me it looks like you have cellulitis.  Cellulitis looks like areas of skin redness, swelling, and warmth; it develops as a result of bacteria entering under the skin. Little red spots and/or bleeding can be seen in skin, and tiny surface sacs containing fluid can occur. Fever can be present. Cellulitis is almost always on one side of a body, and the lower limbs are the most common site of involvement.   I have prescribed:  Bactrim DS 1 tablet by mouth BID for 7 days   Looking at the pictures I am concerned the area also may need to be drained in office if not responding quickly to medicine given. Start the antibiotic ASAP. See if your primary care provider can follow up with you early next week. ER for any worsening symptoms or development of fever.  HOME CARE: . Take your medications as ordered and take all of them, even if the skin irritation appears to be healing.   GET HELP RIGHT AWAY IF:  . Symptoms that don't begin to go away within 48 hours. . Severe redness persists or worsens . If the area turns color, spreads or swells. . If it blisters and opens, develops yellow-brown crust or bleeds. . You develop a fever or chills. . If the pain increases or becomes unbearable.  . Are unable to keep fluids and food down.  MAKE SURE YOU    Understand these instructions.  Will watch your condition.  Will get help right away if you are not doing well or get worse.  Thank you for choosing an e-visit. Your e-visit answers were reviewed by a board certified advanced clinical practitioner to complete your personal care plan. Depending upon the condition, your plan could have included both over the counter or prescription medications. Please review your pharmacy choice. Make sure the pharmacy is open so you can pick up prescription now. If there is a problem, you may  contact your provider through [employer] and have the prescription routed to another pharmacy. Your safety is important to [place]. If you have drug allergies check your prescription carefully.  For the next 24 hours you can use MyChart to ask questions about today's visit, request a non-urgent call back, or ask for a work or school excuse. You will get an email in the next two days asking about your experience. I hope that your e-visit has been valuable and will speed your recovery.

## 2018-04-13 NOTE — Progress Notes (Signed)
I have spent greater than five minutes and review of a visit questionnaire, patient's chart including time for updating chart, medical decision-making and responding to patient.  Leeanne Rio, PA-C

## 2018-04-14 MED FILL — SULFAMETHOXAZOLE-TMP DS TAB: 800-160 | 7 days supply | Qty: 14 | Fill #0

## 2018-04-14 MED FILL — ALPRAZolam 1 MG TABS: 1 | 30 days supply | Qty: 60 | Fill #3

## 2018-04-17 MED FILL — AMITRIPTYLINE HCL 50 MG TAB: 50 | 30 days supply | Qty: 30 | Fill #7

## 2018-04-24 MED FILL — CYANOCOBALAMIN 1,000 MCG/ML: 1000 | 30 days supply | Qty: 1 | Fill #5

## 2018-04-25 ENCOUNTER — Ambulatory Visit: Payer: 59 | Admitting: Family Medicine

## 2018-04-27 ENCOUNTER — Other Ambulatory Visit: Payer: Self-pay | Admitting: Neurology

## 2018-04-27 DIAGNOSIS — E538 Deficiency of other specified B group vitamins: Secondary | ICD-10-CM

## 2018-04-29 MED FILL — ESZOPICLONE 2 MG TAB: 2 | 30 days supply | Qty: 30 | Fill #4 | Status: TO

## 2018-05-02 MED FILL — LIDOCAINE-PRILOCAINE CREAM: 2.5-2.5 | 25 days supply | Qty: 30 | Fill #2 | Status: TO

## 2018-05-06 MED FILL — RESTASIS 0.05% EYE EMULSION: 0.05 | 90 days supply | Qty: 180 | Fill #3

## 2018-05-12 MED FILL — AMITRIPTYLINE HCL 50 MG TAB: 50 | 30 days supply | Qty: 30 | Fill #8 | Status: TO

## 2018-05-12 MED FILL — ALPRAZolam 1 MG TABS: 1 | 30 days supply | Qty: 60 | Fill #4 | Status: TO

## 2018-05-12 MED FILL — metFORMIN HCL 1000 MG TABS: 1000 | 90 days supply | Qty: 180 | Fill #1

## 2018-05-24 ENCOUNTER — Telehealth: Payer: Self-pay | Admitting: Family Medicine

## 2018-05-24 MED ORDER — METOPROLOL SUCCINATE ER 100 MG PO TB24: 100.0000 mg | ORAL_TABLET | Freq: Every day | ORAL | 0 refills | Status: DC

## 2018-05-24 MED FILL — GABAPENTIN 600 MG TABLET: 600 | 90 days supply | Qty: 360 | Fill #0

## 2018-05-24 MED FILL — METOPROLOL SUCCINATE ER 100: 100 | 90 days supply | Qty: 90 | Fill #0

## 2018-05-24 NOTE — Telephone Encounter (Signed)
Patient almost out of metoprolol succinate (TOPROL-XL) 100 MG 24 hr tablet. She said the pharmacy was suppose to send a refill on Monday but I don't see one in the chart. Send to [facility]

## 2018-05-24 NOTE — Telephone Encounter (Signed)
Three mo worth

## 2018-05-24 NOTE — Telephone Encounter (Signed)
Prescription sent electronically to pharmacy. Patient notified. 

## 2018-05-26 ENCOUNTER — Ambulatory Visit: Payer: 59 | Admitting: Family Medicine

## 2018-05-26 MED FILL — FLUCONAZOLE 150 MG TABS: 150 | 1 days supply | Qty: 1 | Fill #0

## 2018-05-27 MED FILL — ESZOPICLONE 2 MG TAB: 2 | 30 days supply | Qty: 30 | Fill #0

## 2018-05-29 ENCOUNTER — Telehealth: Payer: Self-pay | Admitting: Family Medicine

## 2018-05-29 ENCOUNTER — Other Ambulatory Visit: Payer: Self-pay

## 2018-05-29 MED ORDER — FUROSEMIDE 20 MG PO TABS: 20.0000 mg | ORAL_TABLET | Freq: Every day | ORAL | 1 refills | Status: DC

## 2018-05-29 MED FILL — FUROSEMIDE 20 MG TABS: 20 | 90 days supply | Qty: 90 | Fill #0

## 2018-05-29 MED FILL — PANTOPRAZOLE SOD DR 40 MG T: 40 | 90 days supply | Qty: 90 | Fill #0

## 2018-05-29 NOTE — Telephone Encounter (Signed)
Error

## 2018-06-05 MED FILL — AMITRIPTYLINE HCL 50 MG TAB: 50 | 30 days supply | Qty: 30 | Fill #0

## 2018-06-12 ENCOUNTER — Other Ambulatory Visit: Payer: Self-pay | Admitting: Neurology

## 2018-06-12 DIAGNOSIS — E538 Deficiency of other specified B group vitamins: Secondary | ICD-10-CM

## 2018-06-12 DIAGNOSIS — R7303 Prediabetes: Secondary | ICD-10-CM

## 2018-06-12 MED ORDER — CYANOCOBALAMIN 1000 MCG/ML IJ SOLN: 1000.0000 ug | INTRAMUSCULAR | 12 refills | Status: DC

## 2018-06-12 MED FILL — CYANOCOBALAMIN 1,000 MCG/ML: 1000 | 90 days supply | Qty: 3 | Fill #0

## 2018-06-12 NOTE — Progress Notes (Signed)
restrt B12 injections due to worsening symptoms

## 2018-06-13 ENCOUNTER — Other Ambulatory Visit: Payer: Self-pay | Admitting: *Deleted

## 2018-06-13 ENCOUNTER — Other Ambulatory Visit: Payer: Self-pay

## 2018-06-13 ENCOUNTER — Other Ambulatory Visit (INDEPENDENT_AMBULATORY_CARE_PROVIDER_SITE_OTHER): Payer: Self-pay

## 2018-06-13 DIAGNOSIS — E538 Deficiency of other specified B group vitamins: Secondary | ICD-10-CM | POA: Diagnosis not present

## 2018-06-13 DIAGNOSIS — Z0289 Encounter for other administrative examinations: Secondary | ICD-10-CM

## 2018-06-13 DIAGNOSIS — R7303 Prediabetes: Secondary | ICD-10-CM

## 2018-06-13 MED ORDER — CYANOCOBALAMIN 1000 MCG/ML IJ SOLN: 1000.0000 ug | INTRAMUSCULAR | 12 refills | Status: DC

## 2018-06-13 MED FILL — LIDOCAINE-PRILOCAINE CREAM: 2.5-2.5 | 25 days supply | Qty: 30 | Fill #0

## 2018-06-13 MED FILL — ALPRAZolam 1 MG TABS: 1 | 30 days supply | Qty: 60 | Fill #0

## 2018-06-15 LAB — HEMOGLOBIN A1C
Est. average glucose Bld gHb Est-mCnc: 131 mg/dL
Hgb A1c MFr Bld: 6.2 % — ABNORMAL HIGH (ref 4.8–5.6)

## 2018-06-15 LAB — METHYLMALONIC ACID, SERUM: Methylmalonic Acid: 140 nmol/L (ref 0–378)

## 2018-06-15 LAB — B12 AND FOLATE PANEL
Folate: 6.2 ng/mL (ref >3.0)
Vitamin B-12: 1067 pg/mL (ref 232–1245)

## 2018-06-15 NOTE — Telephone Encounter (Signed)
Spoke with [name] @ [facility] and discussed that pt didn't get her needles & syringes with B12. They had a prescription that got mailed out that didn't include notes for needles & syringes and then a second prescription that did include them. She will make a note so pt can get them going forward and pt will have to go to the pharmacy to get the first set.

## 2018-06-27 ENCOUNTER — Other Ambulatory Visit: Payer: Self-pay | Admitting: Family Medicine

## 2018-06-27 MED FILL — ESZOPICLONE 2 MG TABLET: 2 | 30 days supply | Qty: 30 | Fill #0

## 2018-06-27 MED FILL — FLUCONAZOLE 150 MG TABS: 150 | 1 days supply | Qty: 1 | Fill #1

## 2018-06-28 MED FILL — MINOCYCLINE HCL 100 MG CAPS: 100 | 30 days supply | Qty: 60 | Fill #0

## 2018-07-14 ENCOUNTER — Ambulatory Visit (INDEPENDENT_AMBULATORY_CARE_PROVIDER_SITE_OTHER): Payer: 59 | Admitting: Family Medicine

## 2018-07-14 ENCOUNTER — Encounter: Payer: Self-pay | Admitting: Family Medicine

## 2018-07-14 ENCOUNTER — Other Ambulatory Visit: Payer: Self-pay

## 2018-07-14 DIAGNOSIS — I10 Essential (primary) hypertension: Secondary | ICD-10-CM

## 2018-07-14 DIAGNOSIS — R7303 Prediabetes: Secondary | ICD-10-CM | POA: Diagnosis not present

## 2018-07-14 DIAGNOSIS — G629 Polyneuropathy, unspecified: Secondary | ICD-10-CM | POA: Diagnosis not present

## 2018-07-14 DIAGNOSIS — F419 Anxiety disorder, unspecified: Secondary | ICD-10-CM | POA: Diagnosis not present

## 2018-07-14 DIAGNOSIS — F329 Major depressive disorder, single episode, unspecified: Secondary | ICD-10-CM

## 2018-07-14 DIAGNOSIS — F32A Depression, unspecified: Secondary | ICD-10-CM

## 2018-07-14 DIAGNOSIS — G608 Other hereditary and idiopathic neuropathies: Secondary | ICD-10-CM

## 2018-07-14 MED ORDER — POTASSIUM CHLORIDE CRYS ER 10 MEQ PO TBCR: EXTENDED_RELEASE_TABLET | ORAL | 1 refills | Status: DC

## 2018-07-14 MED ORDER — METOPROLOL SUCCINATE ER 100 MG PO TB24: 100.0000 mg | ORAL_TABLET | Freq: Every day | ORAL | 1 refills | Status: DC

## 2018-07-14 MED ORDER — ALPRAZOLAM 1 MG PO TABS: 1.0000 mg | ORAL_TABLET | Freq: Two times a day (BID) | ORAL | 5 refills | Status: DC | PRN

## 2018-07-14 MED ORDER — METFORMIN HCL 1000 MG PO TABS: ORAL_TABLET | ORAL | 1 refills | Status: DC

## 2018-07-14 MED ORDER — PANTOPRAZOLE SODIUM 40 MG PO TBEC: 40.0000 mg | DELAYED_RELEASE_TABLET | Freq: Every day | ORAL | 1 refills | Status: DC

## 2018-07-14 MED ORDER — FUROSEMIDE 20 MG PO TABS: 20.0000 mg | ORAL_TABLET | Freq: Every day | ORAL | 1 refills | Status: DC

## 2018-07-14 NOTE — Progress Notes (Signed)
   Subjective:    Patient ID: [name], female    DOB: Sep 27, 1970, 48 y.o.   MRN: [id number] Patient presents for follow-up of numerous concerns Hypertension  This is a chronic problem. The current episode started more than 1 year ago. Risk factors for coronary artery disease include post-menopausal state. Treatments tried: metoprolol. There are no compliance problems.    BP this 123/89 pulse 98 Patient would like to review labs from 06/13/18  Review of Systems   Virtual Visit via Video Note  I connected with [name] on 07/14/18 at  9:30 AM EDT by a video enabled telemedicine application and verified that I am speaking with the correct person using two identifiers.  Location: Patient: home Provider: office    I discussed the limitations of evaluation and management by telemedicine and the availability of in person appointments. The patient expressed understanding and agreed to proceed.  History of Present Illness:    Observations/Objective:   Assessment and Plan:   Follow Up Instructions:    I discussed the assessment and treatment plan with the patient. The patient was provided an opportunity to ask questions and all were answered. The patient agreed with the plan and demonstrated an understanding of the instructions.   The patient was advised to call back or seek an in-person evaluation if the symptoms worsen or if the condition fails to improve as anticipated.  I provided 25 minutes of non-face-to-face time during this encounter.   Blood pressure medicine and blood pressure levels reviewed today with patient. Compliant with blood pressure medicine. States does not miss a dose. No obvious side effects. Blood pressure generally good when checked elsewhere. Watching salt intake.  bp overall good  Pulse high  Neuropathy overall really bad, but now has improved, patient really feels that B12 is doing that.  Patient is aware of my feeling that the prediabetes has a  lot to do with her sensory neuropathy.  Compliant with the Metformin.  Trying to watch her diet.  Exercise only so-so current  b12 shots once per mo handling better,   Pt under a lot of stress, moms in hospital, trying to handle everything.    zoloft wasn't helping.  So patient stopped it.  States definitely still needs Xanax.  No headache, no major weight loss or weight gain, no chest pain no back pain abdominal pain no change in bowel habits complete ROS otherwise negative     Objective:   Physical Exam   Virtual visit     Assessment & Plan:  Impression 1 hypertension.  Good control discussed maintain same meds  2.  Prediabetes.  Ongoing discussed.  A1c slightly better patient to maintain metformin  3.  Sensory neuropathy.  Multifactorial.  Now on B12 shots feels better with this but I believe prediabetes control is very important patient aware.  4.  Generalized anxiety disorder with recent flare.  Patient requests refills on Xanax are appropriate  Follow-up in 6 months diet exercise discussed  Greater than 50% of this 25 minute face to face visit was spent in counseling and discussion and coordination of care regarding the above diagnosis/diagnosies

## 2018-07-15 MED FILL — POTASSIUM CL ER 10 MEQ TAB: 10 | 90 days supply | Qty: 180 | Fill #0

## 2018-07-16 ENCOUNTER — Other Ambulatory Visit: Payer: Self-pay | Admitting: Family Medicine

## 2018-07-16 MED FILL — LIDOCAINE-PRILOCAINE CREAM: 2.5-2.5 | 25 days supply | Qty: 30 | Fill #1

## 2018-07-16 MED FILL — AMITRIPTYLINE HCL 50 MG TAB: 50 | 30 days supply | Qty: 30 | Fill #1

## 2018-07-19 ENCOUNTER — Encounter: Payer: Self-pay | Admitting: Family Medicine

## 2018-07-19 ENCOUNTER — Other Ambulatory Visit: Payer: Self-pay

## 2018-07-19 MED ORDER — METFORMIN HCL 1000 MG PO TABS: ORAL_TABLET | ORAL | 1 refills | Status: DC

## 2018-07-19 MED ORDER — POTASSIUM CHLORIDE CRYS ER 10 MEQ PO TBCR: EXTENDED_RELEASE_TABLET | ORAL | 1 refills | Status: DC

## 2018-07-19 MED ORDER — FUROSEMIDE 20 MG PO TABS: 20.0000 mg | ORAL_TABLET | Freq: Every day | ORAL | 1 refills | Status: DC

## 2018-07-19 MED ORDER — ESZOPICLONE 2 MG PO TABS: ORAL_TABLET | ORAL | 5 refills | Status: DC

## 2018-07-19 MED ORDER — METOPROLOL SUCCINATE ER 100 MG PO TB24: 100.0000 mg | ORAL_TABLET | Freq: Every day | ORAL | 1 refills | Status: DC

## 2018-07-19 MED ORDER — PANTOPRAZOLE SODIUM 40 MG PO TBEC: 40.0000 mg | DELAYED_RELEASE_TABLET | Freq: Every day | ORAL | 1 refills | Status: DC

## 2018-07-19 NOTE — Telephone Encounter (Signed)
Six mo ok 

## 2018-07-24 ENCOUNTER — Other Ambulatory Visit: Payer: Self-pay

## 2018-07-24 ENCOUNTER — Other Ambulatory Visit: Payer: Self-pay | Admitting: Neurology

## 2018-07-24 MED ORDER — AMITRIPTYLINE HCL 50 MG PO TABS: 50.0000 mg | ORAL_TABLET | Freq: Every day | ORAL | 4 refills | Status: DC

## 2018-07-24 MED ORDER — CYANOCOBALAMIN 1000 MCG/ML IJ SOLN: 1000.0000 ug | INTRAMUSCULAR | 11 refills | Status: DC

## 2018-07-24 MED ORDER — LIDOCAINE-PRILOCAINE 2.5-2.5 % EX CREA: 1.0000 "application " | TOPICAL_CREAM | CUTANEOUS | 6 refills | Status: DC | PRN

## 2018-07-24 MED ORDER — GABAPENTIN 600 MG PO TABS: ORAL_TABLET | ORAL | 5 refills | Status: DC

## 2018-07-24 MED ORDER — ALPRAZOLAM 1 MG PO TABS: 1.0000 mg | ORAL_TABLET | Freq: Two times a day (BID) | ORAL | 5 refills | Status: DC | PRN

## 2018-07-24 MED FILL — RESTASIS 0.05% EYE EMULSION: 0.05 | 30 days supply | Qty: 60 | Fill #0

## 2018-07-24 MED FILL — ESZOPICLONE 2 MG TABLET: 2 | 30 days supply | Qty: 30 | Fill #0

## 2018-07-24 MED FILL — metFORMIN HCL 1000 MG TABS: 1000 | 90 days supply | Qty: 180 | Fill #0

## 2018-07-25 ENCOUNTER — Encounter: Payer: Self-pay | Admitting: Family Medicine

## 2018-07-25 MED ORDER — METOPROLOL SUCCINATE ER 50 MG PO TB24: 50.0000 mg | ORAL_TABLET | Freq: Every day | ORAL | 1 refills | Status: DC

## 2018-07-25 MED FILL — METOPROLOL SUCCINATE ER 50: 50 | 90 days supply | Qty: 90 | Fill #0

## 2018-07-25 MED FILL — ALPRAZolam 1 MG TABS: 1 | 30 days supply | Qty: 60 | Fill #0

## 2018-07-25 NOTE — Telephone Encounter (Signed)
Prescription updated at pharmacy and patient notified via phone call.

## 2018-07-26 ENCOUNTER — Encounter: Payer: Self-pay | Admitting: Family Medicine

## 2018-07-26 NOTE — Telephone Encounter (Signed)
Per Baxter Flattery at [facility] the 50 mg was mailed out to the pt on 07/25/2018, and patient should have 27 days worth of the 100 mg from the 05/24/2018 fill date.

## 2018-08-04 MED FILL — LIDOCAINE-PRILOCAINE CREAM: 2.5-2.5 | 15 days supply | Qty: 30 | Fill #0

## 2018-08-08 MED FILL — AMITRIPTYLINE HCL 50 MG TAB: 50 | 90 days supply | Qty: 90 | Fill #0

## 2018-08-16 MED FILL — GABAPENTIN 600 MG TABLET: 600 | 90 days supply | Qty: 360 | Fill #1

## 2018-08-18 ENCOUNTER — Encounter: Payer: Self-pay | Admitting: Nurse Practitioner

## 2018-08-18 ENCOUNTER — Ambulatory Visit: Payer: 59 | Admitting: Nurse Practitioner

## 2018-08-18 ENCOUNTER — Other Ambulatory Visit: Payer: Self-pay

## 2018-08-18 ENCOUNTER — Telehealth: Payer: Self-pay | Admitting: Family Medicine

## 2018-08-18 VITALS — BP 130/82 | Temp 97.5°F | Ht 65.0 in | Wt 207.0 lb

## 2018-08-18 DIAGNOSIS — G629 Polyneuropathy, unspecified: Secondary | ICD-10-CM

## 2018-08-18 DIAGNOSIS — F329 Major depressive disorder, single episode, unspecified: Secondary | ICD-10-CM | POA: Diagnosis not present

## 2018-08-18 DIAGNOSIS — G608 Other hereditary and idiopathic neuropathies: Secondary | ICD-10-CM

## 2018-08-18 DIAGNOSIS — F419 Anxiety disorder, unspecified: Secondary | ICD-10-CM

## 2018-08-18 DIAGNOSIS — R7301 Impaired fasting glucose: Secondary | ICD-10-CM | POA: Diagnosis not present

## 2018-08-18 DIAGNOSIS — R7303 Prediabetes: Secondary | ICD-10-CM

## 2018-08-18 DIAGNOSIS — E65 Localized adiposity: Secondary | ICD-10-CM

## 2018-08-18 DIAGNOSIS — E781 Pure hyperglyceridemia: Secondary | ICD-10-CM

## 2018-08-18 DIAGNOSIS — F5104 Psychophysiologic insomnia: Secondary | ICD-10-CM | POA: Diagnosis not present

## 2018-08-18 DIAGNOSIS — F32A Depression, unspecified: Secondary | ICD-10-CM

## 2018-08-18 NOTE — Telephone Encounter (Signed)
Forget To ask [name] to increase her Lunesta to 3 mg. To [facility] pharmacy and also needs to know the normal range for sugar.  Said carolyn was suppose to write it down for her.

## 2018-08-18 NOTE — Progress Notes (Signed)
Subjective:    Patient ID: [name], female    DOB: Aug 20, 1970, 48 y.o.   MRN: [id number]  HPI  Patient arrives to follow up on neuropathy in her feet and legs. Patient is seeing neurology for her peripheral neuropathy.  Currently on a B12 supplement.  Had some recent lab work.  Her gabapentin dose is now up to 600 mg 4 times per day.  Has stopped her metoprolol since she is not having any significant palpitations at this point.  Admits to being under extreme stress due to her family situation.  Would like to increase her Lunesta dose to 3 mg to help with sleep.  Is also on amitriptyline 50 mg for her neuropathic symptoms.  Take Xanax 1 mg twice daily as needed for anxiety.  Has noticed significant weight gain lately particularly around the abdominal area.  Limited activity.  Has not done very well with her diet lately.  No abdominal pain.  No change in her bowel habits.  Review of Systems     Objective:   Physical Exam NAD.  Alert, oriented.  Mildly anxious affect.  Thoughts logical coherent and relevant.  Dressed appropriately.  Lungs clear.  Heart regular rate rhythm.  Abdomen extreme central obesity noted with faint pink striae noted along the lower abdomen.  Nontender to palpation but exam is limited.  Her weight is steadily gone up over the past year.  Her extremities are average size. Recent Results (from the past 2160 hour(s))  Methylmalonic acid, serum     Status: None   Collection Time: 06/13/18  8:11 AM  Result Value Ref Range   Methylmalonic Acid 140 0 - 378 nmol/L   Disclaimer: Comment     Comment: This test was developed and its performance characteristics determined by [facility]. It has not been cleared or approved by the Food and Drug Administration.   B12 and Folate Panel     Status: None   Collection Time: 06/13/18  8:11 AM  Result Value Ref Range   Vitamin B-12 1,067 232 - 1,245 pg/mL   Folate 6.2 >3.0 ng/mL    Comment: A serum folate concentration of less than  3.1 ng/mL is considered to represent clinical deficiency.   Hemoglobin A1c     Status: Abnormal   Collection Time: 06/13/18  8:11 AM  Result Value Ref Range   Hgb A1c MFr Bld 6.2 (H) 4.8 - 5.6 %    Comment:          Prediabetes: 5.7 - 6.4          Diabetes: >6.4          Glycemic control for adults with diabetes: <7.0    Est. average glucose Bld gHb Est-mCnc 131 mg/dL         Assessment & Plan:   Problem List Items Addressed This Visit      Endocrine   Impaired fasting glucose     Nervous and Auditory   Peripheral sensory neuropathy     Other   Anxiety and depression   Central obesity   Chronic insomnia   Hypertriglyceridemia   Morbid obesity ([place])   Pre-diabetes - Primary     Meds ordered this encounter  Medications   Eszopiclone 3 MG TABS    Sig: Take 1 tablet (3 mg total) by mouth at bedtime. Take immediately before bedtime    Dispense:  30 tablet    Refill:  5    Please cancel other Lunesta prescriptions  Order Specific Question:   Supervising Provider    Answer:   Kathyrn Drown [phone]     Plan to repeat her lipid profile and liver function test in the near future after reviewing her lab work from 01/26/2018.  Patient is still in the prediabetic range but concerns about her abdominal weight gain.  Lengthy discussion regarding the importance of stress reduction regular activity and weight loss particularly around the abdominal area.  We will also try to order a glucose testing machine for patient. Return in about 3 months (around 11/18/2018) for recheck. Sooner if needed. 25 minutes was spent with the patient.  This statement verifies that 25 minutes was indeed spent with the patient.  More than 50% of this visit-total duration of the visit-was spent in counseling and coordination of care. The issues that the patient came in for today as reflected in the diagnosis (s) please refer to documentation for further details.

## 2018-08-19 ENCOUNTER — Encounter: Payer: Self-pay | Admitting: Nurse Practitioner

## 2018-08-19 DIAGNOSIS — F5104 Psychophysiologic insomnia: Secondary | ICD-10-CM | POA: Insufficient documentation

## 2018-08-19 DIAGNOSIS — E65 Localized adiposity: Secondary | ICD-10-CM | POA: Insufficient documentation

## 2018-08-19 MED ORDER — ESZOPICLONE 3 MG PO TABS: 3.0000 mg | ORAL_TABLET | Freq: Every day | ORAL | 5 refills | Status: DC

## 2018-08-19 MED FILL — ESZOPICLONE 3 MG TABS: 3 | 30 days supply | Qty: 30 | Fill #0

## 2018-08-21 ENCOUNTER — Other Ambulatory Visit: Payer: Self-pay | Admitting: Nurse Practitioner

## 2018-08-21 ENCOUNTER — Encounter: Payer: Self-pay | Admitting: Family Medicine

## 2018-08-21 MED FILL — ALPRAZolam 1 MG TABS: 1 | 30 days supply | Qty: 60 | Fill #1

## 2018-08-21 MED FILL — SERTRALINE HCL 100 MG TABS: 100 | 90 days supply | Qty: 135 | Fill #0

## 2018-08-21 MED FILL — PANTOPRAZOLE SOD DR 40 MG T: 40 | 90 days supply | Qty: 90 | Fill #0

## 2018-08-22 ENCOUNTER — Other Ambulatory Visit: Payer: Self-pay | Admitting: Nurse Practitioner

## 2018-08-22 MED ORDER — SERTRALINE HCL 100 MG PO TABS: ORAL_TABLET | ORAL | 0 refills | Status: DC

## 2018-08-23 ENCOUNTER — Encounter: Payer: Self-pay | Admitting: Family Medicine

## 2018-08-23 ENCOUNTER — Other Ambulatory Visit: Payer: Self-pay

## 2018-08-23 ENCOUNTER — Ambulatory Visit (INDEPENDENT_AMBULATORY_CARE_PROVIDER_SITE_OTHER): Payer: 59 | Admitting: Family Medicine

## 2018-08-23 DIAGNOSIS — M7022 Olecranon bursitis, left elbow: Secondary | ICD-10-CM

## 2018-08-23 MED ORDER — ETODOLAC 400 MG PO TABS: ORAL_TABLET | ORAL | 0 refills | Status: DC

## 2018-08-23 MED ORDER — DOXYCYCLINE HYCLATE 100 MG PO TABS: 100.0000 mg | ORAL_TABLET | Freq: Two times a day (BID) | ORAL | 0 refills | Status: DC

## 2018-08-23 NOTE — Progress Notes (Signed)
   Subjective:    Patient ID: [name], female    DOB: May 25, 1970, 48 y.o.   MRN: [id number]  HPI  Patient calls with left elbow pain and swelling for at least a week. Patient states she hit her elbow on dresser about a week ago and it swelled up and was painful. Patient states now some of the swelling has went down and it is throbbing. Patient states she is able to move her elbow and has function.  Virtual Visit via Video Note  I connected with [name] on 08/23/18 at  4:10 PM EDT by a video enabled telemedicine application and verified that I am speaking with the correct person using two identifiers.  Location: Patient: home Provider: office   I discussed the limitations of evaluation and management by telemedicine and the availability of in person appointments. The patient expressed understanding and agreed to proceed.  History of Present Illness:    Observations/Objective:   Assessment and Plan:   Follow Up Instructions:    I discussed the assessment and treatment plan with the patient. The patient was provided an opportunity to ask questions and all were answered. The patient agreed with the plan and demonstrated an understanding of the instructions.   The patient was advised to call back or seek an in-person evaluation if the symptoms worsen or if the condition fails to improve as anticipated.  I provided 18 minutes of non-face-to-face time during this encounter.  Patient struck elbow.  Subsequent redness.  Subsequent swelling.  Primarily over the olecranon region.  No fever no chills.  Decent range of motion of the elbow.  Substantial swelling now.    Review of Systems No headache, no major weight loss or weight gain, no chest pain no back pain abdominal pain no change in bowel habits complete ROS otherwise negative     Objective:   Physical Exam   Virtual.  Images study     Assessment & Plan:  Impression olecranon bursitis.  Patient educated as to  the nature of this.  Antibiotics prescribed.  Anti-inflammatory medicine prescribed.  Symptom care discussed.

## 2018-08-24 ENCOUNTER — Other Ambulatory Visit: Payer: Self-pay | Admitting: Nurse Practitioner

## 2018-08-24 DIAGNOSIS — G608 Other hereditary and idiopathic neuropathies: Secondary | ICD-10-CM

## 2018-08-24 DIAGNOSIS — R7303 Prediabetes: Secondary | ICD-10-CM

## 2018-08-24 DIAGNOSIS — R7989 Other specified abnormal findings of blood chemistry: Secondary | ICD-10-CM

## 2018-08-24 DIAGNOSIS — R7301 Impaired fasting glucose: Secondary | ICD-10-CM

## 2018-08-24 DIAGNOSIS — E559 Vitamin D deficiency, unspecified: Secondary | ICD-10-CM

## 2018-08-24 DIAGNOSIS — I10 Essential (primary) hypertension: Secondary | ICD-10-CM

## 2018-08-24 DIAGNOSIS — E65 Localized adiposity: Secondary | ICD-10-CM

## 2018-08-24 DIAGNOSIS — E249 Cushing's syndrome, unspecified: Secondary | ICD-10-CM

## 2018-08-24 DIAGNOSIS — E781 Pure hyperglyceridemia: Secondary | ICD-10-CM

## 2018-08-24 DIAGNOSIS — R945 Abnormal results of liver function studies: Secondary | ICD-10-CM

## 2018-08-29 ENCOUNTER — Telehealth: Payer: Self-pay | Admitting: Nurse Practitioner

## 2018-08-29 DIAGNOSIS — I10 Essential (primary) hypertension: Secondary | ICD-10-CM | POA: Diagnosis not present

## 2018-08-29 DIAGNOSIS — E65 Localized adiposity: Secondary | ICD-10-CM | POA: Diagnosis not present

## 2018-08-29 DIAGNOSIS — E249 Cushing's syndrome, unspecified: Secondary | ICD-10-CM | POA: Diagnosis not present

## 2018-08-29 DIAGNOSIS — E559 Vitamin D deficiency, unspecified: Secondary | ICD-10-CM | POA: Diagnosis not present

## 2018-08-29 DIAGNOSIS — R945 Abnormal results of liver function studies: Secondary | ICD-10-CM | POA: Diagnosis not present

## 2018-08-29 DIAGNOSIS — R7303 Prediabetes: Secondary | ICD-10-CM | POA: Diagnosis not present

## 2018-08-29 DIAGNOSIS — E781 Pure hyperglyceridemia: Secondary | ICD-10-CM | POA: Diagnosis not present

## 2018-08-29 DIAGNOSIS — G629 Polyneuropathy, unspecified: Secondary | ICD-10-CM | POA: Diagnosis not present

## 2018-08-29 DIAGNOSIS — R7301 Impaired fasting glucose: Secondary | ICD-10-CM | POA: Diagnosis not present

## 2018-08-29 NOTE — Telephone Encounter (Signed)
[facility] contacted office and states that provider ordered salivary test. Pt has been on steroids for a swollen elbow. Linda at [facility] that these orders need to be cancelled and new orders need to be sent in so patient can bring back salvia samples when she is finished with steroids. Please advise. Thank you.

## 2018-08-29 NOTE — Telephone Encounter (Signed)
I was not aware of her steroid prescription. Please cancel and have patient wait at least a week after her last dose before doing salivary tests. Tell her to hold off if she is given any steroids on the meantime. Thanks!

## 2018-08-30 LAB — HEPATIC FUNCTION PANEL
ALT: 71 IU/L — ABNORMAL HIGH (ref 0–32)
AST: 94 IU/L — ABNORMAL HIGH (ref 0–40)
Albumin: 4.6 g/dL (ref 3.8–4.8)
Alkaline Phosphatase: 72 IU/L (ref 39–117)
Bilirubin Total: 0.4 mg/dL (ref 0.0–1.2)
Bilirubin, Direct: 0.18 mg/dL (ref 0.00–0.40)
Total Protein: 6.9 g/dL (ref 6.0–8.5)

## 2018-08-30 LAB — LIPID PANEL
Chol/HDL Ratio: 6.9 ratio — ABNORMAL HIGH (ref 0.0–4.4)
Cholesterol, Total: 240 mg/dL — ABNORMAL HIGH (ref 100–199)
HDL: 35 mg/dL — ABNORMAL LOW (ref >39)
LDL Calculated: 136 mg/dL — ABNORMAL HIGH (ref 0–99)
Triglycerides: 345 mg/dL — ABNORMAL HIGH (ref 0–149)
VLDL Cholesterol Cal: 69 mg/dL — ABNORMAL HIGH (ref 5–40)

## 2018-08-30 LAB — VITAMIN D 25 HYDROXY (VIT D DEFICIENCY, FRACTURES): Vit D, 25-Hydroxy: 42.1 ng/mL (ref 30.0–100.0)

## 2018-08-31 ENCOUNTER — Encounter: Payer: Self-pay | Admitting: Family Medicine

## 2018-08-31 MED ORDER — DOXYCYCLINE HYCLATE 100 MG PO TABS: 100.0000 mg | ORAL_TABLET | Freq: Two times a day (BID) | ORAL | 0 refills | Status: DC

## 2018-08-31 MED ORDER — ETODOLAC 400 MG PO TABS: ORAL_TABLET | ORAL | 0 refills | Status: DC

## 2018-08-31 MED FILL — ETODOLAC 400 MG TABS: 400 | 10 days supply | Qty: 20 | Fill #0

## 2018-08-31 NOTE — Telephone Encounter (Signed)
Sure repeat both, but let pt know it often takes months for all signs and symtoms to go away

## 2018-08-31 NOTE — Addendum Note (Signed)
Addended by: Vicente Males on: 08/31/2018 03:20 PM   Modules accepted: Orders

## 2018-08-31 NOTE — Telephone Encounter (Signed)
Pt contacted. Refills of antibiotic and Lodine sent to [facility]. Pt verbalized understanding.

## 2018-08-31 NOTE — Telephone Encounter (Signed)
Contacted patient. Pt verbalized understanding. [facility] provided vials to collect sample of salvia. Pt will take samples to [facility] when she has been off steroid for a week. Pt states she is done with the antibiotic Doxycyline and has one more day of the Lodine. Pt states her elbow is a lot better but it is still bruised and a little swollen. Pt is wondering if she needs another round of antibiotics or Lodine. Please advise. Thank you

## 2018-09-04 ENCOUNTER — Other Ambulatory Visit: Payer: Self-pay | Admitting: Nurse Practitioner

## 2018-09-04 MED ORDER — ETODOLAC 400 MG PO TABS: ORAL_TABLET | ORAL | 0 refills | Status: DC

## 2018-09-04 MED FILL — CYANOCOBALAMIN 1,000 MCG/ML: 1000 | 90 days supply | Qty: 3 | Fill #1

## 2018-09-08 ENCOUNTER — Other Ambulatory Visit: Payer: Self-pay | Admitting: Nurse Practitioner

## 2018-09-08 DIAGNOSIS — R7301 Impaired fasting glucose: Secondary | ICD-10-CM | POA: Diagnosis not present

## 2018-09-08 DIAGNOSIS — E65 Localized adiposity: Secondary | ICD-10-CM | POA: Diagnosis not present

## 2018-09-08 DIAGNOSIS — E781 Pure hyperglyceridemia: Secondary | ICD-10-CM | POA: Diagnosis not present

## 2018-09-08 DIAGNOSIS — R945 Abnormal results of liver function studies: Secondary | ICD-10-CM | POA: Diagnosis not present

## 2018-09-08 DIAGNOSIS — R7303 Prediabetes: Secondary | ICD-10-CM | POA: Diagnosis not present

## 2018-09-08 DIAGNOSIS — E559 Vitamin D deficiency, unspecified: Secondary | ICD-10-CM | POA: Diagnosis not present

## 2018-09-08 DIAGNOSIS — G629 Polyneuropathy, unspecified: Secondary | ICD-10-CM | POA: Diagnosis not present

## 2018-09-08 DIAGNOSIS — I10 Essential (primary) hypertension: Secondary | ICD-10-CM | POA: Diagnosis not present

## 2018-09-08 DIAGNOSIS — E249 Cushing's syndrome, unspecified: Secondary | ICD-10-CM | POA: Diagnosis not present

## 2018-09-14 ENCOUNTER — Telehealth: Payer: Self-pay | Admitting: Family Medicine

## 2018-09-14 NOTE — Telephone Encounter (Signed)
Daven calling back to see if we have the results of her salivary test that she went and did at [facility] at the end of last week. She had difficulty with the staff working that day and wants to make sure her test was done.

## 2018-09-14 NOTE — Telephone Encounter (Signed)
Contacted [facility]. They do have the collection tubes and the results are pending. Misa at [facility] states that that the collection was taken on 09/08/2018 and the lab received it on 09/09/2018; there is a 6-15 day turn around if no problem. Contact patient and informed her of this, pt verbalized understanding.

## 2018-09-15 LAB — SALIVARY CORTISOL X2, TIMED
Salivary Cortisol 2nd Specimen: 0.256 ug/dL
Salivary Cortisol Baseline: 0.032 ug/dL

## 2018-09-16 ENCOUNTER — Telehealth: Payer: 59 | Admitting: Physician Assistant

## 2018-09-16 DIAGNOSIS — R1030 Lower abdominal pain, unspecified: Secondary | ICD-10-CM

## 2018-09-16 NOTE — Progress Notes (Signed)
Based on what you shared with me, I feel your condition warrants further evaluation and I recommend that you be seen for a face to face office visit. Giving your mention of intermittent lower abdominal pain in the absence of true typical  signs/symptoms of UTI, you need to be seen in person for examination and potential testing of urine, etc to determine cause of symptoms.  NOTE: If you entered your credit card information for this eVisit, you will not be charged. You may see a "hold" on your card for the $35 but that hold will drop off and you will not have a charge processed.  If you are having a true medical emergency please call 911.     For an urgent face to face visit, [facility] has five urgent care centers for your convenience:    [email or web address] to reserve your spot online an avoid wait times  [facility] (New Address!) [address], [place], [place] [address] *Just off [address], across the road from [place] hours of operation: Monday-Friday, 12 PM to 6 PM  Closed Saturday & Sunday   The following sites will take your insurance:  . [facility]    [phone]                  Get Driving Directions  [address], [place] [address] . 10 am to 8 pm Monday-Friday . 12 pm to 8 pm Saturday-Sunday   . [facility] at [place]                  Get Driving Directions  [address], [place], [place] [address] . 8 am to 8 pm Monday-Friday . 9 am to 6 pm Saturday . 11 am to 6 pm Sunday   . [facility] at [place]                  Get Driving Directions   [address], [place] [address] . 8 am to 8 pm Monday-Friday . 8 am to 4 pm Saturday-Sunday    . [facility] at [place]                    Get Driving Directions  [phone]  [address],  [place], [place] [address]  . Monday-Friday, 12 PM to 6 PM    Your e-visit answers were reviewed by a board certified advanced clinical practitioner to complete your personal care plan.  Thank you for using e-Visits.

## 2018-09-18 MED FILL — ALPRAZolam 1 MG TABS: 1 | 30 days supply | Qty: 60 | Fill #2

## 2018-09-18 MED FILL — ESZOPICLONE 3 MG TABS: 3 | 30 days supply | Qty: 30 | Fill #1

## 2018-09-22 ENCOUNTER — Other Ambulatory Visit: Payer: Self-pay

## 2018-09-22 ENCOUNTER — Encounter: Payer: Self-pay | Admitting: Nurse Practitioner

## 2018-09-22 ENCOUNTER — Ambulatory Visit (INDEPENDENT_AMBULATORY_CARE_PROVIDER_SITE_OTHER): Payer: 59 | Admitting: Nurse Practitioner

## 2018-09-22 VITALS — BP 120/72 | HR 117 | Temp 97.2°F | Ht 65.0 in | Wt 198.4 lb

## 2018-09-22 DIAGNOSIS — E65 Localized adiposity: Secondary | ICD-10-CM | POA: Diagnosis not present

## 2018-09-22 DIAGNOSIS — R748 Abnormal levels of other serum enzymes: Secondary | ICD-10-CM | POA: Insufficient documentation

## 2018-09-22 DIAGNOSIS — E781 Pure hyperglyceridemia: Secondary | ICD-10-CM | POA: Diagnosis not present

## 2018-09-22 DIAGNOSIS — R079 Chest pain, unspecified: Secondary | ICD-10-CM

## 2018-09-22 DIAGNOSIS — F419 Anxiety disorder, unspecified: Secondary | ICD-10-CM | POA: Diagnosis not present

## 2018-09-22 MED ORDER — OMEGA-3-ACID ETHYL ESTERS 1 G PO CAPS: 2.0000 g | ORAL_CAPSULE | Freq: Two times a day (BID) | ORAL | 2 refills | Status: DC

## 2018-09-22 MED FILL — OMEGA-3 ETHYL ESTERS 1 GM C: 1 | 30 days supply | Qty: 120 | Fill #0

## 2018-09-22 NOTE — Progress Notes (Signed)
   Subjective:    Patient ID: [name], female    DOB: 1970/09/07, 48 y.o.   MRN: [id number]  HPI  Patient arrives to discuss results of recent labs. Patient states her pulse has been running high again since stopping Toprol at visit 08/23/2018. States it was not helping. (Patient with history of medication treated tachycardia in the past) Patient states she has been under tremendous stress- mother has been on vent for over a week and yesterday family decided comfort care only. Has been taking Zoloft and Xanax daily.  Rare alcohol use. No tylenol or OTC herbals/med use. Having a flare up of IBS for the past couple of weeks with increased stress. Frequent loose stools. No fever or bloody stool. Mild LLQ tenderness for the past couple of days.  Also some increase in GERD symptoms; taking Protonix daily.  Occasional choking sensation. Localized left sided chest pain described as sharp. No specific triggers. Unassociated with activity. No SOB. Can last up to an hour then resolves.     Review of Systems     Objective:   Physical Exam NAD. Alert, oriented. Mildly anxious affect. Crying at times during visit. Thoughts logical, coherent and relevant.  Lungs clear. Heart RRR. No murmur or gallop noted. EKG: tachycardia otherwise normal. Abdomen soft, non distenteneded; mild epigastric and LLQ tenderness. No masses. No rebound or guarding.        Assessment & Plan:   Problem List Items Addressed This Visit      Other   Central obesity   Elevated liver enzymes   Relevant Orders   Hepatitis B Surface AntiBODY   Hepatitis C Antibody   Ferritin   US Abdomen Complete   Hypertriglyceridemia - Primary   Relevant Medications   omega-3 acid ethyl esters (LOVAZA) 1 g capsule    Other Visit Diagnoses    Anxiety       Chest pain, unspecified type       Relevant Orders   PR ELECTROCARDIOGRAM, COMPLETE     Meds ordered this encounter  Medications  . omega-3 acid ethyl esters (LOVAZA) 1 g  capsule    Sig: Take 2 capsules (2 g total) by mouth 2 (two) times daily.    Dispense:  120 capsule    Refill:  2    Order Specific Question:   Supervising Provider    Answer:   Sallee Lange A [[id number]]   Increase Zoloft to 1 1/2 tabs (150 mg) per day. Due to extreme stress, take Xanax 1/2 BID during the day then one at night for anxiety as needed (total 2 per day). Call back if needed.  Start Lovaza as directed. Continue to work on [employer] and some activity. Labs and [place] pending. Return in about 3 months (around 12/23/2018) for recheck. Repeat LFTs and lipid at that time.

## 2018-09-23 ENCOUNTER — Encounter: Payer: Self-pay | Admitting: Nurse Practitioner

## 2018-10-02 MED FILL — RESTASIS 0.05% EYE EMULSION: 0.05 | 30 days supply | Qty: 60 | Fill #1

## 2018-10-02 MED FILL — LIDOCAINE-PRILOCAINE CREAM: 2.5-2.5 | 15 days supply | Qty: 30 | Fill #1

## 2018-10-03 ENCOUNTER — Encounter: Payer: Self-pay | Admitting: Family Medicine

## 2018-10-05 DIAGNOSIS — R748 Abnormal levels of other serum enzymes: Secondary | ICD-10-CM | POA: Diagnosis not present

## 2018-10-06 ENCOUNTER — Ambulatory Visit (HOSPITAL_COMMUNITY): Payer: 59

## 2018-10-06 LAB — HEPATITIS C ANTIBODY: Hep C Virus Ab: <0.1 s/co ratio (ref 0.0–0.9)

## 2018-10-06 LAB — FERRITIN: Ferritin: 88 ng/mL (ref 15–150)

## 2018-10-06 LAB — HEPATITIS B SURFACE ANTIBODY,QUALITATIVE: Hep B Surface Ab, Qual: REACTIVE

## 2018-10-09 ENCOUNTER — Encounter: Payer: Self-pay | Admitting: Family Medicine

## 2018-10-11 ENCOUNTER — Other Ambulatory Visit: Payer: Self-pay

## 2018-10-11 ENCOUNTER — Ambulatory Visit (HOSPITAL_COMMUNITY)
Admission: RE | Admit: 2018-10-11 | Discharge: 2018-10-11 | Disposition: A | Discharge status: Home / Self Care | Payer: 59 | Source: Ambulatory Visit | Attending: Nurse Practitioner | Admitting: Nurse Practitioner

## 2018-10-11 DIAGNOSIS — K760 Fatty (change of) liver, not elsewhere classified: Secondary | ICD-10-CM | POA: Diagnosis not present

## 2018-10-11 DIAGNOSIS — R748 Abnormal levels of other serum enzymes: Secondary | ICD-10-CM | POA: Diagnosis not present

## 2018-10-13 ENCOUNTER — Other Ambulatory Visit: Payer: Self-pay | Admitting: Nurse Practitioner

## 2018-10-13 DIAGNOSIS — R748 Abnormal levels of other serum enzymes: Secondary | ICD-10-CM

## 2018-10-15 ENCOUNTER — Other Ambulatory Visit: Payer: Self-pay | Admitting: Nurse Practitioner

## 2018-10-16 MED FILL — OMEGA-3 ETHYL ESTERS 1 GM C: 1 | 30 days supply | Qty: 120 | Fill #1

## 2018-10-17 MED FILL — FUROSEMIDE 20 MG TABS: 20 | 90 days supply | Qty: 90 | Fill #1

## 2018-10-17 MED FILL — ALPRAZolam 1 MG TABS: 1 | 30 days supply | Qty: 60 | Fill #3

## 2018-10-17 MED FILL — ESZOPICLONE 3 MG TABS: 3 | 30 days supply | Qty: 30 | Fill #2

## 2018-10-25 MED FILL — CYCLOBENZAPRINE HCL 10 MG T: 10 | 90 days supply | Qty: 90 | Fill #0

## 2018-10-26 ENCOUNTER — Encounter: Payer: Self-pay | Admitting: Family Medicine

## 2018-10-27 MED FILL — RESTASIS 0.05% EYE EMULSION: 0.05 | 30 days supply | Qty: 60 | Fill #0

## 2018-10-31 DIAGNOSIS — R748 Abnormal levels of other serum enzymes: Secondary | ICD-10-CM | POA: Diagnosis not present

## 2018-10-31 MED FILL — AMITRIPTYLINE HCL 50 MG TAB: 50 | 90 days supply | Qty: 90 | Fill #1

## 2018-11-01 LAB — HEPATIC FUNCTION PANEL
ALT: 49 IU/L — ABNORMAL HIGH (ref 0–32)
AST: 66 IU/L — ABNORMAL HIGH (ref 0–40)
Albumin: 4.3 g/dL (ref 3.8–4.8)
Alkaline Phosphatase: 62 IU/L (ref 39–117)
Bilirubin Total: <0.2 mg/dL (ref 0.0–1.2)
Bilirubin, Direct: 0.07 mg/dL (ref 0.00–0.40)
Total Protein: 6.4 g/dL (ref 6.0–8.5)

## 2018-11-07 ENCOUNTER — Encounter: Payer: Self-pay | Admitting: Nurse Practitioner

## 2018-11-07 ENCOUNTER — Encounter: Payer: Self-pay | Admitting: Family Medicine

## 2018-11-07 DIAGNOSIS — K760 Fatty (change of) liver, not elsewhere classified: Secondary | ICD-10-CM | POA: Insufficient documentation

## 2018-11-13 ENCOUNTER — Other Ambulatory Visit: Payer: Self-pay | Admitting: Family Medicine

## 2018-11-13 MED FILL — SERTRALINE HCL 100 MG TAB: 100 | 90 days supply | Qty: 135 | Fill #0

## 2018-11-13 MED FILL — PANTOPRAZOLE SOD DR 40 MG T: 40 | 90 days supply | Qty: 90 | Fill #1

## 2018-11-14 MED FILL — GABAPENTIN 600 MG TABLET: 600 | 90 days supply | Qty: 360 | Fill #2

## 2018-11-15 ENCOUNTER — Other Ambulatory Visit: Payer: Self-pay | Admitting: Nurse Practitioner

## 2018-11-15 MED FILL — FLUCONAZOLE 150 MG TABLET: 150 | 4 days supply | Qty: 2 | Fill #0

## 2018-11-15 MED FILL — metFORMIN HCL 1000 MG TABS: 1000 | 90 days supply | Qty: 180 | Fill #1

## 2018-11-15 MED FILL — OMEGA-3 ETHYL ESTERS 1 GM C: 1 | 30 days supply | Qty: 120 | Fill #2

## 2018-11-15 MED FILL — ALPRAZolam 1 MG TABS: 1 | 30 days supply | Qty: 60 | Fill #4

## 2018-11-15 MED FILL — ESZOPICLONE 3 MG TABS: 3 | 30 days supply | Qty: 30 | Fill #3

## 2018-11-16 ENCOUNTER — Telehealth: Payer: Self-pay | Admitting: *Deleted

## 2018-11-16 ENCOUNTER — Encounter: Payer: Self-pay | Admitting: Family Medicine

## 2018-11-16 DIAGNOSIS — K760 Fatty (change of) liver, not elsewhere classified: Secondary | ICD-10-CM

## 2018-11-16 DIAGNOSIS — R140 Abdominal distension (gaseous): Secondary | ICD-10-CM

## 2018-11-16 DIAGNOSIS — R748 Abnormal levels of other serum enzymes: Secondary | ICD-10-CM

## 2018-11-16 NOTE — Telephone Encounter (Signed)
Per [name]:  Please order a CT scan of the abdomen with contrast. She will need a Met 7 before (nonfasting is fine).  Diagnoses:  Non alcoholic fatty liver  Elevated liver enzymes  Abdominal bloating  R39.2 Increased liver echogenicity

## 2018-11-16 NOTE — Telephone Encounter (Signed)
Lab work and CT scan orders placed in [facility]. Pt has been notified via [place]

## 2018-12-01 DIAGNOSIS — R140 Abdominal distension (gaseous): Secondary | ICD-10-CM | POA: Diagnosis not present

## 2018-12-01 DIAGNOSIS — K760 Fatty (change of) liver, not elsewhere classified: Secondary | ICD-10-CM | POA: Diagnosis not present

## 2018-12-01 DIAGNOSIS — R748 Abnormal levels of other serum enzymes: Secondary | ICD-10-CM | POA: Diagnosis not present

## 2018-12-02 LAB — BASIC METABOLIC PANEL
BUN/Creatinine Ratio: 12 (ref 9–23)
BUN: 8 mg/dL (ref 6–24)
CO2: 22 mmol/L (ref 20–29)
Calcium: 9.8 mg/dL (ref 8.7–10.2)
Chloride: 98 mmol/L (ref 96–106)
Creatinine, Ser: 0.68 mg/dL (ref 0.57–1.00)
GFR calc Af Amer: 119 mL/min/{1.73_m2} (ref >59)
GFR calc non Af Amer: 103 mL/min/{1.73_m2} (ref >59)
Glucose: 141 mg/dL — ABNORMAL HIGH (ref 65–99)
Potassium: 4 mmol/L (ref 3.5–5.2)
Sodium: 138 mmol/L (ref 134–144)

## 2018-12-03 ENCOUNTER — Encounter: Payer: Self-pay | Admitting: Nurse Practitioner

## 2018-12-04 ENCOUNTER — Other Ambulatory Visit: Payer: Self-pay | Admitting: *Deleted

## 2018-12-04 DIAGNOSIS — R748 Abnormal levels of other serum enzymes: Secondary | ICD-10-CM

## 2018-12-04 DIAGNOSIS — R932 Abnormal findings on diagnostic imaging of liver and biliary tract: Secondary | ICD-10-CM

## 2018-12-04 DIAGNOSIS — R140 Abdominal distension (gaseous): Secondary | ICD-10-CM

## 2018-12-04 MED FILL — CYANOCOBALAMIN 1,000 MCG/ML: 1000 | 90 days supply | Qty: 3 | Fill #2

## 2018-12-04 NOTE — Telephone Encounter (Signed)
Called pt and she was concerned that contrast had on it to drink for ct abd and pelvis and she was only scheduled for ct abd with contrast. I called [facility] and spoke with jennifer in radiology and she said all the bottles say that whether it is ct abdomen or ct abdomen and pelvis. Pt did say if she needed both she would rather go ahead and do both. Anderson Malta looked at ultrasound and suggested that she have a ct abdomen with and without focus liver. Do you want to change order or keep the same as carolyn ordered.

## 2018-12-04 NOTE — Telephone Encounter (Signed)
Left message to return call. Message in epic - carolyn order ct abdomen. She did not order ct abdomen and pelvis.

## 2018-12-04 NOTE — Telephone Encounter (Signed)
Called [facility] and test changed and pt was notified.

## 2018-12-07 ENCOUNTER — Ambulatory Visit (HOSPITAL_COMMUNITY): Payer: 59

## 2018-12-07 ENCOUNTER — Other Ambulatory Visit: Payer: Self-pay

## 2018-12-07 ENCOUNTER — Inpatient Hospital Stay (HOSPITAL_COMMUNITY): Admission: RE | Admit: 2018-12-07 | Payer: 59 | Source: Ambulatory Visit

## 2018-12-07 ENCOUNTER — Ambulatory Visit (HOSPITAL_COMMUNITY)
Admission: RE | Admit: 2018-12-07 | Discharge: 2018-12-07 | Disposition: A | Discharge status: Home / Self Care | Payer: 59 | Source: Ambulatory Visit | Attending: Family Medicine | Admitting: Family Medicine

## 2018-12-07 DIAGNOSIS — R748 Abnormal levels of other serum enzymes: Secondary | ICD-10-CM | POA: Insufficient documentation

## 2018-12-07 DIAGNOSIS — R932 Abnormal findings on diagnostic imaging of liver and biliary tract: Secondary | ICD-10-CM | POA: Diagnosis not present

## 2018-12-07 DIAGNOSIS — K760 Fatty (change of) liver, not elsewhere classified: Secondary | ICD-10-CM | POA: Diagnosis not present

## 2018-12-07 DIAGNOSIS — R140 Abdominal distension (gaseous): Secondary | ICD-10-CM | POA: Insufficient documentation

## 2018-12-07 MED ORDER — IOHEXOL 300 MG/ML  SOLN
100.0000 mL | Freq: Once | INTRAMUSCULAR | Status: AC | PRN
  Administered 2018-12-07: 100 mL via INTRAVENOUS

## 2018-12-07 MED ORDER — IOHEXOL 9 MG/ML PO SOLN
ORAL | Status: AC
  Filled 2018-12-07: qty 500

## 2018-12-08 ENCOUNTER — Encounter: Payer: Self-pay | Admitting: Nurse Practitioner

## 2018-12-08 ENCOUNTER — Ambulatory Visit: Payer: 59 | Admitting: Nurse Practitioner

## 2018-12-08 VITALS — BP 136/84 | HR 111 | Temp 97.4°F | Wt 201.4 lb

## 2018-12-08 DIAGNOSIS — R000 Tachycardia, unspecified: Secondary | ICD-10-CM

## 2018-12-08 DIAGNOSIS — K760 Fatty (change of) liver, not elsewhere classified: Secondary | ICD-10-CM | POA: Diagnosis not present

## 2018-12-08 DIAGNOSIS — L906 Striae atrophicae: Secondary | ICD-10-CM | POA: Diagnosis not present

## 2018-12-08 DIAGNOSIS — E65 Localized adiposity: Secondary | ICD-10-CM

## 2018-12-08 NOTE — Progress Notes (Signed)
   Subjective:    Patient ID: [name], female    DOB: Oct 04, 1970, 48 y.o.   MRN: [id number]  HPI Pt here to discuss report of recent CT scan.  Has seen minimal improvement in her abdominal weight and distention despite changing her dietary habits.  Remains under stress but this is back to her normal level.  Patient attributes the weight gain to her gabapentin which has been increased to 600 mg 4 times daily by neurology for her bilateral foot pain.  She is also on amitriptyline 50 mg daily.  Denies any unusual bleeding or bruising.  Her blood pressure remains near normal most of the time, does continue to have heart rate over 100 on average all the time.  This was unimproved on metoprolol according to patient.  Review of Systems     Objective:   Physical Exam NAD.  Alert, oriented.  Calm affect.  Thoughts logical coherent and relevant.  Lungs clear.  Heart regular rate rhythm.  Continues to have distinct significant central adiposity.  Abdomen is slightly firm, nontender.  CT scan of the abdomen with and without contrast dated 12/07/2018 shows slight increase in diffuse hepatic to steatosis but no acute findings or abnormality.  Patient has large slightly purplish striae particularly on the left abdominal area.  Slight flushing to the face.  Her arms and legs are normal size, the main portion of her weight is in the center of her body.  Heart rate 111 and regular.        Assessment & Plan:   Problem List Items Addressed This Visit      Digestive   NAFL (nonalcoholic fatty liver) - Primary     Other   Central obesity   Tachycardia      Reviewed options for patient.  Will send her information on [place] program to help her with weight loss.  Refer to endocrinology for evaluation based on her symptoms.  Recheck here as needed.

## 2018-12-08 NOTE — Progress Notes (Signed)
Referral placed.

## 2018-12-08 NOTE — Addendum Note (Signed)
Addended by: Vicente Males on: 12/08/2018 04:44 PM   Modules accepted: Orders

## 2018-12-12 ENCOUNTER — Telehealth: Payer: Self-pay | Admitting: Neurology

## 2018-12-12 NOTE — Telephone Encounter (Signed)
Pt has called to state that she is unable to message Dr Jaynee Eagles via my chart (it was stated earlier by My Chart Support that because pt has not seen Dr Jaynee Eagles in over a year is why she can not access Dr Jaynee Eagles on my chart.  Support stated that once pt see's Dr Jaynee Eagles she will then regain her messaging access to Dr Jaynee Eagles again).  Pt stated she wanted a message sent to Dr Jaynee Eagles so she could know how much a NCV/EMG is and when she could be scheduled and she wants to know what would Dr Cathren Laine plan be based on result of the test.  Billing dept was reached out to.  Debbie stated:it varies & depends on how may nerves are tested & EMG is by extremities.This message was relayed to pt. Pt is asking for a call from [employer]

## 2018-12-13 ENCOUNTER — Encounter: Payer: Self-pay | Admitting: *Deleted

## 2018-12-13 ENCOUNTER — Ambulatory Visit (HOSPITAL_COMMUNITY): Payer: 59

## 2018-12-13 NOTE — Telephone Encounter (Signed)
Returned pt's call and LVM (ok per DPR) asking for call back to discuss her questions about the EMG. Left office number and hours in message.

## 2018-12-13 NOTE — Telephone Encounter (Signed)
SInce it has been over a year she would have to come back for a follow up first with myself or with Amy thanks

## 2018-12-13 NOTE — Telephone Encounter (Signed)
Sent pt a mychart message. 

## 2018-12-13 NOTE — Telephone Encounter (Signed)
Error

## 2018-12-14 MED FILL — RESTASIS 0.05% EYE EMULSION: 0.05 | 30 days supply | Qty: 60 | Fill #0

## 2018-12-14 MED FILL — ESZOPICLONE 3 MG TABS: 3 | 30 days supply | Qty: 30 | Fill #4

## 2018-12-14 MED FILL — ALPRAZolam 1 MG TABS: 1 | 30 days supply | Qty: 60 | Fill #5

## 2018-12-15 ENCOUNTER — Other Ambulatory Visit: Payer: Self-pay | Admitting: Nurse Practitioner

## 2018-12-18 ENCOUNTER — Encounter: Payer: Self-pay | Admitting: Family Medicine

## 2018-12-18 ENCOUNTER — Other Ambulatory Visit: Payer: Self-pay

## 2018-12-18 ENCOUNTER — Ambulatory Visit (INDEPENDENT_AMBULATORY_CARE_PROVIDER_SITE_OTHER): Payer: 59 | Admitting: Family Medicine

## 2018-12-18 VITALS — BP 132/98 | HR 113 | Temp 97.6°F | Ht 66.0 in | Wt 202.6 lb

## 2018-12-18 DIAGNOSIS — R748 Abnormal levels of other serum enzymes: Secondary | ICD-10-CM

## 2018-12-18 DIAGNOSIS — G629 Polyneuropathy, unspecified: Secondary | ICD-10-CM | POA: Diagnosis not present

## 2018-12-18 DIAGNOSIS — R7303 Prediabetes: Secondary | ICD-10-CM

## 2018-12-18 DIAGNOSIS — E538 Deficiency of other specified B group vitamins: Secondary | ICD-10-CM | POA: Diagnosis not present

## 2018-12-18 MED FILL — OMEGA-3 ETHYL ESTERS 1 GM C: 1 | 30 days supply | Qty: 120 | Fill #0

## 2018-12-18 NOTE — Progress Notes (Signed)
Completely agree Amy thank you. And since Gabapentin is only metabolized via the kidneys, it should have nothing to do with her fatty liver. thanks

## 2018-12-18 NOTE — Progress Notes (Addendum)
PATIENT: [name] DOB: 07-Aug-1970  REASON FOR VISIT: follow up HISTORY FROM: patient  Chief Complaint  Patient presents with   Follow-up    Room 2, alone. Concerned about the gabapentin and liver, Feet hurt still.      HISTORY OF PRESENT ILLNESS: Today 12/18/18 [name] is a 47 y.o. female here today for follow up for idiopathic neuropathy. She has a history of B12 deficiency, elevated liver enzymes thought to be related to fatty liver disease, prediabetes on metformin, and obesity. She was seen by Dr Jaynee Eagles in 08/2017 for concerns of bilateral feet numbness and pain. She reported that skin would turn purpleish/blue and sometimes red. Feet felt cold. She was evaluated by vascular and podiatry who thought symptoms were related to prediabetes. 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 She continues gabapentin 600mg  in am, 600 at lunch and 1200mg  at dinner. She is also taking B12 supplements. She feels B12 shots help with pain and numbness more so than oral supplementation. She has gotten new shoes and reports that feet feel better when she is wearing shoes. She reports that tingling occurs when she   She reports that she noticed that her stomach was getting larger. She has gained about 10 pounds over the past year. She was sent for CT of abdomen that revealed fatty liver. Cholesterol levels are elevated. She is uncertain what this means or how to treat.   HISTORY: (copied from  note on 09/20/2017)  HPI:  [name] is a 48 y.o. female here as a referral from Dr. Wolfgang Phoenix for neuropathy.  Past medical history of obesity and prediabetes on metformin.  Husband is here and also provides much information.  Started hurting about a year ago, feet started getting numb and turning blue. If she sits the foot will turn purplish blue and cold and they feel cold to the touch and throbbing. She gets cramps. No inciting event. She started metoprolol about a  year ago. But they stopped it and it didn;t improve and back on it and takes it at night. Started in the big toe and slowly progressed to the next. The pain starts in the knuckles and on the top of the foot. She has numbness and burning in the toes worse at night in bed. It can be severe, even the sheets touching makes it worse, rubbing helps, they feel cold, It gets a little red as well. Her big toe feels bigger, reclining doesn't help. Progressive. No hx of smoking, no hx of alcohol overuse, no chemotherapy or other drugs associated with polyneuropathy, mother has neuropathy but due to diabetes.  Reviewed notes, labs and imaging from outside physicians, which showed :  hgbac 5.9, b12 295,tsh nml, b was as low as two hundred thirteen    REVIEW OF SYSTEMS: Out of a complete 14 system review of symptoms, the patient complains only of the following symptoms, numbness, tingling and all other reviewed systems are negative.  ALLERGIES: Allergies  Allergen Reactions   Omnicef [Cefdinir] Other (See Comments)    Patient states antibiotic does not work. Has tried multiple times.   Losartan Rash    Broke out on face only; no allergic reaction    HOME MEDICATIONS: Outpatient Medications Prior to Visit  Medication Sig Dispense Refill   ALPRAZolam (XANAX) 1 MG tablet Take 1 tablet (1 mg total) by mouth 2 (two) times daily as needed. for anxiety 60 tablet 5   amitriptyline (ELAVIL)  50 MG tablet Take 1 tablet (50 mg total) by mouth at bedtime. 90 tablet 4   cyanocobalamin (,VITAMIN B-12,) 1000 MCG/ML injection Inject 1 mL (1,000 mcg total) into the muscle every 30 (thirty) days. 1 mL 11   cyclobenzaprine (FLEXERIL) 10 MG tablet TAKE 1 TABLET BY MOUTH AT BEDTIME AS NEEDED FOR MUSCLE SPASMS. 90 tablet 2   doxycycline (VIBRA-TABS) 100 MG tablet Take 1 tablet (100 mg total) by mouth 2 (two) times daily. 14 tablet 0   Eszopiclone 3 MG TABS Take 1 tablet (3 mg total) by mouth at bedtime. Take  immediately before bedtime 30 tablet 5   etodolac (LODINE) 400 MG tablet One tablet twice a day with food for 10 days 20 tablet 0   furosemide (LASIX) 20 MG tablet Take 1 tablet (20 mg total) by mouth daily. 90 tablet 1   gabapentin (NEURONTIN) 600 MG tablet Take 600 mg by mouth three times a day with an additional tablet at bedtime. 270 tablet 5   ipratropium (ATROVENT) 0.06 % nasal spray Place 2 sprays into both nostrils 4 (four) times daily. 15 mL 0   lidocaine-prilocaine (EMLA) cream Apply 1 application topically as needed. Apply to feet as needed for nerve pain 30 g 6   metFORMIN (GLUCOPHAGE) 1000 MG tablet TAKE 1 TABLET BY MOUTH 2 TIMES DAILY WITH A MEAL. 180 tablet 1   omega-3 acid ethyl esters (LOVAZA) 1 g capsule TAKE 2 CAPSULES (2 G TOTAL) BY MOUTH 2 (TWO) TIMES DAILY. 120 capsule 2   pantoprazole (PROTONIX) 40 MG tablet Take 1 tablet (40 mg total) by mouth daily. 90 tablet 1   potassium chloride (K-DUR) 10 MEQ tablet TAKE 2 TABLETS (20 MEQ TOTAL) BY MOUTH DAILY. 180 tablet 1   RESTASIS 0.05 % ophthalmic emulsion PLACE 1 DROP IN EACH EYE TWICE DAILY  3   sertraline (ZOLOFT) 100 MG tablet TAKE 1 & 1/2 TABLET BY MOUTH DAILY 135 tablet 0   fluconazole (DIFLUCAN) 150 MG tablet TAKE 1 TABLET BY MOUTH FOR 1 DOSE AS NEEDED FOR YEAST INFECTION; MAY REPEAT IN 3 TO 4 DAYS IF NEEDED 2 tablet 0   vitamin B-12 (CYANOCOBALAMIN) 1000 MCG tablet Take by mouth daily.     No facility-administered medications prior to visit.     PAST MEDICAL HISTORY: Past Medical History:  Diagnosis Date   Allergy    Anxiety    History of migraine headaches    IBS (irritable bowel syndrome)    Impaired fasting glucose     PAST SURGICAL HISTORY: Past Surgical History:  Procedure Laterality Date   ABDOMINAL HYSTERECTOMY  2005   partial   BLADDER SUSPENSION  2012   BREAST REDUCTION SURGERY  2002   CHOLECYSTECTOMY  2005   EYE SURGERY  1999   laser     FAMILY HISTORY: Family History    Problem Relation Age of Onset   Diabetes Mother    Neuropathy Mother    Atrial fibrillation Father    Hypertension Father     SOCIAL HISTORY: Social History   Socioeconomic History   Marital status: Married    Spouse name: Not on file   Number of children: 2   Years of education: Not on file   Highest education level: High school graduate  Occupational History    Employer: [employer]    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  Social Needs   Emergency planning/management officer strain: Not on file   Food insecurity    Worry: Not on file  Inability: Not on file   Transportation needs    Medical: Not on file    Non-medical: Not on file  Tobacco Use   Smoking status: Never Smoker   Smokeless tobacco: Never Used  Substance and Sexual Activity   Alcohol use: Yes    Comment: occasional    Drug use: Never   Sexual activity: Not on file  Lifestyle   Physical activity    Days per week: Not on file    Minutes per session: Not on file   Stress: Not on file  Relationships   Social connections    Talks on phone: Not on file    Gets together: Not on file    Attends religious service: Not on file    Active member of club or organization: Not on file    Attends meetings of clubs or organizations: Not on file    Relationship status: Not on file   Intimate partner violence    Fear of current or ex partner: Not on file    Emotionally abused: Not on file    Physically abused: Not on file    Forced sexual activity: Not on file  Other Topics Concern   Not on file  Social History Narrative   Lives at home with husband and dog   Right handed   Caffeine: 3 cups daily      PHYSICAL EXAM  Vitals:   12/18/18 1451  BP: (!) 132/98  Pulse: (!) 113  Temp: 97.6 F (36.4 C)  Weight: 202 lb 9.6 oz (91.9 kg)  Height: 5\' 6"  (1.676 m)   Body mass index is 32.7 kg/m.  Generalized: Well developed, in no acute distress  Cardiology: normal rate and rhythm, no murmur noted Respiratory:  Clear to auscultation bilaterally Neurological examination  Mentation: Alert oriented to time, place, history taking. Follows all commands speech and language fluent Cranial nerve II-XII: Pupils were equal round reactive to light. Extraocular movements were full, visual field were full on confrontational test.  Motor: The motor testing reveals 5 over 5 strength of all 4 extremities. Good symmetric motor tone is noted throughout.  Sensory: Sensory testing is intact to soft touch on all 4 extremities with exception of reduced sensation of bilateral great toes, laterally. No evidence of extinction is noted.  Gait and station: Gait is normal.   DIAGNOSTIC DATA (LABS, IMAGING, TESTING) - I reviewed patient records, labs, notes, testing and imaging myself where available.  No flowsheet data found.   Lab Results  Component Value Date   WBC 5.6 01/26/2018   HGB 12.6 01/26/2018   HCT 36.2 01/26/2018   MCV 88 01/26/2018   PLT 327 01/26/2018      Component Value Date/Time   NA 138 12/01/2018 0850   K 4.0 12/01/2018 0850   CL 98 12/01/2018 0850   CO2 22 12/01/2018 0850   GLUCOSE 141 (H) 12/01/2018 0850   GLUCOSE 99 03/26/2016 1125   BUN 8 12/01/2018 0850   CREATININE 0.68 12/01/2018 0850   CALCIUM 9.8 12/01/2018 0850   PROT 6.4 10/31/2018 1008   ALBUMIN 4.3 10/31/2018 1008   AST 66 (H) 10/31/2018 1008   ALT 49 (H) 10/31/2018 1008   ALKPHOS 62 10/31/2018 1008   BILITOT <0.2 10/31/2018 1008   GFRNONAA 103 12/01/2018 0850   GFRAA 119 12/01/2018 0850   Lab Results  Component Value Date   CHOL 240 (H) 08/29/2018   HDL 35 (L) 08/29/2018   LDLCALC 136 (H) 08/29/2018  TRIG 345 (H) 08/29/2018   CHOLHDL 6.9 (H) 08/29/2018   Lab Results  Component Value Date   HGBA1C 6.2 (H) 06/13/2018   Lab Results  Component Value Date   VITAMINB12 1,067 06/13/2018   Lab Results  Component Value Date   TSH 1.340 01/26/2018       ASSESSMENT AND PLAN 48 y.o. year old female  has a past  medical history of Allergy, Anxiety, History of migraine headaches, IBS (irritable bowel syndrome), and Impaired fasting glucose. here with     ICD-10-CM   1. B12 deficiency  E53.8   2. Prediabetes  R73.03   3. Peripheral polyneuropathy  G62.9   4. Elevated liver enzymes  R74.8     Aditri continues to suffer from neuropathic pain in bilateral great toes.  Fortunately, she has noted some benefit with B12 supplementation and gabapentin as prescribed.  We have had a lengthy discussion regarding recent diagnosis of fatty liver disease.  We have discussed how this relates to concerns of prediabetes and central obesity.  We have discussed the connection of prediabetes in polyneuropathy.  We may consider performing a nerve conduction study, however, I do not feel that this will provide any additional information at this time.  She was advised to work on lifestyle modifications with a healthy low-carb glycemic diet and regular exercise.  I have offered to refer her over to the healthy [facility] center.  She declines at this time.  She will continue omega-3 treatment as directed by PCP.  We will follow-up in 2 to 3 months to reassess neuropathy pain following initiation of healthy lifestyle habits.  She verbalizes understanding and agreement with this plan.   No orders of the defined types were placed in this encounter.    No orders of the defined types were placed in this encounter.     I spent 60 minutes with the patient. 50% of this time was spent counseling and educating patient on plan of care and medications.     Debbora Presto, FNP-C 12/18/2018, 3:05 PM [facility] [address] [place], [place] [address] [phone]  Made any corrections needed, and agree with history, physical, neuro exam,assessment and plan as stated.     Sarina Ill, MD [facility]

## 2018-12-18 NOTE — Patient Instructions (Signed)
Please work on healthy lifestyle habits with a healthy low carb/low glycemic diet and regular exercise.  Look into The Next 56 Days  Follow up in 3 months    Carbohydrate Counting for Diabetes Mellitus, Adult  Carbohydrate counting is a method of keeping track of how many carbohydrates you eat. Eating carbohydrates naturally increases the amount of sugar (glucose) in the blood. Counting how many carbohydrates you eat helps keep your blood glucose within normal limits, which helps you manage your diabetes (diabetes mellitus). It is important to know how many carbohydrates you can safely have in each meal. This is different for every person. A diet and nutrition specialist (registered dietitian) can help you make a meal plan and calculate how many carbohydrates you should have at each meal and snack. Carbohydrates are found in the following foods:  Grains, such as breads and cereals.  Dried beans and soy products.  Starchy vegetables, such as potatoes, peas, and corn.  Fruit and fruit juices.  Milk and yogurt.  Sweets and snack foods, such as cake, cookies, candy, chips, and soft drinks. How do I count carbohydrates? There are two ways to count carbohydrates in food. You can use either of the methods or a combination of both. Reading "Nutrition Facts" on packaged food The "Nutrition Facts" list is included on the labels of almost all packaged foods and beverages in the [place] It includes:  The serving size.  Information about nutrients in each serving, including the grams (g) of carbohydrate per serving. To use the Nutrition Facts":  Decide how many servings you will have.  Multiply the number of servings by the number of carbohydrates per serving.  The resulting number is the total amount of carbohydrates that you will be having. Learning standard serving sizes of other foods When you eat carbohydrate foods that are not packaged or do not include "Nutrition Facts" on the  label, you need to measure the servings in order to count the amount of carbohydrates:  Measure the foods that you will eat with a food scale or measuring cup, if needed.  Decide how many standard-size servings you will eat.  Multiply the number of servings by 15. Most carbohydrate-rich foods have about 15 g of carbohydrates per serving. ? For example, if you eat 8 oz (170 g) of strawberries, you will have eaten 2 servings and 30 g of carbohydrates (2 servings x 15 g = 30 g).  For foods that have more than one food mixed, such as soups and casseroles, you must count the carbohydrates in each food that is included. The following list contains standard serving sizes of common carbohydrate-rich foods. Each of these servings has about 15 g of carbohydrates:   hamburger bun or  English muffin.   oz (15 mL) syrup.   oz (14 g) jelly.  1 slice of bread.  1 six-inch tortilla.  3 oz (85 g) cooked rice or pasta.  4 oz (113 g) cooked dried beans.  4 oz (113 g) starchy vegetable, such as peas, corn, or potatoes.  4 oz (113 g) hot cereal.  4 oz (113 g) mashed potatoes or  of a large baked potato.  4 oz (113 g) canned or frozen fruit.  4 oz (120 mL) fruit juice.  4-6 crackers.  6 chicken nuggets.  6 oz (170 g) unsweetened dry cereal.  6 oz (170 g) plain fat-free yogurt or yogurt sweetened with artificial sweeteners.  8 oz (240 mL) milk.  8 oz (170 g) fresh  fruit or one small piece of fruit.  24 oz (680 g) popped popcorn. Example of carbohydrate counting Sample meal  3 oz (85 g) chicken breast.  6 oz (170 g) brown rice.  4 oz (113 g) corn.  8 oz (240 mL) milk.  8 oz (170 g) strawberries with sugar-free whipped topping. Carbohydrate calculation 1. Identify the foods that contain carbohydrates: ? Rice. ? Corn. ? Milk. ? Strawberries. 2. Calculate how many servings you have of each food: ? 2 servings rice. ? 1 serving corn. ? 1 serving milk. ? 1 serving  strawberries. 3. Multiply each number of servings by 15 g: ? 2 servings rice x 15 g = 30 g. ? 1 serving corn x 15 g = 15 g. ? 1 serving milk x 15 g = 15 g. ? 1 serving strawberries x 15 g = 15 g. 4. Add together all of the amounts to find the total grams of carbohydrates eaten: ? 30 g + 15 g + 15 g + 15 g = 75 g of carbohydrates total. Summary  Carbohydrate counting is a method of keeping track of how many carbohydrates you eat.  Eating carbohydrates naturally increases the amount of sugar (glucose) in the blood.  Counting how many carbohydrates you eat helps keep your blood glucose within normal limits, which helps you manage your diabetes.  A diet and nutrition specialist (registered dietitian) can help you make a meal plan and calculate how many carbohydrates you should have at each meal and snack. This information is not intended to replace advice given to you by your health care provider. Make sure you discuss any questions you have with your health care provider. Document Released: 02/08/2005 Document Revised: 09/02/2016 Document Reviewed: 07/23/2015 Elsevier Patient Education  2020 [employer] [employer] Calories are units of energy. Your body needs a certain amount of calories from food to keep you going throughout the day. When you eat more calories than your body needs, your body stores the extra calories as fat. When you eat fewer calories than your body needs, your body burns fat to get the energy it needs. Calorie counting means keeping track of how many calories you eat and drink each day. Calorie counting can be helpful if you need to lose weight. If you make sure to eat fewer calories than your body needs, you should lose weight. Ask your health care provider what a healthy weight is for you. For calorie counting to work, you will need to eat the right number of calories in a day in order to lose a healthy amount of weight per week. A dietitian can  help you determine how many calories you need in a day and will give you suggestions on how to reach your calorie goal.  A healthy amount of weight to lose per week is usually 1-2 lb (0.5-0.9 kg). This usually means that your daily calorie intake should be reduced by 500-750 calories.  Eating 1,200 - 1,500 calories per day can help most women lose weight.  Eating 1,500 - 1,800 calories per day can help most men lose weight. What is my plan? My goal is to have __________ calories per day. If I have this many calories per day, I should lose around __________ pounds per week. What do I need to know about calorie counting? In order to meet your daily calorie goal, you will need to:  Find out how many calories are in each food you would  like to eat. Try to do this before you eat.  Decide how much of the food you plan to eat.  Write down what you ate and how many calories it had. Doing this is called keeping a food log. To successfully lose weight, it is important to balance calorie counting with a healthy lifestyle that includes regular activity. Aim for 150 minutes of moderate exercise (such as walking) or 75 minutes of vigorous exercise (such as running) each week. Where do I find calorie information?  The number of calories in a food can be found on a Nutrition Facts label. If a food does not have a Nutrition Facts label, try to look up the calories online or ask your dietitian for help. Remember that calories are listed per serving. If you choose to have more than one serving of a food, you will have to multiply the calories per serving by the amount of servings you plan to eat. For example, the label on a package of bread might say that a serving size is 1 slice and that there are 90 calories in a serving. If you eat 1 slice, you will have eaten 90 calories. If you eat 2 slices, you will have eaten 180 calories. How do I keep a food log? Immediately after each meal, record the following  information in your food log:  What you ate. Don't forget to include toppings, sauces, and other extras on the food.  How much you ate. This can be measured in cups, ounces, or number of items.  How many calories each food and drink had.  The total number of calories in the meal. Keep your food log near you, such as in a small notebook in your pocket, or use a mobile app or website. Some programs will calculate calories for you and show you how many calories you have left for the day to meet your goal. What are some calorie counting tips?   Use your calories on foods and drinks that will fill you up and not leave you hungry: ? Some examples of foods that fill you up are nuts and nut butters, vegetables, lean proteins, and high-fiber foods like whole grains. High-fiber foods are foods with more than 5 g fiber per serving. ? Drinks such as sodas, specialty coffee drinks, alcohol, and juices have a lot of calories, yet do not fill you up.  Eat nutritious foods and avoid empty calories. Empty calories are calories you get from foods or beverages that do not have many vitamins or protein, such as candy, sweets, and soda. It is better to have a nutritious high-calorie food (such as an avocado) than a food with few nutrients (such as a bag of chips).  Know how many calories are in the foods you eat most often. This will help you calculate calorie counts faster.  Pay attention to calories in drinks. Low-calorie drinks include water and unsweetened drinks.  Pay attention to nutrition labels for "low fat" or "fat free" foods. These foods sometimes have the same amount of calories or more calories than the full fat versions. They also often have added sugar, starch, or salt, to make up for flavor that was removed with the fat.  Find a way of tracking calories that works for you. Get creative. Try different apps or programs if writing down calories does not work for you. What are some portion control  tips?  Know how many calories are in a serving. This will help you know how  many servings of a certain food you can have.  Use a measuring cup to measure serving sizes. You could also try weighing out portions on a kitchen scale. With time, you will be able to estimate serving sizes for some foods.  Take some time to put servings of different foods on your favorite plates, bowls, and cups so you know what a serving looks like.  Try not to eat straight from a bag or box. Doing this can lead to overeating. Put the amount you would like to eat in a cup or on a plate to make sure you are eating the right portion.  Use smaller plates, glasses, and bowls to prevent overeating.  Try not to multitask (for example, watch TV or use your computer) while eating. If it is time to eat, sit down at a table and enjoy your food. This will help you to know when you are full. It will also help you to be aware of what you are eating and how much you are eating. What are tips for following this plan? Reading food labels  Check the calorie count compared to the serving size. The serving size may be smaller than what you are used to eating.  Check the source of the calories. Make sure the food you are eating is high in vitamins and protein and low in saturated and trans fats. Shopping  Read nutrition labels while you shop. This will help you make healthy decisions before you decide to purchase your food.  Make a grocery list and stick to it. Cooking  Try to cook your favorite foods in a healthier way. For example, try baking instead of frying.  Use low-fat dairy products. Meal planning  Use more fruits and vegetables. Half of your plate should be fruits and vegetables.  Include lean proteins like poultry and fish. How do I count calories when eating out?  Ask for smaller portion sizes.  Consider sharing an entree and sides instead of getting your own entree.  If you get your own entree, eat only  half. Ask for a box at the beginning of your meal and put the rest of your entree in it so you are not tempted to eat it.  If calories are listed on the menu, choose the lower calorie options.  Choose dishes that include vegetables, fruits, whole grains, low-fat dairy products, and lean protein.  Choose items that are boiled, broiled, grilled, or steamed. Stay away from items that are buttered, battered, fried, or served with cream sauce. Items labeled "crispy" are usually fried, unless stated otherwise.  Choose water, low-fat milk, unsweetened iced tea, or other drinks without added sugar. If you want an alcoholic beverage, choose a lower calorie option such as a glass of wine or light beer.  Ask for dressings, sauces, and syrups on the side. These are usually high in calories, so you should limit the amount you eat.  If you want a salad, choose a garden salad and ask for grilled meats. Avoid extra toppings like bacon, cheese, or fried items. Ask for the dressing on the side, or ask for olive oil and vinegar or lemon to use as dressing.  Estimate how many servings of a food you are given. For example, a serving of cooked rice is  cup or about the size of half a baseball. Knowing serving sizes will help you be aware of how much food you are eating at restaurants. The list below tells you how big or small  some common portion sizes are based on everyday objects: ? 1 oz--4 stacked dice. ? 3 oz--1 deck of cards. ? 1 tsp--1 die. ? 1 Tbsp-- a ping-pong ball. ? 2 Tbsp--1 ping-pong ball. ?  cup-- baseball. ? 1 cup--1 baseball. Summary  Calorie counting means keeping track of how many calories you eat and drink each day. If you eat fewer calories than your body needs, you should lose weight.  A healthy amount of weight to lose per week is usually 1-2 lb (0.5-0.9 kg). This usually means reducing your daily calorie intake by 500-750 calories.  The number of calories in a food can be found on a  Nutrition Facts label. If a food does not have a Nutrition Facts label, try to look up the calories online or ask your dietitian for help.  Use your calories on foods and drinks that will fill you up, and not on foods and drinks that will leave you hungry.  Use smaller plates, glasses, and bowls to prevent overeating. This information is not intended to replace advice given to you by your health care provider. Make sure you discuss any questions you have with your health care provider. Document Released: 02/08/2005 Document Revised: 10/28/2017 Document Reviewed: 01/09/2016 Elsevier Patient Education  2020 [place].    Fatty Liver Disease  Fatty liver disease occurs when too much fat has built up in your liver cells. Fatty liver disease is also called hepatic steatosis or steatohepatitis. The liver removes harmful substances from your bloodstream and produces fluids that your body needs. It also helps your body use and store energy from the food you eat. In many cases, fatty liver disease does not cause symptoms or problems. It is often diagnosed when tests are being done for other reasons. However, over time, fatty liver can cause inflammation that may lead to more serious liver problems, such as scarring of the liver (cirrhosis) and liver failure. Fatty liver is associated with insulin resistance, increased body fat, high blood pressure (hypertension), and high cholesterol. These are features of metabolic syndrome and increase your risk for stroke, diabetes, and heart disease. What are the causes? This condition may be caused by:  Drinking too much alcohol.  Poor nutrition.  Obesity.  Cushing's syndrome.  Diabetes.  High cholesterol.  Certain drugs.  Poisons.  Some viral infections.  Pregnancy. What increases the risk? You are more likely to develop this condition if you:  Abuse alcohol.  Are overweight.  Have diabetes.  Have hepatitis.  Have a high triglyceride  level.  Are pregnant. What are the signs or symptoms? Fatty liver disease often does not cause symptoms. If symptoms do develop, they can include:  Fatigue.  Weakness.  Weight loss.  Confusion.  Abdominal pain.  Nausea and vomiting.  Yellowing of your skin and the white parts of your eyes (jaundice).  Itchy skin. How is this diagnosed? This condition may be diagnosed by:  A physical exam and medical history.  Blood tests.  Imaging tests, such as an ultrasound, CT scan, or MRI.  A liver biopsy. A small sample of liver tissue is removed using a needle. The sample is then looked at under a microscope. How is this treated? Fatty liver disease is often caused by other health conditions. Treatment for fatty liver may involve medicines and lifestyle changes to manage conditions such as:  Alcoholism.  High cholesterol.  Diabetes.  Being overweight or obese. Follow these instructions at home:   Do not drink alcohol. If you  have trouble quitting, ask your health care provider how to safely quit with the help of medicine or a supervised program. This is important to keep your condition from getting worse.  Eat a healthy diet as told by your health care provider. Ask your health care provider about working with a diet and nutrition specialist (dietitian) to develop an eating plan.  Exercise regularly. This can help you lose weight and control your cholesterol and diabetes. Talk to your health care provider about an exercise plan and which activities are best for you.  Take over-the-counter and prescription medicines only as told by your health care provider.  Keep all follow-up visits as told by your health care provider. This is important. Contact a health care provider if: You have trouble controlling your:  Blood sugar. This is especially important if you have diabetes.  Cholesterol.  Drinking of alcohol. Get help right away if:  You have abdominal pain.  You  have jaundice.  You have nausea and vomiting.  You vomit blood or material that looks like coffee grounds.  You have stools that are black, tar-like, or bloody. Summary  Fatty liver disease develops when too much fat builds up in the cells of your liver.  Fatty liver disease often causes no symptoms or problems. However, over time, fatty liver can cause inflammation that may lead to more serious liver problems, such as scarring of the liver (cirrhosis).  You are more likely to develop this condition if you abuse alcohol, are pregnant, are overweight, have diabetes, have hepatitis, or have high triglyceride levels.  Contact your health care provider if you have trouble controlling your weight, blood sugar, cholesterol, or drinking of alcohol. This information is not intended to replace advice given to you by your health care provider. Make sure you discuss any questions you have with your health care provider. Document Released: 03/26/2005 Document Revised: 01/21/2017 Document Reviewed: 11/17/2016 Elsevier Patient Education  2020 [employer].

## 2018-12-19 ENCOUNTER — Encounter: Payer: Self-pay | Admitting: Nurse Practitioner

## 2018-12-26 ENCOUNTER — Telehealth: Payer: Self-pay | Admitting: Nurse Practitioner

## 2018-12-26 ENCOUNTER — Encounter: Payer: Self-pay | Admitting: Nurse Practitioner

## 2018-12-26 DIAGNOSIS — Z1329 Encounter for screening for other suspected endocrine disorder: Secondary | ICD-10-CM

## 2018-12-26 DIAGNOSIS — Z79899 Other long term (current) drug therapy: Secondary | ICD-10-CM

## 2018-12-26 DIAGNOSIS — E559 Vitamin D deficiency, unspecified: Secondary | ICD-10-CM

## 2018-12-26 DIAGNOSIS — R7303 Prediabetes: Secondary | ICD-10-CM

## 2018-12-26 DIAGNOSIS — R748 Abnormal levels of other serum enzymes: Secondary | ICD-10-CM

## 2018-12-26 DIAGNOSIS — I10 Essential (primary) hypertension: Secondary | ICD-10-CM

## 2018-12-26 NOTE — Telephone Encounter (Signed)
Has an appt on December 11th with [name] and wants to have labwork ordered for [facility].  [name] she wants "the works".

## 2018-12-26 NOTE — Telephone Encounter (Signed)
Last labs completed 12/01/2018 BMET; 10/31/2018 Hepatic; 10/23/2018 Ferritin, Hep C Antibody, Hep B Surface Antigen; 08/29/2018 Salivary Cortisol, Vit D, Lip and Hepatic. Please advise. Thank you

## 2018-12-28 ENCOUNTER — Ambulatory Visit: Payer: 59 | Admitting: "Endocrinology

## 2018-12-29 ENCOUNTER — Other Ambulatory Visit: Payer: Self-pay | Admitting: Nurse Practitioner

## 2018-12-29 DIAGNOSIS — R748 Abnormal levels of other serum enzymes: Secondary | ICD-10-CM

## 2018-12-29 NOTE — Telephone Encounter (Signed)
Please order CMP, Lipid, Liver, A1C, vitamin B12, TSH and vitamin D. Thanks.  Diagnosis for vitamin B12 (elevated B12 level)

## 2018-12-29 NOTE — Telephone Encounter (Signed)
Contacted patient regarding lab orders.pt advised that we do not have any glucose machines in office. Pt verbalized understanding

## 2018-12-29 NOTE — Telephone Encounter (Signed)
Pt contacted and verbalized understanding.  

## 2018-12-29 NOTE — Progress Notes (Signed)
error 

## 2019-01-01 MED FILL — LIDOCAINE-PRILOCAINE CREAM: 2.5-2.5 | 15 days supply | Qty: 30 | Fill #2

## 2019-01-02 ENCOUNTER — Encounter: Payer: Self-pay | Admitting: Family Medicine

## 2019-01-05 DIAGNOSIS — H52223 Regular astigmatism, bilateral: Secondary | ICD-10-CM | POA: Diagnosis not present

## 2019-01-05 DIAGNOSIS — H5213 Myopia, bilateral: Secondary | ICD-10-CM | POA: Diagnosis not present

## 2019-01-08 MED FILL — RESTASIS 0.05% EYE EMULSION: 0.05 | 90 days supply | Qty: 180 | Fill #0

## 2019-01-11 ENCOUNTER — Other Ambulatory Visit: Payer: Self-pay | Admitting: Neurology

## 2019-01-11 ENCOUNTER — Other Ambulatory Visit: Payer: Self-pay | Admitting: Family Medicine

## 2019-01-11 MED FILL — ALPRAZolam 1 MG TABS: 1 | 30 days supply | Qty: 60 | Fill #0

## 2019-01-11 MED FILL — FLUCONAZOLE 150 MG TABLET: 150 | 3 days supply | Qty: 2 | Fill #0

## 2019-01-11 MED FILL — ESZOPICLONE 3 MG TABS: 3 | 30 days supply | Qty: 30 | Fill #5

## 2019-01-11 MED FILL — OMEGA-3 ETHYL ESTERS 1 GM C: 1 | 30 days supply | Qty: 120 | Fill #1

## 2019-01-12 ENCOUNTER — Other Ambulatory Visit: Payer: Self-pay | Admitting: Neurology

## 2019-01-12 MED ORDER — GABAPENTIN 600 MG PO TABS: ORAL_TABLET | ORAL | 3 refills | Status: DC

## 2019-01-17 ENCOUNTER — Encounter: Payer: Self-pay | Admitting: Family Medicine

## 2019-01-17 ENCOUNTER — Other Ambulatory Visit: Payer: Self-pay | Admitting: Family Medicine

## 2019-01-17 ENCOUNTER — Telehealth: Payer: Self-pay | Admitting: Neurology

## 2019-01-17 MED FILL — CYCLOBENZAPRINE HCL 10 MG T: 10 | 90 days supply | Qty: 90 | Fill #0

## 2019-01-17 NOTE — Telephone Encounter (Signed)
I sent an mychart message to her with [facility], Dr. Rexene Alberts recommendation.

## 2019-01-17 NOTE — Telephone Encounter (Signed)
Pt is taking gabapentin 600mg  po QID.  Please advise. Amy NP and Dr. Jaynee Eagles out.

## 2019-01-17 NOTE — Telephone Encounter (Signed)
I spoke with the patient for about 15 minutes ad we had a nice conversation. We went over her medications and updated the list. She stated she had not requested the refills of the flexeril and she will call her PCP. Her questions were answered during the call. She was advised of Dr. Guadelupe Sabin recommendation to further discuss the medications with Dr. Jaynee Eagles upon her return. The pt is aware that she can have a sooner appt if needed. She also was told the provider may choose to discuss all of this at her office visit vs on the phone and the patient was fine with that. She verbalized appreciation for the call.

## 2019-01-17 NOTE — Telephone Encounter (Addendum)
I called the pt and she answered. She must have not been able to hear me speaking because she hung up. I called the pt again and then it went to vm. I LVM asking for a call back. Left office number in message.

## 2019-01-17 NOTE — Telephone Encounter (Signed)
Patient returned the call and I discussed Dr. Guadelupe Sabin message. The patient was upset and stated she is no longer taking Flexeril or Zoloft. She said she didn't want to "write a book" but she had wanted to know if the increase in pain is normal because she had been trying to do things to help lately like wearing compression socks, walking more, etc. The numbing cream didn't help her increased pain last night. She declined to make a sooner appt with Amy and will leave the 12/15 appt as is. The patient would like this bounced back to the work-in doctor to address her questions and reevaluate.

## 2019-01-17 NOTE — Telephone Encounter (Signed)
I am sorry for the misunderstanding, I was going by the medication list on file. The prescription for Flexeril has Today's date on it, The sertraline prescription for 150 mg daily is from September. She may want to talk to the prescriber about discontinuation. I still do not recommend that she increase her gabapentin at this moment, hopefully, Dr. Jaynee Eagles can review her medication management next week. Please call patient back as requested.

## 2019-01-17 NOTE — Addendum Note (Signed)
Addended by: Gildardo Griffes on: 01/17/2019 12:40 PM   Modules accepted: Orders

## 2019-01-17 NOTE — Telephone Encounter (Signed)
Please call patient back, I would not recommend that she increase gabapentin at this time as she is already on a higher dose and on other potentially sedating medications, Including amitriptyline, sertraline, Flexeril, and Xanax.  I would favor that she discuss Medication management with Dr. Jaynee Eagles upon her return next week.

## 2019-01-19 ENCOUNTER — Other Ambulatory Visit (HOSPITAL_COMMUNITY)
Admission: RE | Admit: 2019-01-19 | Discharge: 2019-01-19 | Disposition: A | Discharge status: Home / Self Care | Payer: 59 | Source: Ambulatory Visit | Attending: Nurse Practitioner | Admitting: Nurse Practitioner

## 2019-01-19 DIAGNOSIS — R748 Abnormal levels of other serum enzymes: Secondary | ICD-10-CM | POA: Insufficient documentation

## 2019-01-19 LAB — COMPREHENSIVE METABOLIC PANEL
ALT: 40 U/L (ref 0–44)
AST: 52 U/L — ABNORMAL HIGH (ref 15–41)
Albumin: 4 g/dL (ref 3.5–5.0)
Alkaline Phosphatase: 55 U/L (ref 38–126)
Anion gap: 12 (ref 5–15)
BUN: 8 mg/dL (ref 6–20)
CO2: 23 mmol/L (ref 22–32)
Calcium: 8.9 mg/dL (ref 8.9–10.3)
Chloride: 102 mmol/L (ref 98–111)
Creatinine, Ser: 0.46 mg/dL (ref 0.44–1.00)
GFR calc Af Amer: >60 mL/min (ref >60)
GFR calc non Af Amer: >60 mL/min (ref >60)
Glucose, Bld: 131 mg/dL — ABNORMAL HIGH (ref 70–99)
Potassium: 3.9 mmol/L (ref 3.5–5.1)
Sodium: 137 mmol/L (ref 135–145)
Total Bilirubin: 0.7 mg/dL (ref 0.3–1.2)
Total Protein: 7.3 g/dL (ref 6.5–8.1)

## 2019-01-19 LAB — LIPID PANEL
Cholesterol: 238 mg/dL — ABNORMAL HIGH (ref 0–200)
HDL: 43 mg/dL (ref >40)
LDL Cholesterol: 156 mg/dL — ABNORMAL HIGH (ref 0–99)
Total CHOL/HDL Ratio: 5.5 RATIO
Triglycerides: 197 mg/dL — ABNORMAL HIGH (ref <150)
VLDL: 39 mg/dL (ref 0–40)

## 2019-01-19 LAB — TSH: TSH: 1.575 u[IU]/mL (ref 0.350–4.500)

## 2019-01-19 LAB — HEMOGLOBIN A1C
Hgb A1c MFr Bld: 6.8 % — ABNORMAL HIGH (ref 4.8–5.6)
Mean Plasma Glucose: 148.46 mg/dL

## 2019-01-19 LAB — VITAMIN D 25 HYDROXY (VIT D DEFICIENCY, FRACTURES): Vit D, 25-Hydroxy: 16.78 ng/mL — ABNORMAL LOW (ref 30–100)

## 2019-01-19 LAB — VITAMIN B12: Vitamin B-12: 450 pg/mL (ref 180–914)

## 2019-01-20 ENCOUNTER — Encounter: Payer: Self-pay | Admitting: Nurse Practitioner

## 2019-01-20 ENCOUNTER — Other Ambulatory Visit: Payer: Self-pay | Admitting: Nurse Practitioner

## 2019-01-20 MED ORDER — VITAMIN D (ERGOCALCIFEROL) 1.25 MG (50000 UNIT) PO CAPS: 50000.0000 [IU] | ORAL_CAPSULE | ORAL | 0 refills | Status: DC

## 2019-01-22 MED FILL — VIT D2 1.25 MG (50,000 UNIT: 1.25 MG | 84 days supply | Qty: 12 | Fill #0

## 2019-01-24 ENCOUNTER — Encounter: Payer: Self-pay | Admitting: Nurse Practitioner

## 2019-01-24 LAB — VITAMIN B1: Vitamin B1 (Thiamine): 98.6 nmol/L (ref 66.5–200.0)

## 2019-01-26 ENCOUNTER — Other Ambulatory Visit: Payer: Self-pay | Admitting: Nurse Practitioner

## 2019-01-26 DIAGNOSIS — E785 Hyperlipidemia, unspecified: Secondary | ICD-10-CM

## 2019-01-26 DIAGNOSIS — Z79899 Other long term (current) drug therapy: Secondary | ICD-10-CM

## 2019-01-26 DIAGNOSIS — E781 Pure hyperglyceridemia: Secondary | ICD-10-CM

## 2019-01-26 MED ORDER — ROSUVASTATIN CALCIUM 5 MG PO TABS: 5.0000 mg | ORAL_TABLET | Freq: Every day | ORAL | 0 refills | Status: DC

## 2019-01-26 MED FILL — ROSUVASTATIN CALCIUM 5 MG T: 5 | 90 days supply | Qty: 90 | Fill #0

## 2019-01-29 MED FILL — AMITRIPTYLINE HCL 50 MG TAB: 50 | 90 days supply | Qty: 90 | Fill #2

## 2019-02-01 ENCOUNTER — Encounter: Payer: Self-pay | Admitting: Family Medicine

## 2019-02-01 ENCOUNTER — Encounter: Payer: Self-pay | Admitting: Nurse Practitioner

## 2019-02-02 ENCOUNTER — Ambulatory Visit (INDEPENDENT_AMBULATORY_CARE_PROVIDER_SITE_OTHER): Payer: 59 | Admitting: Nurse Practitioner

## 2019-02-02 ENCOUNTER — Other Ambulatory Visit: Payer: Self-pay

## 2019-02-02 ENCOUNTER — Encounter: Payer: Self-pay | Admitting: Nurse Practitioner

## 2019-02-02 DIAGNOSIS — E785 Hyperlipidemia, unspecified: Secondary | ICD-10-CM

## 2019-02-02 DIAGNOSIS — E1142 Type 2 diabetes mellitus with diabetic polyneuropathy: Secondary | ICD-10-CM

## 2019-02-02 DIAGNOSIS — L719 Rosacea, unspecified: Secondary | ICD-10-CM

## 2019-02-02 DIAGNOSIS — E781 Pure hyperglyceridemia: Secondary | ICD-10-CM | POA: Diagnosis not present

## 2019-02-02 DIAGNOSIS — G629 Polyneuropathy, unspecified: Secondary | ICD-10-CM

## 2019-02-02 DIAGNOSIS — E559 Vitamin D deficiency, unspecified: Secondary | ICD-10-CM | POA: Diagnosis not present

## 2019-02-02 MED ORDER — VITAMIN D (ERGOCALCIFEROL) 1.25 MG (50000 UNIT) PO CAPS: 50000.0000 [IU] | ORAL_CAPSULE | ORAL | 0 refills | Status: DC

## 2019-02-02 MED ORDER — METRONIDAZOLE 0.75 % EX CREA: TOPICAL_CREAM | Freq: Two times a day (BID) | CUTANEOUS | 2 refills | Status: DC

## 2019-02-02 MED FILL — metroNIDAZOLE 0.75 % CREA: 0.75 | 30 days supply | Qty: 45 | Fill #0

## 2019-02-02 NOTE — Progress Notes (Signed)
VIRTUAL VISIT Subjective:    Patient ID: [name], female    DOB: [birth year], 48 y.o.   MRN: [id number]  HPIFollow up on labwork.   Follow up on message about her feet. States she sent Tamilyn Lupien a message and she already knows what is going on.   Was taking minocycline but stopped because it was causing yeast infection and pharm told her she could get a cream instead.   Virtual Visit via Video Note  I connected with [name] on 02/02/19 at 11:20 AM EST by a video enabled telemedicine application and verified that I am speaking with the correct person using two identifiers.  Location: Patient: home Provider: office   I discussed the limitations of evaluation and management by telemedicine and the availability of in person appointments. The patient expressed understanding and agreed to proceed.  History of Present Illness: Presents by video to discuss the neuropathic symptoms in her feet. Has has regular follow up with neurology (see note from October). States her feet feel cold to the touch. Warmth such as a heater or warm water helps her symptoms temporarily. Pain is worse on some days. Adherent to Gabapentin. Would like to increase to one extra pill per day when her pain is severe. NCV was cancelled based on consultation by neurology and podiatry since pain is in both feet. Slight purplish color is noted in both feet with prolonged sitting. Flushed after exposing to heat. Continues to get B12 injections which seems to help. Works with a vascular specialist. It has been suggested that she may have Raynaud syndrome.  According to patient her VS today: 202 lbs; BP 130/90 and pulse 105. Would also like to switch to a topical agent for her acne rosacea.  Has started her statin for elevated LDL.  Does not check her BS on a regular basis.   Observations/Objective: Format  Patient present at home Provider present at office Consent for interaction obtained Coronavirus outbreak made  virtual visit necessary  On video, her toes are pale. Her husband pressed on her feet and toes with normal cap refill noted.  Patient is alert, oriented, Calm affect.  Recent Results (from the past 2160 hour(s))  Basic Metabolic Panel (BMET)     Status: Abnormal   Collection Time: 12/01/18  8:50 AM  Result Value Ref Range   Glucose 141 (H) 65 - 99 mg/dL   BUN 8 6 - 24 mg/dL   Creatinine, Ser 0.68 0.57 - 1.00 mg/dL   GFR calc non Af Amer 103 >59 mL/min/1.73   GFR calc Af Amer 119 >59 mL/min/1.73   BUN/Creatinine Ratio 12 9 - 23   Sodium 138 134 - 144 mmol/L   Potassium 4.0 3.5 - 5.2 mmol/L   Chloride 98 96 - 106 mmol/L   CO2 22 20 - 29 mmol/L   Calcium 9.8 8.7 - 10.2 mg/dL  Lipid panel     Status: Abnormal   Collection Time: 01/19/19  8:43 AM  Result Value Ref Range   Cholesterol 238 (H) 0 - 200 mg/dL   Triglycerides 197 (H) <150 mg/dL   HDL 43 >40 mg/dL   Total CHOL/HDL Ratio 5.5 RATIO   VLDL 39 0 - 40 mg/dL   LDL Cholesterol 156 (H) 0 - 99 mg/dL    Comment:        Total Cholesterol/HDL:CHD Risk Coronary Heart Disease Risk Table  Men   Women  1/2 Average Risk   3.4   3.3  Average Risk       5.0   4.4  2 X Average Risk   9.6   7.1  3 X Average Risk  23.4   11.0        Use the calculated Patient Ratio above and the CHD Risk Table to determine the patient's CHD Risk.        ATP III CLASSIFICATION (LDL):  <100     mg/dL   Optimal  100-129  mg/dL   Near or Above                    Optimal  130-159  mg/dL   Borderline  160-189  mg/dL   High  >190     mg/dL   Very High Performed at [facility] [place], [place] [address]   Hemoglobin A1c     Status: Abnormal   Collection Time: 01/19/19  8:43 AM  Result Value Ref Range   Hgb A1c MFr Bld 6.8 (H) 4.8 - 5.6 %    Comment: (NOTE) Pre diabetes:          5.7%-6.4% Diabetes:              >6.4% Glycemic control for   <7.0% adults with diabetes    Mean Plasma Glucose 148.46 mg/dL     Comment: Performed at [facility] [address] [place], [place] [address]  Vitamin B1     Status: None   Collection Time: 01/19/19  8:43 AM  Result Value Ref Range   Vitamin B1 (Thiamine) 98.6 66.5 - 200.0 nmol/L    Comment: (NOTE) This test was developed and its performance characteristics determined by [facility]. It has not been cleared or approved by the Food and Drug Administration. Performed At: [facility] [place], [place] [id number] Rush Farmer MD [id number]   VITAMIN D 25 Hydroxy (Vit-D Deficiency, Fractures)     Status: Abnormal   Collection Time: 01/19/19  8:43 AM  Result Value Ref Range   Vit D, 25-Hydroxy 16.78 (L) 30 - 100 ng/mL    Comment: (NOTE) Vitamin D deficiency has been defined by the [place] practice guideline as a level of serum 25-OH  vitamin D less than 20 ng/mL (1,2). The Endocrine Society went on to  further define vitamin D insufficiency as a level between 21 and 29  ng/mL (2). 1. IOM (Institute of Medicine). 2010. Dietary reference intakes for  calcium and D. [place]: The [employer]. 2. Holick MF, [place], Bischoff-Ferrari HA, et al. Evaluation,  treatment, and prevention of vitamin D deficiency: an Endocrine  Society clinical practice guideline, JCEM. 2011 Jul; 96(7): 1911-30. Performed at [facility], [place] [address] [place], [place] [address]   Comprehensive metabolic panel     Status: Abnormal   Collection Time: 01/19/19  8:43 AM  Result Value Ref Range   Sodium 137 135 - 145 mmol/L   Potassium 3.9 3.5 - 5.1 mmol/L   Chloride 102 98 - 111 mmol/L   CO2 23 22 - 32 mmol/L   Glucose, Bld 131 (H) 70 - 99 mg/dL   BUN 8 6 - 20 mg/dL   Creatinine, Ser 0.46 0.44 - 1.00 mg/dL   Calcium 8.9 8.9 - 10.3 mg/dL   Total Protein 7.3 6.5 - 8.1 g/dL   Albumin  4.0 3.5 - 5.0 g/dL   AST 52 (H) 15 - 41 U/L   ALT 40 0 - 44 U/L   Alkaline Phosphatase 55 38  - 126 U/L   Total Bilirubin 0.7 0.3 - 1.2 mg/dL   GFR calc non Af Amer >60 >60 mL/min   GFR calc Af Amer >60 >60 mL/min   Anion gap 12 5 - 15    Comment: Performed at [facility], [address], [place], [place] [address]  TSH     Status: None   Collection Time: 01/19/19  8:44 AM  Result Value Ref Range   TSH 1.575 0.350 - 4.500 uIU/mL    Comment: Performed by a 3rd Generation assay with a functional sensitivity of <=0.01 uIU/mL. Performed at [facility], [address], [place], [place] [address]   Vitamin B12     Status: None   Collection Time: 01/19/19  8:44 AM  Result Value Ref Range   Vitamin B-12 450 180 - 914 pg/mL    Comment: (NOTE) This assay is not validated for testing neonatal or myeloproliferative syndrome specimens for Vitamin B12 levels. Performed at [facility], [address] [place], [place] [address]      Assessment and Plan: Problem List Items Addressed This Visit      Endocrine   Type 2 diabetes mellitus with diabetic polyneuropathy, without long-term current use of insulin ([place]) - Primary     Nervous and Auditory   Peripheral polyneuropathy     Musculoskeletal and Integument   Acne rosacea     Other   Hyperlipidemia   Hypertriglyceridemia   Vitamin D deficiency       Follow Up Instructions: 1. Restart Omega 3 and continue for TG. Repeat labs in 6-8 weeks after starting Crestor 5 mg. 2. Emphasized importance of activity, weight loss and reducing sugar and simple carbs in her diet. Check sugar a few times per week and record results.  3. A note was sent to patient after the visit to increase her Gabapentin by one tab per day as needed for severe pain.  4. Stop oral antibiotic and switch to Metrocream as directed.  5. Take Rx Vitamin D as directed. Return in about 3 months (around 05/03/2019). Call back sooner if needed.    I discussed the assessment and treatment plan with the patient. The patient was provided an opportunity to ask questions  and all were answered. The patient agreed with the plan and demonstrated an understanding of the instructions.   The patient was advised to call back or seek an in-person evaluation if the symptoms worsen or if the condition fails to improve as anticipated.  I provided 40 minutes of non-face-to-face time during this encounter.  40 minutes was spent with the patient.  This statement verifies that 40 minutes was indeed spent with the patient.  More than 50% of this visit-total duration of the visit-was spent in counseling and coordination of care. The issues that the patient came in for today as reflected in the diagnosis (s) please refer to documentation for further details.       Review of Systems     Objective:   Physical Exam        Assessment & Plan:

## 2019-02-03 ENCOUNTER — Encounter: Payer: Self-pay | Admitting: Nurse Practitioner

## 2019-02-03 DIAGNOSIS — E1142 Type 2 diabetes mellitus with diabetic polyneuropathy: Secondary | ICD-10-CM | POA: Insufficient documentation

## 2019-02-03 DIAGNOSIS — E119 Type 2 diabetes mellitus without complications: Secondary | ICD-10-CM | POA: Insufficient documentation

## 2019-02-03 DIAGNOSIS — E785 Hyperlipidemia, unspecified: Secondary | ICD-10-CM | POA: Insufficient documentation

## 2019-02-03 DIAGNOSIS — E782 Mixed hyperlipidemia: Secondary | ICD-10-CM | POA: Insufficient documentation

## 2019-02-06 ENCOUNTER — Ambulatory Visit: Payer: 59 | Admitting: Family Medicine

## 2019-02-07 ENCOUNTER — Other Ambulatory Visit: Payer: Self-pay | Admitting: Nurse Practitioner

## 2019-02-07 ENCOUNTER — Encounter: Payer: Self-pay | Admitting: Family Medicine

## 2019-02-07 ENCOUNTER — Ambulatory Visit (INDEPENDENT_AMBULATORY_CARE_PROVIDER_SITE_OTHER): Payer: 59 | Admitting: Family Medicine

## 2019-02-07 DIAGNOSIS — U071 COVID-19: Secondary | ICD-10-CM | POA: Diagnosis not present

## 2019-02-07 MED ORDER — HYDROCODONE-HOMATROPINE 5-1.5 MG/5ML PO SYRP: ORAL_SOLUTION | ORAL | 0 refills | Status: DC

## 2019-02-07 NOTE — Progress Notes (Signed)
   Subjective:    Patient ID: [name], female    DOB: Jul 31, 1970, 48 y.o.   MRN: [id number]  HPIpt positive for covid 19. Test was done on Monday and got results today. Pt states she was not feeling good Sunday and went to work on Monday and was sent to be tested. Was having sinus symptoms. States head and nose is stopped up. Chills and temp was 100.4 yesterday but normal today.   Pt states her low back has been hurting for 2 days.   Felt like something heavy on her chest last night. Pain between shoulder blades and lower back yesterday and today.   Virtual Visit via Telephone Note  I connected with [name] on 02/07/19 at  3:50 PM EST by telephone and verified that I am speaking with the correct person using two identifiers.  Location: Patient: home Provider: office   I discussed the limitations, risks, security and privacy concerns of performing an evaluation and management service by telephone and the availability of in person appointments. I also discussed with the patient that there may be a patient responsible charge related to this service. The patient expressed understanding and agreed to proceed.   History of Present Illness:    Observations/Objective:   Assessment and Plan:   Follow Up Instructions:    I discussed the assessment and treatment plan with the patient. The patient was provided an opportunity to ask questions and all were answered. The patient agreed with the plan and demonstrated an understanding of the instructions.   The patient was advised to call back or seek an in-person evaluation if the symptoms worsen or if the condition fails to improve as anticipated.  I provided 18 minutes of non-face-to-face time during this encounter.  Sun hit with sinus cong  Had Art therapist not feeling good  Pt had pos test  Gabapentin  yest had 100.4 tmax   Chest symoms ibin the chest  Review of Systems No high fevers no excessive shortness of breath no  change in bowel habits    Objective:   Physical Exam  Virtual      Assessment & Plan:  Impression COVID-19 infection.  Supportive care discussed.  Warning signs discussed.  Quarantine measures discussed.  General questions answered.  Hycodan as needed for cough and body ache

## 2019-02-09 ENCOUNTER — Other Ambulatory Visit: Payer: Self-pay | Admitting: Nurse Practitioner

## 2019-02-09 ENCOUNTER — Telehealth: Payer: Self-pay | Admitting: Nurse Practitioner

## 2019-02-09 MED ORDER — ALPRAZOLAM 1 MG PO TABS: 1.0000 mg | ORAL_TABLET | Freq: Two times a day (BID) | ORAL | 5 refills | Status: DC | PRN

## 2019-02-09 MED FILL — ALPRAZolam 1 MG TABS: 1 | 30 days supply | Qty: 60 | Fill #0

## 2019-02-09 MED FILL — GABAPENTIN 600 MG TABLET: 600 | 90 days supply | Qty: 360 | Fill #0

## 2019-02-09 MED FILL — metFORMIN HCL 1000 MG TABS: 1000 | 90 days supply | Qty: 180 | Fill #0

## 2019-02-09 MED FILL — ESZOPICLONE 3 MG TABS: 3 | 30 days supply | Qty: 30 | Fill #0

## 2019-02-09 NOTE — Telephone Encounter (Signed)
Pharmacy requesting refill on Alprazolam 1 mg tablet. Take one tablet po BID prn anxiety.  [place]

## 2019-02-09 NOTE — Telephone Encounter (Signed)
Done

## 2019-02-10 MED FILL — OMEGA-3 ETHYL ESTERS 1 GM C: 1 | 30 days supply | Qty: 120 | Fill #2

## 2019-02-12 ENCOUNTER — Encounter: Payer: Self-pay | Admitting: Family Medicine

## 2019-02-12 MED FILL — PANTOPRAZOLE SOD DR 40 MG T: 40 | 90 days supply | Qty: 90 | Fill #0

## 2019-02-13 MED ORDER — HYDROCODONE-HOMATROPINE 5-1.5 MG/5ML PO SYRP: ORAL_SOLUTION | ORAL | 0 refills | Status: DC

## 2019-02-13 MED ORDER — AMOXICILLIN-POT CLAVULANATE 875-125 MG PO TABS: ORAL_TABLET | ORAL | 0 refills | Status: DC

## 2019-02-19 ENCOUNTER — Other Ambulatory Visit: Payer: Self-pay | Admitting: Family Medicine

## 2019-02-19 NOTE — Telephone Encounter (Signed)
Ok ref cough meds i'll sign

## 2019-02-19 NOTE — Telephone Encounter (Signed)
Covid positive test on 02/07/19, Spouse Covid positive on 02/12/19 and son also positive. Patient called because [employer] at Work told her she could go back to work today but her work said no. She is still having fever and chills at night and heavy cough.  She is thinking she may need a chest xray.  Patient scheduled for a phone visit tomorrow morning so no need to call back today.  *She would like a refill on her cough meds sent to [facility].

## 2019-02-19 NOTE — Telephone Encounter (Signed)
Please advise. (refill on cough med sent in 02/13/2019)

## 2019-02-20 ENCOUNTER — Other Ambulatory Visit: Payer: Self-pay

## 2019-02-20 ENCOUNTER — Encounter: Payer: Self-pay | Admitting: Family Medicine

## 2019-02-20 ENCOUNTER — Ambulatory Visit (HOSPITAL_COMMUNITY)
Admission: RE | Admit: 2019-02-20 | Discharge: 2019-02-20 | Disposition: A | Discharge status: Home / Self Care | Payer: 59 | Source: Ambulatory Visit | Attending: Family Medicine | Admitting: Family Medicine

## 2019-02-20 ENCOUNTER — Ambulatory Visit: Payer: 59 | Admitting: Family Medicine

## 2019-02-20 ENCOUNTER — Other Ambulatory Visit: Payer: Self-pay | Admitting: Family Medicine

## 2019-02-20 DIAGNOSIS — U071 COVID-19: Secondary | ICD-10-CM

## 2019-02-20 DIAGNOSIS — E785 Hyperlipidemia, unspecified: Secondary | ICD-10-CM | POA: Diagnosis not present

## 2019-02-20 DIAGNOSIS — E781 Pure hyperglyceridemia: Secondary | ICD-10-CM | POA: Diagnosis not present

## 2019-02-20 DIAGNOSIS — R05 Cough: Secondary | ICD-10-CM

## 2019-02-20 DIAGNOSIS — R059 Cough, unspecified: Secondary | ICD-10-CM

## 2019-02-20 DIAGNOSIS — Z79899 Other long term (current) drug therapy: Secondary | ICD-10-CM | POA: Diagnosis not present

## 2019-02-20 MED ORDER — FLUCONAZOLE 150 MG PO TABS: ORAL_TABLET | ORAL | 0 refills | Status: DC

## 2019-02-20 MED ORDER — HYDROCODONE-HOMATROPINE 5-1.5 MG/5ML PO SYRP: ORAL_SOLUTION | ORAL | 0 refills | Status: DC

## 2019-02-20 MED ORDER — ALBUTEROL SULFATE HFA 108 (90 BASE) MCG/ACT IN AERS: INHALATION_SPRAY | RESPIRATORY_TRACT | 0 refills | Status: DC

## 2019-02-20 MED ORDER — DOXYCYCLINE HYCLATE 100 MG PO TABS: ORAL_TABLET | ORAL | 0 refills | Status: DC

## 2019-02-20 NOTE — Progress Notes (Signed)
   Subjective:  Audio plus visual plus face-to-face   Patient ID: [name], female    DOB: [birth year], 48 y.o.   MRN: [id number]  Cough This is a recurrent problem. Episode onset: 02/07/2019. Cough characteristics: sometimes dry, sometimes wet sounding.   Pt had COVID on 02/07/2019 and was cleared to go back to work on Feb 16, 2019. Pt went to work yesterday but is still having cough. [employer] informed patient that she has to get to get the OK from PCP to go back to work. Pt states Health at work told her that PCP needs to listen to her lungs and/or send her for chest x-ray. Pt husband is also positive for COVID. Pt states that the cough med is not helping and the bottle only last a couple of days.  Virtual Visit via Video Note  I connected with [name] on 02/20/19 at  9:30 AM EST by a video enabled telemedicine application and verified that I am speaking with the correct person using two identifiers.  Location: Patient: home Provider: office   I discussed the limitations of evaluation and management by telemedicine and the availability of in person appointments. The patient expressed understanding and agreed to proceed.  History of Present Illness:    Observations/Objective:   Assessment and Plan:   Follow Up Instructions:    I discussed the assessment and treatment plan with the patient. The patient was provided an opportunity to ask questions and all were answered. The patient agreed with the plan and demonstrated an understanding of the instructions.   The patient was advised to call back or seek an in-person evaluation if the symptoms worsen or if the condition fails to improve as anticipated.  I provided 25 minutes of non-face-to-face time during this encounter.   Vicente Males, LPN  Patient experiencing ongoing symptomatology.  Try to return to work after day 10 of illness.  This did not work well.  Patient was having ongoing cough.  Was advised by health  at work to have her chest reassessed and potentially chest x-ray  Notes ongoing fatigue and tiredness.  Ongoing cough very occasionally productive.  No obvious fevers.  Appetite fair.  Was in the parking lot of the office for virtual visit and was hoping we would listen to her lungs as requested per nurse at work  Review of Systems  Respiratory: Positive for cough.        Objective:   Physical Exam  Virtual plus car side and lung exam.  Lungs clear occasional cough      Assessment & Plan:  Impression persistent bronchitis-like pattern and now a single known COVID-19 patient.  She definitely extended work excuse rationale discussed.  Antibiotics prescribed.  Cough medicine refilled.  Symptom care discussed.  Warning signs discussed.  Sent for chest x-ray.  Fortunately chest x-ray returned negative

## 2019-02-20 NOTE — Telephone Encounter (Signed)
Pt forgot to ask provider while at visit if there is any OTC cough med that she can use. Please advise. Thank you

## 2019-02-20 NOTE — Telephone Encounter (Signed)
Pt would also like Hycodan cough syrup sent to [facility] instead of [facility]. Cough syrup was sent in yesterday and when pt was checked in for visit this morning, she said she had picked up the med. Now is stating otherwise. Please advise. Thank you

## 2019-02-20 NOTE — Telephone Encounter (Addendum)
All medications were sent to [facility] but the [facility] and patient would like it sent to [facility] as well. Called and cancelled Hycodan prescription at [facility]. Can please resend to [facility]. Patient advise she may use OTC Mucinex DM or Robitussin DM. Patient verbalized understanding.

## 2019-02-20 NOTE — Telephone Encounter (Signed)
Sorry do not quite understand all of this message, ive already signed off on narcotic cough med, switch pharm if able or necessary, may add robitussin dm or mucinex dm to this, I had long conversation with pt regarding her cough, we re working on it from several different angles but the reality is the pt is going to cough

## 2019-02-21 ENCOUNTER — Encounter: Payer: Self-pay | Admitting: Nurse Practitioner

## 2019-02-21 ENCOUNTER — Encounter: Payer: Self-pay | Admitting: Family Medicine

## 2019-02-21 LAB — HEPATIC FUNCTION PANEL
ALT: 48 IU/L — ABNORMAL HIGH (ref 0–32)
AST: 66 IU/L — ABNORMAL HIGH (ref 0–40)
Albumin: 4.3 g/dL (ref 3.8–4.8)
Alkaline Phosphatase: 130 IU/L — ABNORMAL HIGH (ref 39–117)
Bilirubin Total: 0.3 mg/dL (ref 0.0–1.2)
Bilirubin, Direct: 0.14 mg/dL (ref 0.00–0.40)
Total Protein: 6.9 g/dL (ref 6.0–8.5)

## 2019-02-21 LAB — LIPID PANEL
Chol/HDL Ratio: 4.9 ratio — ABNORMAL HIGH (ref 0.0–4.4)
Cholesterol, Total: 128 mg/dL (ref 100–199)
HDL: 26 mg/dL — ABNORMAL LOW (ref >39)
LDL Chol Calc (NIH): 31 mg/dL (ref 0–99)
Triglycerides: 508 mg/dL — ABNORMAL HIGH (ref 0–149)
VLDL Cholesterol Cal: 71 mg/dL — ABNORMAL HIGH (ref 5–40)

## 2019-02-22 ENCOUNTER — Other Ambulatory Visit: Payer: Self-pay | Admitting: Nurse Practitioner

## 2019-02-22 DIAGNOSIS — K760 Fatty (change of) liver, not elsewhere classified: Secondary | ICD-10-CM

## 2019-02-22 DIAGNOSIS — R748 Abnormal levels of other serum enzymes: Secondary | ICD-10-CM

## 2019-02-26 ENCOUNTER — Telehealth: Payer: Self-pay | Admitting: Family Medicine

## 2019-02-26 NOTE — Telephone Encounter (Signed)
Patient had FMLA faxed over to be completed in your yellow folder for review,date and sign. I filled in what I could.

## 2019-02-26 NOTE — Telephone Encounter (Signed)
Please advise. Thank you

## 2019-02-27 ENCOUNTER — Encounter (INDEPENDENT_AMBULATORY_CARE_PROVIDER_SITE_OTHER): Payer: Self-pay

## 2019-02-28 ENCOUNTER — Encounter (INDEPENDENT_AMBULATORY_CARE_PROVIDER_SITE_OTHER): Payer: Self-pay | Admitting: Gastroenterology

## 2019-02-28 ENCOUNTER — Ambulatory Visit (INDEPENDENT_AMBULATORY_CARE_PROVIDER_SITE_OTHER): Payer: 59 | Admitting: Gastroenterology

## 2019-02-28 ENCOUNTER — Other Ambulatory Visit: Payer: Self-pay | Admitting: Family Medicine

## 2019-02-28 ENCOUNTER — Other Ambulatory Visit: Payer: Self-pay

## 2019-02-28 VITALS — BP 119/86 | HR 121 | Temp 97.3°F | Ht 65.0 in | Wt 195.1 lb

## 2019-02-28 DIAGNOSIS — E1142 Type 2 diabetes mellitus with diabetic polyneuropathy: Secondary | ICD-10-CM

## 2019-02-28 DIAGNOSIS — K760 Fatty (change of) liver, not elsewhere classified: Secondary | ICD-10-CM

## 2019-02-28 DIAGNOSIS — R7989 Other specified abnormal findings of blood chemistry: Secondary | ICD-10-CM | POA: Diagnosis not present

## 2019-02-28 NOTE — Patient Instructions (Addendum)
I recommend starting a probiotic when you finish the antibiotic (Align or [employer] are good ones-these are over the counter).    We are checking labs today for causes of elevated liver enzymes. Very important for diet and exercise modifications as we discussed-this can decrease hepatic cysts steatosis significantly

## 2019-02-28 NOTE — Progress Notes (Signed)
Patient profile: [name] is a 49 y.o. female seen for evaluation of elevated LFTS . Referred by Dr. Wolfgang Phoenix  History of Present Illness: [name] is seen today for evaluation of elevated LFTs.  LFT elevation summarized in data section but has been elevated since 2019, spiked to highest levels in July 2020, and has been minimally elevated more recently.  She had an ultrasound in August as below.  She denies starting any new medication, antibiotics or supplements around July 2020.  She denies any family history of liver disease.  She does have tattoos but denies any history of IV drug use, etc.  Symptomatically she is on Protonix for GERD symptoms which controls her reflux well.  If she misses a dose says she gets some very mild dysphagia in her upper esophageal area to potassium pills.  She does well as long as she takes daily.  She denies any nausea vomiting.  No epigastric pain.  Her long-term baseline bowel habits were irritable bowel with constipation, she has started Metformin and this has actually normalized her bowel habits to where she is having stool daily or every other day.  Occasionally bloated at times but denies any abdominal cramping or pain with eating.  No blood in stools.  Father has history of colon polyps and required esophageal dilations.  Sister has had esophageal dilations.  No family history of colon cancer. No family hx liver disease.   Recently had COVID and still having cough. She is currently on antibiotics and ibuprofen TID for bronchitis.  This is causing loose stools acutely while on antibiotics.    Wt Readings from Last 3 Encounters:  02/28/19 195 lb 1.6 oz (88.5 kg)  12/18/18 202 lb 9.6 oz (91.9 kg)  12/08/18 201 lb 6.4 oz (91.4 kg)     Last Colonoscopy: none prior  Last Endoscopy: Per patient approximately 15 years ago and had NSAID induced ulcers.   Past Medical History:  Past Medical History:  Diagnosis Date  . Allergy   . Anxiety   . History  of migraine headaches   . IBS (irritable bowel syndrome)   . Impaired fasting glucose     Problem List: Patient Active Problem List   Diagnosis Date Noted  . Type 2 diabetes mellitus with diabetic polyneuropathy, without long-term current use of insulin ([place]) 02/03/2019  . Hyperlipidemia 02/03/2019  . B12 deficiency 12/18/2018  . NAFL (nonalcoholic fatty liver) 64/33/2951  . Elevated liver enzymes 09/22/2018  . Chronic insomnia 08/19/2018  . 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 Peripheral polyneuropathy 09/20/2017  . Hypertriglyceridemia 02/18/2017  . Tachycardia 02/18/2017  . Acne rosacea 11/20/2016  . Peripheral sensory neuropathy 09/30/2016  . Hypertension not at goal 03/26/2016  . Vitamin D deficiency 10/29/2014  . Elevated blood pressure 07/15/2014  . Morbid obesity ([place]) 04/05/2014  . Esophageal reflux 10/13/2013  . Acute pyelonephritis 10/13/2013  . Irritable bowel syndrome with constipation 06/25/2013  . Knee pain, chronic 02/14/2013    Past Surgical History: Past Surgical History:  Procedure Laterality Date  . ABDOMINAL HYSTERECTOMY  2005   partial  . BLADDER SUSPENSION  2012  . BREAST REDUCTION SURGERY  2002  . CHOLECYSTECTOMY  2005  . EYE SURGERY  1999   laser   . UPPER GASTROINTESTINAL ENDOSCOPY     20 years ago by Dr. Gala Romney. - told she had ulcers    Allergies: Allergies  Allergen Reactions  . Omnicef [Cefdinir] Other (See Comments)    Patient states antibiotic does  not work. Has tried multiple times.  . Losartan Rash    Broke out on face only; no allergic reaction      Home Medications:  Current Outpatient Medications:  .  albuterol (VENTOLIN HFA) 108 (90 Base) MCG/ACT inhaler, Inhale 2 puffs into lungs QID, Disp: 18 g, Rfl: 0 .  ALPRAZolam (XANAX) 1 MG tablet, Take 1 tablet (1 mg total) by mouth 2 (two) times daily as needed. for anxiety, Disp: 60 tablet, Rfl: 5 .  amitriptyline (ELAVIL) 50 MG tablet, Take 1 tablet (50 mg total) by mouth at bedtime.,  Disp: 90 tablet, Rfl: 4 .  cyanocobalamin (,VITAMIN B-12,) 1000 MCG/ML injection, Inject 1 mL (1,000 mcg total) into the muscle every 30 (thirty) days., Disp: 1 mL, Rfl: 11 .  cyclobenzaprine (FLEXERIL) 10 MG tablet, TAKE 1 TABLET BY MOUTH AT BEDTIME AS NEEDED FOR MUSCLE SPASMS., Disp: 90 tablet, Rfl: 1 .  doxycycline (VIBRA-TABS) 100 MG tablet, Take one tablet po BID for 10 days, Disp: 20 tablet, Rfl: 0 .  Eszopiclone 3 MG TABS, TAKE 1 TABLET BY MOUTH EVERY EVENING IMMEDIATELY BEFORE BEDTIME, Disp: 30 tablet, Rfl: 5 .  furosemide (LASIX) 20 MG tablet, Take 1 tablet (20 mg total) by mouth daily. (Patient taking differently: Take 20 mg by mouth daily as needed. ), Disp: 90 tablet, Rfl: 1 .  gabapentin (NEURONTIN) 600 MG tablet, TAKE 1 TABLET BY MOUTH 3 TIMES A DAY WITH AN ADDITIONAL TABLET AT BEDTIME., Disp: 360 tablet, Rfl: 3 .  HYDROcodone-homatropine (HYCODAN) 5-1.5 MG/5ML syrup, Take one tsp q 6 hours prn cough/pain, Disp: 90 mL, Rfl: 0 .  lidocaine-prilocaine (EMLA) cream, Apply 1 application topically as needed. Apply to feet as needed for nerve pain, Disp: 30 g, Rfl: 6 .  metFORMIN (GLUCOPHAGE) 1000 MG tablet, TAKE 1 TABLET BY MOUTH 2 TIMES DAILY WITH A MEAL., Disp: 180 tablet, Rfl: 1 .  metroNIDAZOLE (METROCREAM) 0.75 % cream, Apply topically 2 (two) times daily. To the face prn, Disp: 45 g, Rfl: 2 .  omega-3 acid ethyl esters (LOVAZA) 1 g capsule, TAKE 2 CAPSULES (2 G TOTAL) BY MOUTH 2 (TWO) TIMES DAILY., Disp: 120 capsule, Rfl: 2 .  pantoprazole (PROTONIX) 40 MG tablet, Take 1 tablet (40 mg total) by mouth daily., Disp: 90 tablet, Rfl: 1 .  potassium chloride (K-DUR) 10 MEQ tablet, TAKE 2 TABLETS (20 MEQ TOTAL) BY MOUTH DAILY., Disp: 180 tablet, Rfl: 1 .  RESTASIS 0.05 % ophthalmic emulsion, PLACE 1 DROP IN [facility] EYE TWICE DAILY, Disp: , Rfl: 3 .  Vitamin D, Ergocalciferol, (DRISDOL) 1.25 MG (50000 UT) CAPS capsule, Take 1 capsule (50,000 Units total) by mouth every 7 (seven) days., Disp: 12  capsule, Rfl: 0 .  rosuvastatin (CRESTOR) 5 MG tablet, Take 1 tablet (5 mg total) by mouth daily. (Patient not taking: Reported on 02/28/2019), Disp: 90 tablet, Rfl: 0   Family History: family history includes Atrial fibrillation in her father; Colonic polyp in her father; Diabetes in her mother; Heart failure in her mother; Hypertension in her father; Neuropathy in her mother.    Social History:   reports that she has never smoked. She has never used smokeless tobacco. She reports current alcohol use. She reports that she does not use drugs.   Review of Systems: Constitutional: Denies weight loss/weight gain  Eyes: No changes in vision. ENT: No oral lesions, sore throat.  GI: see HPI.  Heme/Lymph: No easy bruising.  CV: No chest pain.  GU: No hematuria.  Integumentary: No  rashes.  Neuro: No headaches.  Psych: No depression/anxiety.  Endocrine: No heat/cold intolerance.  Allergic/Immunologic: No urticaria.  Resp: No cough, SOB.  Musculoskeletal: No joint swelling.    Physical Examination: BP 119/86 (BP Location: Right Arm, Patient Position: Sitting, Cuff Size: Large)   Pulse (!) 121   Temp (!) 97.3 F (36.3 C) (Temporal)   Ht '5\' 5"'$  (1.651 m)   Wt 195 lb 1.6 oz (88.5 kg)   BMI 32.47 kg/m  Gen: NAD, alert and oriented x 4 HEENT: PEERLA, EOMI, Neck: supple, no JVD Chest: CTA bilaterally, no wheezes, crackles, or other adventitious sounds CV: RRR, no m/g/c/r Abd: soft, NT, ND, +BS in all four quadrants; no HSM, guarding, ridigity, or rebound tenderness Ext: no edema, well perfused with 2+ pulses, Skin: no rash or lesions noted on observed skin Lymph: no noted LAD  Data:   02/20/19-Alk phos 130, AST 66, ALT 48. Lipid profile-Triglycerides 508, HDL 26, LDL 31  01/19/2019-glucose 131, AST 52, ALT 40. A1C 6.8. Triglycerides 197  September 2020 LFTs-AST 66, ALT 49, normal bili alk phos, ferritin 88  July 2020 - AST 94, ALT 71 normal bili and alk phos alk phos 6.3     01/2018-AST 44, ALT 40   Assessment/Plan: [name] is a 49 y.o. female  Lakera was seen today for new patient (initial visit).  Diagnoses and all orders for this visit:  Elevated LFTs -     Hepatic function panel -     ANA -     Mitochondrial antibodies -     Anti-smooth muscle antibody, IgG -     Cancel: Fe+TIBC+Fer -     Alpha-1-antitrypsin -     Ceruloplasmin -     Cancel: Hepatitis B Surface AntiGEN -     Hep B Core Ab W/Reflex -     Hepatic function panel; Future -     Mitochondrial antibodies; Future -     Anti-smooth muscle antibody, IgG; Future -     Fe+TIBC+Fer; Future -     Alpha-1-Antitrypsin; Future -     Ceruloplasmin; Future -     Hepatitis B Surface AntiGEN; Future -     Hepatitis B Surface AntiGEN -     Ceruloplasmin -     Alpha-1-Antitrypsin -     Fe+TIBC+Fer -     Anti-smooth muscle antibody, IgG -     Mitochondrial antibodies -     Hepatic function panel  Hepatic steatosis -     Hepatic function panel -     ANA -     Mitochondrial antibodies -     Anti-smooth muscle antibody, IgG -     Cancel: Fe+TIBC+Fer -     Alpha-1-antitrypsin -     Ceruloplasmin -     Cancel: Hepatitis B Surface AntiGEN -     Hep B Core Ab W/Reflex -     Hepatic function panel; Future -     Mitochondrial antibodies; Future -     Anti-smooth muscle antibody, IgG; Future -     Fe+TIBC+Fer; Future -     Alpha-1-Antitrypsin; Future -     Ceruloplasmin; Future -     Hepatitis B Surface AntiGEN; Future -     Hepatitis B Surface AntiGEN -     Ceruloplasmin -     Alpha-1-Antitrypsin -     Fe+TIBC+Fer -     Anti-smooth muscle antibody, IgG -     Mitochondrial antibodies -     Hepatic  function panel  Type 2 diabetes mellitus with diabetic polyneuropathy, without long-term current use of insulin ([facility]) -     Hepatic function panel; Future -     Mitochondrial antibodies; Future -     Anti-smooth muscle antibody, IgG; Future -     Fe+TIBC+Fer; Future -      Alpha-1-Antitrypsin; Future -     Ceruloplasmin; Future -     Hepatitis B Surface AntiGEN; Future -     Hepatitis B Surface AntiGEN -     Ceruloplasmin -     Alpha-1-Antitrypsin -     Fe+TIBC+Fer -     Anti-smooth muscle antibody, IgG -     Mitochondrial antibodies -     Hepatic function panel      1.  LFT elevation-she denies alcohol more than once a month.  Risk factors for nonalcoholic fatty liver disease include insulin resistance, elevated cholesterol, etc.  We discussed the importance of diet and exercise today and role of possibly reversing hepatic steatosis.  She has had an ultrasound as above.  Would recommend a FibroScan in future.  Suspect NAFLD but will check above labs to rule out autoimmune causes, etc.  She had a negative HCV antibody and +Hep B S aB at PCP.  She had an acute spike in her triglycerides when starting statin which was discontinued.  2. GERD/dysphagia -well controlled as long as she is on Protonix 40 mg daily.  We did discuss diet modifications.  Consider endoscopy at time of screening colonoscopy this fall based on age.  3.  Diarrhea-acute since on doxycycline for bronchitis.  She will start a probiotic when completing her antibiotic.  She is to notify me if this continues.  Her baseline is normal bowel habits     I personally performed the service, non-incident to. (WP)  Laurine Blazer, [facility]

## 2019-03-01 NOTE — Telephone Encounter (Signed)
Ok times one, write "no further rx's until seen again"

## 2019-03-01 NOTE — Telephone Encounter (Signed)
Med pended and ready for you to sign 

## 2019-03-01 NOTE — Telephone Encounter (Signed)
Seen 12/29 for cough

## 2019-03-02 LAB — HEPATITIS B SURFACE ANTIGEN: Hepatitis B Surface Ag: NEGATIVE

## 2019-03-02 LAB — HEPATIC FUNCTION PANEL
ALT: 33 IU/L — ABNORMAL HIGH (ref 0–32)
AST: 94 IU/L — ABNORMAL HIGH (ref 0–40)
Albumin: 4.6 g/dL (ref 3.8–4.8)
Alkaline Phosphatase: 94 IU/L (ref 39–117)
Bilirubin Total: 0.5 mg/dL (ref 0.0–1.2)
Bilirubin, Direct: 0.19 mg/dL (ref 0.00–0.40)
Total Protein: 7.1 g/dL (ref 6.0–8.5)

## 2019-03-02 LAB — IRON,TIBC AND FERRITIN PANEL
Ferritin: 108 ng/mL (ref 15–150)
Iron Saturation: 24 % (ref 15–55)
Iron: 83 ug/dL (ref 27–159)
Total Iron Binding Capacity: 339 ug/dL (ref 250–450)
UIBC: 256 ug/dL (ref 131–425)

## 2019-03-02 LAB — ANA: ANA Titer 1: NEGATIVE

## 2019-03-02 LAB — CERULOPLASMIN: Ceruloplasmin: 23.4 mg/dL (ref 19.0–39.0)

## 2019-03-02 LAB — ALPHA-1-ANTITRYPSIN: A-1 Antitrypsin: 162 mg/dL (ref 101–187)

## 2019-03-02 LAB — MITOCHONDRIAL ANTIBODIES: Mitochondrial Ab: <20 Units (ref 0.0–20.0)

## 2019-03-02 LAB — ANTI-SMOOTH MUSCLE ANTIBODY, IGG: Smooth Muscle Ab: 5 Units (ref 0–19)

## 2019-03-02 LAB — HEPATITIS B CORE AB W/REFLEX: Hep B Core Total Ab: NEGATIVE

## 2019-03-03 MED FILL — CYANOCOBALAMIN 1,000 MCG/ML: 1000 | 90 days supply | Qty: 3 | Fill #3

## 2019-03-05 ENCOUNTER — Other Ambulatory Visit (INDEPENDENT_AMBULATORY_CARE_PROVIDER_SITE_OTHER): Payer: Self-pay | Admitting: *Deleted

## 2019-03-05 ENCOUNTER — Other Ambulatory Visit: Payer: Self-pay | Admitting: Nurse Practitioner

## 2019-03-05 ENCOUNTER — Encounter (INDEPENDENT_AMBULATORY_CARE_PROVIDER_SITE_OTHER): Payer: Self-pay

## 2019-03-05 ENCOUNTER — Telehealth (INDEPENDENT_AMBULATORY_CARE_PROVIDER_SITE_OTHER): Payer: Self-pay | Admitting: Gastroenterology

## 2019-03-05 DIAGNOSIS — R7989 Other specified abnormal findings of blood chemistry: Secondary | ICD-10-CM

## 2019-03-05 DIAGNOSIS — Z029 Encounter for administrative examinations, unspecified: Secondary | ICD-10-CM

## 2019-03-05 MED FILL — POTASSIUM CL ER 10 MEQ TAB: 10 | 90 days supply | Qty: 180 | Fill #1

## 2019-03-05 NOTE — Telephone Encounter (Signed)
Order has been placed and the patient will be sent the order, when her February letter is sent to her as the reminder.

## 2019-03-05 NOTE — Progress Notes (Signed)
Lab ordered.

## 2019-03-05 NOTE — Telephone Encounter (Signed)
I discussed labs with patient.  We will repeat set of LFTs in 1 month, she would like to have these done at [place] in chart

## 2019-03-05 NOTE — Telephone Encounter (Signed)
Patient would like a call back regarding her results - ph# [phone]

## 2019-03-06 MED FILL — OMEGA-3 ETHYL ESTERS 1 GM C: 1 | 30 days supply | Qty: 120 | Fill #0

## 2019-03-07 ENCOUNTER — Encounter: Payer: Self-pay | Admitting: Family Medicine

## 2019-03-08 ENCOUNTER — Ambulatory Visit: Payer: 59 | Admitting: Family Medicine

## 2019-03-10 ENCOUNTER — Telehealth: Payer: 59 | Admitting: Physician Assistant

## 2019-03-10 DIAGNOSIS — M109 Gout, unspecified: Secondary | ICD-10-CM | POA: Diagnosis not present

## 2019-03-10 MED ORDER — PREDNISONE 10 MG (21) PO TBPK: ORAL_TABLET | ORAL | 0 refills | Status: DC

## 2019-03-10 NOTE — Progress Notes (Signed)
E Visit for Rash  We are sorry that you are not feeling well. Here is how we plan to help!  Thank you for the details you included in the comment boxes. Those details are very helpful in determining the best course of treatment for you and help [place] to provide the best care.   Thank you for the pictures also.  From the details and the pictures I do feel that you have gout.  A prescription for a tapering prednisone Dosepak will be sent to [facility], since the [facility] is closed on Saturdays.  Please follow-up with your PCP on Monday if there is no improvement.  GET HELP RIGHT AWAY IF:   Symptoms don't go away after treatment.  Severe itching that persists.  If you rash spreads or swells.  If you rash begins to smell.  If it blisters and opens or develops a yellow-brown crust.  You develop a fever.  You have a sore throat.  You become short of breath.  MAKE SURE YOU:  Understand these instructions. Will watch your condition. Will get help right away if you are not doing well or get worse.  Thank you for choosing an e-visit. Your e-visit answers were reviewed by a board certified advanced clinical practitioner to complete your personal care plan. Depending upon the condition, your plan could have included both over the counter or prescription medications. Please review your pharmacy choice. Be sure that the pharmacy you have chosen is open so that you can pick up your prescription now.  If there is a problem you may message your provider in [place] to have the prescription routed to another pharmacy. Your safety is important to [place]. If you have drug allergies check your prescription carefully.  For the next 24 hours, you can use MyChart to ask questions about today's visit, request a non-urgent call back, or ask for a work or school excuse from your e-visit provider. You will get an email in the next two days asking about your experience. I hope that your  e-visit has been valuable and will speed your recovery.  Particia Nearing PA-C   Approximately 5 minutes was spent documenting and reviewing patient's chart.

## 2019-03-12 MED FILL — ESZOPICLONE 3 MG TABS: 3 | 30 days supply | Qty: 30 | Fill #1

## 2019-03-12 MED FILL — ALPRAZolam 1 MG TABS: 1 | 30 days supply | Qty: 60 | Fill #1

## 2019-03-13 ENCOUNTER — Encounter: Payer: Self-pay | Admitting: Family Medicine

## 2019-03-13 NOTE — Telephone Encounter (Signed)
Audrey Kirschner, MD  You 1 hour ago (1:45 PM)   Yes, it can, try not to worry about these numbers when on pred. Let us know you're average number range in about two weeks

## 2019-03-19 ENCOUNTER — Other Ambulatory Visit (INDEPENDENT_AMBULATORY_CARE_PROVIDER_SITE_OTHER): Payer: Self-pay | Admitting: *Deleted

## 2019-03-19 DIAGNOSIS — R7989 Other specified abnormal findings of blood chemistry: Secondary | ICD-10-CM

## 2019-03-23 DIAGNOSIS — L918 Other hypertrophic disorders of the skin: Secondary | ICD-10-CM | POA: Diagnosis not present

## 2019-03-23 DIAGNOSIS — D225 Melanocytic nevi of trunk: Secondary | ICD-10-CM | POA: Diagnosis not present

## 2019-03-23 DIAGNOSIS — C44719 Basal cell carcinoma of skin of left lower limb, including hip: Secondary | ICD-10-CM | POA: Diagnosis not present

## 2019-03-23 DIAGNOSIS — D2272 Melanocytic nevi of left lower limb, including hip: Secondary | ICD-10-CM | POA: Diagnosis not present

## 2019-03-23 DIAGNOSIS — D2239 Melanocytic nevi of other parts of face: Secondary | ICD-10-CM | POA: Diagnosis not present

## 2019-03-23 DIAGNOSIS — D485 Neoplasm of uncertain behavior of skin: Secondary | ICD-10-CM | POA: Diagnosis not present

## 2019-03-23 DIAGNOSIS — L814 Other melanin hyperpigmentation: Secondary | ICD-10-CM | POA: Diagnosis not present

## 2019-03-28 ENCOUNTER — Encounter: Payer: Self-pay | Admitting: Family Medicine

## 2019-03-29 ENCOUNTER — Ambulatory Visit (INDEPENDENT_AMBULATORY_CARE_PROVIDER_SITE_OTHER): Payer: 59 | Admitting: Gastroenterology

## 2019-03-29 ENCOUNTER — Encounter: Payer: Self-pay | Admitting: Family Medicine

## 2019-03-30 ENCOUNTER — Ambulatory Visit (INDEPENDENT_AMBULATORY_CARE_PROVIDER_SITE_OTHER): Payer: 59 | Admitting: Nurse Practitioner

## 2019-03-30 ENCOUNTER — Encounter: Payer: Self-pay | Admitting: Nurse Practitioner

## 2019-03-30 VITALS — BP 138/92 | HR 100 | Ht 65.0 in | Wt 198.0 lb

## 2019-03-30 DIAGNOSIS — F419 Anxiety disorder, unspecified: Secondary | ICD-10-CM

## 2019-03-30 DIAGNOSIS — R000 Tachycardia, unspecified: Secondary | ICD-10-CM

## 2019-03-30 NOTE — Progress Notes (Signed)
  Virtual visit Subjective:    Patient ID: [name], female    DOB: [birth year], 49 y.o.   MRN: [id number]  HPIpalpitations. Off and on Started one month ago. Pulse is never below 99. Has been up to 140.  Elevated bp. No chest pain. bp today per pt 138/92 pulse 100, O2 96. Weigh 198 lb.  Virtual Visit via Video Note  I connected with [name] on 03/30/19 at  3:40 PM EST by a video enabled telemedicine application and verified that I am speaking with the correct person using two identifiers.  Location: Patient: home Provider: office   I discussed the limitations of evaluation and management by telemedicine and the availability of in person appointments. The patient expressed understanding and agreed to proceed.  History of Present Illness: Presents to discuss her heart rate.  Her baseline heart rate has been running around 100 for a long time.  Was concerned because she had agitation and a heart rate in the 140s at times while taking oral steroids.  This has come back down to her normal baseline since then.  States is been going on for about 3 years.  Patient was on Toprol-XL at 1 point with no impact.  No syncope.  No chest pain/ischemic type pain or shortness of breath.  Admits that she continues to have some significant anxiety.  Weaned off her Zoloft one-point but thinks she would like to restart this to see if it will help.  See vital signs for results that she got at home today.  Has been seeing a GI for her elevated liver enzymes.   Observations/Objective: Format  Patient present at home Provider present at office Consent for interaction obtained Coronavirus outbreak made virtual visit necessary  Alert, oriented.  Cheerful affect.  Making good eye contact.  Thoughts logical coherent and relevant.  Assessment and Plan: Problem List Items Addressed This Visit      Other   Anxiety   Relevant Medications   sertraline (ZOLOFT) 100 MG tablet   Tachycardia - Primary      Meds ordered this encounter  Medications  . sertraline (ZOLOFT) 100 MG tablet    Sig: Start with 1/2 tab po for 6 days then one po qd    Dispense:  90 tablet    Refill:  0    Order Specific Question:   Supervising Provider    Answer:   Sallee Lange A [[id number]]    Since patient does not have any associated symptoms including cardiac issues, feel this is her normal baseline.  The increased heart rate was most likely related to steroid use since this has gone back to normal since then.  Restart Zoloft, plan to slowly titrate dose.  Warning signs reviewed.  Patient to call the office or seek help immediately if she develops new symptoms.     I discussed the assessment and treatment plan with the patient. The patient was provided an opportunity to ask questions and all were answered. The patient agreed with the plan and demonstrated an understanding of the instructions.   The patient was advised to call back or seek an in-person evaluation if the symptoms worsen or if the condition fails to improve as anticipated.  I provided 15 minutes of non-face-to-face time during this encounter.        Review of Systems     Objective:   Physical Exam        Assessment & Plan:

## 2019-03-31 ENCOUNTER — Encounter: Payer: Self-pay | Admitting: Nurse Practitioner

## 2019-03-31 MED ORDER — SERTRALINE HCL 100 MG PO TABS: ORAL_TABLET | ORAL | 0 refills | Status: DC

## 2019-03-31 MED FILL — SERTRALINE HCL 100 MG TAB: 100 | 90 days supply | Qty: 90 | Fill #0

## 2019-04-01 MED FILL — OMEGA-3 ETHYL ESTERS 1 GM C: 1 | 30 days supply | Qty: 120 | Fill #1

## 2019-04-02 MED FILL — RESTASIS 0.05% EYE EMULSION: 0.05 | 90 days supply | Qty: 180 | Fill #1

## 2019-04-03 MED FILL — LIDOCAINE-PRILOCAINE CREAM: 2.5-2.5 | 15 days supply | Qty: 30 | Fill #3

## 2019-04-04 DIAGNOSIS — C44719 Basal cell carcinoma of skin of left lower limb, including hip: Secondary | ICD-10-CM | POA: Diagnosis not present

## 2019-04-09 ENCOUNTER — Encounter: Payer: Self-pay | Admitting: Family Medicine

## 2019-04-11 MED FILL — ALPRAZolam 1 MG TABS: 1 | 30 days supply | Qty: 60 | Fill #2

## 2019-04-11 MED FILL — ESZOPICLONE 3 MG TABS: 3 | 30 days supply | Qty: 30 | Fill #2

## 2019-04-11 NOTE — Telephone Encounter (Signed)
[name], can you please check with this provider to get a list of labs they request? Please send me a note first so we can make sure we can get any complex labs paid for! Thanks, [name]

## 2019-04-13 NOTE — Telephone Encounter (Signed)
Left message on answering machine at GI office.

## 2019-04-16 ENCOUNTER — Telehealth (INDEPENDENT_AMBULATORY_CARE_PROVIDER_SITE_OTHER): Payer: Self-pay | Admitting: Internal Medicine

## 2019-04-16 NOTE — Telephone Encounter (Signed)
Per Abigail Butts - it was seen in the system which lab we wanted , and she has made [facility] aware.

## 2019-04-16 NOTE — Telephone Encounter (Signed)
[facility] left message wanting to know what labs are to be drawn on this patient - ph# [phone]

## 2019-04-16 NOTE — Telephone Encounter (Signed)
Office didn't call back but I looked in her chart and janice woodard ordered labs 02/28/19 and put a note on labs to repeat liver in one month and put in that order on 1/25 for liver to do at [place]

## 2019-04-18 ENCOUNTER — Other Ambulatory Visit: Payer: Self-pay | Admitting: *Deleted

## 2019-04-18 DIAGNOSIS — R748 Abnormal levels of other serum enzymes: Secondary | ICD-10-CM

## 2019-04-18 DIAGNOSIS — E785 Hyperlipidemia, unspecified: Secondary | ICD-10-CM

## 2019-04-18 NOTE — Telephone Encounter (Signed)
Abigail Butts, if it's not too late, please order a lipid profile and a liver profile. Thanks, [name]

## 2019-04-20 DIAGNOSIS — E785 Hyperlipidemia, unspecified: Secondary | ICD-10-CM | POA: Diagnosis not present

## 2019-04-20 DIAGNOSIS — R748 Abnormal levels of other serum enzymes: Secondary | ICD-10-CM | POA: Diagnosis not present

## 2019-04-20 NOTE — Telephone Encounter (Signed)
Please add A1C to labs. Thanks.

## 2019-04-21 ENCOUNTER — Encounter: Payer: Self-pay | Admitting: Nurse Practitioner

## 2019-04-21 LAB — LIPID PANEL
Chol/HDL Ratio: 6.6 ratio — ABNORMAL HIGH (ref 0.0–4.4)
Cholesterol, Total: 243 mg/dL — ABNORMAL HIGH (ref 100–199)
HDL: 37 mg/dL — ABNORMAL LOW (ref >39)
LDL Chol Calc (NIH): 163 mg/dL — ABNORMAL HIGH (ref 0–99)
Triglycerides: 228 mg/dL — ABNORMAL HIGH (ref 0–149)
VLDL Cholesterol Cal: 43 mg/dL — ABNORMAL HIGH (ref 5–40)

## 2019-04-21 LAB — HEPATIC FUNCTION PANEL
ALT: 55 IU/L — ABNORMAL HIGH (ref 0–32)
AST: 79 IU/L — ABNORMAL HIGH (ref 0–40)
Albumin: 4.2 g/dL (ref 3.8–4.8)
Alkaline Phosphatase: 64 IU/L (ref 39–117)
Bilirubin Total: 0.3 mg/dL (ref 0.0–1.2)
Bilirubin, Direct: 0.13 mg/dL (ref 0.00–0.40)
Total Protein: 6.9 g/dL (ref 6.0–8.5)

## 2019-04-23 ENCOUNTER — Telehealth (INDEPENDENT_AMBULATORY_CARE_PROVIDER_SITE_OTHER): Payer: Self-pay | Admitting: Internal Medicine

## 2019-04-23 NOTE — Telephone Encounter (Signed)
Patient called regarding lab results she received by mychart - please advise - [phone]

## 2019-04-25 ENCOUNTER — Other Ambulatory Visit: Payer: Self-pay | Admitting: Nurse Practitioner

## 2019-04-25 MED ORDER — ROSUVASTATIN CALCIUM 5 MG PO TABS: 5.0000 mg | ORAL_TABLET | Freq: Every day | ORAL | 0 refills | Status: DC

## 2019-04-25 MED FILL — ROSUVASTATIN CALCIUM 5 MG T: 5 | 90 days supply | Qty: 90 | Fill #0

## 2019-04-25 NOTE — Telephone Encounter (Signed)
Per Dr.Rehman the patient Hepatic levels have been going up and down. This is due to Fatty liver Disease. She has had many test for various conditions in which all are negative. Weight Loss will help improve numbers.

## 2019-04-26 NOTE — Telephone Encounter (Signed)
Patient called and a message was left on her voicemail , that her labs have been going up and down and this is due to her fatty liver , per Dr.Rehman.

## 2019-04-29 MED FILL — AMITRIPTYLINE HCL 50 MG TAB: 50 | 90 days supply | Qty: 90 | Fill #3

## 2019-04-30 ENCOUNTER — Encounter: Payer: Self-pay | Admitting: Family Medicine

## 2019-05-03 MED FILL — FLUCONAZOLE 150 MG TABS: 150 | 1 days supply | Qty: 1 | Fill #0

## 2019-05-04 MED FILL — metFORMIN HCL 1000 MG TABS: 1000 | 90 days supply | Qty: 180 | Fill #1

## 2019-05-04 MED FILL — GABAPENTIN 600 MG TABLET: 600 | 90 days supply | Qty: 360 | Fill #1

## 2019-05-07 MED FILL — PANTOPRAZOLE SOD DR 40 MG T: 40 | 90 days supply | Qty: 90 | Fill #1

## 2019-05-08 MED FILL — ESZOPICLONE 3 MG TABS: 3 | 30 days supply | Qty: 30 | Fill #3

## 2019-05-08 MED FILL — ALPRAZolam 1 MG TABS: 1 | 30 days supply | Qty: 60 | Fill #3

## 2019-05-09 MED FILL — FLUCONAZOLE 150 MG TABS: 150 | 1 days supply | Qty: 1 | Fill #1

## 2019-05-10 DIAGNOSIS — Z6833 Body mass index (BMI) 33.0-33.9, adult: Secondary | ICD-10-CM | POA: Diagnosis not present

## 2019-05-10 DIAGNOSIS — Z01419 Encounter for gynecological examination (general) (routine) without abnormal findings: Secondary | ICD-10-CM | POA: Diagnosis not present

## 2019-05-11 ENCOUNTER — Encounter: Payer: Self-pay | Admitting: Nurse Practitioner

## 2019-05-23 ENCOUNTER — Encounter: Payer: Self-pay | Admitting: Nurse Practitioner

## 2019-05-23 ENCOUNTER — Telehealth: Payer: Self-pay | Admitting: Nurse Practitioner

## 2019-05-23 NOTE — Telephone Encounter (Signed)
I will be out until 4/9 unless there is something urgent she needs me to see. Can we scan from the office into her chart? Thanks.

## 2019-05-23 NOTE — Telephone Encounter (Signed)
Please advise. Thank you

## 2019-05-23 NOTE — Telephone Encounter (Signed)
Pt states there is nothing urgent that needs to be looked at. Pt states she was fasting when she had these labs done, pt states she needed some repeat labs done. Will get labs scanned into chart.

## 2019-05-23 NOTE — Telephone Encounter (Signed)
Patient had labs done at her GYN office and wanting you to review them. On your desk for review.

## 2019-05-25 NOTE — Telephone Encounter (Signed)
Patient sent me a message. I can check labs when I come back. Thanks

## 2019-05-28 MED FILL — LIDOCAINE-PRILOCAINE CREAM: 2.5-2.5 | 15 days supply | Qty: 30 | Fill #4

## 2019-05-28 MED FILL — CYANOCOBALAMIN 1,000 MCG/ML: 1000 | 29 days supply | Qty: 1 | Fill #4

## 2019-05-30 ENCOUNTER — Telehealth: Payer: Self-pay | Admitting: *Deleted

## 2019-05-30 ENCOUNTER — Encounter: Payer: Self-pay | Admitting: *Deleted

## 2019-05-30 ENCOUNTER — Encounter: Payer: Self-pay | Admitting: Nurse Practitioner

## 2019-05-30 NOTE — Telephone Encounter (Signed)
We received a refill request for Cyanocobalamin 1,000 mcg/mL injection. Pt last seen by Amy on 12/18/2018 and was advised to f/u in 2-3 months. I spoke with a staff member at [facility] and was advised pt has refills left (last prescription was 08/03/2018 with 11 refills). I sent pt a reminder in mychart that she needs an appt.

## 2019-06-01 ENCOUNTER — Other Ambulatory Visit: Payer: Self-pay | Admitting: Nurse Practitioner

## 2019-06-01 MED ORDER — CYANOCOBALAMIN 1000 MCG/ML IJ SOLN: 1000.0000 ug | INTRAMUSCULAR | 1 refills | Status: DC

## 2019-06-05 MED FILL — OLOPATADINE HCL 0.2 % SOLN: 0.2 | 25 days supply | Qty: 3 | Fill #0

## 2019-06-07 MED FILL — ALPRAZolam 1 MG TABS: 1 | 30 days supply | Qty: 60 | Fill #4

## 2019-06-07 MED FILL — ESZOPICLONE 3 MG TABS: 3 | 30 days supply | Qty: 30 | Fill #4

## 2019-06-11 ENCOUNTER — Telehealth: Payer: 59 | Admitting: Emergency Medicine

## 2019-06-11 DIAGNOSIS — B9689 Other specified bacterial agents as the cause of diseases classified elsewhere: Secondary | ICD-10-CM

## 2019-06-11 DIAGNOSIS — J019 Acute sinusitis, unspecified: Secondary | ICD-10-CM | POA: Diagnosis not present

## 2019-06-11 MED ORDER — CROMOLYN SODIUM 5.2 MG/ACT NA AERS: 1.0000 | INHALATION_SPRAY | Freq: Four times a day (QID) | NASAL | 0 refills | Status: DC

## 2019-06-11 MED ORDER — AMOXICILLIN-POT CLAVULANATE 875-125 MG PO TABS: 1.0000 | ORAL_TABLET | Freq: Two times a day (BID) | ORAL | 0 refills | Status: DC

## 2019-06-11 MED FILL — AMOX-CLAV 875-125 MG TABLET: 875-125 | 7 days supply | Qty: 14 | Fill #0

## 2019-06-11 MED FILL — AZELASTINE HCL 0.1 % SOLN: 0.1 | 25 days supply | Qty: 30 | Fill #0

## 2019-06-11 NOTE — Progress Notes (Signed)
We are sorry that you are not feeling well.  Here is how we plan to help!  Based on what you have shared with me it looks like you have sinusitis.  Sinusitis is inflammation and infection in the sinus cavities of the head.  Based on your presentation I believe you most likely have Acute Bacterial Sinusitis.  This is an infection caused by bacteria and is treated with antibiotics. I have prescribed Augmentin 875mg /125mg  one tablet twice daily with food, for 7 days.  I have also ordered Cromolyn, 1 spray into both nostrils 4 (four) times daily.   You may use an oral decongestant such as Mucinex D or if you have glaucoma or high blood pressure use plain Mucinex. Saline nasal spray help and can safely be used as often as needed for congestion.  If you develop worsening sinus pain, fever or notice severe headache and vision changes, or if symptoms are not better after completion of antibiotic, please schedule an appointment with a health care provider.    Sinus infections are not as easily transmitted as other respiratory infection, however we still recommend that you avoid close contact with loved ones, especially the very young and elderly.  Remember to wash your hands thoroughly throughout the day as this is the number one way to prevent the spread of infection!  Home Care:  Only take medications as instructed by your medical team.  Complete the entire course of an antibiotic.  Do not take these medications with alcohol.  A steam or ultrasonic humidifier can help congestion.  You can place a towel over your head and breathe in the steam from hot water coming from a faucet.  Avoid close contacts especially the very young and the elderly.  Cover your mouth when you cough or sneeze.  Always remember to wash your hands.  Get Help Right Away If:  You develop worsening fever or sinus pain.  You develop a severe head ache or visual changes.  Your symptoms persist after you have completed your  treatment plan.  Make sure you  Understand these instructions.  Will watch your condition.  Will get help right away if you are not doing well or get worse.  Your e-visit answers were reviewed by a board certified advanced clinical practitioner to complete your personal care plan.  Depending on the condition, your plan could have included both over the counter or prescription medications.  If there is a problem please reply  once you have received a response from your provider.  Your safety is important to [place].  If you have drug allergies check your prescription carefully.    You can use MyChart to ask questions about today's visit, request a non-urgent call back, or ask for a work or school excuse for 24 hours related to this e-Visit. If it has been greater than 24 hours you will need to follow up with your provider, or enter a new e-Visit to address those concerns.  You will get an e-mail in the next two days asking about your experience.  I hope that your e-visit has been valuable and will speed your recovery. Thank you for using e-visits.   Greater than 5 but less than 10 minutes spent researching, coordinating, and implementing care for this patient today

## 2019-06-22 ENCOUNTER — Encounter: Payer: Self-pay | Admitting: Nurse Practitioner

## 2019-06-22 ENCOUNTER — Other Ambulatory Visit: Payer: Self-pay | Admitting: Nurse Practitioner

## 2019-06-22 ENCOUNTER — Other Ambulatory Visit: Payer: Self-pay

## 2019-06-22 ENCOUNTER — Ambulatory Visit: Payer: 59 | Admitting: Nurse Practitioner

## 2019-06-22 VITALS — BP 134/90 | HR 112 | Temp 97.9°F | Wt 199.0 lb

## 2019-06-22 DIAGNOSIS — E1142 Type 2 diabetes mellitus with diabetic polyneuropathy: Secondary | ICD-10-CM

## 2019-06-22 DIAGNOSIS — I10 Essential (primary) hypertension: Secondary | ICD-10-CM

## 2019-06-22 DIAGNOSIS — R7303 Prediabetes: Secondary | ICD-10-CM | POA: Diagnosis not present

## 2019-06-22 DIAGNOSIS — E65 Localized adiposity: Secondary | ICD-10-CM

## 2019-06-22 DIAGNOSIS — E781 Pure hyperglyceridemia: Secondary | ICD-10-CM

## 2019-06-22 DIAGNOSIS — R000 Tachycardia, unspecified: Secondary | ICD-10-CM | POA: Diagnosis not present

## 2019-06-22 MED ORDER — PANTOPRAZOLE SODIUM 40 MG PO TBEC: 40.0000 mg | DELAYED_RELEASE_TABLET | Freq: Every day | ORAL | 1 refills | Status: DC

## 2019-06-22 MED ORDER — LIDOCAINE-PRILOCAINE 2.5-2.5 % EX CREA: 1.0000 "application " | TOPICAL_CREAM | CUTANEOUS | 6 refills | Status: DC | PRN

## 2019-06-22 MED ORDER — GABAPENTIN 600 MG PO TABS: ORAL_TABLET | ORAL | 1 refills | Status: DC

## 2019-06-22 MED ORDER — METFORMIN HCL 1000 MG PO TABS: ORAL_TABLET | ORAL | 1 refills | Status: DC

## 2019-06-22 MED FILL — LIDOCAINE-PRILOCAINE CREAM: 2.5-2.5 | 30 days supply | Qty: 30 | Fill #0

## 2019-06-22 NOTE — Progress Notes (Signed)
   Subjective:    Patient ID: [name], female    DOB: Jul 06, 1970, 49 y.o.   MRN: [id number]  HPI Patient comes in today for a follow up on tachycardia. Patient not having any problems.   Patient would like to discuss recent lab work. States that she continues to have tachycardia but no changes. Continues to get B12 injections. Gabapentin working well for her neuropathic pain overall. Uses "foot cream" sometimes for breakthrough discomfort. Has had both COVID vaccines. Minimal change in weight.   Review of Systems No CP, ischemic type pain or unusual SOB. Continued fatigue but working over 50 hours per week and dealing with aging family members. No abdominal pain. GERD stable with Protonix.     Objective:   Physical Exam NAD. Alert, oriented. Lungs clear. Heart RRR. Significant central obesity particularly in the upper abdominal area.        Assessment & Plan:   Problem List Items Addressed This Visit      Cardiovascular and Mediastinum   Hypertension not at goal     Endocrine   Type 2 diabetes mellitus with diabetic polyneuropathy, without long-term current use of insulin (HCC)   Relevant Medications   metFORMIN (GLUCOPHAGE) 1000 MG tablet   gabapentin (NEURONTIN) 600 MG tablet   Other Relevant Orders   Microalbumin / creatinine urine ratio (Completed)   Hemoglobin A1c (Completed)   Ambulatory referral to Endocrinology     Other   Central obesity   Relevant Medications   metFORMIN (GLUCOPHAGE) 1000 MG tablet   Other Relevant Orders   Ambulatory referral to Endocrinology   Hypertriglyceridemia   Tachycardia - Primary   Relevant Orders   Ambulatory referral to Endocrinology     Meds ordered this encounter  Medications  . metFORMIN (GLUCOPHAGE) 1000 MG tablet    Sig: TAKE 1 TABLET BY MOUTH 2 TIMES DAILY WITH A MEAL.    Dispense:  180 tablet    Refill:  1    Order Specific Question:   Supervising Provider    Answer:   Sallee Lange A [[id number]]  . gabapentin  (NEURONTIN) 600 MG tablet    Sig: TAKE 1 TABLET BY MOUTH 3 TIMES A DAY WITH AN ADDITIONAL TABLET AT BEDTIME.    Dispense:  360 tablet    Refill:  1    Order Specific Question:   Supervising Provider    Answer:   Sallee Lange A [[id number]]  . pantoprazole (PROTONIX) 40 MG tablet    Sig: Take 1 tablet (40 mg total) by mouth daily.    Dispense:  90 tablet    Refill:  1    Order Specific Question:   Supervising Provider    Answer:   Sallee Lange A [[id number]]  . lidocaine-prilocaine (EMLA) cream    Sig: Apply 1 application topically as needed. Apply to feet as needed for nerve pain    Dispense:  30 g    Refill:  6    Order Specific Question:   Supervising Provider    Answer:   Kathyrn Drown [[id number]]   While patient has had central obesity long term, with the significant increase in abdominal girth along with persistent tachycardia minimally improved with medication, patient agrees to referral to endocrinology for evaluation.  Continue efforts with healthy diet and increased activity.  Labs pending.  Return in about 3 months (around 09/21/2019).

## 2019-06-23 LAB — MICROALBUMIN / CREATININE URINE RATIO
Creatinine, Urine: 75.6 mg/dL
Microalb/Creat Ratio: 9 mg/g creat (ref 0–29)
Microalbumin, Urine: 7 ug/mL

## 2019-06-23 LAB — HEMOGLOBIN A1C
Est. average glucose Bld gHb Est-mCnc: 151 mg/dL
Hgb A1c MFr Bld: 6.9 % — ABNORMAL HIGH (ref 4.8–5.6)

## 2019-06-26 MED FILL — CYANOCOBALAMIN 1,000 MCG/ML: 1000 | 90 days supply | Qty: 3 | Fill #0

## 2019-06-27 MED FILL — PATADAY 0.2 % SOLN (OTC): 0.2 | 25 days supply | Qty: 3 | Fill #1

## 2019-07-09 ENCOUNTER — Other Ambulatory Visit: Payer: Self-pay | Admitting: Nurse Practitioner

## 2019-07-09 MED FILL — ESZOPICLONE 3 MG TABS: 3 | 30 days supply | Qty: 30 | Fill #5

## 2019-07-09 MED FILL — ALPRAZolam 1 MG TABS: 1 | 30 days supply | Qty: 60 | Fill #5

## 2019-07-14 ENCOUNTER — Other Ambulatory Visit: Payer: Self-pay | Admitting: Nurse Practitioner

## 2019-07-16 ENCOUNTER — Encounter: Payer: Self-pay | Admitting: Nurse Practitioner

## 2019-07-16 MED FILL — ROSUVASTATIN CALCIUM 5 MG T: 5 | 90 days supply | Qty: 90 | Fill #0

## 2019-07-17 ENCOUNTER — Other Ambulatory Visit: Payer: Self-pay | Admitting: *Deleted

## 2019-07-17 MED ORDER — ONDANSETRON 4 MG PO TBDP
4.0000 mg | ORAL_TABLET | Freq: Three times a day (TID) | ORAL | 0 refills | Status: DC | PRN
Start: 2019-07-17 — End: 2020-01-11

## 2019-07-19 MED FILL — PATADAY 0.2 % SOLN (OTC): 0.2 | 25 days supply | Qty: 3 | Fill #2

## 2019-07-24 MED FILL — OMEGA-3 ETHYL ESTERS 1 GM C: 1 | 30 days supply | Qty: 120 | Fill #2

## 2019-07-24 MED FILL — LIDOCAINE-PRILOCAINE CREAM: 2.5-2.5 | 30 days supply | Qty: 30 | Fill #1

## 2019-07-24 MED FILL — AMITRIPTYLINE HCL 50 MG TAB: 50 | 90 days supply | Qty: 90 | Fill #4

## 2019-07-25 ENCOUNTER — Other Ambulatory Visit: Payer: Self-pay | Admitting: *Deleted

## 2019-07-25 MED ORDER — AMITRIPTYLINE HCL 50 MG PO TABS: 50.0000 mg | ORAL_TABLET | Freq: Every day | ORAL | 0 refills | Status: DC

## 2019-07-26 MED FILL — PANTOPRAZOLE SOD DR 40 MG T: 40 | 90 days supply | Qty: 90 | Fill #0

## 2019-07-27 ENCOUNTER — Other Ambulatory Visit: Payer: Self-pay | Admitting: Nurse Practitioner

## 2019-07-27 MED ORDER — AMITRIPTYLINE HCL 50 MG PO TABS: 50.0000 mg | ORAL_TABLET | Freq: Every day | ORAL | 1 refills | Status: DC

## 2019-08-06 ENCOUNTER — Other Ambulatory Visit: Payer: Self-pay | Admitting: "Endocrinology

## 2019-08-06 ENCOUNTER — Other Ambulatory Visit: Payer: Self-pay

## 2019-08-06 ENCOUNTER — Encounter: Payer: Self-pay | Admitting: "Endocrinology

## 2019-08-06 ENCOUNTER — Ambulatory Visit (INDEPENDENT_AMBULATORY_CARE_PROVIDER_SITE_OTHER): Payer: 59 | Admitting: "Endocrinology

## 2019-08-06 ENCOUNTER — Other Ambulatory Visit: Payer: Self-pay | Admitting: Nurse Practitioner

## 2019-08-06 VITALS — BP 108/72 | HR 112 | Ht 65.0 in | Wt 203.2 lb

## 2019-08-06 DIAGNOSIS — E119 Type 2 diabetes mellitus without complications: Secondary | ICD-10-CM

## 2019-08-06 DIAGNOSIS — E782 Mixed hyperlipidemia: Secondary | ICD-10-CM | POA: Diagnosis not present

## 2019-08-06 DIAGNOSIS — E1165 Type 2 diabetes mellitus with hyperglycemia: Secondary | ICD-10-CM | POA: Insufficient documentation

## 2019-08-06 DIAGNOSIS — E6609 Other obesity due to excess calories: Secondary | ICD-10-CM

## 2019-08-06 DIAGNOSIS — Z6833 Body mass index (BMI) 33.0-33.9, adult: Secondary | ICD-10-CM

## 2019-08-06 MED ORDER — ROSUVASTATIN CALCIUM 10 MG PO TABS: 10.0000 mg | ORAL_TABLET | Freq: Every day | ORAL | 1 refills | Status: DC

## 2019-08-06 NOTE — Patient Instructions (Signed)

## 2019-08-06 NOTE — Progress Notes (Signed)
Endocrinology Consult Note       08/06/2019, 5:15 PM   Subjective:    Patient ID: [name], female    DOB: 1970-08-09.  [name] is being seen in consultation for management of currently uncontrolled symptomatic diabetes requested by  Mikey Kirschner, MD.   Past Medical History:  Diagnosis Date  . Allergy   . Anxiety   . History of migraine headaches   . IBS (irritable bowel syndrome)   . Impaired fasting glucose     Past Surgical History:  Procedure Laterality Date  . ABDOMINAL HYSTERECTOMY  2005   partial  . BLADDER SUSPENSION  2012  . BREAST REDUCTION SURGERY  2002  . CHOLECYSTECTOMY  2005  . EYE SURGERY  1999   laser   . UPPER GASTROINTESTINAL ENDOSCOPY     20 years ago by Dr. Gala Romney. - told she had ulcers    Social History   Socioeconomic History  . Marital status: Married    Spouse name: Not on file  . Number of children: 2  . Years of education: Not on file  . 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 Smoking status: Never Smoker  . Smokeless tobacco: Never Used  Vaping Use  . Vaping Use: Never used  Substance and Sexual Activity  . Alcohol use: Yes    Comment: occasional   . Drug use: Never  . Sexual activity: Not on file  Other Topics Concern  . Not on file  Social History Narrative   Lives at home with husband and dog   Right handed   Caffeine: 3 cups daily   Social Determinants of Health   Financial Resource Strain:   . Difficulty of Paying Living Expenses:   Food Insecurity:   . Worried About Charity fundraiser in the Last Year:   . Arboriculturist in the Last Year:   Transportation Needs:   . Film/video editor (Medical):   Marland Kitchen Lack of Transportation (Non-Medical):   Physical Activity:   . Days of Exercise per Week:   . Minutes of Exercise per  Session:   Stress:   . Feeling of Stress :   Social Connections:   . Frequency of Communication with Friends and Family:   . Frequency of Social Gatherings with Friends and Family:   . Attends Religious Services:   . Active Member of Clubs or Organizations:   . Attends Archivist Meetings:   Marland Kitchen Marital Status:     Family History  Problem Relation Age of Onset  . Diabetes Mother   . Neuropathy Mother   . Heart failure Mother   . Hypertension Mother   . Cancer Mother   . Atrial fibrillation Father   . Hypertension Father   . Colonic polyp Father     Outpatient Encounter Medications as of 08/06/2019  Medication Sig  . Multiple Vitamin (MULTIVITAMIN ADULT PO) Take 1 tablet by mouth daily.  Marland Kitchen ALPRAZolam (XANAX) 1 MG tablet Take 1 tablet (  1 mg total) by mouth 2 (two) times daily as needed. for anxiety  . amitriptyline (ELAVIL) 50 MG tablet Take 1 tablet (50 mg total) by mouth at bedtime. Must be seen for further refills. Call [phone].  . cyanocobalamin (,VITAMIN B-12,) 1000 MCG/ML injection Inject 1 mL (1,000 mcg total) into the muscle every 30 (thirty) days.  . cyclobenzaprine (FLEXERIL) 10 MG tablet TAKE 1 TABLET BY MOUTH AT BEDTIME AS NEEDED FOR MUSCLE SPASMS.  . Eszopiclone 3 MG TABS TAKE 1 TABLET BY MOUTH EVERY EVENING IMMEDIATELY BEFORE BEDTIME  . furosemide (LASIX) 20 MG tablet Take 1 tablet (20 mg total) by mouth daily. (Patient taking differently: Take 20 mg by mouth daily as needed. )  . gabapentin (NEURONTIN) 600 MG tablet TAKE 1 TABLET BY MOUTH 3 TIMES A DAY WITH AN ADDITIONAL TABLET AT BEDTIME.  Marland Kitchen lidocaine-prilocaine (EMLA) cream Apply 1 application topically as needed. Apply to feet as needed for nerve pain  . metFORMIN (GLUCOPHAGE) 1000 MG tablet TAKE 1 TABLET BY MOUTH 2 TIMES DAILY WITH A MEAL.  Marland Kitchen metroNIDAZOLE (METROCREAM) 0.75 % cream Apply topically 2 (two) times daily. To the face prn  . omega-3 acid ethyl esters (LOVAZA) 1 g capsule TAKE 2 CAPSULES (2  G TOTAL) BY MOUTH 2 (TWO) TIMES DAILY.  Marland Kitchen ondansetron (ZOFRAN ODT) 4 MG disintegrating tablet Take 1 tablet (4 mg total) by mouth every 8 (eight) hours as needed for nausea or vomiting (6-8 hours).  . pantoprazole (PROTONIX) 40 MG tablet Take 1 tablet (40 mg total) by mouth daily.  . potassium chloride (K-DUR) 10 MEQ tablet TAKE 2 TABLETS (20 MEQ TOTAL) BY MOUTH DAILY.  Marland Kitchen RESTASIS 0.05 % ophthalmic emulsion PLACE 1 DROP IN [facility] EYE TWICE DAILY  . rosuvastatin (CRESTOR) 10 MG tablet Take 1 tablet (10 mg total) by mouth at bedtime.  . sertraline (ZOLOFT) 100 MG tablet Start with 1/2 tab po for 6 days then one po qd  . [DISCONTINUED] amoxicillin-clavulanate (AUGMENTIN) 875-125 MG tablet Take 1 tablet by mouth 2 (two) times daily. One po bid x 7 days  . [DISCONTINUED] cromolyn (NASALCROM) 5.2 MG/ACT nasal spray Place 1 spray into both nostrils 4 (four) times daily.  . [DISCONTINUED] rosuvastatin (CRESTOR) 5 MG tablet TAKE 1 TABLET BY MOUTH DAILY FOR CHOLESTEROL.  . [DISCONTINUED] Vitamin D, Ergocalciferol, (DRISDOL) 1.25 MG (50000 UT) CAPS capsule Take 1 capsule (50,000 Units total) by mouth every 7 (seven) days.   No facility-administered encounter medications on file as of 08/06/2019.    ALLERGIES: Allergies  Allergen Reactions  . Omnicef [Cefdinir] Other (See Comments)    Patient states antibiotic does not work. Has tried multiple times.  . Losartan Rash    Broke out on face only; no allergic reaction    VACCINATION STATUS: Immunization History  Administered Date(s) Administered  . Moderna SARS-COVID-2 Vaccination 04/17/2019, 05/16/2019  . Td 08/03/2002  . Tdap 11/21/2015    Diabetes She presents for her initial diabetic visit. She has type 2 diabetes mellitus. Onset time: She was diagnosed at approximate age of 36 years. There are no hypoglycemic associated symptoms. Pertinent negatives for hypoglycemia include no confusion, headaches, pallor or seizures. There are no diabetic  associated symptoms. Pertinent negatives for diabetes include no chest pain, no polydipsia, no polyphagia and no polyuria. There are no hypoglycemic complications. Symptoms are stable. Risk factors for coronary artery disease include dyslipidemia, diabetes mellitus, obesity, sedentary lifestyle and family history. Current diabetic treatment includes oral agent (monotherapy). She is compliant with treatment  most of the time. Her weight is increasing steadily. She is following a generally unhealthy diet. When asked about meal planning, she reported none. She has not had a previous visit with a dietitian. She rarely participates in exercise. (She did not bring any logs nor meter with her review.  However, her recent A1c was 6.9% in April  2021.) An ACE inhibitor/angiotensin II receptor blocker is not being taken. She does not see a podiatrist.Eye exam is not current.  Hyperlipidemia This is a chronic problem. The current episode started more than 1 year ago. The problem is uncontrolled. Exacerbating diseases include diabetes and obesity. Pertinent negatives include no chest pain, myalgias or shortness of breath. Current antihyperlipidemic treatment includes statins. Risk factors for coronary artery disease include diabetes mellitus, dyslipidemia, family history, female sex, obesity and a sedentary lifestyle.     Review of Systems  Constitutional: Negative for chills, fever and unexpected weight change.  HENT: Negative for trouble swallowing and voice change.   Eyes: Negative for visual disturbance.  Respiratory: Negative for cough, shortness of breath and wheezing.   Cardiovascular: Negative for chest pain, palpitations and leg swelling.  Gastrointestinal: Negative for diarrhea, nausea and vomiting.  Endocrine: Negative for cold intolerance, heat intolerance, polydipsia, polyphagia and polyuria.  Musculoskeletal: Negative for arthralgias and myalgias.  Skin: Negative for color change, pallor, rash and  wound.  Neurological: Negative for seizures and headaches.  Psychiatric/Behavioral: Negative for confusion and suicidal ideas.    Objective:    Vitals with BMI 08/06/2019 06/22/2019 03/30/2019  Height 5\' 5"  - 5\' 5"   Weight 203 lbs 3 oz 199 lbs 198 lbs  BMI 32.99 - 24.26  Systolic 834 196 222  Diastolic 72 90 92  Pulse 979 112 100    BP 108/72   Pulse (!) 112   Ht 5\' 5"  (1.651 m)   Wt 203 lb 3.2 oz (92.2 kg)   BMI 33.81 kg/m   Wt Readings from Last 3 Encounters:  08/06/19 203 lb 3.2 oz (92.2 kg)  06/22/19 199 lb (90.3 kg)  03/30/19 198 lb (89.8 kg)     Physical Exam Constitutional:      Appearance: She is well-developed.  HENT:     Head: Normocephalic and atraumatic.  Neck:     Thyroid: No thyromegaly.     Trachea: No tracheal deviation.  Cardiovascular:     Rate and Rhythm: Normal rate and regular rhythm.  Pulmonary:     Effort: Pulmonary effort is normal.  Abdominal:     Tenderness: There is no abdominal tenderness. There is no guarding.  Musculoskeletal:        General: Normal range of motion.     Cervical back: Normal range of motion and neck supple.  Skin:    General: Skin is warm and dry.     Coloration: Skin is not pale.     Findings: No erythema or rash.  Neurological:     Mental Status: She is alert and oriented to person, place, and time.     Cranial Nerves: No cranial nerve deficit.     Coordination: Coordination normal.     Deep Tendon Reflexes: Reflexes are normal and symmetric.  Psychiatric:        Judgment: Judgment normal.      CMP ( most recent) CMP     Component Value Date/Time   NA 137 01/19/2019 0843   NA 138 12/01/2018 0850   K 3.9 01/19/2019 0843   CL 102 01/19/2019 0843  CO2 23 01/19/2019 0843   GLUCOSE 131 (H) 01/19/2019 0843   BUN 8 01/19/2019 0843   BUN 8 12/01/2018 0850   CREATININE 0.46 01/19/2019 0843   CALCIUM 8.9 01/19/2019 0843   PROT 6.9 04/20/2019 0936   ALBUMIN 4.2 04/20/2019 0936   AST 79 (H) 04/20/2019 0936    ALT 55 (H) 04/20/2019 0936   ALKPHOS 64 04/20/2019 0936   BILITOT 0.3 04/20/2019 0936   GFRNONAA >60 01/19/2019 0843   GFRAA >60 01/19/2019 0843     Diabetic Labs (most recent): Lab Results  Component Value Date   HGBA1C 6.9 (H) 06/22/2019   HGBA1C 6.8 (H) 01/19/2019   HGBA1C 6.2 (H) 06/13/2018     Lipid Panel ( most recent) Lipid Panel     Component Value Date/Time   CHOL 243 (H) 04/20/2019 0936   TRIG 228 (H) 04/20/2019 0936   HDL 37 (L) 04/20/2019 0936   CHOLHDL 6.6 (H) 04/20/2019 0936   CHOLHDL 5.5 01/19/2019 0843   VLDL 39 01/19/2019 0843   LDLCALC 163 (H) 04/20/2019 0936   LABVLDL 43 (H) 04/20/2019 0936      Lab Results  Component Value Date   TSH 1.575 01/19/2019   TSH 1.340 01/26/2018   TSH 0.648 03/26/2016   TSH 0.088 (L) 02/19/2016   TSH 0.351 11/28/2015   FREET4 0.80 02/19/2016   FREET4 0.78 11/28/2015      Assessment & Plan:   1. Controlled type 2 diabetes mellitus with hyperglycemia (HCC)  - [name] has currently uncontrolled symptomatic type 2 DM since  49 years of age,  with most recent A1c of 6.9 %. Recent labs reviewed. - I had a long discussion with her about the progressive nature of diabetes and the pathology behind its complications. -her diabetes is complicated by obesity/sedentary life and she remains at a high risk for more acute and chronic complications which include CAD, CVA, CKD, retinopathy, and neuropathy. These are all discussed in detail with her.  - I have counseled her on diet  and weight management  by adopting a carbohydrate restricted/protein rich diet. Patient is encouraged to switch to  unprocessed or minimally processed     complex starch and increased protein intake (animal or plant source), fruits, and vegetables. -  she is advised to stick to a routine mealtimes to eat 3 meals  a day and avoid unnecessary snacks ( to snack only to correct hypoglycemia).   - she admits that there is a room for improvement in her  food and drink choices. - Suggestion is made for her to avoid simple carbohydrates  from her diet including Cakes, Sweet Desserts, Ice Cream, Soda (diet and regular), Sweet Tea, Candies, Chips, Cookies, Store Bought Juices, Alcohol in Excess of  1-2 drinks a day, Artificial Sweeteners,  Coffee Creamer, and "Sugar-free" Products. This will help patient to have more stable blood glucose profile and potentially avoid unintended weight gain.  - she will be scheduled with Jearld Fenton, RDN, CDE for diabetes education.  - I have approached her with the following individualized plan to manage  her diabetes and patient agrees:   -Based on her recent labs showing A1c of 6.9%, she will not need insulin treatment for now. -She will continue to benefit from metformin intervention.  She is advised to continue Metformin 1000 mg p.o. twice daily.  Side effects and precautions discussed with her. - she will be considered for incretin therapy as appropriate next visit.  - Specific targets for  A1c;  LDL, HDL,  and Triglycerides were discussed with the patient.  2) Blood Pressure /Hypertension:  her blood pressure is  controlled to target.   she is not on any antihypertensive medications for now.   3) Lipids/Hyperlipidemia:   Review of her recent lipid panel showed uncontrolled  LDL at 163 .  she  is advised to increase Crestor to 10  mg daily at bedtime.  Side effects and precautions discussed with her.  4)  Weight/Diet:  Body mass index is 33.81 kg/m.  -   clearly complicating her diabetes care.   she is  a candidate for weight loss. I discussed with her the fact that loss of 5 - 10% of her  current body weight will have the most impact on her diabetes management.  Exercise, and detailed carbohydrates information provided  -  detailed on discharge instructions.  Her recent thyroid function tests are within normal limits.  5) Chronic Care/Health Maintenance:  -she  is on Statin medications and  is encouraged  to initiate and continue to follow up with Ophthalmology, Dentist,  Podiatrist at least yearly or according to recommendations, and advised to  stay away from smoking. I have recommended yearly flu vaccine and pneumonia vaccine at least every 5 years; moderate intensity exercise for up to 150 minutes weekly; and  sleep for at least 7 hours a day.  - she is  advised to maintain close follow up with Mikey Kirschner, MD for primary care needs, as well as her other providers for optimal and coordinated care.   - Time spent in this patient care: 60 min, of which > 50% was spent in  counseling  her about her type 2 diabetes, hyperlipidemia, obesity and the rest reviewing her blood glucose logs , discussing her hypoglycemia and hyperglycemia episodes, reviewing her current and  previous labs / studies  ( including abstraction from other facilities) and medications  doses and developing a  long term treatment plan based on the latest standards of care/ guidelines; and documenting her care.    Please refer to Patient Instructions for Blood Glucose Monitoring and Insulin/Medications Dosing Guide"  in media tab for additional information. Please  also refer to " Patient Self Inventory" in the Media  tab for reviewed elements of pertinent patient history.  [name] participated in the discussions, expressed understanding, and voiced agreement with the above plans.  All questions were answered to her satisfaction. she is encouraged to contact clinic should she have any questions or concerns prior to her return visit.   Follow up plan: - Return in about 9 weeks (around 10/08/2019) for NV A1c in Office.  Glade Lloyd, MD [facility] [address] [place], [place] [address] Phone: [phone]  Fax: [phone]    08/06/2019, 5:15 PM  This note was partially dictated with voice recognition software. Similar sounding words can be transcribed inadequately  or may not  be corrected upon review.

## 2019-08-07 ENCOUNTER — Encounter: Payer: Self-pay | Admitting: Nurse Practitioner

## 2019-08-07 MED FILL — ALPRAZolam 1 MG TABS: 1 | 30 days supply | Qty: 60 | Fill #0

## 2019-08-08 ENCOUNTER — Other Ambulatory Visit: Payer: Self-pay | Admitting: Nurse Practitioner

## 2019-08-08 ENCOUNTER — Other Ambulatory Visit: Payer: Self-pay | Admitting: *Deleted

## 2019-08-08 MED ORDER — PANTOPRAZOLE SODIUM 40 MG PO TBEC: 40.0000 mg | DELAYED_RELEASE_TABLET | Freq: Every day | ORAL | 1 refills | Status: DC

## 2019-08-13 MED FILL — OLOPATADINE HCL 0.2 % SOLN: 0.2 | 25 days supply | Qty: 3 | Fill #3

## 2019-08-17 ENCOUNTER — Other Ambulatory Visit: Payer: Self-pay | Admitting: Nurse Practitioner

## 2019-08-20 MED FILL — OMEGA-3 ETHYL ESTERS 1 GM C: 1 | 30 days supply | Qty: 120 | Fill #0

## 2019-09-07 ENCOUNTER — Encounter: Payer: Self-pay | Admitting: Nurse Practitioner

## 2019-09-07 ENCOUNTER — Other Ambulatory Visit: Payer: Self-pay

## 2019-09-07 ENCOUNTER — Ambulatory Visit (INDEPENDENT_AMBULATORY_CARE_PROVIDER_SITE_OTHER): Payer: 59 | Admitting: Nurse Practitioner

## 2019-09-07 VITALS — BP 122/74 | Temp 98.0°F | Wt 202.0 lb

## 2019-09-07 DIAGNOSIS — F419 Anxiety disorder, unspecified: Secondary | ICD-10-CM | POA: Diagnosis not present

## 2019-09-07 DIAGNOSIS — E1142 Type 2 diabetes mellitus with diabetic polyneuropathy: Secondary | ICD-10-CM

## 2019-09-07 DIAGNOSIS — Z1329 Encounter for screening for other suspected endocrine disorder: Secondary | ICD-10-CM

## 2019-09-07 DIAGNOSIS — E781 Pure hyperglyceridemia: Secondary | ICD-10-CM | POA: Diagnosis not present

## 2019-09-07 DIAGNOSIS — Z79899 Other long term (current) drug therapy: Secondary | ICD-10-CM

## 2019-09-07 DIAGNOSIS — E782 Mixed hyperlipidemia: Secondary | ICD-10-CM | POA: Diagnosis not present

## 2019-09-07 DIAGNOSIS — F5104 Psychophysiologic insomnia: Secondary | ICD-10-CM | POA: Diagnosis not present

## 2019-09-07 MED ORDER — OZEMPIC (0.25 OR 0.5 MG/DOSE) 2 MG/1.5ML ~~LOC~~ SOPN: PEN_INJECTOR | SUBCUTANEOUS | 0 refills | Status: DC

## 2019-09-07 MED ORDER — VICTOZA 18 MG/3ML ~~LOC~~ SOPN: PEN_INJECTOR | SUBCUTANEOUS | 0 refills | Status: DC

## 2019-09-07 MED ORDER — TRAZODONE HCL 50 MG PO TABS: 25.0000 mg | ORAL_TABLET | Freq: Every evening | ORAL | 2 refills | Status: DC | PRN

## 2019-09-07 MED FILL — traZODone HCL 50 MG TABS: 50 | 30 days supply | Qty: 30 | Fill #0

## 2019-09-07 MED FILL — OZEMPIC 0.25 OR 0.5 MG/DOSE: 2 | 90 days supply | Qty: 5 | Fill #0

## 2019-09-07 MED FILL — OLOPATADINE HCL 0.2 % SOLN: 0.2 | 25 days supply | Qty: 3 | Fill #4

## 2019-09-07 NOTE — Patient Instructions (Addendum)
Sharman Cheek: The Diabetes Code

## 2019-09-07 NOTE — Progress Notes (Signed)
Subjective:    Patient ID: [name], female    DOB: [birth year], 49 y.o.   MRN: [id number]  HPI Pt here for follow up and check on DM. Pt states both feet are starting to hurt more. Pt did see Dr.Nida. Pt not checking sugars often. Dr.Nida told pt to take Metformin as prescribed.  No further work-up was done by endocrinology for any other issues other than diabetes.  Due to the co-pay, patient request that we continue her diabetes care.  Plans to cancel her appointment with Dr. Dorris Fetch.  Was referred to dietitian but could not afford it, it was going to cost $300 co-pay.  Dr. Dorris Fetch increased her Crestor to 10 mg on 6/14. Patient is trying to do better with her diet, does not check her sugar on a regular basis.  Currently on Metformin.  Has had a flareup of the pain in her feet lately.  Her current regimen works well overall with some breakthrough pain. Also having issues with sleeping.  Has tried melatonin but still cannot get to sleep to about 1 AM and has to get up at 530 to get ready for work.  Continues to have significant personal stress.   Review of Systems     Objective:   Physical Exam NAD.  Alert, oriented.  Cheerful mildly anxious affect.  Lungs clear.  Heart regular rate rhythm.  Continues to have significant central adiposity.  Very mild lid lag noted in the right eye. Today's Vitals   09/07/19 1129  BP: 122/74  Temp: 98 F (36.7 C)  Weight: 202 lb (91.6 kg)   Body mass index is 33.61 kg/m. Large waist circumference. Last A1c on 06/22/2019 was 6.9.     Assessment & Plan:   Problem List Items Addressed This Visit      Endocrine   Type 2 diabetes mellitus with diabetic polyneuropathy, without long-term current use of insulin ([place]) - Primary   Relevant Medications   traZODone (DESYREL) 50 MG tablet   Semaglutide,0.25 or 0.5MG /DOS, (OZEMPIC, 0.25 OR 0.5 MG/DOSE,) 2 MG/1.5ML SOPN     Other   Anxiety   Relevant Medications   traZODone (DESYREL) 50 MG tablet   Chronic  insomnia   Hypertriglyceridemia   Relevant Orders   Lipid Profile   Hepatic function panel   TSH   T4, free   Mixed hyperlipidemia    Other Visit Diagnoses    High risk medication use       Relevant Orders   Lipid Profile   Hepatic function panel   TSH   T4, free   Screening for thyroid disorder       Relevant Orders   Lipid Profile   Hepatic function panel   TSH   T4, free     Meds ordered this encounter  Medications  . traZODone (DESYREL) 50 MG tablet    Sig: Take 0.5-1 tablets (25-50 mg total) by mouth at bedtime as needed for sleep.    Dispense:  30 tablet    Refill:  2    Order Specific Question:   Supervising Provider    Answer:   Sallee Lange A [id number]  . DISCONTD: liraglutide (VICTOZA) 18 MG/3ML SOPN    Sig: Start 0.6mg  SQ once a day for 7 days, then increase to 1.2mg  once a day    Dispense:  2 pen    Refill:  0    Order Specific Question:   Supervising Provider    Answer:   Wolfgang Phoenix,  SCOTT A [[id number]]  . Semaglutide,0.25 or 0.5MG /DOS, (OZEMPIC, 0.25 OR 0.5 MG/DOSE,) 2 MG/1.5ML SOPN    Sig: Take 0.25 mg [place] q week x 4 weeks then increase to 0.5 mg [place] q week    Dispense:  3 pen    Refill:  0    Please dispense 90 d supply    Order Specific Question:   Supervising Provider    Answer:   Sallee Lange A [[id number]]   Repeat her labs in 2 to 3 weeks since increasing her Crestor dose.  We will also recheck her thyroid screening.  Initially prescribed Victoza for her diabetes but her pharmacy sent a note.  Ozempic was less expensive.  Start Ozempic as directed, DC med and call if any problems.  Reviewed potential adverse effects.  Note there is no family history of endocrine cancers and patient has no personal history of pancreatitis. Return in about 3 months (around 12/08/2019) for diabetes check up.

## 2019-09-10 ENCOUNTER — Encounter: Payer: Self-pay | Admitting: Nurse Practitioner

## 2019-09-17 MED FILL — OMEGA-3 ETHYL ESTERS 1 GM C: 1 | 30 days supply | Qty: 120 | Fill #1

## 2019-09-17 MED FILL — ROSUVASTATIN CALCIUM 10 MG: 10 | 90 days supply | Qty: 90 | Fill #0

## 2019-09-24 ENCOUNTER — Telehealth: Payer: 59 | Admitting: Family

## 2019-09-24 ENCOUNTER — Other Ambulatory Visit: Payer: Self-pay | Admitting: Family

## 2019-09-24 DIAGNOSIS — J019 Acute sinusitis, unspecified: Secondary | ICD-10-CM | POA: Diagnosis not present

## 2019-09-24 MED ORDER — AMOXICILLIN-POT CLAVULANATE 875-125 MG PO TABS: 1.0000 | ORAL_TABLET | Freq: Two times a day (BID) | ORAL | 0 refills | Status: DC

## 2019-09-24 MED ORDER — BENZONATATE 200 MG PO CAPS
200.0000 mg | ORAL_CAPSULE | Freq: Two times a day (BID) | ORAL | 0 refills | Status: DC | PRN
Start: 2019-09-24 — End: 2020-01-11

## 2019-09-24 MED ORDER — CETIRIZINE HCL 10 MG PO TABS
10.0000 mg | ORAL_TABLET | Freq: Every day | ORAL | 3 refills | Status: DC
Start: 2019-09-24 — End: 2020-03-03

## 2019-09-24 NOTE — Progress Notes (Signed)
We are sorry that you are not feeling well.  Here is how we plan to help!  Based on what you have shared with me it looks like you have sinusitis.  Sinusitis is inflammation and infection in the sinus cavities of the head.  Based on your presentation I believe you most likely have Acute Bacterial Sinusitis.  This is an infection caused by bacteria and is treated with antibiotics. I have prescribed Augmentin 875mg /125mg  one tablet twice daily with food, for 7 days. You may use an oral decongestant such as Mucinex D or if you have glaucoma or high blood pressure use plain Mucinex. Saline nasal spray help and can safely be used as often as needed for congestion.  If you develop worsening sinus pain, fever or notice severe headache and vision changes, or if symptoms are not better after completion of antibiotic, please schedule an appointment with a health care provider.    You also need to start a daily Zyrtec 10 mg.   Sinus infections are not as easily transmitted as other respiratory infection, however we still recommend that you avoid close contact with loved ones, especially the very young and elderly.  Remember to wash your hands thoroughly throughout the day as this is the number one way to prevent the spread of infection!  Home Care:  Only take medications as instructed by your medical team.  Complete the entire course of an antibiotic.  Do not take these medications with alcohol.  A steam or ultrasonic humidifier can help congestion.  You can place a towel over your head and breathe in the steam from hot water coming from a faucet.  Avoid close contacts especially the very young and the elderly.  Cover your mouth when you cough or sneeze.  Always remember to wash your hands.  Get Help Right Away If:  You develop worsening fever or sinus pain.  You develop a severe head ache or visual changes.  Your symptoms persist after you have completed your treatment plan.  Make sure  you  Understand these instructions.  Will watch your condition.  Will get help right away if you are not doing well or get worse.  Your e-visit answers were reviewed by a board certified advanced clinical practitioner to complete your personal care plan.  Depending on the condition, your plan could have included both over the counter or prescription medications.  If there is a problem please reply  once you have received a response from your provider.  Your safety is important to [place].  If you have drug allergies check your prescription carefully.    You can use MyChart to ask questions about today's visit, request a non-urgent call back, or ask for a work or school excuse for 24 hours related to this e-Visit. If it has been greater than 24 hours you will need to follow up with your provider, or enter a new e-Visit to address those concerns.  You will get an e-mail in the next two days asking about your experience.  I hope that your e-visit has been valuable and will speed your recovery. Thank you for using e-visits.  Approximately 5 minutes was spent documenting and reviewing patient's chart.

## 2019-09-24 NOTE — Addendum Note (Signed)
Addended by: Evelina Dun A on: 09/24/2019 06:24 PM   Modules accepted: Orders

## 2019-09-25 MED FILL — AMOX-CLAV 875-125 MG TABLET: 875-125 | 7 days supply | Qty: 14 | Fill #0

## 2019-09-25 MED FILL — BENZONATATE 200 MG CAP: 200 | 10 days supply | Qty: 20 | Fill #0

## 2019-09-25 MED FILL — CETIRIZINE HCL 10 MG TABS: 10 | 90 days supply | Qty: 90 | Fill #0

## 2019-10-01 MED FILL — ALPRAZOLAM 1 MG TABS: 1 | 30 days supply | Qty: 60 | Fill #1

## 2019-10-01 MED FILL — traZODone HCL 50 MG TABS: 50 | 30 days supply | Qty: 30 | Fill #1

## 2019-10-02 MED FILL — OLOPATADINE HCL 0.2 % SOLN: 0.2 | 25 days supply | Qty: 3 | Fill #5

## 2019-10-05 DIAGNOSIS — Z79899 Other long term (current) drug therapy: Secondary | ICD-10-CM | POA: Diagnosis not present

## 2019-10-05 DIAGNOSIS — E781 Pure hyperglyceridemia: Secondary | ICD-10-CM | POA: Diagnosis not present

## 2019-10-05 DIAGNOSIS — Z1329 Encounter for screening for other suspected endocrine disorder: Secondary | ICD-10-CM | POA: Diagnosis not present

## 2019-10-06 ENCOUNTER — Encounter: Payer: Self-pay | Admitting: Nurse Practitioner

## 2019-10-06 LAB — T4, FREE: Free T4: 1.33 ng/dL (ref 0.82–1.77)

## 2019-10-06 LAB — LIPID PANEL
Chol/HDL Ratio: 3.7 ratio (ref 0.0–4.4)
Cholesterol, Total: 146 mg/dL (ref 100–199)
HDL: 40 mg/dL (ref >39)
LDL Chol Calc (NIH): 72 mg/dL (ref 0–99)
Triglycerides: 203 mg/dL — ABNORMAL HIGH (ref 0–149)
VLDL Cholesterol Cal: 34 mg/dL (ref 5–40)

## 2019-10-06 LAB — HEPATIC FUNCTION PANEL
ALT: 49 IU/L — ABNORMAL HIGH (ref 0–32)
AST: 77 IU/L — ABNORMAL HIGH (ref 0–40)
Albumin: 4.8 g/dL (ref 3.8–4.8)
Alkaline Phosphatase: 72 IU/L (ref 48–121)
Bilirubin Total: 0.5 mg/dL (ref 0.0–1.2)
Bilirubin, Direct: 0.18 mg/dL (ref 0.00–0.40)
Total Protein: 7.7 g/dL (ref 6.0–8.5)

## 2019-10-06 LAB — TSH: TSH: 0.744 u[IU]/mL (ref 0.450–4.500)

## 2019-10-08 ENCOUNTER — Ambulatory Visit: Payer: 59 | Admitting: "Endocrinology

## 2019-10-08 MED FILL — CYANOCOBALAMIN 1,000 MCG/ML: 1000 | 90 days supply | Qty: 3 | Fill #1

## 2019-10-14 ENCOUNTER — Encounter: Payer: Self-pay | Admitting: Nurse Practitioner

## 2019-10-15 ENCOUNTER — Other Ambulatory Visit: Payer: Self-pay | Admitting: Nurse Practitioner

## 2019-10-15 MED ORDER — POTASSIUM CHLORIDE CRYS ER 10 MEQ PO TBCR: 10.0000 meq | EXTENDED_RELEASE_TABLET | Freq: Every day | ORAL | 0 refills | Status: DC

## 2019-10-15 MED ORDER — TRAZODONE HCL 100 MG PO TABS: 100.0000 mg | ORAL_TABLET | Freq: Every day | ORAL | 0 refills | Status: DC

## 2019-10-16 MED FILL — traZODone HCL 100 MG TABS: 100 | 90 days supply | Qty: 90 | Fill #0

## 2019-10-16 MED FILL — POTASSIUM CHLORIDE CRYS ER: 10 | 90 days supply | Qty: 90 | Fill #0

## 2019-10-18 MED FILL — PANTOPRAZOLE SOD DR 40 MG T: 40 | 90 days supply | Qty: 90 | Fill #1

## 2019-10-22 ENCOUNTER — Other Ambulatory Visit: Payer: Self-pay | Admitting: Family Medicine

## 2019-10-22 MED FILL — AMITRIPTYLINE HCL 50 MG TAB: 50 | 90 days supply | Qty: 90 | Fill #0

## 2019-10-23 MED FILL — OMEGA-3-ACID ETHYL ESTERS 1: 1 | 30 days supply | Qty: 120 | Fill #0

## 2019-10-30 MED FILL — GABAPENTIN 600 MG TABLET: 600 | 90 days supply | Qty: 360 | Fill #3

## 2019-10-30 MED FILL — LIDOCAINE-PRILOCAINE CREAM: 2.5-2.5 | 30 days supply | Qty: 30 | Fill #2

## 2019-10-30 MED FILL — METFORMIN HCL 1000 MG TABS: 1000 | 90 days supply | Qty: 180 | Fill #1

## 2019-11-16 MED FILL — OMEGA-3-ACID ETHYL ESTERS 1: 1 | 30 days supply | Qty: 120 | Fill #1

## 2019-11-27 MED FILL — ALPRAZOLAM 1 MG TABS: 1 | 30 days supply | Qty: 60 | Fill #2

## 2019-11-30 MED FILL — ROSUVASTATIN CALCIUM 10 MG: 10 | 90 days supply | Qty: 90 | Fill #1

## 2019-12-10 ENCOUNTER — Encounter: Payer: Self-pay | Admitting: Nurse Practitioner

## 2019-12-11 ENCOUNTER — Telehealth (INDEPENDENT_AMBULATORY_CARE_PROVIDER_SITE_OTHER): Payer: Self-pay | Admitting: Gastroenterology

## 2019-12-11 NOTE — Telephone Encounter (Signed)
Patient has an appointment on 11/8  -  States she needs lab work done before she comes in - she uses [facility] - please advise - ph# [phone]

## 2019-12-11 NOTE — Telephone Encounter (Signed)
Please notify patient that I reviewed the labs she had in August 2021 including liver enzymes and do not feel she needs labs prior to visit. Thanks.

## 2019-12-12 NOTE — Telephone Encounter (Signed)
I left [place] a detailed voicemail with recommendation

## 2019-12-14 ENCOUNTER — Telehealth: Payer: 59 | Admitting: Emergency Medicine

## 2019-12-14 ENCOUNTER — Other Ambulatory Visit: Payer: Self-pay | Admitting: Emergency Medicine

## 2019-12-14 DIAGNOSIS — R350 Frequency of micturition: Secondary | ICD-10-CM

## 2019-12-14 MED ORDER — FLUCONAZOLE 150 MG PO TABS
150.0000 mg | ORAL_TABLET | Freq: Once | ORAL | 0 refills | Status: DC
Start: 2019-12-14 — End: 2019-12-14

## 2019-12-14 MED ORDER — NITROFURANTOIN MONOHYD MACRO 100 MG PO CAPS
100.0000 mg | ORAL_CAPSULE | Freq: Two times a day (BID) | ORAL | 0 refills | Status: DC
Start: 2019-12-14 — End: 2020-04-04

## 2019-12-14 MED FILL — FLUCONAZOLE 150 MG TABS: 150 | 5 days supply | Qty: 2 | Fill #0

## 2019-12-14 MED FILL — NITROFURANTOIN MONO-MCR 100: 100 | 5 days supply | Qty: 10 | Fill #0

## 2019-12-14 NOTE — Progress Notes (Signed)
**  Please do not respond to this message unless you have follow up questions.**  We are sorry that you are not feeling well.  Here is how we plan to help!  Based on what you shared with me it looks like you most likely have a simple urinary tract infection.  A UTI (Urinary Tract Infection) is a bacterial infection of the bladder.  Most cases of urinary tract infections are simple to treat but a key part of your care is to encourage you to drink plenty of fluids and watch your symptoms carefully.  I have prescribed MacroBid 100 mg twice a day for 5 days.  I have prescribed Diflucan 150 mg once by mouth, and once 5 days later. Your symptoms should gradually improve. Call us if the burning in your urine worsens, you develop worsening fever, back pain or pelvic pain or if your symptoms do not resolve after completing the antibiotic.  Urinary tract infections can be prevented by drinking plenty of water to keep your body hydrated.  Also be sure when you wipe, wipe from front to back and don't hold it in!  If possible, empty your bladder every 4 hours.  Your e-visit answers were reviewed by a board certified advanced clinical practitioner to complete your personal care plan.  Depending on the condition, your plan could have included both over the counter or prescription medications.  If there is a problem please reply  once you have received a response from your provider.  Your safety is important to [place].  If you have drug allergies check your prescription carefully.    You can use MyChart to ask questions about today's visit, request a non-urgent call back, or ask for a work or school excuse for 24 hours related to this e-Visit. If it has been greater than 24 hours you will need to follow up with your provider, or enter a new e-Visit to address those concerns.   You will get an e-mail in the next two days asking about your experience.  I hope that your e-visit has been valuable and will speed your  recovery. Thank you for using e-visits.    Greater than 5 but less than 10 minutes spent researching, coordinating, and implementing care for this patient today

## 2019-12-17 ENCOUNTER — Other Ambulatory Visit: Payer: Self-pay | Admitting: Family Medicine

## 2019-12-17 MED FILL — OMEGA-3-ACID ETHYL ESTERS 1: 1 | 30 days supply | Qty: 120 | Fill #0

## 2019-12-28 ENCOUNTER — Ambulatory Visit: Payer: 59 | Admitting: Nurse Practitioner

## 2019-12-29 ENCOUNTER — Other Ambulatory Visit: Payer: Self-pay | Admitting: Nurse Practitioner

## 2019-12-31 ENCOUNTER — Ambulatory Visit (INDEPENDENT_AMBULATORY_CARE_PROVIDER_SITE_OTHER): Payer: 59 | Admitting: Gastroenterology

## 2020-01-01 ENCOUNTER — Other Ambulatory Visit: Payer: Self-pay | Admitting: Nurse Practitioner

## 2020-01-01 MED FILL — OZEMPIC 0.25 OR 0.5 MG/DOSE: 2 | 84 days supply | Qty: 5 | Fill #0

## 2020-01-01 MED FILL — ALPRAZOLAM 1 MG TABS: 1 | 30 days supply | Qty: 60 | Fill #0

## 2020-01-04 ENCOUNTER — Encounter: Payer: Self-pay | Admitting: Nurse Practitioner

## 2020-01-04 ENCOUNTER — Other Ambulatory Visit: Payer: Self-pay | Admitting: Nurse Practitioner

## 2020-01-04 MED ORDER — FREESTYLE LIBRE 14 DAY SENSOR MISC: 11 refills | Status: DC

## 2020-01-07 ENCOUNTER — Ambulatory Visit (INDEPENDENT_AMBULATORY_CARE_PROVIDER_SITE_OTHER): Payer: 59 | Admitting: Gastroenterology

## 2020-01-07 DIAGNOSIS — H5213 Myopia, bilateral: Secondary | ICD-10-CM | POA: Diagnosis not present

## 2020-01-07 LAB — HM DIABETES EYE EXAM

## 2020-01-10 ENCOUNTER — Other Ambulatory Visit: Payer: Self-pay | Admitting: Family Medicine

## 2020-01-10 ENCOUNTER — Telehealth: Payer: Self-pay

## 2020-01-10 MED FILL — FREESTYLE LIBRE 14 DAY SENS: 14 days supply | Qty: 1 | Fill #0

## 2020-01-10 MED FILL — OMEGA-3-ACID ETHYL ESTERS 1: 1 | 30 days supply | Qty: 120 | Fill #0

## 2020-01-10 NOTE — Telephone Encounter (Signed)
Patient sent an email with the following information:  "I called [facility] and spoke with Justin and she said to have the PA at your office call the following number and they will walk them through it.  The number is [phone], she said they have faxed it twice to Dr. Wolfgang Phoenix."

## 2020-01-10 NOTE — Telephone Encounter (Signed)
(  FREESTYLE LIBRE 14 DAY SENSOR) MISC   Patient has left 2 messages stating that [place] is faxing a prior auth form for this sensor.  I have looked through all the faxes and [name]'s desk and we don't have anything.  Danae Chen said she hasn't seen anything come in for this.

## 2020-01-10 NOTE — Telephone Encounter (Signed)
Called [facility] and asked to have PA form sent since we never received it. Was told it was sent through cover my meds and I looked at cover my meds and it is not there. Pharm told me they would fax it manually to fax machine. Await fax then will work on it. Pt has appt tomorrow. Pharm states they filled today for a 2 week supply with a coupon.

## 2020-01-11 ENCOUNTER — Ambulatory Visit (INDEPENDENT_AMBULATORY_CARE_PROVIDER_SITE_OTHER): Payer: 59 | Admitting: Nurse Practitioner

## 2020-01-11 ENCOUNTER — Encounter: Payer: Self-pay | Admitting: Nurse Practitioner

## 2020-01-11 ENCOUNTER — Other Ambulatory Visit: Payer: Self-pay | Admitting: Nurse Practitioner

## 2020-01-11 ENCOUNTER — Other Ambulatory Visit: Payer: Self-pay

## 2020-01-11 VITALS — BP 144/92 | HR 116 | Temp 97.6°F | Wt 197.4 lb

## 2020-01-11 DIAGNOSIS — R748 Abnormal levels of other serum enzymes: Secondary | ICD-10-CM

## 2020-01-11 DIAGNOSIS — E559 Vitamin D deficiency, unspecified: Secondary | ICD-10-CM | POA: Diagnosis not present

## 2020-01-11 DIAGNOSIS — E538 Deficiency of other specified B group vitamins: Secondary | ICD-10-CM | POA: Diagnosis not present

## 2020-01-11 DIAGNOSIS — E782 Mixed hyperlipidemia: Secondary | ICD-10-CM | POA: Diagnosis not present

## 2020-01-11 DIAGNOSIS — E1142 Type 2 diabetes mellitus with diabetic polyneuropathy: Secondary | ICD-10-CM

## 2020-01-11 DIAGNOSIS — G629 Polyneuropathy, unspecified: Secondary | ICD-10-CM

## 2020-01-11 DIAGNOSIS — I10 Essential (primary) hypertension: Secondary | ICD-10-CM

## 2020-01-11 LAB — POCT GLYCOSYLATED HEMOGLOBIN (HGB A1C): Hemoglobin A1C: 6.2 % — AB (ref 4.0–5.6)

## 2020-01-11 MED ORDER — AMITRIPTYLINE HCL 50 MG PO TABS: 50.0000 mg | ORAL_TABLET | Freq: Every day | ORAL | 1 refills | Status: DC

## 2020-01-11 MED ORDER — CYCLOBENZAPRINE HCL 10 MG PO TABS: ORAL_TABLET | ORAL | 0 refills | Status: DC

## 2020-01-11 MED FILL — CYCLOBENZAPRINE HCL 10 MG T: 10 | 30 days supply | Qty: 30 | Fill #0

## 2020-01-11 MED FILL — AMITRIPTYLINE HCL 50 MG TAB: 50 | 90 days supply | Qty: 90 | Fill #0

## 2020-01-11 NOTE — Progress Notes (Signed)
Subjective:    Patient ID: [name], female    DOB: September 21, 1970, 49 y.o.   MRN: [id number]  Diabetes She presents for her follow-up diabetic visit. She has type 2 diabetes mellitus. Current diabetic treatments: Metformin, ozempic.  Adherent to medication regimen.  Has chronic bilateral lower extremity neuropathy. Has seen neurology in the past. Was told this was due to her diabetes. Blood sugars that she has brought in today: sugars mid 100's; only one 204 reading today. Has been trying to do better with her diet. Limited activity.    Review of Systems General: Denies fever, HA,  CV: Denies chest pain, palpitations, tachycardia Pulm: Denies cough, SOB, wheezing GI/GU: Denies upset stomach, nausea, vomiting, diarrhea, constipation. Denies urinary urgency, frequency, dysuria, pelvic or flank pain.  MSK: Complains of increased nerve pain in her L great toe. She is on gabapentin and using lidocaine cream to the site regularly.     Objective:   Physical Exam General: Patient is alert, well groomed, making good eye contact, with coherent thought and judgement. CV: Hearts sounds are normal without murmur. Regular rate and rhythm. Skin is cool and dry with +1 pitting edema in her lower extremities.  Pulm: Breath sounds are equal and clear bilaterally without wheezing or rhonchi.  Feet: Skin is intact without ulcers or lesions. DP pulses 2+ bilaterally. Sensation is decreased bilaterally.  GI/GU: Abdomen is flat and soft without mass or tenderness  Diabetic Foot Exam - Simple   Simple Foot Form Diabetic Foot exam was performed with the following findings: Yes 01/11/2020  2:14 PM  Visual Inspection No deformities, no ulcerations, no other skin breakdown bilaterally: Yes Sensation Testing See comments: Yes Pulse Check Posterior Tibialis and Dorsalis pulse intact bilaterally: Yes Comments Patient with decreased sensation throughout her foot and ankle bilaterally. Reports numbness and  tingling throughout, with increased nerve pain in her greater toes. L>R.    Results for orders placed or performed in visit on 01/11/20  POCT glycosylated hemoglobin (Hb A1C)  Result Value Ref Range   Hemoglobin A1C 6.2 (A) 4.0 - 5.6 %   HbA1c POC (<> result, manual entry)     HbA1c, POC (prediabetic range)     HbA1c, POC (controlled diabetic range)         Assessment & Plan:   Problem List Items Addressed This Visit      Cardiovascular and Mediastinum   Hypertension not at goal     Endocrine   Type 2 diabetes mellitus with diabetic polyneuropathy, without long-term current use of insulin (HCC) - Primary   Relevant Medications   amitriptyline (ELAVIL) 50 MG tablet   cyclobenzaprine (FLEXERIL) 10 MG tablet   Other Relevant Orders   POCT glycosylated hemoglobin (Hb A1C) (Completed)     Nervous and Auditory   Peripheral polyneuropathy   Relevant Medications   amitriptyline (ELAVIL) 50 MG tablet   cyclobenzaprine (FLEXERIL) 10 MG tablet     Other   Vitamin D deficiency   Elevated liver enzymes   B12 deficiency   Mixed hyperlipidemia     Meds ordered this encounter  Medications  . amitriptyline (ELAVIL) 50 MG tablet    Sig: Take 1 tablet (50 mg total) by mouth at bedtime.    Dispense:  90 tablet    Refill:  1    Order Specific Question:   Supervising Provider    Answer:   Sallee Lange A [[id number]]  . cyclobenzaprine (FLEXERIL) 10 MG tablet    Sig: TAKE  1 TABLET BY MOUTH AT BEDTIME AS NEEDED FOR MUSCLE SPASMS.    Dispense:  30 tablet    Refill:  0    Order Specific Question:   Supervising Provider    Answer:   Sallee Lange A [[id number]]   Discussed diabetes education classes. Working on authorization for the [employer], encouraged patient to continue monitoring blood sugars with finger sticks in the meantime. Continue with current medication regimen. We will recheck lab work in 3 months.   Discussed healthy diet and regular activity.  Return in about 3 months (around  04/12/2020) for Diabetes check up.

## 2020-01-12 ENCOUNTER — Encounter: Payer: Self-pay | Admitting: Nurse Practitioner

## 2020-01-12 NOTE — Progress Notes (Signed)
   Subjective:    Patient ID: [name], female    DOB: September 14, 1970, 49 y.o.   MRN: [id number]  HPI    Review of Systems     Objective:   Physical Exam        Assessment & Plan:

## 2020-01-15 ENCOUNTER — Telehealth: Payer: Self-pay

## 2020-01-15 NOTE — Telephone Encounter (Signed)
Patient is wanting to know if freestyle [name] has been authorized through her insurance yet because she had heard anything. Also wanting to know if she could get another kit if its not been done yet. Please advise

## 2020-01-15 NOTE — Telephone Encounter (Addendum)
Insurance will only cover if on insulin. Patient notified and pharmacy advised of a discount program and [place] at [facility] will contact patient with further details.

## 2020-01-16 ENCOUNTER — Telehealth: Payer: Self-pay

## 2020-01-16 ENCOUNTER — Other Ambulatory Visit: Payer: Self-pay

## 2020-01-16 MED ORDER — PANTOPRAZOLE SODIUM 40 MG PO TBEC
40.0000 mg | DELAYED_RELEASE_TABLET | Freq: Every day | ORAL | 1 refills | Status: DC
Start: 2020-01-16 — End: 2020-07-22

## 2020-01-16 MED ORDER — FREESTYLE LIBRE 14 DAY SENSOR MISC
11 refills | Status: DC
Start: 2020-01-16 — End: 2020-09-05

## 2020-01-16 MED FILL — PANTOPRAZOLE SOD DR 40 MG T: 40 | 90 days supply | Qty: 90 | Fill #0

## 2020-01-16 NOTE — Telephone Encounter (Signed)
Patient given the number for the discount card for [facility]404 [phone]. Patient verbalized understanding.

## 2020-01-16 NOTE — Telephone Encounter (Signed)
[name] is wanting [facility] to call her back   Pt call back [phone]

## 2020-01-17 NOTE — Telephone Encounter (Signed)
Still working on [place]. A sample sensor was provided last week at visit.

## 2020-01-18 ENCOUNTER — Other Ambulatory Visit: Payer: Self-pay | Admitting: Nurse Practitioner

## 2020-01-18 MED FILL — CETIRIZINE HCL 10 MG TABS: 10 | 90 days supply | Qty: 90 | Fill #1

## 2020-01-18 MED FILL — FREESTYLE LIBRE 14 DAY SENS: 28 days supply | Qty: 2 | Fill #1

## 2020-01-18 MED FILL — LIDOCAINE-PRILOCAINE CREAM: 2.5-2.5 | 30 days supply | Qty: 30 | Fill #3

## 2020-01-21 ENCOUNTER — Other Ambulatory Visit: Payer: Self-pay | Admitting: Family Medicine

## 2020-01-21 MED FILL — METFORMIN HCL 1000 MG TABS: 1000 | 90 days supply | Qty: 180 | Fill #0

## 2020-01-22 ENCOUNTER — Other Ambulatory Visit (HOSPITAL_COMMUNITY): Payer: Self-pay | Admitting: Osteopathic Medicine

## 2020-01-22 NOTE — Telephone Encounter (Signed)
Not sure if it is covered 100%. Do you want me to put in a referral?

## 2020-01-24 MED FILL — GABAPENTIN 600 MG TABLET: 600 | 90 days supply | Qty: 360 | Fill #0

## 2020-01-24 MED FILL — TYRVAYA 0.03 MG/ACT SOLN: 0.03 | 30 days supply | Qty: 8 | Fill #0

## 2020-02-04 ENCOUNTER — Other Ambulatory Visit: Payer: Self-pay | Admitting: Family Medicine

## 2020-02-06 ENCOUNTER — Other Ambulatory Visit: Payer: Self-pay | Admitting: Family Medicine

## 2020-02-06 ENCOUNTER — Encounter: Payer: Self-pay | Admitting: Nurse Practitioner

## 2020-02-06 MED ORDER — OMEGA-3-ACID ETHYL ESTERS 1 G PO CAPS: ORAL_CAPSULE | ORAL | 5 refills | Status: DC

## 2020-02-06 MED FILL — OMEGA-3-ACID ETHYL ESTERS 1: 1 | 30 days supply | Qty: 120 | Fill #0

## 2020-02-11 MED FILL — FREESTYLE LIBRE 14 DAY SENS: 28 days supply | Qty: 2 | Fill #2

## 2020-02-12 ENCOUNTER — Other Ambulatory Visit: Payer: Self-pay | Admitting: Family Medicine

## 2020-02-26 ENCOUNTER — Other Ambulatory Visit: Payer: Self-pay | Admitting: Nurse Practitioner

## 2020-02-26 ENCOUNTER — Telehealth: Payer: 59 | Admitting: Nurse Practitioner

## 2020-02-26 DIAGNOSIS — J0100 Acute maxillary sinusitis, unspecified: Secondary | ICD-10-CM

## 2020-02-26 MED ORDER — AMOXICILLIN-POT CLAVULANATE 875-125 MG PO TABS: 1.0000 | ORAL_TABLET | Freq: Two times a day (BID) | ORAL | 0 refills | Status: DC

## 2020-02-26 MED FILL — AMOX-CLAV 875-125 MG TABLET: 875-125 | 7 days supply | Qty: 14 | Fill #0

## 2020-02-26 NOTE — Progress Notes (Signed)

## 2020-03-02 MED FILL — ALPRAZOLAM 1 MG TABS: 1 | 30 days supply | Qty: 60 | Fill #1

## 2020-03-03 ENCOUNTER — Encounter: Payer: Self-pay | Admitting: Nurse Practitioner

## 2020-03-03 ENCOUNTER — Other Ambulatory Visit: Payer: Self-pay | Admitting: "Endocrinology

## 2020-03-03 ENCOUNTER — Other Ambulatory Visit: Payer: Self-pay

## 2020-03-03 ENCOUNTER — Ambulatory Visit (INDEPENDENT_AMBULATORY_CARE_PROVIDER_SITE_OTHER): Payer: 59 | Admitting: Family Medicine

## 2020-03-03 ENCOUNTER — Encounter: Payer: Self-pay | Admitting: Family Medicine

## 2020-03-03 ENCOUNTER — Other Ambulatory Visit: Payer: Self-pay | Admitting: Nurse Practitioner

## 2020-03-03 VITALS — BP 126/82 | HR 92 | Temp 97.9°F | Ht 65.0 in | Wt 199.8 lb

## 2020-03-03 DIAGNOSIS — R0981 Nasal congestion: Secondary | ICD-10-CM | POA: Insufficient documentation

## 2020-03-03 DIAGNOSIS — H60501 Unspecified acute noninfective otitis externa, right ear: Secondary | ICD-10-CM | POA: Diagnosis not present

## 2020-03-03 DIAGNOSIS — J302 Other seasonal allergic rhinitis: Secondary | ICD-10-CM

## 2020-03-03 MED ORDER — MONTELUKAST SODIUM 10 MG PO TABS: ORAL_TABLET | ORAL | 0 refills | Status: DC

## 2020-03-03 MED ORDER — ACETIC ACID 2 % OT SOLN: 4.0000 [drp] | Freq: Four times a day (QID) | OTIC | 0 refills | Status: DC

## 2020-03-03 NOTE — Progress Notes (Signed)
Patient ID: [name], female    DOB: 03-07-1970, 50 y.o.   MRN: [id number]   Chief Complaint  Patient presents with  . Nasal Congestion    Right ear stopped up- had e visit and got Augmentin but nose still stopped up and ear feels stopped up   Subjective:  CC: nasal congestion, ear stopped up.  This is not a new problem.  Presents today with a complaint of "fluid in the ears "reports that her hearing is decreased.  Symptoms have been present for 2 weeks.  On January 4 she had an e- visit, diagnosed with rhinosinusitis, given Augmentin, continues to take Augmentin.  Reports that Augmentin will be completed on Wednesday.  She denies fever, chills.  Associated symptoms include congestion, sinus pressure and seasonal allergies.    Medical History [name] has a past medical history of Allergy, Anxiety, History of migraine headaches, IBS (irritable bowel syndrome), and Impaired fasting glucose.   Outpatient Encounter Medications as of 03/03/2020  Medication Sig  . acetic acid 2 % otic solution Place 4 drops into the right ear 4 (four) times daily.  . montelukast (SINGULAIR) 10 MG tablet Take one tablet by mouth daily.  Marland Kitchen ALPRAZolam (XANAX) 1 MG tablet TAKE 1 TABLET BY MOUTH TWO TIMES DAILY AS NEEDED FOR ANXIETY.  Marland Kitchen amitriptyline (ELAVIL) 50 MG tablet Take 1 tablet (50 mg total) by mouth at bedtime.  Marland Kitchen amoxicillin-clavulanate (AUGMENTIN) 875-125 MG tablet Take 1 tablet by mouth 2 (two) times daily.  . Continuous Blood Gluc Sensor (FREESTYLE LIBRE 14 DAY SENSOR) MISC Apply as directed every 14 days.  . cyanocobalamin (,VITAMIN B-12,) 1000 MCG/ML injection Inject 1 mL (1,000 mcg total) into the muscle every 30 (thirty) days.  . cyclobenzaprine (FLEXERIL) 10 MG tablet TAKE 1 TABLET BY MOUTH AT BEDTIME AS NEEDED FOR MUSCLE SPASMS.  Marland Kitchen gabapentin (NEURONTIN) 600 MG tablet TAKE 1 TABLET BY MOUTH 3 TIMES A DAY WITH AN ADDITIONAL TABLET AT BEDTIME.  Marland Kitchen lidocaine-prilocaine (EMLA) cream Apply 1  application topically as needed. Apply to feet as needed for nerve pain  . metFORMIN (GLUCOPHAGE) 1000 MG tablet TAKE 1 TABLET BY MOUTH TWO TIMES DAILY WITH MEALS  . metroNIDAZOLE (METROCREAM) 0.75 % cream Apply topically 2 (two) times daily. To the face prn  . nitrofurantoin, macrocrystal-monohydrate, (MACROBID) 100 MG capsule Take 1 capsule (100 mg total) by mouth 2 (two) times daily.  Marland Kitchen omega-3 acid ethyl esters (LOVAZA) 1 g capsule TAKE 2 CAPSULES BY MOUTH TWO TIMES DAILY  . pantoprazole (PROTONIX) 40 MG tablet Take 1 tablet (40 mg total) by mouth daily.  . RESTASIS 0.05 % ophthalmic emulsion PLACE 1 DROP IN EACH EYE TWICE DAILY  . rosuvastatin (CRESTOR) 10 MG tablet Take 1 tablet (10 mg total) by mouth at bedtime.  . Semaglutide,0.25 or 0.5MG /DOS, (OZEMPIC, 0.25 OR 0.5 MG/DOSE,) 2 MG/1.5ML SOPN TAKE 0.25 MG UNDER THE SKIN ONCE A WEEK FOR 4 WEEKS THEN INCREASE TO 0.5 MG UNDER THE SKIN WEEKLY  . [DISCONTINUED] cetirizine (ZYRTEC) 10 MG tablet Take 1 tablet (10 mg total) by mouth daily.  . [DISCONTINUED] potassium chloride (KLOR-CON) 10 MEQ tablet Take 1 tablet (10 mEq total) by mouth daily.  . [DISCONTINUED] traZODone (DESYREL) 100 MG tablet Take 1 tablet (100 mg total) by mouth at bedtime. (Patient not taking: Reported on 01/11/2020)   No facility-administered encounter medications on file as of 03/03/2020.     Review of Systems  Constitutional: Negative for chills and fever.  HENT: Positive for  congestion, sinus pressure and sinus pain. Negative for ear pain, postnasal drip and rhinorrhea.   Respiratory: Negative for shortness of breath.   Cardiovascular: Negative for chest pain.  Gastrointestinal: Negative for abdominal pain, diarrhea, nausea and vomiting.  Allergic/Immunologic: Positive for environmental allergies.  Hematological: Positive for adenopathy.     Vitals BP 126/82   Pulse 92   Temp 97.9 F (36.6 C) (Oral)   Ht 5\' 5"  (1.651 m)   Wt 199 lb 12.8 oz (90.6 kg)   SpO2  99%   BMI 33.25 kg/m   Objective:   Physical Exam Vitals reviewed.  Constitutional:      General: She is not in acute distress.    Appearance: Normal appearance.  HENT:     Right Ear: Tympanic membrane normal. No mastoid tenderness.     Left Ear: Tympanic membrane normal. No mastoid tenderness.     Nose:     Right Sinus: Maxillary sinus tenderness present. No frontal sinus tenderness.     Left Sinus: Maxillary sinus tenderness present. No frontal sinus tenderness.  Cardiovascular:     Rate and Rhythm: Normal rate and regular rhythm.     Heart sounds: Normal heart sounds.  Pulmonary:     Effort: Pulmonary effort is normal.     Breath sounds: Normal breath sounds.  Skin:    General: Skin is warm and dry.  Neurological:     General: No focal deficit present.     Mental Status: She is alert.  Psychiatric:        Behavior: Behavior normal.      Assessment and Plan   1. Acute otitis externa of right ear, unspecified type - acetic acid 2 % otic solution; Place 4 drops into the right ear 4 (four) times daily.  Dispense: 15 mL; Refill: 0  2. Seasonal allergic rhinitis, unspecified trigger - montelukast (SINGULAIR) 10 MG tablet; Take one tablet by mouth daily.  Dispense: 30 tablet; Refill: 0  3. Nasal congestion - Novel Coronavirus, NAA ([facility])   Continues to have maxillary sinus pain and pressure, TMs normal today, possibly eustachian tube dysfunction.  Has been on Zyrtec and OTC Nasacort.  Will change allergy medication and prescribe some eardrops for her stopped up ears.  She is instructed to use saline nasal flushes prior to Nasacort.  She will continue the Augmentin, which will be completed this Wednesday.  Agrees with plan of care discussed today. Understands warning signs to seek further care: Chest pain, shortness of breath, any significant change in health. Understands to follow-up in 1 month, this visit can be virtual, to ensure effectiveness of Singulair.  She  will follow-up sooner if needed.   Audrey Ades, FNP-C

## 2020-03-03 NOTE — Patient Instructions (Signed)
Nasal flushes with saline. Then Nasocort Take Singulair daily instead of Zyrtec.       Allergic Rhinitis, Adult Allergic rhinitis is a reaction to allergens. Allergens are things that can cause an allergic reaction. This condition affects the lining inside the nose (mucous membrane). There are two types of allergic rhinitis:  Seasonal. This type is also called hay fever. It happens only during some times of the year.  Perennial. This type can happen at any time of the year. This condition cannot be spread from person to person (is not contagious). It can be mild, worse, or very bad. It can develop at any age and may be outgrown. What are the causes? This condition may be caused by:  Pollen from grasses, trees, and weeds.  Dust mites.  Smoke.  Mold.  Car fumes.  The pee (urine), spit, or dander of pets. Dander is dead skin cells from a pet.   What increases the risk? You are more likely to develop this condition if:  You have allergies in your family.  You have problems like allergies in your family. You may have: ? Swelling of parts of your eyes and eyelids. ? Asthma. This affects how you breathe. ? Long-term redness and swelling on your skin. ? Food allergies. What are the signs or symptoms? The main symptom of this condition is a runny or stuffy nose (nasal congestion). Other symptoms may include:  Sneezing or coughing.  Itching and tearing of your eyes.  Mucus that drips down the back of your throat (postnasal drip).  Trouble sleeping.  Feeling tired.  Headache.  Sore throat. How is this treated? There is no cure for this condition. You should avoid things that you are allergic to. Treatment can help to relieve symptoms. This may include:  Medicines that block allergy symptoms, such as corticosteroids or antihistamines. These may be given as a shot, nasal spray, or pill.  Avoiding things you are allergic to.  Medicines that give you bits of what you  are allergic to over time. This is called immunotherapy. It is done if other treatments do not help. You may get: ? Shots. ? Medicine under your tongue.  Stronger medicines, if other treatments do not help. Follow these instructions at home: Avoiding allergens Find out what things you are allergic to and avoid them. To do this, try these things:  If you get allergies any time of year: ? Replace carpet with wood, tile, or vinyl flooring. Carpet can trap pet dander and dust. ? Do not smoke. Do not allow smoking in your home. ? Change your heating and air conditioning filters at least once a month.  If you get allergies only some times of the year: ? Keep windows closed when you can. ? Plan things to do outside when pollen counts are lowest. Check pollen counts before you plan things to do outside. ? When you come indoors, change your clothes and shower before you sit on furniture or bedding.   If you are allergic to a pet: ? Keep the pet out of your bedroom. ? Vacuum, sweep, and dust often.   General instructions  Take over-the-counter and prescription medicines only as told by your doctor.  Drink enough fluid to keep your pee (urine) pale yellow.  Keep all follow-up visits as told by your doctor. This is important. Where to find more information  American Academy of Allergy, Asthma & Immunology: www.aaaai.org Contact a doctor if:  You have a fever.  You get  a cough that does not go away.  You make whistling sounds when you breathe (wheeze).  Your symptoms slow you down.  Your symptoms stop you from doing your normal things each day. Get help right away if:  You are short of breath. This symptom may be an emergency. Do not wait to see if the symptom will go away. Get medical help right away. Call your local emergency services (911 in the [place] Do not drive yourself to the hospital. Summary  Allergic rhinitis may be treated by taking medicines and avoiding things you  are allergic to.  If you have allergies only some of the year, keep windows closed when you can at those times.  Contact your doctor if you get a fever or a cough that does not go away. This information is not intended to replace advice given to you by your health care provider. Make sure you discuss any questions you have with your health care provider. Document Revised: 04/02/2019 Document Reviewed: 02/06/2019 Elsevier Patient Education  2021 [employer].

## 2020-03-04 ENCOUNTER — Other Ambulatory Visit: Payer: Self-pay | Admitting: Nurse Practitioner

## 2020-03-04 MED ORDER — ROSUVASTATIN CALCIUM 10 MG PO TABS: 10.0000 mg | ORAL_TABLET | Freq: Every day | ORAL | 1 refills | Status: DC

## 2020-03-04 MED FILL — ROSUVASTATIN CALCIUM 10 MG: 10 | 90 days supply | Qty: 90 | Fill #0

## 2020-03-05 LAB — SPECIMEN STATUS REPORT

## 2020-03-05 LAB — SARS-COV-2, NAA 2 DAY TAT

## 2020-03-05 LAB — NOVEL CORONAVIRUS, NAA: SARS-CoV-2, NAA: NOT DETECTED

## 2020-03-10 MED FILL — FREESTYLE LIBRE 14 DAY SENS: 28 days supply | Qty: 2 | Fill #3

## 2020-03-12 MED FILL — LIDOCAINE-PRILOCAINE CREAM: 2.5-2.5 | 30 days supply | Qty: 30 | Fill #4

## 2020-03-13 ENCOUNTER — Encounter: Payer: Self-pay | Admitting: Nurse Practitioner

## 2020-03-13 DIAGNOSIS — E538 Deficiency of other specified B group vitamins: Secondary | ICD-10-CM | POA: Diagnosis not present

## 2020-03-13 DIAGNOSIS — E1142 Type 2 diabetes mellitus with diabetic polyneuropathy: Secondary | ICD-10-CM | POA: Diagnosis not present

## 2020-03-13 DIAGNOSIS — E559 Vitamin D deficiency, unspecified: Secondary | ICD-10-CM | POA: Diagnosis not present

## 2020-03-13 DIAGNOSIS — E782 Mixed hyperlipidemia: Secondary | ICD-10-CM | POA: Diagnosis not present

## 2020-03-13 DIAGNOSIS — I10 Essential (primary) hypertension: Secondary | ICD-10-CM | POA: Diagnosis not present

## 2020-03-13 MED FILL — OMEGA-3-ACID ETHYL ESTERS 1: 1 | 30 days supply | Qty: 120 | Fill #1

## 2020-03-14 LAB — VITAMIN D 25 HYDROXY (VIT D DEFICIENCY, FRACTURES): Vit D, 25-Hydroxy: 30.9 ng/mL (ref 30.0–100.0)

## 2020-03-14 LAB — CMP14+EGFR
ALT: 28 IU/L (ref 0–32)
AST: 38 IU/L (ref 0–40)
Albumin/Globulin Ratio: 2 (ref 1.2–2.2)
Albumin: 4.4 g/dL (ref 3.8–4.8)
Alkaline Phosphatase: 57 IU/L (ref 44–121)
BUN/Creatinine Ratio: 10 (ref 9–23)
BUN: 6 mg/dL (ref 6–24)
Bilirubin Total: 0.3 mg/dL (ref 0.0–1.2)
CO2: 27 mmol/L (ref 20–29)
Calcium: 9.3 mg/dL (ref 8.7–10.2)
Chloride: 99 mmol/L (ref 96–106)
Creatinine, Ser: 0.61 mg/dL (ref 0.57–1.00)
GFR calc Af Amer: 122 mL/min/{1.73_m2} (ref >59)
GFR calc non Af Amer: 106 mL/min/{1.73_m2} (ref >59)
Globulin, Total: 2.2 g/dL (ref 1.5–4.5)
Glucose: 109 mg/dL — ABNORMAL HIGH (ref 65–99)
Potassium: 4.2 mmol/L (ref 3.5–5.2)
Sodium: 141 mmol/L (ref 134–144)
Total Protein: 6.6 g/dL (ref 6.0–8.5)

## 2020-03-14 LAB — HEMOGLOBIN A1C
Est. average glucose Bld gHb Est-mCnc: 146 mg/dL
Hgb A1c MFr Bld: 6.7 % — ABNORMAL HIGH (ref 4.8–5.6)

## 2020-03-14 LAB — LIPID PANEL
Chol/HDL Ratio: 2.9 ratio (ref 0.0–4.4)
Cholesterol, Total: 123 mg/dL (ref 100–199)
HDL: 43 mg/dL (ref >39)
LDL Chol Calc (NIH): 57 mg/dL (ref 0–99)
Triglycerides: 132 mg/dL (ref 0–149)
VLDL Cholesterol Cal: 23 mg/dL (ref 5–40)

## 2020-03-14 LAB — VITAMIN B12: Vitamin B-12: 559 pg/mL (ref 232–1245)

## 2020-03-24 ENCOUNTER — Telehealth: Payer: Self-pay

## 2020-03-24 NOTE — Telephone Encounter (Signed)
Called patient to advise. She will call Dr. Jaynee Eagles.

## 2020-03-24 NOTE — Telephone Encounter (Signed)
I read through the notes of Dr. Jaynee Eagles, Amy Lomax and Dr. 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 Jaynee Eagles then replied if EMG/NCS considered she would need to see her in consultation to discuss since it was 2019.   I'm not sure what my role would be here?

## 2020-03-24 NOTE — Telephone Encounter (Signed)
Patient saw Dr Jaynee Eagles on 09/20/2017, and was dx with neuropathy. Was recommended to have an EMG, and she held off until more of deductible met.  Saw Amy Lomax 11/28/2018- and was told don't do the EMG.  She did not want to f/u with them any longer, so she still wants to have the EMG Bil LE's done.  I told her that Dr Ernestina Patches Jaquitta Dupriest be able to help.  Can you have him review notes and advise?  She does have an appt with Pearson Forster, NP at Dr Lance Sell office on 04/04/2020, and can get a referral if needed. Or if Dr Ernestina Patches recommends other options, please advise.  Patient was just calling to schedule herself as a NP.  [facility] is not referring her.  Thank you!

## 2020-03-24 NOTE — Telephone Encounter (Signed)
Please advise 

## 2020-03-24 NOTE — Telephone Encounter (Signed)
Please advise on what this patient is being referred for.

## 2020-03-24 NOTE — Telephone Encounter (Signed)
Patient called she wants to schedule a appointment with Dr.Newton she sated she has neuropathy her toes she will be filing with [facility] she was referred by Abigail Butts . HQ:[phone]  DIYM:[phone]

## 2020-03-27 ENCOUNTER — Telehealth: Payer: Self-pay | Admitting: Family Medicine

## 2020-03-27 ENCOUNTER — Encounter: Payer: Self-pay | Admitting: Nurse Practitioner

## 2020-03-27 MED FILL — TYRVAYA 0.03 MG/ACT SOLN: 0.03 | 30 days supply | Qty: 8 | Fill #1

## 2020-03-27 NOTE — Telephone Encounter (Signed)
Got it. She's scheduled with her.

## 2020-03-27 NOTE — Telephone Encounter (Signed)
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  Also pt insisted on seeing Dr Jaynee Eagles stating she did not want to see Amy,NP

## 2020-03-28 ENCOUNTER — Other Ambulatory Visit: Payer: Self-pay | Admitting: Nurse Practitioner

## 2020-03-28 DIAGNOSIS — E1142 Type 2 diabetes mellitus with diabetic polyneuropathy: Secondary | ICD-10-CM

## 2020-03-31 MED FILL — OZEMPIC 0.25 OR 0.5 MG/DOSE: 2 | 84 days supply | Qty: 5 | Fill #1

## 2020-04-04 ENCOUNTER — Encounter: Payer: Self-pay | Admitting: Nurse Practitioner

## 2020-04-04 ENCOUNTER — Ambulatory Visit: Payer: 59 | Admitting: Family Medicine

## 2020-04-04 ENCOUNTER — Other Ambulatory Visit: Payer: Self-pay | Admitting: Nurse Practitioner

## 2020-04-04 ENCOUNTER — Ambulatory Visit: Payer: 59 | Admitting: Nurse Practitioner

## 2020-04-04 ENCOUNTER — Other Ambulatory Visit: Payer: Self-pay

## 2020-04-04 VITALS — BP 135/84 | HR 118 | Temp 97.5°F | Wt 198.2 lb

## 2020-04-04 DIAGNOSIS — G43009 Migraine without aura, not intractable, without status migrainosus: Secondary | ICD-10-CM | POA: Diagnosis not present

## 2020-04-04 DIAGNOSIS — J302 Other seasonal allergic rhinitis: Secondary | ICD-10-CM | POA: Diagnosis not present

## 2020-04-04 MED ORDER — NAPROXEN 500 MG PO TABS: ORAL_TABLET | ORAL | 0 refills | Status: DC

## 2020-04-04 MED ORDER — RIZATRIPTAN BENZOATE 10 MG PO TABS: ORAL_TABLET | ORAL | 0 refills | Status: DC

## 2020-04-04 MED ORDER — LIDOCAINE-PRILOCAINE 2.5-2.5 % EX CREA: 1.0000 "application " | TOPICAL_CREAM | CUTANEOUS | 6 refills | Status: DC | PRN

## 2020-04-04 MED ORDER — OZEMPIC (0.25 OR 0.5 MG/DOSE) 2 MG/1.5ML ~~LOC~~ SOPN: PEN_INJECTOR | SUBCUTANEOUS | 1 refills | Status: DC

## 2020-04-04 MED ORDER — MONTELUKAST SODIUM 10 MG PO TABS: ORAL_TABLET | ORAL | 1 refills | Status: DC

## 2020-04-04 MED FILL — LIDOCAINE-PRILOCAINE CREAM: 2.5-2.5 | 30 days supply | Qty: 30 | Fill #5

## 2020-04-04 MED FILL — RIZATRIPTAN BENZOATE 10 MG: 10 | 30 days supply | Qty: 10 | Fill #0

## 2020-04-04 MED FILL — MONTELUKAST SOD 10 MG TAB: 10 | 90 days supply | Qty: 90 | Fill #0

## 2020-04-04 MED FILL — FREESTYLE LIBRE 14 DAY SENS: 28 days supply | Qty: 2 | Fill #4

## 2020-04-04 MED FILL — NAPROXEN 500 MG TABS: 500 | 15 days supply | Qty: 30 | Fill #0

## 2020-04-04 MED FILL — ALPRAZOLAM 1 MG TABS: 1 | 30 days supply | Qty: 60 | Fill #2

## 2020-04-04 NOTE — Progress Notes (Signed)
Subjective:    Patient ID: [name], female    DOB: [birth year], 50 y.o.   MRN: [id number]  HPI Pt here to follow up on allergy meds and blood work. Pt states Singulair is working great; refills to go to [facility].  Has an appointment with the dietician for diabetic diet counseling. BP running 130/80 outside of office. Occasionally pulse is above 120 but usually below this.  No CP/ischemic type pain or unusual SOB.  Has a remote history of migraines years ago. Began having them again the past 2 weeks. Has had 2 migraines both lasting about 2 days. No aura. Dull headache starting in the frontal area radiating to the temporal area. States it "hurts really bad". Sensitive to light and loud noises. No N/V. Vision slightly blurred. Had a normal eye exam in November. No numbness or weakness of the face, arms or legs. No difficulty speaking or swallowing. Thinks it may be related to personal stress. Has not identified any other triggers. Better in a quiet, dark room. Also massages facial area which helps.    Review of Systems     Objective:   Physical Exam NAD. Alert, oriented. TMs mildly retracted, no erythema. Pharynx clear with cloudy PND noted. Neck supple with mild soft anterior adenopathy. Lungs clear. Heart RRR. LE: trace edema.  Today's Vitals   04/04/20 1322  BP: 135/84  Pulse: (!) 118  Temp: (!) 97.5 F (36.4 C)  SpO2: 99%  Weight: 198 lb 3.2 oz (89.9 kg)   Body mass index is 32.98 kg/m.  Recent Results (from the past 2160 hour(s))  HM DIABETES EYE EXAM     Status: None   Collection Time: 01/07/20 10:05 AM  Result Value Ref Range   HM Diabetic Eye Exam No Retinopathy No Retinopathy  POCT glycosylated hemoglobin (Hb A1C)     Status: Abnormal   Collection Time: 01/11/20  1:54 PM  Result Value Ref Range   Hemoglobin A1C 6.2 (A) 4.0 - 5.6 %   HbA1c POC (<> result, manual entry)     HbA1c, POC (prediabetic range)     HbA1c, POC (controlled diabetic range)    VITAMIN D 25  Hydroxy (Vit-D Deficiency, Fractures)     Status: None   Collection Time: 03/13/20  8:28 AM  Result Value Ref Range   Vit D, 25-Hydroxy 30.9 30.0 - 100.0 ng/mL    Comment: Vitamin D deficiency has been defined by the [place] and an Endocrine Society practice guideline as a level of serum 25-OH vitamin D less than 20 ng/mL (1,2). The Endocrine Society went on to further define vitamin D insufficiency as a level between 21 and 29 ng/mL (2). 1. IOM (Institute of Medicine). 2010. Dietary reference    intakes for calcium and D. [place]: The    [employer]. 2. Holick MF, Binkley [place]art, Bischoff-Ferrari HA, et al.    Evaluation, treatment, and prevention of vitamin D    deficiency: an Endocrine Society clinical practice    guideline. JCEM. 2011 Jul; 96(7):1911-30.   Hemoglobin A1c     Status: Abnormal   Collection Time: 03/13/20  8:28 AM  Result Value Ref Range   Hgb A1c MFr Bld 6.7 (H) 4.8 - 5.6 %    Comment:          Prediabetes: 5.7 - 6.4          Diabetes: >6.4          Glycemic control for adults  with diabetes: <7.0    Est. average glucose Bld gHb Est-mCnc 146 mg/dL  Lipid panel     Status: None   Collection Time: 03/13/20  8:28 AM  Result Value Ref Range   Cholesterol, Total 123 100 - 199 mg/dL   Triglycerides 132 0 - 149 mg/dL   HDL 43 >39 mg/dL   VLDL Cholesterol Cal 23 5 - 40 mg/dL   LDL Chol Calc (NIH) 57 0 - 99 mg/dL   Chol/HDL Ratio 2.9 0.0 - 4.4 ratio    Comment:                                   T. Chol/HDL Ratio                                             Men  Women                               1/2 Avg.Risk  3.4    3.3                                   Avg.Risk  5.0    4.4                                2X Avg.Risk  9.6    7.1                                3X Avg.Risk 23.4   11.0   CMP14+EGFR     Status: Abnormal   Collection Time: 03/13/20  8:28 AM  Result Value Ref Range   Glucose 109 (H) 65 - 99 mg/dL   BUN 6 6 - 24 mg/dL    Creatinine, Ser 0.61 0.57 - 1.00 mg/dL   GFR calc non Af Amer 106 >59 mL/min/1.73   GFR calc Af Amer 122 >59 mL/min/1.73    Comment: **In accordance with recommendations from the NKF-ASN Task force,**   [facility] is in the process of updating its eGFR calculation to the   2021 CKD-EPI creatinine equation that estimates kidney function   without a race variable.    BUN/Creatinine Ratio 10 9 - 23   Sodium 141 134 - 144 mmol/L   Potassium 4.2 3.5 - 5.2 mmol/L   Chloride 99 96 - 106 mmol/L   CO2 27 20 - 29 mmol/L   Calcium 9.3 8.7 - 10.2 mg/dL   Total Protein 6.6 6.0 - 8.5 g/dL   Albumin 4.4 3.8 - 4.8 g/dL   Globulin, Total 2.2 1.5 - 4.5 g/dL   Albumin/Globulin Ratio 2.0 1.2 - 2.2   Bilirubin Total 0.3 0.0 - 1.2 mg/dL   Alkaline Phosphatase 57 44 - 121 IU/L   AST 38 0 - 40 IU/L   ALT 28 0 - 32 IU/L  B12     Status: None   Collection Time: 03/13/20  8:28 AM  Result Value Ref Range   Vitamin B-12 559 232 - 1,245 pg/mL   Reviewed recent labs with patient.  Assessment & Plan:   Problem List Items Addressed This Visit      Cardiovascular and Mediastinum   Migraine without aura and without status migrainosus, not intractable - Primary   Relevant Medications   rizatriptan (MAXALT) 10 MG tablet   naproxen (NAPROSYN) 500 MG tablet     Respiratory   Seasonal allergic rhinitis   Relevant Medications   montelukast (SINGULAIR) 10 MG tablet     Meds ordered this encounter  Medications  . montelukast (SINGULAIR) 10 MG tablet    Sig: Take one tablet by mouth daily for allergies    Dispense:  90 tablet    Refill:  1    Order Specific Question:   Supervising Provider    Answer:   Sallee Lange A [[id number]]  . Semaglutide,0.25 or 0.5MG/DOS, (OZEMPIC, 0.25 OR 0.5 MG/DOSE,) 2 MG/1.5ML SOPN    Sig: INJECT 0.5 MG UNDER THE SKIN WEEKLY    Dispense:  4.5 mL    Refill:  1    Order Specific Question:   Supervising Provider    Answer:   Sallee Lange A [[id number]]  . rizatriptan (MAXALT) 10 MG  tablet    Sig: Take one po at onset of migraine; may repeat once in 2 hours if needed; max 2 per 24 hrs    Dispense:  10 tablet    Refill:  0    Order Specific Question:   Supervising Provider    Answer:   [place], [place]  . naproxen (NAPROSYN) 500 MG tablet    Sig: Take one po BID prn migraines; take with Rizatriptan    Dispense:  30 tablet    Refill:  0    Order Specific Question:   Supervising Provider    Answer:   Sallee Lange A [[id number]]  . lidocaine-prilocaine (EMLA) cream    Sig: Apply 1 application topically as needed. Apply to feet as needed for nerve pain    Dispense:  30 g    Refill:  6    Order Specific Question:   Supervising Provider    Answer:   Sallee Lange A [[id number]]   Continue current medications. Start Maxalt and Naproxen as directed for migraines. Reviewed potential adverse effects. Recommend using a headache tracker. Warning signs reviewed. Call back if changes in symptomatology or if frequent migraines.  Return in about 3 months (around 07/02/2020).

## 2020-04-05 ENCOUNTER — Encounter: Payer: Self-pay | Admitting: Nurse Practitioner

## 2020-04-18 ENCOUNTER — Ambulatory Visit: Payer: 59 | Admitting: Nurse Practitioner

## 2020-04-20 MED FILL — PANTOPRAZOLE SOD DR 40 MG T: 40 | 90 days supply | Qty: 90 | Fill #1

## 2020-04-20 MED FILL — OMEGA-3-ACID ETHYL ESTERS 1: 1 | 30 days supply | Qty: 120 | Fill #2

## 2020-04-20 MED FILL — METFORMIN HCL 1000 MG TABS: 1000 | 90 days supply | Qty: 180 | Fill #1

## 2020-04-20 MED FILL — GABAPENTIN 600 MG TABLET: 600 | 90 days supply | Qty: 360 | Fill #1

## 2020-04-20 MED FILL — AMITRIPTYLINE HCL 50 MG TAB: 50 | 90 days supply | Qty: 90 | Fill #1

## 2020-04-30 ENCOUNTER — Encounter: Payer: 59 | Attending: Nurse Practitioner | Admitting: Nutrition

## 2020-04-30 ENCOUNTER — Encounter: Payer: Self-pay | Admitting: Nutrition

## 2020-04-30 ENCOUNTER — Other Ambulatory Visit: Payer: Self-pay

## 2020-04-30 VITALS — Ht 66.0 in | Wt 204.0 lb

## 2020-04-30 DIAGNOSIS — E782 Mixed hyperlipidemia: Secondary | ICD-10-CM | POA: Insufficient documentation

## 2020-04-30 DIAGNOSIS — E1142 Type 2 diabetes mellitus with diabetic polyneuropathy: Secondary | ICD-10-CM | POA: Insufficient documentation

## 2020-04-30 DIAGNOSIS — R748 Abnormal levels of other serum enzymes: Secondary | ICD-10-CM | POA: Insufficient documentation

## 2020-04-30 NOTE — Progress Notes (Signed)
First Visit:  Employee [phone]  Medical Nutrition Therapy  Appointment Start time:  2025  Appointment End time:  34  Primary concerns today: DM Type 2  Just diagnosed  Last year. A1C 6.7% Referral diagnosis: E11.8 Preferred learning style: n preference indicated Learning readiness: change in progress  NUTRITION ASSESSMENT   Anthropometrics  Wt Readings from Last 3 Encounters:  04/30/20 204 lb (92.5 kg)  04/04/20 198 lb 3.2 oz (89.9 kg)  03/03/20 199 lb 12.8 oz (90.6 kg)   Ht Readings from Last 3 Encounters:  04/30/20 5\' 6"  (1.676 m)  03/03/20 5\' 5"  (1.651 m)  08/06/19 5\' 5"  (1.651 m)   Body mass index is 32.93 kg/m. @BMIFA @ Facility age limit for growth percentiles is 20 years. Facility age limit for growth percentiles is 20 years.  Clinical Medical Hx:  Hyperlipidemia, Obesity, elevated liver enzymes Medications:  Metformin 1000 mg IBD, Ozempic daily. Labs:  Lab Results  Component Value Date   HGBA1C 6.7 (H) 03/13/2020   CMP Latest Ref Rng & Units 03/13/2020 10/05/2019 04/20/2019  Glucose 65 - 99 mg/dL 109(H) - -  BUN 6 - 24 mg/dL 6 - -  Creatinine 0.57 - 1.00 mg/dL 0.61 - -  Sodium 134 - 144 mmol/L 141 - -  Potassium 3.5 - 5.2 mmol/L 4.2 - -  Chloride 96 - 106 mmol/L 99 - -  CO2 20 - 29 mmol/L 27 - -  Calcium 8.7 - 10.2 mg/dL 9.3 - -  Total Protein 6.0 - 8.5 g/dL 6.6 7.7 6.9  Total Bilirubin 0.0 - 1.2 mg/dL 0.3 0.5 0.3  Alkaline Phos 44 - 121 IU/L 57 72 64  AST 0 - 40 IU/L 38 77(H) 79(H)  ALT 0 - 32 IU/L 28 49(H) 55(H)   Lipid Panel     Component Value Date/Time   CHOL 123 03/13/2020 0828   TRIG 132 03/13/2020 0828   HDL 43 03/13/2020 0828   CHOLHDL 2.9 03/13/2020 0828   CHOLHDL 5.5 01/19/2019 0843   VLDL 39 01/19/2019 0843   LDLCALC 57 03/13/2020 0828   LABVLDL 23 03/13/2020 0828    Notable Signs/Symptoms:   Lifestyle & Dietary Hx 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 Admits to eating breakfast and large supper and semi skips lunch. Wants to  lose weight and get healthier. Would like to just control her diabetes with diet and exercise. Has a libre. [employer]. TIR 91%. 9% high blood sugars. Avg BS 145 mg/dl  Estimated daily fluid intake: 24  oz Supplements: none Sleep: varies  Stress / self-care: work  Current average weekly physical activity: Some activity  24-Hr Dietary Recall Eats 2 main meals; breakfast and supper. Tends to be overly hungry at dinner due to not eating much at lunch.  Estimated Energy Needs Calories: 1400 Carbohydrate: 158g Protein: 105g Fat: 39g   NUTRITION DIAGNOSIS  NB-1.1 Food and nutrition-related knowledge deficit As related to Diabetes Type 2.  As evidenced by A1C 6.7%..   NUTRITION INTERVENTION  Nutrition education (E-1) on the following topics:  . Nutrition and Diabetes education provided on My Plate, CHO counting, meal planning, portion sizes, timing of meals, avoiding snacks between meals unless having a low blood sugar, target ranges for A1C and blood sugars, signs/symptoms and treatment of hyper/hypoglycemia, monitoring blood sugars, taking medications as prescribed, benefits of exercising 30 minutes per day and prevention of complications of DM. Marland Kitchen   Handouts Provided Include   My Plate  Meal Planning  A1C levels  Fast food guide  Meal plan card  Learning Style & Readiness for Change Teaching method utilized: Visual & Auditory  Demonstrated degree of understanding via: Teach Back  Barriers to learning/adherence to lifestyle change: none  Goals Established by Pt . Follow My Plate . Eat three meals per day . Don't skip meals . Eat 30-45 g CHO at each meal. . Increase lower carb vegetables . Drink 100 oz of water per day. Marland Kitchen    MONITORING & EVALUATION Dietary intake, weekly physical activity, and blood sugars  in 1 month.  Next Steps  Patient is to work on meal plannng.

## 2020-04-30 NOTE — Patient Instructions (Signed)
Goals Established by Pt . Follow My Plate . Eat three meals per day . Don't skip meals . Eat 30-45 g CHO at each meal. . Increase lower carb vegetables . Drink 100 oz of water per day.

## 2020-05-04 MED FILL — LIDOCAINE-PRILOCAINE CREAM: 2.5-2.5 | 30 days supply | Qty: 30 | Fill #6

## 2020-05-06 ENCOUNTER — Encounter: Payer: Self-pay | Admitting: Nurse Practitioner

## 2020-05-09 ENCOUNTER — Other Ambulatory Visit: Payer: Self-pay | Admitting: Nurse Practitioner

## 2020-05-09 DIAGNOSIS — M79676 Pain in unspecified toe(s): Secondary | ICD-10-CM

## 2020-05-13 ENCOUNTER — Other Ambulatory Visit (HOSPITAL_BASED_OUTPATIENT_CLINIC_OR_DEPARTMENT_OTHER): Payer: Self-pay

## 2020-05-13 DIAGNOSIS — M79676 Pain in unspecified toe(s): Secondary | ICD-10-CM | POA: Diagnosis not present

## 2020-05-14 ENCOUNTER — Telehealth: Payer: Self-pay

## 2020-05-14 LAB — URIC ACID: Uric Acid: 7.1 mg/dL — ABNORMAL HIGH (ref 2.6–6.2)

## 2020-05-14 MED FILL — OMEGA-3-ACID ETHYL ESTERS 1: 1 | 30 days supply | Qty: 120 | Fill #3

## 2020-05-14 NOTE — Telephone Encounter (Signed)
[name] called in response to [name]'s message.  She is at the beach so can't come in this Friday.  Will come in next Friday.  She just wanted [name] to know this.  No need to call her back.

## 2020-05-21 MED FILL — TYRVAYA 0.03 MG/ACT SOLN: 0.03 | 30 days supply | Qty: 8 | Fill #2

## 2020-05-23 ENCOUNTER — Ambulatory Visit: Payer: 59 | Admitting: Nurse Practitioner

## 2020-05-23 ENCOUNTER — Other Ambulatory Visit: Payer: Self-pay

## 2020-05-23 ENCOUNTER — Encounter: Payer: Self-pay | Admitting: Nurse Practitioner

## 2020-05-23 VITALS — BP 142/83 | HR 68 | Ht 65.0 in | Wt 200.0 lb

## 2020-05-23 DIAGNOSIS — Z79899 Other long term (current) drug therapy: Secondary | ICD-10-CM

## 2020-05-23 DIAGNOSIS — M109 Gout, unspecified: Secondary | ICD-10-CM | POA: Diagnosis not present

## 2020-05-23 MED ORDER — PREDNISONE 20 MG PO TABS: ORAL_TABLET | ORAL | 0 refills | Status: DC

## 2020-05-23 MED ORDER — ALLOPURINOL 100 MG PO TABS: 100.0000 mg | ORAL_TABLET | Freq: Every day | ORAL | 1 refills | Status: DC

## 2020-05-23 NOTE — Progress Notes (Signed)
   Subjective:    Patient ID: [name], female    DOB: April 16, 1970, 50 y.o.   MRN: [id number]  HPI  Patient arrives to discuss blood work results. Pain in left big toe. Patient concerned about gout. Symptoms first noticed around February 1.  Has had chronic neuropathy but was seeing the dietitian who felt it may be gout in her toe.  Has not been seen in our office for this.  Pain has improved but still bothering her.  No relief with ibuprofen 800 mg.  No other joint involvement.  Uric acid level recently ordered per patient request.  Last flareup of gout was 03/10/2019.  No further episodes until now. Review of Systems     Objective:   Physical Exam NAD.  Alert, oriented.  Lungs clear.  Heart regular rate rhythm.  Minimal pink discoloration and mild edema at the left podagra, moderate tenderness to palpation. Results for orders placed or performed in visit on 05/09/20  Uric acid  Result Value Ref Range   Uric Acid 7.1 (H) 2.6 - 6.2 mg/dL  Current CBG results are all 170 or less.  Today's Vitals   05/23/20 1627  BP: (!) 142/83  Pulse: 68  SpO2: 99%  Weight: 200 lb (90.7 kg)  Height: 5\' 5"  (1.651 m)   Body mass index is 33.28 kg/m.       Assessment & Plan:   Problem List Items Addressed This Visit      Musculoskeletal and Integument   Acute gout involving toe of left foot - Primary   Relevant Medications   predniSONE (DELTASONE) 20 MG tablet   allopurinol (ZYLOPRIM) 100 MG tablet   Other Relevant Orders   Uric acid   Basic metabolic panel    Other Visit Diagnoses    High risk medication use       Relevant Orders   Basic metabolic panel     Meds ordered this encounter  Medications  . predniSONE (DELTASONE) 20 MG tablet    Sig: 3 po qd x 3 d then 2 po qd x 3 d then 1 po qd x 2 d    Dispense:  17 tablet    Refill:  0    Order Specific Question:   Supervising Provider    Answer:   Sallee Lange A [[id number]]  . allopurinol (ZYLOPRIM) 100 MG tablet    Sig: Take 1  tablet (100 mg total) by mouth daily. To help prevent gout    Dispense:  30 tablet    Refill:  1    Order Specific Question:   Supervising Provider    Answer:   Sallee Lange A [id number]   Discussed options at length.  Short course of oral prednisone.  Patient understands her blood sugars will significantly increase while on this medication. Start allopurinol as directed to decrease uric acid level and help prevent further episodes of gout. Given information on a low purine diet. Repeat uric acid level and BMP in 4 to 6 weeks. Call back in 10 to 14 days if symptoms have not resolved, sooner if any problems. Otherwise routine follow-up.

## 2020-05-23 NOTE — Patient Instructions (Signed)
Repeat labs in 6 weeks (uric acid and met 7).  Low-Purine Eating Plan A low-purine eating plan involves making food choices to limit your intake of purine. Purine is a kind of uric acid. Too much uric acid in your blood can cause certain conditions, such as gout and kidney stones. Eating a low-purine diet can help control these conditions. What are tips for following this plan? Reading food labels  Avoid foods with saturated or Trans fat.  Check the ingredient list of grains-based foods, such as bread and cereal, to make sure that they contain whole grains.  Check the ingredient list of sauces or soups to make sure they do not contain meat or fish.  When choosing soft drinks, check the ingredient list to make sure they do not contain high-fructose corn syrup. Shopping  Buy plenty of fresh fruits and vegetables.  Avoid buying canned or fresh fish.  Buy dairy products labeled as low-fat or nonfat.  Avoid buying premade or processed foods. These foods are often high in fat, salt (sodium), and added sugar.   Cooking  Use olive oil instead of butter when cooking. Oils like olive oil, canola oil, and sunflower oil contain healthy fats. Meal planning  Learn which foods do or do not affect you. If you find out that a food tends to cause your gout symptoms to flare up, avoid eating that food. You can enjoy foods that do not cause problems. If you have any questions about a food item, talk with your dietitian or health care provider.  Limit foods high in fat, especially saturated fat. Fat makes it harder for your body to get rid of uric acid.  Choose foods that are lower in fat and are lean sources of protein. General guidelines  Limit alcohol intake to no more than 1 drink a day for nonpregnant women and 2 drinks a day for men. One drink equals 12 oz of beer, 5 oz of wine, or 1 oz of hard liquor. Alcohol can affect the way your body gets rid of uric acid.  Drink plenty of water to keep  your urine clear or pale yellow. Fluids can help remove uric acid from your body.  If directed by your health care provider, take a vitamin C supplement.  Work with your health care provider and dietitian to develop a plan to achieve or maintain a healthy weight. Losing weight can help reduce uric acid in your blood. What foods are recommended? The items listed may not be a complete list. Talk with your dietitian about what dietary choices are best for you. Foods low in purines Foods low in purines do not need to be limited. These include:  All fruits.  All low-purine vegetables, pickles, and olives.  Breads, pasta, rice, cornbread, and popcorn. Cake and other baked goods.  All dairy foods.  Eggs, nuts, and nut butters.  Spices and condiments, such as salt, herbs, and vinegar.  Plant oils, butter, and margarine.  Water, sugar-free soft drinks, tea, coffee, and cocoa.  Vegetable-based soups, broths, sauces, and gravies. Foods moderate in purines Foods moderate in purines should be limited to the amounts listed.   cup of asparagus, cauliflower, spinach, mushrooms, or green peas, each day.  2/3 cup uncooked oatmeal, each day.   cup dry wheat bran or wheat germ, each day.  2-3 ounces of meat or poultry, each day.  4-6 ounces of shellfish, such as crab, lobster, oysters, or shrimp, each day.  1 cup cooked beans, peas, or  lentils, each day.  Soup, broths, or bouillon made from meat or fish. Limit these foods as much as possible. What foods are not recommended? The items listed may not be a complete list. Talk with your dietitian about what dietary choices are best for you. Limit your intake of foods high in purines, including:  Beer and other alcohol.  Meat-based gravy or sauce.  Canned or fresh fish, such as: ? Anchovies, sardines, herring, and tuna. ? Mussels and scallops. ? Codfish, trout, and haddock.  Berniece Salines.  Organ meats, such as: ? Liver or  kidney. ? Tripe. ? Sweetbreads (thymus gland or pancreas).  Wild Clinical biochemist.  Yeast or yeast extract supplements.  Drinks sweetened with high-fructose corn syrup. Summary  Eating a low-purine diet can help control conditions caused by too much uric acid in the body, such as gout or kidney stones.  Choose low-purine foods, limit alcohol, and limit foods high in fat.  You will learn over time which foods do or do not affect you. If you find out that a food tends to cause your gout symptoms to flare up, avoid eating that food. This information is not intended to replace advice given to you by your health care provider. Make sure you discuss any questions you have with your health care provider. Document Revised: 05/24/2019 Document Reviewed: 05/24/2019 Elsevier Patient Education  2021 [employer].

## 2020-05-24 ENCOUNTER — Encounter: Payer: Self-pay | Admitting: Nurse Practitioner

## 2020-05-26 ENCOUNTER — Telehealth: Payer: Self-pay

## 2020-05-26 NOTE — Telephone Encounter (Signed)
Pt has never established care with me. Pls have her call in for televisit or come to office. Thx. Dr .Lovena Le

## 2020-05-26 NOTE — Telephone Encounter (Signed)
Patient stated she will send a my chart message to [facility] NP

## 2020-05-26 NOTE — Telephone Encounter (Signed)
Was seen on Friday for gout. Has been taking the Prednisone prescribed but is having a lot of pain with toe  Ibuprofen isn't helping.  Can we call something in for the pain or an anti-inflammatory? (Would like something today if possible, instead of waiting on [name])  [place]

## 2020-05-29 ENCOUNTER — Other Ambulatory Visit (HOSPITAL_COMMUNITY): Payer: Self-pay

## 2020-05-30 ENCOUNTER — Other Ambulatory Visit (HOSPITAL_COMMUNITY): Payer: Self-pay

## 2020-05-30 DIAGNOSIS — Z6833 Body mass index (BMI) 33.0-33.9, adult: Secondary | ICD-10-CM | POA: Diagnosis not present

## 2020-05-30 DIAGNOSIS — B373 Candidiasis of vulva and vagina: Secondary | ICD-10-CM | POA: Diagnosis not present

## 2020-05-30 DIAGNOSIS — Z01419 Encounter for gynecological examination (general) (routine) without abnormal findings: Secondary | ICD-10-CM | POA: Diagnosis not present

## 2020-05-30 DIAGNOSIS — Z1231 Encounter for screening mammogram for malignant neoplasm of breast: Secondary | ICD-10-CM | POA: Diagnosis not present

## 2020-05-30 MED ORDER — FLUCONAZOLE 150 MG PO TABS
ORAL_TABLET | ORAL | 1 refills | Status: DC
Start: 2020-05-30 — End: 2020-06-10
  Filled 2020-05-30: qty 1, 1d supply, fill #0
  Filled 2020-06-09: qty 1, 1d supply, fill #1

## 2020-06-01 ENCOUNTER — Ambulatory Visit
Admission: EM | Admit: 2020-06-01 | Discharge: 2020-06-01 | Disposition: A | Discharge status: Home / Self Care | Payer: 59 | Attending: Physician Assistant | Admitting: Physician Assistant

## 2020-06-01 ENCOUNTER — Encounter: Payer: Self-pay | Admitting: Emergency Medicine

## 2020-06-01 DIAGNOSIS — M109 Gout, unspecified: Secondary | ICD-10-CM | POA: Diagnosis not present

## 2020-06-01 HISTORY — DX: Gout, unspecified: M10.9

## 2020-06-01 MED ORDER — INDOMETHACIN 50 MG PO CAPS: 50.0000 mg | ORAL_CAPSULE | Freq: Three times a day (TID) | ORAL | 0 refills | Status: DC

## 2020-06-01 NOTE — Discharge Instructions (Addendum)
Take medication as prescribed Follow up with primary care if no improvement.  May apply ice to affected area, elevate, rest.

## 2020-06-01 NOTE — ED Provider Notes (Addendum)
[facility]    CSN: [id number] Arrival date & time: 06/01/20  0806      History   Chief Complaint No chief complaint on file.   HPI [name] is a 50 y.o. female.   Pt experiencing gout flare up left toe that started several weeks ago.  Seen by PCP, started on prednisone and prescribed allopurinol, she has not started allopurinol based off recommendation from pharmacist.  She completed prednisone yesterday with improvement, rebound of pain today.  She is diabetic and reports elevated blood sugars while on prednisone, highest up to 400. She reports pain is worse with walking.      Past Medical History:  Diagnosis Date  . Allergy   . Anxiety   . Gout   . History of migraine headaches   . IBS (irritable bowel syndrome)   . Impaired fasting glucose     Patient Active Problem List   Diagnosis Date Noted  . Acute gout involving toe of left foot 05/23/2020  . Migraine without aura and without status migrainosus, not intractable 04/04/2020  . Acute otitis externa of right ear 03/03/2020  . Seasonal allergic rhinitis 03/03/2020  . Nasal congestion 03/03/2020  . Uncontrolled type 2 diabetes mellitus with hyperglycemia ([place]) 08/06/2019  . Type 2 diabetes mellitus with diabetic polyneuropathy, without long-term current use of insulin ([place]) 02/03/2019  . Mixed hyperlipidemia 02/03/2019  . B12 deficiency 12/18/2018  . NAFL (nonalcoholic fatty liver) 64/40/3474  . Elevated liver enzymes 09/22/2018  . Chronic insomnia 08/19/2018  . 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 Peripheral polyneuropathy 09/20/2017  . Hypertriglyceridemia 02/18/2017  . Tachycardia 02/18/2017  . Acne rosacea 11/20/2016  . Peripheral sensory neuropathy 09/30/2016  . Hypertension not at goal 03/26/2016  . Vitamin D deficiency 10/29/2014  . Elevated blood pressure 07/15/2014  . Class 1 obesity due to excess calories with serious comorbidity and body mass index (BMI) of 33.0 to 33.9 in adult 04/05/2014   . Esophageal reflux 10/13/2013  . Acute pyelonephritis 10/13/2013  . Irritable bowel syndrome with constipation 06/25/2013  . Anxiety 02/14/2013  . Knee pain, chronic 02/14/2013    Past Surgical History:  Procedure Laterality Date  . ABDOMINAL HYSTERECTOMY  2005   partial  . BLADDER SUSPENSION  2012  . BREAST REDUCTION SURGERY  2002  . CHOLECYSTECTOMY  2005  . EYE SURGERY  1999   laser   . UPPER GASTROINTESTINAL ENDOSCOPY     20 years ago by Dr. Gala Romney. - told she had ulcers    OB History   No obstetric history on file.      Home Medications    Prior to Admission medications   Medication Sig Start Date End Date Taking? Authorizing Provider  indomethacin (INDOCIN) 50 MG capsule Take 1 capsule (50 mg total) by mouth 3 (three) times daily with meals. 06/01/20  Yes Bryndle Corredor, PA-C  allopurinol (ZYLOPRIM) 100 MG tablet Take 1 tablet (100 mg total) by mouth daily. To help prevent gout 05/23/20   Nilda Simmer, NP  amitriptyline (ELAVIL) 50 MG tablet TAKE 1 TABLET BY MOUTH AT BEDTIME 01/11/20 01/10/21  Nilda Simmer, NP  Continuous Blood Gluc Sensor (FREESTYLE LIBRE 14 DAY SENSOR) MISC Apply as directed every 14 days. 01/16/20   Nilda Simmer, NP  Continuous Blood Gluc Sensor (FREESTYLE LIBRE 14 DAY SENSOR) MISC APPLY AS DIRECTED EVERY 14 DAYS. 01/04/20 01/03/21  Nilda Simmer, NP  cyanocobalamin (,VITAMIN B-12,) 1000 MCG/ML injection Inject 1 mL (1,000 mcg total)  into the muscle every 30 (thirty) days. 06/01/19   Nilda Simmer, NP  cyclobenzaprine (FLEXERIL) 10 MG tablet TAKE 1 TABLET BY MOUTH AT BEDTIME AS NEEDED FOR MUSCLE SPASMS 01/11/20 01/10/21  Nilda Simmer, NP  fluconazole (DIFLUCAN) 150 MG tablet TAKE 1 TABLET BY MOUTH ONCE AS A SINGLE DOSE. TAKE 1 TABLET 5 DAYS LATER 12/14/19 12/13/20  Margarita Mail, PA-C  fluconazole (DIFLUCAN) 150 MG tablet Take 1 tablet by mouth as needed for 1 day. 05/30/20     gabapentin (NEURONTIN) 600 MG tablet TAKE 1  TABLET BY MOUTH THREE TIMES DAILY WITH AN ADDITIONAL TABLET AT BEDTIME 06/22/19 06/21/20  Nilda Simmer, NP  lidocaine-prilocaine (EMLA) cream APPLY TO FEET AS NEEDED FOR NERVE PAIN 04/04/20 04/04/21  Nilda Simmer, NP  metFORMIN (GLUCOPHAGE) 1000 MG tablet TAKE 1 TABLET BY MOUTH 2 TIMES DAILY WITH MEALS 01/21/20 01/20/21  Lovena Le, Malena M, DO  montelukast (SINGULAIR) 10 MG tablet TAKE 1 TABLET BY MOUTH ONCE A DAY FOR ALLERGIES 04/04/20 04/04/21  Nilda Simmer, NP  omega-3 acid ethyl esters (LOVAZA) 1 g capsule TAKE 2 CAPSULES BY MOUTH TWO TIMES DAILY 02/06/20 02/05/21  Lovena Le, Malena M, DO  pantoprazole (PROTONIX) 40 MG tablet Take 1 tablet (40 mg total) by mouth daily. 01/16/20   Lovena Le, Malena M, DO  predniSONE (DELTASONE) 20 MG tablet 3 po qd x 3 d then 2 po qd x 3 d then 1 po qd x 2 d 05/23/20   Nilda Simmer, NP  rizatriptan (MAXALT) 10 MG tablet TAKE ONE BY MOUTH AT ONSET OF MIGRAINE MAY REPEAT ONCE IN 2 HOURS IF NEEDED MAX 2 PER 24 HRS Patient not taking: Reported on 04/30/2020 04/04/20 04/04/21  Nilda Simmer, NP  rosuvastatin (CRESTOR) 10 MG tablet TAKE 1 TABLET (10 MG TOTAL) BY MOUTH AT BEDTIME. 03/04/20 03/04/21  Nilda Simmer, NP  Semaglutide,0.25 or 0.5MG /DOS, 2 MG/1.5ML SOPN INJECT 0.5 MG UNDER THE SKIN WEEKLY 04/04/20 04/04/21  Nilda Simmer, NP  Varenicline Tartrate 0.03 MG/ACT SOLN PLACE 1 SQUIRT INTRANASALLY 2 TIMES DAILY 01/22/20 01/21/21  Madelin Headings, DO  cetirizine (ZYRTEC) 10 MG tablet Take 1 tablet (10 mg total) by mouth daily. 09/24/19 03/03/20  Sharion Balloon, FNP    Family History Family History  Problem Relation Age of Onset  . Diabetes Mother   . Neuropathy Mother   . Heart failure Mother   . Hypertension Mother   . Cancer Mother   . Atrial fibrillation Father   . Hypertension Father   . Colonic polyp Father     Social History Social History   Tobacco Use  . Smoking status: Never Smoker  . Smokeless tobacco: Never Used  Vaping Use  .  Vaping Use: Never used  Substance Use Topics  . Alcohol use: Yes    Comment: occasional   . Drug use: Never     Allergies   Omnicef [cefdinir] and Losartan   Review of Systems Review of Systems  Constitutional: Negative for chills and fever.  HENT: Negative for ear pain and sore throat.   Eyes: Negative for pain and visual disturbance.  Respiratory: Negative for cough and shortness of breath.   Cardiovascular: Negative for chest pain and palpitations.  Gastrointestinal: Negative for abdominal pain and vomiting.  Genitourinary: Negative for dysuria and hematuria.  Musculoskeletal: Positive for arthralgias (left great toe). Negative for back pain.  Skin: Negative for color change and rash.  Neurological: Negative for seizures and syncope.  All other  systems reviewed and are negative.    Physical Exam Triage Vital Signs ED Triage Vitals  Enc Vitals Group     BP 06/01/20 0817 (!) 153/105     Pulse Rate 06/01/20 0817 (!) 108     Resp 06/01/20 0817 18     Temp 06/01/20 0817 98.2 F (36.8 C)     Temp Source 06/01/20 0817 Oral     SpO2 06/01/20 0817 98 %     Weight --      Height --      Head Circumference --      Peak Flow --      Pain Score 06/01/20 0816 8     Pain Loc --      Pain Edu? --      Excl. in [place]? --    No data found.  Updated Vital Signs BP (!) 153/105 (BP Location: Right Arm)   Pulse (!) 108   Temp 98.2 F (36.8 C) (Oral)   Resp 18   SpO2 98%   Visual Acuity Right Eye Distance:   Left Eye Distance:   Bilateral Distance:    Right Eye Near:   Left Eye Near:    Bilateral Near:     Physical Exam Vitals and nursing note reviewed.  Constitutional:      General: She is not in acute distress.    Appearance: She is well-developed.  HENT:     Head: Normocephalic and atraumatic.  Eyes:     Conjunctiva/sclera: Conjunctivae normal.  Cardiovascular:     Rate and Rhythm: Normal rate and regular rhythm.     Heart sounds: No murmur  heard.   Pulmonary:     Effort: Pulmonary effort is normal. No respiratory distress.     Breath sounds: Normal breath sounds.  Abdominal:     Palpations: Abdomen is soft.     Tenderness: There is no abdominal tenderness.  Musculoskeletal:     Cervical back: Neck supple.       Feet:  Skin:    General: Skin is warm and dry.  Neurological:     Mental Status: She is alert.      UC Treatments / Results  Labs (all labs ordered are listed, but only abnormal results are displayed) Labs Reviewed - No data to display  EKG   Radiology No results found.  Procedures Procedures (including critical care time)  Medications Ordered in UC Medications - No data to display  Initial Impression / Assessment and Plan / UC Course  I have reviewed the triage vital signs and the nursing notes.  Pertinent labs & imaging results that were available during my care of the patient were reviewed by me and considered in my medical decision making (see chart for details).     Persistent gout flare up with rebound pain after completing prednisone.  Will not reinitiate prednisone due to elevated blood sugars.  Will start indomethicin.  Advised to follow up with PCP if no improvement.  Final Clinical Impressions(s) / UC Diagnoses   Final diagnoses:  Acute gout involving toe of left foot, unspecified cause     Discharge Instructions     Take medication as prescribed Follow up with primary care if no improvement.  May apply ice to affected area, elevate, rest.    ED Prescriptions    Medication Sig Dispense Auth. Provider   indomethacin (INDOCIN) 50 MG capsule Take 1 capsule (50 mg total) by mouth 3 (three) times daily with meals. 60 capsule  Konrad Felix, PA-C     PDMP not reviewed this encounter.   Konrad Felix, PA-C 06/01/20 0921    Konrad Felix, PA-C 06/01/20 [id number]

## 2020-06-01 NOTE — ED Triage Notes (Signed)
Recently dx with gout. Pain to LT great toe that started this morning.  Just finished steroid rx yesterday.  Also reports temp was 100 yesterday. Took 800mg  ibuprofen at 0500 today.

## 2020-06-02 ENCOUNTER — Ambulatory Visit: Payer: 59 | Admitting: Nutrition

## 2020-06-02 ENCOUNTER — Telehealth: Payer: Self-pay | Admitting: Emergency Medicine

## 2020-06-02 ENCOUNTER — Other Ambulatory Visit (HOSPITAL_COMMUNITY): Payer: Self-pay

## 2020-06-02 MED ORDER — INDOMETHACIN 50 MG PO CAPS: 50.0000 mg | ORAL_CAPSULE | Freq: Three times a day (TID) | ORAL | 0 refills | Status: AC

## 2020-06-02 MED ORDER — INDOMETHACIN 50 MG PO CAPS
50.0000 mg | ORAL_CAPSULE | Freq: Three times a day (TID) | ORAL | 0 refills | Status: DC
  Filled 2020-06-02: qty 30, 10d supply, fill #0

## 2020-06-03 ENCOUNTER — Telehealth: Payer: Self-pay | Admitting: Family Medicine

## 2020-06-03 NOTE — Telephone Encounter (Signed)
Patient advised per Dr Lovena Le: Labs from gyn showing a1c elevated at 7.4.  Need to cont to dec carbs in diet and increase in exercising.   Cont meds  Patient verbalized understanding and was wondering if her diabetic injectable needs to be increased or stay on current dosing

## 2020-06-03 NOTE — Telephone Encounter (Signed)
Left message to return call 

## 2020-06-04 ENCOUNTER — Other Ambulatory Visit: Payer: Self-pay | Admitting: Nurse Practitioner

## 2020-06-04 ENCOUNTER — Encounter: Payer: Self-pay | Admitting: Nurse Practitioner

## 2020-06-04 MED ORDER — OZEMPIC (1 MG/DOSE) 4 MG/3ML ~~LOC~~ SOPN
1.0000 mg | PEN_INJECTOR | SUBCUTANEOUS | 2 refills | Status: DC
  Filled 2020-06-04: qty 9, 84d supply, fill #0

## 2020-06-04 NOTE — Telephone Encounter (Signed)
Will address as my chart message

## 2020-06-05 ENCOUNTER — Other Ambulatory Visit (HOSPITAL_COMMUNITY): Payer: Self-pay

## 2020-06-08 MED FILL — Rosuvastatin Calcium Tab 10 MG: ORAL | 90 days supply | Qty: 90 | Fill #0 | Status: AC

## 2020-06-09 ENCOUNTER — Other Ambulatory Visit (HOSPITAL_COMMUNITY): Payer: Self-pay

## 2020-06-10 ENCOUNTER — Other Ambulatory Visit (HOSPITAL_COMMUNITY): Payer: Self-pay

## 2020-06-10 MED ORDER — FLUCONAZOLE 150 MG PO TABS
ORAL_TABLET | ORAL | 0 refills | Status: DC
  Filled 2020-06-10: qty 2, 2d supply, fill #0

## 2020-06-19 ENCOUNTER — Ambulatory Visit: Payer: 59 | Admitting: Nutrition

## 2020-06-19 ENCOUNTER — Ambulatory Visit: Payer: 59 | Admitting: Neurology

## 2020-06-20 ENCOUNTER — Other Ambulatory Visit: Payer: Self-pay

## 2020-06-20 ENCOUNTER — Encounter: Payer: Self-pay | Admitting: Nurse Practitioner

## 2020-06-20 ENCOUNTER — Ambulatory Visit: Payer: 59 | Admitting: Nurse Practitioner

## 2020-06-20 ENCOUNTER — Other Ambulatory Visit (HOSPITAL_COMMUNITY): Payer: Self-pay

## 2020-06-20 VITALS — BP 145/90 | HR 114 | Temp 97.1°F | Ht 65.0 in | Wt 204.0 lb

## 2020-06-20 DIAGNOSIS — R309 Painful micturition, unspecified: Secondary | ICD-10-CM | POA: Diagnosis not present

## 2020-06-20 DIAGNOSIS — B3731 Acute candidiasis of vulva and vagina: Secondary | ICD-10-CM

## 2020-06-20 DIAGNOSIS — R300 Dysuria: Secondary | ICD-10-CM

## 2020-06-20 DIAGNOSIS — E1142 Type 2 diabetes mellitus with diabetic polyneuropathy: Secondary | ICD-10-CM | POA: Diagnosis not present

## 2020-06-20 DIAGNOSIS — B373 Candidiasis of vulva and vagina: Secondary | ICD-10-CM | POA: Diagnosis not present

## 2020-06-20 LAB — POCT URINALYSIS DIP (MANUAL ENTRY)
Spec Grav, UA: 1.01 (ref 1.010–1.025)
pH, UA: 5 (ref 5.0–8.0)

## 2020-06-20 MED ORDER — TERCONAZOLE 0.4 % VA CREA: 1.0000 | TOPICAL_CREAM | Freq: Every day | VAGINAL | 0 refills | Status: DC

## 2020-06-20 MED ORDER — CYCLOBENZAPRINE HCL 10 MG PO TABS
ORAL_TABLET | ORAL | 0 refills | Status: DC
  Filled 2020-06-20: qty 90, 90d supply, fill #0

## 2020-06-20 MED ORDER — ALLOPURINOL 100 MG PO TABS
100.0000 mg | ORAL_TABLET | Freq: Every day | ORAL | 1 refills | Status: DC
  Filled 2020-06-20: qty 90, 90d supply, fill #0

## 2020-06-20 NOTE — Progress Notes (Signed)
   Subjective:    Patient ID: [name], female    DOB: [birth year], 50 y.o.   MRN: [id number]  HPI Possible yeast infection and urinary concerns. Burning externally with urination. No fever. No change in urgency or frequency. Mild left low back pain, no flank pain.  No recent UTI. Began about a week ago.  CBG reading 159 and fingerstick 200. Had a physical and pelvic with GYN on 4/8. Diagnosed with vaginal yeast infection. No relief after 2 courses of Diflucan. Labs were done there, no copy available during visit. States her A1C was 7.4.       Objective:   Physical Exam NAD. Alert, oriented. Lungs clear. Heart RRR. No CVA tenderness. Abdomen obese, soft, non tender.  POCT urinalysis dipstick  Result Value Ref Range   Color, UA     Clarity, UA     Glucose, UA     Bilirubin, UA     Ketones, POC UA     Spec Grav, UA 1.010 1.010 - 1.025   Blood, UA     pH, UA 5.0 5.0 - 8.0   Protein Ur, POC     Urobilinogen, UA     Nitrite, UA     Leukocytes, UA    POCT UA - Microscopic Only  Result Value Ref Range   WBC, Ur, HPF, POC neg 0 - 5   RBC, Urine, Miroscopic neg 0 - 2   Bacteria, U Microscopic neg None - Trace   Mucus, UA     Epithelial cells, urine per micros rare    Crystals, Ur, HPF, POC     Casts, Ur, LPF, POC     Yeast, UA     Today's Vitals   06/20/20 1151  BP: (!) 145/90  Pulse: (!) 114  Temp: (!) 97.1 F (36.2 C)  SpO2: 99%  Weight: 204 lb (92.5 kg)  Height: 5\' 5"  (1.651 m)   Body mass index is 33.95 kg/m.        Assessment & Plan:  Dysuria - Plan: POCT urinalysis dipstick, Urine Culture, POCT UA - Microscopic Only  Type 2 diabetes mellitus with diabetic polyneuropathy, without long-term current use of insulin (HCC) - Plan: Microalbumin / creatinine urine ratio  Vaginal candidiasis  Meds ordered this encounter  Medications  . allopurinol (ZYLOPRIM) 100 MG tablet    Sig: Take 1 tablet (100 mg total) by mouth daily. To help prevent gout    Dispense:   90 tablet    Refill:  1    Order Specific Question:   Supervising Provider    Answer:   Sallee Lange A [[id number]]  . cyclobenzaprine (FLEXERIL) 10 MG tablet    Sig: TAKE 1 TABLET BY MOUTH AT BEDTIME AS NEEDED FOR MUSCLE SPASMS    Dispense:  90 tablet    Refill:  0    Order Specific Question:   Supervising Provider    Answer:   Sallee Lange A [[id number]]  . terconazole (TERAZOL 7) 0.4 % vaginal cream    Sig: Place 1 applicator vaginally at bedtime. X 7 nights    Dispense:  45 g    Refill:  0    Order Specific Question:   Supervising Provider    Answer:   Kathyrn Drown [phone]   Start Terazol as directed. Urine culture and ACR pending.  Warning signs reviewed. Call back next week if no improvement, sooner if worse.

## 2020-06-21 ENCOUNTER — Encounter: Payer: Self-pay | Admitting: Nurse Practitioner

## 2020-06-21 LAB — POCT UA - MICROSCOPIC ONLY
Bacteria, U Microscopic: NEGATIVE
RBC, Urine, Miroscopic: NEGATIVE (ref 0–2)
WBC, Ur, HPF, POC: NEGATIVE (ref 0–5)

## 2020-06-23 ENCOUNTER — Other Ambulatory Visit (HOSPITAL_COMMUNITY): Payer: Self-pay

## 2020-06-23 ENCOUNTER — Encounter: Payer: Self-pay | Admitting: Nurse Practitioner

## 2020-06-23 MED FILL — Omega-3-acid Ethyl Esters Cap 1 GM: ORAL | 90 days supply | Qty: 360 | Fill #0 | Status: AC

## 2020-06-24 MED FILL — Varenicline Tartrate Nasal Soln 0.03 MG/ACT: NASAL | 30 days supply | Qty: 8.4 | Fill #0 | Status: AC

## 2020-06-25 ENCOUNTER — Other Ambulatory Visit (HOSPITAL_COMMUNITY): Payer: Self-pay

## 2020-06-25 ENCOUNTER — Telehealth: Payer: Self-pay

## 2020-06-25 ENCOUNTER — Other Ambulatory Visit: Payer: Self-pay | Admitting: Nurse Practitioner

## 2020-06-25 MED ORDER — FREESTYLE LIBRE 14 DAY SENSOR MISC
0 refills | Status: DC
  Filled 2020-06-25: qty 2, 28d supply, fill #0

## 2020-06-25 NOTE — Telephone Encounter (Signed)
Can we route the refill request for the freestyle libre AND the B12 injections (that she had sent a message in [place]) to Dr. Lovena Le since [place] not here this week?  Both to [facility] long pharm

## 2020-06-25 NOTE — Telephone Encounter (Signed)
Has not established care with me.  Needs appt for more refills. Thx. Dr. Lovena Le

## 2020-06-26 ENCOUNTER — Other Ambulatory Visit (HOSPITAL_COMMUNITY): Payer: Self-pay

## 2020-06-26 ENCOUNTER — Encounter: Payer: Self-pay | Admitting: Nurse Practitioner

## 2020-06-26 ENCOUNTER — Other Ambulatory Visit: Payer: Self-pay | Admitting: Nurse Practitioner

## 2020-06-26 MED ORDER — CYANOCOBALAMIN 1000 MCG/ML IJ SOLN
1000.0000 ug | INTRAMUSCULAR | 1 refills | Status: DC
  Filled 2020-06-26: qty 3, 90d supply, fill #0
  Filled 2020-08-07 – 2020-09-05 (×2): qty 3, 90d supply, fill #1

## 2020-06-26 NOTE — Telephone Encounter (Signed)
B12 refilled. Thanks.

## 2020-06-27 ENCOUNTER — Other Ambulatory Visit (HOSPITAL_COMMUNITY): Payer: Self-pay

## 2020-06-27 LAB — URINE CULTURE

## 2020-06-27 LAB — MICROALBUMIN / CREATININE URINE RATIO
Creatinine, Urine: 44.9 mg/dL
Microalb/Creat Ratio: 10 mg/g creat (ref 0–29)
Microalbumin, Urine: 4.3 ug/mL

## 2020-06-29 ENCOUNTER — Other Ambulatory Visit: Payer: Self-pay | Admitting: Nurse Practitioner

## 2020-06-29 MED ORDER — CIPROFLOXACIN HCL 500 MG PO TABS: 500.0000 mg | ORAL_TABLET | Freq: Two times a day (BID) | ORAL | 0 refills | Status: DC

## 2020-06-29 MED FILL — Montelukast Sodium Tab 10 MG (Base Equiv): ORAL | 90 days supply | Qty: 90 | Fill #0 | Status: AC

## 2020-06-30 ENCOUNTER — Other Ambulatory Visit (HOSPITAL_COMMUNITY): Payer: Self-pay

## 2020-07-01 ENCOUNTER — Other Ambulatory Visit (HOSPITAL_COMMUNITY): Payer: Self-pay

## 2020-07-02 ENCOUNTER — Other Ambulatory Visit (HOSPITAL_COMMUNITY): Payer: Self-pay

## 2020-07-02 DIAGNOSIS — Z79899 Other long term (current) drug therapy: Secondary | ICD-10-CM | POA: Diagnosis not present

## 2020-07-02 DIAGNOSIS — M109 Gout, unspecified: Secondary | ICD-10-CM | POA: Diagnosis not present

## 2020-07-03 LAB — BASIC METABOLIC PANEL
BUN/Creatinine Ratio: 11 (ref 9–23)
BUN: 8 mg/dL (ref 6–24)
CO2: 26 mmol/L (ref 20–29)
Calcium: 9.9 mg/dL (ref 8.7–10.2)
Chloride: 98 mmol/L (ref 96–106)
Creatinine, Ser: 0.72 mg/dL (ref 0.57–1.00)
Glucose: 172 mg/dL — ABNORMAL HIGH (ref 65–99)
Potassium: 4.2 mmol/L (ref 3.5–5.2)
Sodium: 142 mmol/L (ref 134–144)
eGFR: 102 mL/min/{1.73_m2} (ref >59)

## 2020-07-03 LAB — URIC ACID: Uric Acid: 5.9 mg/dL (ref 2.6–6.2)

## 2020-07-04 ENCOUNTER — Other Ambulatory Visit: Payer: Self-pay | Admitting: Nurse Practitioner

## 2020-07-04 ENCOUNTER — Ambulatory Visit: Payer: 59 | Admitting: Nurse Practitioner

## 2020-07-04 ENCOUNTER — Encounter: Payer: Self-pay | Admitting: Nurse Practitioner

## 2020-07-04 DIAGNOSIS — M109 Gout, unspecified: Secondary | ICD-10-CM

## 2020-07-10 ENCOUNTER — Encounter: Payer: Self-pay | Admitting: *Deleted

## 2020-07-11 ENCOUNTER — Other Ambulatory Visit (HOSPITAL_COMMUNITY): Payer: Self-pay

## 2020-07-20 ENCOUNTER — Other Ambulatory Visit: Payer: Self-pay | Admitting: Family Medicine

## 2020-07-20 ENCOUNTER — Other Ambulatory Visit: Payer: Self-pay | Admitting: Nurse Practitioner

## 2020-07-20 DIAGNOSIS — Z79899 Other long term (current) drug therapy: Secondary | ICD-10-CM

## 2020-07-20 DIAGNOSIS — I10 Essential (primary) hypertension: Secondary | ICD-10-CM

## 2020-07-20 DIAGNOSIS — E1142 Type 2 diabetes mellitus with diabetic polyneuropathy: Secondary | ICD-10-CM

## 2020-07-20 DIAGNOSIS — E782 Mixed hyperlipidemia: Secondary | ICD-10-CM

## 2020-07-20 MED FILL — Varenicline Tartrate Nasal Soln 0.03 MG/ACT: NASAL | 30 days supply | Qty: 8.4 | Fill #1 | Status: AC

## 2020-07-22 ENCOUNTER — Other Ambulatory Visit: Payer: Self-pay | Admitting: Nurse Practitioner

## 2020-07-22 ENCOUNTER — Other Ambulatory Visit (HOSPITAL_COMMUNITY): Payer: Self-pay

## 2020-07-22 ENCOUNTER — Encounter: Payer: Self-pay | Admitting: Nurse Practitioner

## 2020-07-22 ENCOUNTER — Other Ambulatory Visit: Payer: Self-pay | Admitting: Family Medicine

## 2020-07-22 MED ORDER — FREESTYLE LIBRE 14 DAY SENSOR MISC
3 refills | Status: DC
  Filled 2020-07-22: qty 2, 28d supply, fill #0
  Filled 2020-12-01: qty 2, 28d supply, fill #1

## 2020-07-22 MED ORDER — PANTOPRAZOLE SODIUM 40 MG PO TBEC
DELAYED_RELEASE_TABLET | Freq: Every day | ORAL | 0 refills | Status: DC
  Filled 2020-07-22: qty 30, 30d supply, fill #0

## 2020-07-22 MED ORDER — GABAPENTIN 600 MG PO TABS
ORAL_TABLET | ORAL | 1 refills | Status: DC
  Filled 2020-07-22: qty 120, 30d supply, fill #0
  Filled 2020-08-22: qty 120, 30d supply, fill #1

## 2020-07-22 MED ORDER — OMEGA-3-ACID ETHYL ESTERS 1 G PO CAPS
2.0000 | ORAL_CAPSULE | Freq: Two times a day (BID) | ORAL | 2 refills | Status: DC
  Filled 2020-07-22 – 2020-09-19 (×2): qty 120, 30d supply, fill #0

## 2020-07-22 MED ORDER — AMITRIPTYLINE HCL 50 MG PO TABS
ORAL_TABLET | Freq: Every day | ORAL | 0 refills | Status: DC
  Filled 2020-07-22: qty 60, 60d supply, fill #0

## 2020-07-22 MED ORDER — NAPROXEN 500 MG PO TABS
ORAL_TABLET | ORAL | 0 refills | Status: DC
  Filled 2020-07-22: qty 30, 15d supply, fill #0

## 2020-07-22 MED ORDER — RIZATRIPTAN BENZOATE 10 MG PO TABS
ORAL_TABLET | ORAL | 0 refills | Status: DC
  Filled 2020-07-22: qty 10, 28d supply, fill #0

## 2020-07-22 MED ORDER — METFORMIN HCL 1000 MG PO TABS
ORAL_TABLET | ORAL | 0 refills | Status: DC
  Filled 2020-07-22: qty 180, 90d supply, fill #0

## 2020-07-22 NOTE — Addendum Note (Signed)
Addended by: Vicente Males on: 07/22/2020 01:33 PM   Modules accepted: Orders

## 2020-07-22 NOTE — Telephone Encounter (Signed)
Pt needs f/u appt in 7/22 for refill son diabetic meds and labs.   pls order cbc, cmp, lipids, and a1c   Thx.   Dr. Lovena Le

## 2020-07-22 NOTE — Telephone Encounter (Signed)
Lab Results  Component Value Date   HGBA1C 6.7 (H) 03/13/2020    Lab Results  Component Value Date   CREATININE 0.72 07/02/2020     Lab Results  Component Value Date   CHOL 123 03/13/2020   HDL 43 03/13/2020   LDLCALC 57 03/13/2020   TRIG 132 03/13/2020   CHOLHDL 2.9 03/13/2020     BP Readings from Last 3 Encounters:  06/20/20 (!) 145/90  06/01/20 (!) 153/105  05/23/20 (!) 142/83

## 2020-07-23 ENCOUNTER — Other Ambulatory Visit (HOSPITAL_COMMUNITY): Payer: Self-pay

## 2020-07-24 ENCOUNTER — Encounter: Payer: Self-pay | Admitting: Nurse Practitioner

## 2020-07-24 ENCOUNTER — Telehealth: Payer: Self-pay

## 2020-07-24 NOTE — Telephone Encounter (Signed)
Patient would like [name] to review the lab orders in the system to make sure she needs to have all of these done.  She said she just had some labwork done.

## 2020-07-24 NOTE — Telephone Encounter (Signed)
Patient calls asking to have current lab orders be reviewed , she just had some labwork done and has an upcoming appt in July. Lab orders in system cbc, cmp, lipid, a1c, uric acid. Recently had uric acid and bmp. Please advise

## 2020-07-25 ENCOUNTER — Other Ambulatory Visit: Payer: Self-pay | Admitting: Nurse Practitioner

## 2020-07-25 ENCOUNTER — Other Ambulatory Visit (HOSPITAL_COMMUNITY): Payer: Self-pay

## 2020-07-25 MED ORDER — PANTOPRAZOLE SODIUM 40 MG PO TBEC
DELAYED_RELEASE_TABLET | Freq: Every day | ORAL | 0 refills | Status: DC
  Filled 2020-07-25: qty 90, fill #0
  Filled 2020-08-22: qty 90, 90d supply, fill #0

## 2020-07-25 MED ORDER — RIZATRIPTAN BENZOATE 10 MG PO TABS
ORAL_TABLET | ORAL | 0 refills | Status: DC
  Filled 2020-07-25: qty 30, 15d supply, fill #0
  Filled 2021-01-07: qty 18, 30d supply, fill #0
  Filled 2021-04-09: qty 12, 30d supply, fill #1

## 2020-07-25 MED ORDER — AMITRIPTYLINE HCL 50 MG PO TABS
ORAL_TABLET | Freq: Every day | ORAL | 0 refills | Status: DC
  Filled 2020-07-25: qty 90, fill #0
  Filled 2020-09-15: qty 90, 90d supply, fill #0

## 2020-07-25 MED ORDER — ROSUVASTATIN CALCIUM 10 MG PO TABS
ORAL_TABLET | Freq: Every day | ORAL | 0 refills | Status: DC
  Filled 2020-07-25: qty 90, fill #0
  Filled 2020-09-05: qty 90, 90d supply, fill #0

## 2020-07-25 NOTE — Telephone Encounter (Signed)
I sent a review of the last labs in my chart. Dr. Lovena Le ordered her most recent labs but I do not see the results. Thanks.

## 2020-07-25 NOTE — Telephone Encounter (Signed)
Patient had labs done in April and in May 2022, are there any additional labs that she needs drawn prior to 09/05/20 follow up appt . Please advise .

## 2020-07-26 ENCOUNTER — Other Ambulatory Visit: Payer: Self-pay | Admitting: Nurse Practitioner

## 2020-07-26 ENCOUNTER — Other Ambulatory Visit (HOSPITAL_COMMUNITY): Payer: Self-pay

## 2020-07-26 MED ORDER — ALLOPURINOL 300 MG PO TABS
300.0000 mg | ORAL_TABLET | Freq: Every day | ORAL | 1 refills | Status: DC
  Filled 2020-07-26: qty 90, 90d supply, fill #0
  Filled 2020-10-27: qty 90, 90d supply, fill #1

## 2020-07-28 NOTE — Telephone Encounter (Signed)
Labs are already ordered in the chart for pt to get 1 wk prior to appt.  Dr. Lovena Le

## 2020-08-07 ENCOUNTER — Other Ambulatory Visit (HOSPITAL_COMMUNITY): Payer: Self-pay

## 2020-08-07 MED FILL — Varenicline Tartrate Nasal Soln 0.03 MG/ACT: NASAL | 30 days supply | Qty: 8.4 | Fill #2 | Status: AC

## 2020-08-08 ENCOUNTER — Other Ambulatory Visit (HOSPITAL_COMMUNITY): Payer: Self-pay

## 2020-08-13 ENCOUNTER — Other Ambulatory Visit (HOSPITAL_COMMUNITY): Payer: Self-pay

## 2020-08-14 ENCOUNTER — Encounter: Payer: 59 | Attending: Family Medicine | Admitting: Nutrition

## 2020-08-14 ENCOUNTER — Encounter: Payer: Self-pay | Admitting: Nutrition

## 2020-08-14 ENCOUNTER — Other Ambulatory Visit: Payer: Self-pay

## 2020-08-14 VITALS — Ht 65.0 in | Wt 196.6 lb

## 2020-08-14 DIAGNOSIS — E1142 Type 2 diabetes mellitus with diabetic polyneuropathy: Secondary | ICD-10-CM | POA: Insufficient documentation

## 2020-08-14 DIAGNOSIS — E538 Deficiency of other specified B group vitamins: Secondary | ICD-10-CM | POA: Insufficient documentation

## 2020-08-14 DIAGNOSIS — E781 Pure hyperglyceridemia: Secondary | ICD-10-CM | POA: Insufficient documentation

## 2020-08-14 DIAGNOSIS — R748 Abnormal levels of other serum enzymes: Secondary | ICD-10-CM | POA: Insufficient documentation

## 2020-08-14 DIAGNOSIS — E782 Mixed hyperlipidemia: Secondary | ICD-10-CM | POA: Insufficient documentation

## 2020-08-14 DIAGNOSIS — M10072 Idiopathic gout, left ankle and foot: Secondary | ICD-10-CM | POA: Insufficient documentation

## 2020-08-14 DIAGNOSIS — Z6833 Body mass index (BMI) 33.0-33.9, adult: Secondary | ICD-10-CM | POA: Insufficient documentation

## 2020-08-14 DIAGNOSIS — E1165 Type 2 diabetes mellitus with hyperglycemia: Secondary | ICD-10-CM | POA: Insufficient documentation

## 2020-08-14 DIAGNOSIS — M109 Gout, unspecified: Secondary | ICD-10-CM | POA: Insufficient documentation

## 2020-08-14 DIAGNOSIS — E6609 Other obesity due to excess calories: Secondary | ICD-10-CM | POA: Insufficient documentation

## 2020-08-14 NOTE — Patient Instructions (Signed)
Goals  Follow Low Purine Diet Keep food journal Increase steps to 7,000 per day Increase lower carb veggies and fruits Follow Plant based guides

## 2020-08-14 NOTE — Progress Notes (Signed)
First Visit:  Employee [phone]  Medical Nutrition Therapy  Appointment Start time:  1600  Appointment End time:  60  Primary concerns today: DM Type 2  Just diagnosed  Last year. A1C 6.7% Referral diagnosis: E11.8 Preferred learning style: n preference indicated Learning readiness: change in progress  NUTRITION ASSESSMENT  Had GOUT and was started on Allpurinol. Feeling much better. Eating better balanced foods. Trying to prep meals and take them to work Still work on progress.  Lost 8 lbs. Working on more exercise and cutting down on portions.   Anthropometrics  Wt Readings from Last 3 Encounters:  08/14/20 196 lb 9.6 oz (89.2 kg)  06/20/20 204 lb (92.5 kg)  05/23/20 200 lb (90.7 kg)   Ht Readings from Last 3 Encounters:  08/14/20 5\' 5"  (1.651 m)  06/20/20 5\' 5"  (1.651 m)  05/23/20 5\' 5"  (1.651 m)   Body mass index is 32.72 kg/m. @BMIFA @ Facility age limit for growth percentiles is 20 years. Facility age limit for growth percentiles is 20 years.  Clinical Medical Hx:  Hyperlipidemia, Obesity, elevated liver enzymes Medications:  Metformin 1000 mg IBD, Ozempic daily. Labs:  Lab Results  Component Value Date   HGBA1C 6.7 (H) 03/13/2020   CMP Latest Ref Rng & Units 07/02/2020 03/13/2020 10/05/2019  Glucose 65 - 99 mg/dL 172(H) 109(H) -  BUN 6 - 24 mg/dL 8 6 -  Creatinine 0.57 - 1.00 mg/dL 0.72 0.61 -  Sodium 134 - 144 mmol/L 142 141 -  Potassium 3.5 - 5.2 mmol/L 4.2 4.2 -  Chloride 96 - 106 mmol/L 98 99 -  CO2 20 - 29 mmol/L 26 27 -  Calcium 8.7 - 10.2 mg/dL 9.9 9.3 -  Total Protein 6.0 - 8.5 g/dL - 6.6 7.7  Total Bilirubin 0.0 - 1.2 mg/dL - 0.3 0.5  Alkaline Phos 44 - 121 IU/L - 57 72  AST 0 - 40 IU/L - 38 77(H)  ALT 0 - 32 IU/L - 28 49(H)   Lipid Panel     Component Value Date/Time   CHOL 123 03/13/2020 0828   TRIG 132 03/13/2020 0828   HDL 43 03/13/2020 0828   CHOLHDL 2.9 03/13/2020 0828   CHOLHDL 5.5 01/19/2019 0843   VLDL 39 01/19/2019 0843   LDLCALC  57 03/13/2020 0828   LABVLDL 23 03/13/2020 0828    Notable Signs/Symptoms:   Lifestyle & Dietary Hx 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 Admits to eating breakfast and large supper and semi skips lunch. Wants to lose weight and get healthier. Would like to just control her diabetes with diet and exercise. Has a libre. [employer]. TIR 91%. 9% high blood sugars. Avg BS 145 mg/dl  Estimated daily fluid intake: 24  oz Supplements: none Sleep: varies  Stress / self-care: work  Current average weekly physical activity: Some activity  24-Hr Dietary Recall Eats 2 main meals; breakfast and supper. Tends to be overly hungry at dinner due to not eating much at lunch.  Estimated Energy Needs Calories: 1400 Carbohydrate: 158g Protein: 105g Fat: 39g   NUTRITION DIAGNOSIS  NB-1.1 Food and nutrition-related knowledge deficit As related to Diabetes Type 2.  As evidenced by A1C 6.7%..   NUTRITION INTERVENTION  Nutrition education (E-1) on the following topics:  Nutrition and Diabetes education provided on My Plate, CHO counting, meal planning, portion sizes, timing of meals, avoiding snacks between meals unless having a low blood sugar, target ranges for A1C and blood sugars, signs/symptoms and treatment of hyper/hypoglycemia, monitoring blood  sugars, taking medications as prescribed, benefits of exercising 30 minutes per day and prevention of complications of DM.   Handouts Provided Include  My Plate Meal Planning V2Z levels Fast food guide Meal plan card  Learning Style & Readiness for Change Teaching method utilized: Visual & Auditory  Demonstrated degree of understanding via: Teach Back  Barriers to learning/adherence to lifestyle change: none  Goals Established by Pt Goals  Follow Low Purine Diet Keep food journal Increase steps to 7,000 per day Increase lower carb veggies and fruits Follow Plant based guides   MONITORING & EVALUATION Dietary intake, weekly  physical activity, and blood sugars  in 1 month.  Next Steps  Patient is to work on meal plannng.

## 2020-08-18 ENCOUNTER — Other Ambulatory Visit (HOSPITAL_COMMUNITY): Payer: Self-pay

## 2020-08-19 ENCOUNTER — Other Ambulatory Visit (HOSPITAL_COMMUNITY): Payer: Self-pay

## 2020-08-20 ENCOUNTER — Encounter: Payer: Self-pay | Admitting: Nutrition

## 2020-08-21 ENCOUNTER — Other Ambulatory Visit (HOSPITAL_COMMUNITY): Payer: Self-pay

## 2020-08-22 ENCOUNTER — Other Ambulatory Visit (HOSPITAL_COMMUNITY): Payer: Self-pay

## 2020-08-22 ENCOUNTER — Other Ambulatory Visit: Payer: Self-pay | Admitting: Nurse Practitioner

## 2020-08-22 MED ORDER — CYCLOBENZAPRINE HCL 10 MG PO TABS
ORAL_TABLET | ORAL | 0 refills | Status: DC
  Filled 2020-08-22: qty 90, fill #0
  Filled 2020-08-26 – 2020-09-05 (×2): qty 90, 90d supply, fill #0

## 2020-08-25 ENCOUNTER — Other Ambulatory Visit: Payer: Self-pay | Admitting: Nurse Practitioner

## 2020-08-26 ENCOUNTER — Other Ambulatory Visit (HOSPITAL_COMMUNITY): Payer: Self-pay

## 2020-08-27 ENCOUNTER — Other Ambulatory Visit (HOSPITAL_COMMUNITY): Payer: Self-pay

## 2020-08-27 MED ORDER — PANTOPRAZOLE SODIUM 40 MG PO TBEC
DELAYED_RELEASE_TABLET | Freq: Every day | ORAL | 0 refills | Status: DC
  Filled 2020-08-27: qty 90, 90d supply, fill #0

## 2020-08-29 ENCOUNTER — Ambulatory Visit: Payer: 59 | Admitting: Nurse Practitioner

## 2020-09-01 ENCOUNTER — Other Ambulatory Visit (HOSPITAL_COMMUNITY): Payer: Self-pay

## 2020-09-02 ENCOUNTER — Encounter: Payer: Self-pay | Admitting: Nurse Practitioner

## 2020-09-02 DIAGNOSIS — M109 Gout, unspecified: Secondary | ICD-10-CM | POA: Diagnosis not present

## 2020-09-02 DIAGNOSIS — E1142 Type 2 diabetes mellitus with diabetic polyneuropathy: Secondary | ICD-10-CM | POA: Diagnosis not present

## 2020-09-02 DIAGNOSIS — I10 Essential (primary) hypertension: Secondary | ICD-10-CM | POA: Diagnosis not present

## 2020-09-02 DIAGNOSIS — E782 Mixed hyperlipidemia: Secondary | ICD-10-CM | POA: Diagnosis not present

## 2020-09-02 DIAGNOSIS — Z79899 Other long term (current) drug therapy: Secondary | ICD-10-CM | POA: Diagnosis not present

## 2020-09-03 LAB — COMPREHENSIVE METABOLIC PANEL
ALT: 28 IU/L (ref 0–32)
AST: 50 IU/L — ABNORMAL HIGH (ref 0–40)
Albumin/Globulin Ratio: 2.1 (ref 1.2–2.2)
Albumin: 4.9 g/dL — ABNORMAL HIGH (ref 3.8–4.8)
Alkaline Phosphatase: 65 IU/L (ref 44–121)
BUN/Creatinine Ratio: 10 (ref 9–23)
BUN: 6 mg/dL (ref 6–24)
Bilirubin Total: 0.3 mg/dL (ref 0.0–1.2)
CO2: 21 mmol/L (ref 20–29)
Calcium: 9.9 mg/dL (ref 8.7–10.2)
Chloride: 97 mmol/L (ref 96–106)
Creatinine, Ser: 0.59 mg/dL (ref 0.57–1.00)
Globulin, Total: 2.3 g/dL (ref 1.5–4.5)
Glucose: 115 mg/dL — ABNORMAL HIGH (ref 65–99)
Potassium: 4.7 mmol/L (ref 3.5–5.2)
Sodium: 139 mmol/L (ref 134–144)
Total Protein: 7.2 g/dL (ref 6.0–8.5)
eGFR: 110 mL/min/{1.73_m2} (ref >59)

## 2020-09-03 LAB — CBC WITH DIFFERENTIAL/PLATELET
Basophils Absolute: 0.1 10*3/uL (ref 0.0–0.2)
Basos: 1 %
EOS (ABSOLUTE): 1 10*3/uL — ABNORMAL HIGH (ref 0.0–0.4)
Eos: 12 %
Hematocrit: 38.9 % (ref 34.0–46.6)
Hemoglobin: 12.5 g/dL (ref 11.1–15.9)
Immature Grans (Abs): 0 10*3/uL (ref 0.0–0.1)
Immature Granulocytes: 0 %
Lymphocytes Absolute: 2.7 10*3/uL (ref 0.7–3.1)
Lymphs: 32 %
MCH: 27.7 pg (ref 26.6–33.0)
MCHC: 32.1 g/dL (ref 31.5–35.7)
MCV: 86 fL (ref 79–97)
Monocytes Absolute: 0.5 10*3/uL (ref 0.1–0.9)
Monocytes: 6 %
Neutrophils Absolute: 4.1 10*3/uL (ref 1.4–7.0)
Neutrophils: 49 %
Platelets: 395 10*3/uL (ref 150–450)
RBC: 4.51 x10E6/uL (ref 3.77–5.28)
RDW: 14.2 % (ref 11.7–15.4)
WBC: 8.4 10*3/uL (ref 3.4–10.8)

## 2020-09-03 LAB — HEMOGLOBIN A1C
Est. average glucose Bld gHb Est-mCnc: 163 mg/dL
Hgb A1c MFr Bld: 7.3 % — ABNORMAL HIGH (ref 4.8–5.6)

## 2020-09-03 LAB — LIPID PANEL
Chol/HDL Ratio: 3.2 ratio (ref 0.0–4.4)
Cholesterol, Total: 120 mg/dL (ref 100–199)
HDL: 37 mg/dL — ABNORMAL LOW (ref >39)
LDL Chol Calc (NIH): 54 mg/dL (ref 0–99)
Triglycerides: 174 mg/dL — ABNORMAL HIGH (ref 0–149)
VLDL Cholesterol Cal: 29 mg/dL (ref 5–40)

## 2020-09-03 LAB — URIC ACID: Uric Acid: 4.1 mg/dL (ref 2.6–6.2)

## 2020-09-05 ENCOUNTER — Other Ambulatory Visit: Payer: Self-pay

## 2020-09-05 ENCOUNTER — Other Ambulatory Visit: Payer: Self-pay | Admitting: Nurse Practitioner

## 2020-09-05 ENCOUNTER — Other Ambulatory Visit (HOSPITAL_COMMUNITY): Payer: Self-pay

## 2020-09-05 ENCOUNTER — Other Ambulatory Visit: Payer: Self-pay | Admitting: Family Medicine

## 2020-09-05 ENCOUNTER — Ambulatory Visit: Payer: 59 | Admitting: Nurse Practitioner

## 2020-09-05 VITALS — BP 121/81 | Ht 65.0 in | Wt 196.2 lb

## 2020-09-05 DIAGNOSIS — F419 Anxiety disorder, unspecified: Secondary | ICD-10-CM

## 2020-09-05 DIAGNOSIS — Z23 Encounter for immunization: Secondary | ICD-10-CM

## 2020-09-05 DIAGNOSIS — E1142 Type 2 diabetes mellitus with diabetic polyneuropathy: Secondary | ICD-10-CM

## 2020-09-05 DIAGNOSIS — M1A0720 Idiopathic chronic gout, left ankle and foot, without tophus (tophi): Secondary | ICD-10-CM | POA: Diagnosis not present

## 2020-09-05 DIAGNOSIS — J302 Other seasonal allergic rhinitis: Secondary | ICD-10-CM | POA: Diagnosis not present

## 2020-09-05 DIAGNOSIS — G608 Other hereditary and idiopathic neuropathies: Secondary | ICD-10-CM | POA: Diagnosis not present

## 2020-09-05 DIAGNOSIS — I10 Essential (primary) hypertension: Secondary | ICD-10-CM | POA: Diagnosis not present

## 2020-09-05 DIAGNOSIS — F5104 Psychophysiologic insomnia: Secondary | ICD-10-CM

## 2020-09-05 MED FILL — Lidocaine-Prilocaine Cream 2.5-2.5%: CUTANEOUS | 30 days supply | Qty: 30 | Fill #0 | Status: AC

## 2020-09-05 NOTE — Progress Notes (Signed)
Subjective:    Patient ID: [name], female    DOB: August 27, 1970, 50 y.o.   MRN: [id number]  Diabetes She presents for her follow-up diabetic visit. She has type 2 diabetes mellitus. Risk factors for coronary artery disease include hypertension and dyslipidemia. Current diabetic treatments: glucophage, ozempic. She is compliant with treatment all of the time.   Has been doing much better with diet and weight loss. Denies adverse effects of Ozempic. Has noticed weight loss around her abdominal area.  Remains under stress due to dealing with elderly parents. Also struggling some since her son and his children recently left her home after 4 years.  Defers need for daily medication but requesting Xanax to use occasionally to help with sleep. Denies chest pain/ischemic type pain or shortness of breath.  No edema. No further gout episodes on allopurinol, gout in her left toe is slowly improving. Depression screen PHQ 2/9 09/05/2020  Decreased Interest 0  Down, Depressed, Hopeless 0  PHQ - 2 Score 0  Altered sleeping -  Tired, decreased energy -  Change in appetite -  Feeling bad or failure about yourself  -  Trouble concentrating -  Moving slowly or fidgety/restless -  Suicidal thoughts -  PHQ-9 Score -  Difficult doing work/chores -  Some encounter information is confidential and restricted. Go to Review Flowsheets activity to see all data.  Some recent data might be hidden       Objective:   Physical Exam NAD.  Alert, oriented.  Lungs clear.  Heart regular rate rhythm.  Abdomen has significant adiposity but softer and much improved from previous visit.  Our scales show that she has lost 8 pounds since her visit in April. Today's Vitals   09/05/20 1402  BP: 121/81  Weight: 196 lb 3.2 oz (89 kg)  Height: 5\' 5"  (1.651 m)   Body mass index is 32.65 kg/m. Results for orders placed or performed in visit on 07/20/20  CBC with Differential  Result Value Ref Range   WBC 8.4 3.4 -  10.8 x10E3/uL   RBC 4.51 3.77 - 5.28 x10E6/uL   Hemoglobin 12.5 11.1 - 15.9 g/dL   Hematocrit 07/22/20 77.4 - 46.6 %   MCV 86 79 - 97 fL   MCH 27.7 26.6 - 33.0 pg   MCHC 32.1 31.5 - 35.7 g/dL   RDW 58.8 27.8 - 36.0 %   Platelets 395 150 - 450 x10E3/uL   Neutrophils 49 Not Estab. %   Lymphs 32 Not Estab. %   Monocytes 6 Not Estab. %   Eos 12 Not Estab. %   Basos 1 Not Estab. %   Neutrophils Absolute 4.1 1.4 - 7.0 x10E3/uL   Lymphocytes Absolute 2.7 0.7 - 3.1 x10E3/uL   Monocytes Absolute 0.5 0.1 - 0.9 x10E3/uL   EOS (ABSOLUTE) 1.0 (H) 0.0 - 0.4 x10E3/uL   Basophils Absolute 0.1 0.0 - 0.2 x10E3/uL   Immature Granulocytes 0 Not Estab. %   Immature Grans (Abs) 0.0 0.0 - 0.1 x10E3/uL  Comprehensive Metabolic Panel (CMET)  Result Value Ref Range   Glucose 115 (H) 65 - 99 mg/dL   BUN 6 6 - 24 mg/dL   Creatinine, Ser 79.7 0.57 - 1.00 mg/dL   eGFR 5.14 153 >78   BUN/Creatinine Ratio 10 9 - 23   Sodium 139 134 - 144 mmol/L   Potassium 4.7 3.5 - 5.2 mmol/L   Chloride 97 96 - 106 mmol/L   CO2 21 20 - 29 mmol/L  Calcium 9.9 8.7 - 10.2 mg/dL   Total Protein 7.2 6.0 - 8.5 g/dL   Albumin 4.9 (H) 3.8 - 4.8 g/dL   Globulin, Total 2.3 1.5 - 4.5 g/dL   Albumin/Globulin Ratio 2.1 1.2 - 2.2   Bilirubin Total 0.3 0.0 - 1.2 mg/dL   Alkaline Phosphatase 65 44 - 121 IU/L   AST 50 (H) 0 - 40 IU/L   ALT 28 0 - 32 IU/L  Lipid Profile  Result Value Ref Range   Cholesterol, Total 120 100 - 199 mg/dL   Triglycerides 174 (H) 0 - 149 mg/dL   HDL 37 (L) >39 mg/dL   VLDL Cholesterol Cal 29 5 - 40 mg/dL   LDL Chol Calc (NIH) 54 0 - 99 mg/dL   Chol/HDL Ratio 3.2 0.0 - 4.4 ratio  Hemoglobin A1c  Result Value Ref Range   Hgb A1c MFr Bld 7.3 (H) 4.8 - 5.6 %   Est. average glucose Bld gHb Est-mCnc 163 mg/dL  Reviewed labs with patient.  Discussed slight changes in her labs from previous results.  It is encouraging that patient has been losing weight and trying to eat healthy.      Assessment &  Plan:   Problem List Items Addressed This Visit       Cardiovascular and Mediastinum   Hypertension not at goal   Relevant Medications   rosuvastatin (CRESTOR) 10 MG tablet     Respiratory   Seasonal allergic rhinitis   Relevant Medications   montelukast (SINGULAIR) 10 MG tablet     Endocrine   Type 2 diabetes mellitus with diabetic polyneuropathy, without long-term current use of insulin (HCC) - Primary   Relevant Medications   gabapentin (NEURONTIN) 600 MG tablet   cyclobenzaprine (FLEXERIL) 10 MG tablet   rosuvastatin (CRESTOR) 10 MG tablet   metFORMIN (GLUCOPHAGE) 1000 MG tablet   Semaglutide, 2 MG/DOSE, 8 MG/3ML SOPN   ALPRAZolam (XANAX) 0.5 MG tablet     Nervous and Auditory   Peripheral sensory neuropathy   Relevant Medications   gabapentin (NEURONTIN) 600 MG tablet   cyclobenzaprine (FLEXERIL) 10 MG tablet   ALPRAZolam (XANAX) 0.5 MG tablet     Musculoskeletal and Integument   Chronic idiopathic gout involving toe of left foot without tophus   Relevant Medications   cyclobenzaprine (FLEXERIL) 10 MG tablet     Other   Anxiety   Relevant Medications   ALPRAZolam (XANAX) 0.5 MG tablet   Chronic insomnia   Other Visit Diagnoses     Need for vaccination       Relevant Orders   Pneumococcal polysaccharide vaccine 23-valent greater than or equal to 2yo subcutaneous/IM (Completed)      Meds ordered this encounter  Medications   gabapentin (NEURONTIN) 600 MG tablet    Sig: Take 1 tablet by mouth every 6 hours as needed for pain.    Dispense:  360 tablet    Refill:  1    Order Specific Question:   Supervising Provider    Answer:   Sallee Lange A [[id number]]   cyclobenzaprine (FLEXERIL) 10 MG tablet    Sig: TAKE 1 TABLET BY MOUTH AT BEDTIME AS NEEDED FOR MUSCLE SPASMS    Dispense:  90 tablet    Refill:  1    Order Specific Question:   Supervising Provider    Answer:   Sallee Lange A [[id number]]   rosuvastatin (CRESTOR) 10 MG tablet    Sig: TAKE 1 TABLET (10 MG  TOTAL) BY MOUTH  AT BEDTIME.    Dispense:  90 tablet    Refill:  1    Order Specific Question:   Supervising Provider    Answer:   Sallee Lange A [[id number]]   metFORMIN (GLUCOPHAGE) 1000 MG tablet    Sig: TAKE 1 TABLET BY MOUTH 2 TIMES DAILY WITH A MEAL    Dispense:  180 tablet    Refill:  1    Order Specific Question:   Supervising Provider    Answer:   Sallee Lange A [[id number]]   cyanocobalamin (,VITAMIN B-12,) 1000 MCG/ML injection    Sig: Inject 1 mL (1,000 mcg total) into the muscle every 30 (thirty) days.    Dispense:  3 mL    Refill:  0    Please provide a 3 mL syringe & a 23 gauge 1 inch needle with every dosage each month. Dispense 90 d supply.    Order Specific Question:   Supervising Provider    Answer:   Sallee Lange A [[id number]]   montelukast (SINGULAIR) 10 MG tablet    Sig: TAKE 1 TABLET BY MOUTH ONCE A DAY FOR ALLERGIES    Dispense:  90 tablet    Refill:  1    Order Specific Question:   Supervising Provider    Answer:   Sallee Lange A [[id number]]   Semaglutide, 2 MG/DOSE, 8 MG/3ML SOPN    Sig: Inject 2 mg as directed once a week.    Dispense:  9 mL    Refill:  1    Order Specific Question:   Supervising Provider    Answer:   Sallee Lange A [[id number]]   blood glucose meter kit and supplies KIT    Sig: Use twice daily as directed for diabetes.    Dispense:  1 each    Refill:  0    Diagnosis: E11.42    Order Specific Question:   Supervising Provider    Answer:   Sallee Lange A [[id number]]    Order Specific Question:   Number of strips    Answer:   100    Order Specific Question:   Number of lancets    Answer:   100   ALPRAZolam (XANAX) 0.5 MG tablet    Sig: Take 1/2-1 po qhs prn sleep    Dispense:  20 tablet    Refill:  0    Order Specific Question:   Supervising Provider    Answer:   Sallee Lange A [[id number]]     Increase Ozempic to 2 mg/week.  Call if any adverse effects.  Prescription sent in for blood glucose meter and supplies for patient to check her blood sugar at home.   Prefers this over a continuous glucose monitor. Given short-term prescription of low-dose Xanax to use sparingly for sleep.  Defers other medication for anxiety at this time. Continue weight loss efforts.  Encourage patient to increase activity and to take time for herself as far as self-care.  Pneumovax 23 today. Return in about 3 months (around 12/06/2020).  30 minutes was spent with the patient including previsit chart review, time spent with patient, discussion of health issues, review of data including medical record, and documentation of the visit.

## 2020-09-06 ENCOUNTER — Encounter: Payer: Self-pay | Admitting: Nurse Practitioner

## 2020-09-06 ENCOUNTER — Other Ambulatory Visit (HOSPITAL_COMMUNITY): Payer: Self-pay

## 2020-09-06 DIAGNOSIS — M1A0720 Idiopathic chronic gout, left ankle and foot, without tophus (tophi): Secondary | ICD-10-CM | POA: Insufficient documentation

## 2020-09-06 MED ORDER — BLOOD GLUCOSE MONITOR KIT
PACK | 0 refills | Status: DC
  Filled 2020-09-06: qty 1, 30d supply, fill #0

## 2020-09-06 MED ORDER — FREESTYLE LITE TEST VI STRP
ORAL_STRIP | 0 refills | Status: DC
  Filled 2020-09-06: qty 100, 50d supply, fill #0

## 2020-09-06 MED ORDER — ALPRAZOLAM 0.5 MG PO TABS
ORAL_TABLET | ORAL | 0 refills | Status: DC
  Filled 2020-09-06: qty 20, 20d supply, fill #0

## 2020-09-06 MED ORDER — CYCLOBENZAPRINE HCL 10 MG PO TABS
ORAL_TABLET | ORAL | 1 refills | Status: DC
  Filled 2020-09-06: qty 90, fill #0
  Filled 2020-12-17: qty 90, 90d supply, fill #0

## 2020-09-06 MED ORDER — GABAPENTIN 600 MG PO TABS
600.0000 mg | ORAL_TABLET | Freq: Four times a day (QID) | ORAL | 1 refills | Status: DC | PRN
  Filled 2020-09-06: qty 360, fill #0
  Filled 2020-09-19: qty 360, 90d supply, fill #0
  Filled 2020-12-17: qty 360, 90d supply, fill #1

## 2020-09-06 MED ORDER — ROSUVASTATIN CALCIUM 10 MG PO TABS
10.0000 mg | ORAL_TABLET | Freq: Every day | ORAL | 1 refills | Status: DC
  Filled 2020-09-06: qty 90, fill #0
  Filled 2020-11-30: qty 90, 90d supply, fill #0

## 2020-09-06 MED ORDER — MONTELUKAST SODIUM 10 MG PO TABS
ORAL_TABLET | ORAL | 1 refills | Status: DC
  Filled 2020-09-06: qty 90, 90d supply, fill #0
  Filled 2020-11-30: qty 90, 90d supply, fill #1

## 2020-09-06 MED ORDER — FREESTYLE LANCETS MISC
0 refills | Status: DC
  Filled 2020-09-06: qty 100, 50d supply, fill #0

## 2020-09-06 MED ORDER — METFORMIN HCL 1000 MG PO TABS
1000.0000 mg | ORAL_TABLET | Freq: Two times a day (BID) | ORAL | 1 refills | Status: DC
  Filled 2020-09-06: qty 180, fill #0
  Filled 2020-10-12: qty 180, 90d supply, fill #0
  Filled 2021-01-07: qty 180, 90d supply, fill #1

## 2020-09-06 MED ORDER — CYANOCOBALAMIN 1000 MCG/ML IJ SOLN
1000.0000 ug | INTRAMUSCULAR | 0 refills | Status: DC
  Filled 2020-09-06: qty 3, 90d supply, fill #0

## 2020-09-06 MED ORDER — SEMAGLUTIDE (2 MG/DOSE) 8 MG/3ML ~~LOC~~ SOPN
2.0000 mg | PEN_INJECTOR | SUBCUTANEOUS | 1 refills | Status: DC
  Filled 2020-09-06: qty 9, 84d supply, fill #0
  Filled 2020-12-01: qty 3, 28d supply, fill #1

## 2020-09-08 ENCOUNTER — Encounter: Payer: Self-pay | Admitting: Nurse Practitioner

## 2020-09-08 ENCOUNTER — Other Ambulatory Visit (HOSPITAL_COMMUNITY): Payer: Self-pay

## 2020-09-10 ENCOUNTER — Other Ambulatory Visit: Payer: Self-pay | Admitting: Nurse Practitioner

## 2020-09-10 ENCOUNTER — Other Ambulatory Visit (HOSPITAL_COMMUNITY): Payer: Self-pay

## 2020-09-10 MED ORDER — IBUPROFEN 800 MG PO TABS
800.0000 mg | ORAL_TABLET | Freq: Three times a day (TID) | ORAL | 0 refills | Status: DC | PRN
  Filled 2020-09-10: qty 120, 40d supply, fill #0

## 2020-09-12 ENCOUNTER — Other Ambulatory Visit (HOSPITAL_COMMUNITY): Payer: Self-pay

## 2020-09-15 ENCOUNTER — Other Ambulatory Visit (HOSPITAL_COMMUNITY): Payer: Self-pay

## 2020-09-17 ENCOUNTER — Ambulatory Visit: Payer: 59 | Admitting: Neurology

## 2020-09-19 ENCOUNTER — Other Ambulatory Visit (HOSPITAL_COMMUNITY): Payer: Self-pay

## 2020-09-26 ENCOUNTER — Other Ambulatory Visit: Payer: Self-pay | Admitting: Nurse Practitioner

## 2020-09-26 ENCOUNTER — Encounter: Payer: Self-pay | Admitting: Nurse Practitioner

## 2020-09-26 MED ORDER — OMEGA-3-ACID ETHYL ESTERS 1 G PO CAPS
2.0000 | ORAL_CAPSULE | Freq: Two times a day (BID) | ORAL | 1 refills | Status: DC
  Filled 2020-09-26: qty 360, 90d supply, fill #0
  Filled 2020-12-17: qty 360, 90d supply, fill #1

## 2020-09-27 ENCOUNTER — Other Ambulatory Visit (HOSPITAL_COMMUNITY): Payer: Self-pay

## 2020-10-13 ENCOUNTER — Encounter: Payer: Self-pay | Admitting: Nurse Practitioner

## 2020-10-13 ENCOUNTER — Other Ambulatory Visit (HOSPITAL_COMMUNITY): Payer: Self-pay

## 2020-10-23 ENCOUNTER — Other Ambulatory Visit: Payer: Self-pay | Admitting: Nurse Practitioner

## 2020-10-23 ENCOUNTER — Other Ambulatory Visit (HOSPITAL_COMMUNITY): Payer: Self-pay

## 2020-10-24 ENCOUNTER — Other Ambulatory Visit (HOSPITAL_COMMUNITY): Payer: Self-pay

## 2020-10-24 MED ORDER — CYANOCOBALAMIN 1000 MCG/ML IJ SOLN
1000.0000 ug | INTRAMUSCULAR | 0 refills | Status: DC
  Filled 2020-10-24 – 2020-12-17 (×2): qty 3, 90d supply, fill #0

## 2020-10-27 ENCOUNTER — Other Ambulatory Visit (HOSPITAL_COMMUNITY): Payer: Self-pay

## 2020-10-28 ENCOUNTER — Other Ambulatory Visit (HOSPITAL_COMMUNITY): Payer: Self-pay

## 2020-10-29 ENCOUNTER — Other Ambulatory Visit (HOSPITAL_COMMUNITY): Payer: Self-pay

## 2020-10-29 ENCOUNTER — Other Ambulatory Visit: Payer: Self-pay | Admitting: Nurse Practitioner

## 2020-10-29 MED FILL — Lidocaine-Prilocaine Cream 2.5-2.5%: CUTANEOUS | 30 days supply | Qty: 30 | Fill #1 | Status: AC

## 2020-10-30 ENCOUNTER — Encounter: Payer: Self-pay | Admitting: Nurse Practitioner

## 2020-10-31 ENCOUNTER — Other Ambulatory Visit: Payer: Self-pay

## 2020-10-31 ENCOUNTER — Ambulatory Visit (INDEPENDENT_AMBULATORY_CARE_PROVIDER_SITE_OTHER): Payer: 59 | Admitting: Nurse Practitioner

## 2020-10-31 ENCOUNTER — Other Ambulatory Visit (HOSPITAL_COMMUNITY): Payer: Self-pay

## 2020-10-31 ENCOUNTER — Other Ambulatory Visit: Payer: Self-pay | Admitting: Nurse Practitioner

## 2020-10-31 DIAGNOSIS — K219 Gastro-esophageal reflux disease without esophagitis: Secondary | ICD-10-CM

## 2020-10-31 DIAGNOSIS — F419 Anxiety disorder, unspecified: Secondary | ICD-10-CM | POA: Diagnosis not present

## 2020-10-31 MED ORDER — PANTOPRAZOLE SODIUM 40 MG PO TBEC
DELAYED_RELEASE_TABLET | Freq: Every day | ORAL | 0 refills | Status: DC
  Filled 2020-10-31 – 2020-12-17 (×2): qty 90, 90d supply, fill #0

## 2020-10-31 MED ORDER — AMITRIPTYLINE HCL 50 MG PO TABS
ORAL_TABLET | Freq: Every day | ORAL | 0 refills | Status: DC
  Filled 2020-10-31: qty 90, fill #0
  Filled 2020-12-17: qty 90, 90d supply, fill #0

## 2020-10-31 MED ORDER — IBUPROFEN 800 MG PO TABS
800.0000 mg | ORAL_TABLET | Freq: Three times a day (TID) | ORAL | 0 refills | Status: DC | PRN
  Filled 2020-10-31: qty 120, 40d supply, fill #0

## 2020-10-31 MED ORDER — ALPRAZOLAM 0.5 MG PO TABS
ORAL_TABLET | ORAL | 0 refills | Status: DC
  Filled 2020-10-31: qty 30, 30d supply, fill #0

## 2020-10-31 MED ORDER — ALLOPURINOL 300 MG PO TABS
300.0000 mg | ORAL_TABLET | Freq: Every day | ORAL | 0 refills | Status: DC
Start: 2020-10-31 — End: 2021-01-30
  Filled 2020-10-31 – 2021-01-26 (×2): qty 90, 90d supply, fill #0

## 2020-10-31 NOTE — Progress Notes (Signed)
Subjective:    Patient ID: [name], female    DOB: [birth year], 50 y.o.   MRN: [id number]  HPI Patient calls for a follow up and medication refills.   Virtual Visit via Telephone Note  I connected with [name] on 10/31/20 at  1:40 PM EDT by telephone and verified that I am speaking with the correct person using two identifiers.  Location: Patient: hospital with her husband Provider: office   I discussed the limitations, risks, security and privacy concerns of performing an evaluation and management service by telephone and the availability of in person appointments. I also discussed with the patient that there may be a patient responsible charge related to this service. The patient expressed understanding and agreed to proceed.   History of Present Illness: Phone visit with patient today, notes she is at the hospital with her husband who has had a mild stroke.  Has been under significant increase in anxiety and stress.  Main reason for her call today is to get refills on her medications with the current transition in the office.  Adherent to medication regimen.  All the medications seem to be working well at this point but requesting a temporary increase in the number of Xanax per month due to situational stress.  Also requesting a refill on her ibuprofen that she uses for pain/headaches.  States her reflux has been stable on daily pantoprazole.     Observations/Objective: Today's visit was via telephone Physical exam was not possible for this visit Alert, oriented.  Anxious affect.  Assessment and Plan: Problem List Items Addressed This Visit       Digestive   Esophageal reflux   Relevant Medications   pantoprazole (PROTONIX) 40 MG tablet     Other   Anxiety - Primary   Relevant Medications   ALPRAZolam (XANAX) 0.5 MG tablet   amitriptyline (ELAVIL) 50 MG tablet   Meds ordered this encounter  Medications   ALPRAZolam (XANAX) 0.5 MG tablet    Sig: Take 1/2-1  tablet by mouth at bedtime as needed for sleep    Dispense:  30 tablet    Refill:  0    Order Specific Question:   Supervising Provider    Answer:   Sallee Lange A [[id number]]   ibuprofen (ADVIL) 800 MG tablet    Sig: Take 1 tablet (800 mg total) by mouth every 8 (eight) hours as needed.    Dispense:  120 tablet    Refill:  0    Order Specific Question:   Supervising Provider    Answer:   Sallee Lange A [[id number]]   allopurinol (ZYLOPRIM) 300 MG tablet    Sig: Take 1 tablet (300 mg total) by mouth daily for gout.    Dispense:  90 tablet    Refill:  0    Order Specific Question:   Supervising Provider    Answer:   Sallee Lange A [[id number]]   amitriptyline (ELAVIL) 50 MG tablet    Sig: TAKE 1 TABLET BY MOUTH AT BEDTIME    Dispense:  90 tablet    Refill:  0    Order Specific Question:   Supervising Provider    Answer:   Sallee Lange A [[id number]]   pantoprazole (PROTONIX) 40 MG tablet    Sig: Take one tablet by mouth daily.    Dispense:  90 tablet    Refill:  0    Order Specific Question:   Supervising Provider    Answer:  Sallee Lange A [[id number]]     Follow Up Instructions: Continue current medications as directed. Xanax 0.5 mg was increased to 30/month up from 20/month. Discussed importance of stress reduction. No new daily medications were added for anxiety due to current dose of amitriptyline 50 mg for her neuropathy. Medications were reviewed to make sure patient had adequate refills during transition at the office. Return in about 3 months (around 01/30/2021) for diabetes check up.    I discussed the assessment and treatment plan with the patient. The patient was provided an opportunity to ask questions and all were answered. The patient agreed with the plan and demonstrated an understanding of the instructions.   The patient was advised to call back or seek an in-person evaluation if the symptoms worsen or if the condition fails to improve as anticipated.  I provided 15 minutes of  non-face-to-face time during this encounter.     Review of Systems     Objective:   Physical Exam        Assessment & Plan:

## 2020-11-01 ENCOUNTER — Encounter: Payer: Self-pay | Admitting: Nurse Practitioner

## 2020-11-24 ENCOUNTER — Other Ambulatory Visit (HOSPITAL_COMMUNITY): Payer: Self-pay

## 2020-11-24 MED ORDER — ZOSTER VAC RECOMB ADJUVANTED 50 MCG/0.5ML IM SUSR
INTRAMUSCULAR | 1 refills | Status: DC
Start: 2020-11-24 — End: 2021-01-30
  Filled 2020-12-11: qty 0.5, 1d supply, fill #0

## 2020-11-25 ENCOUNTER — Other Ambulatory Visit (HOSPITAL_COMMUNITY): Payer: Self-pay

## 2020-11-30 ENCOUNTER — Other Ambulatory Visit: Payer: Self-pay | Admitting: Family Medicine

## 2020-11-30 MED FILL — Varenicline Tartrate Nasal Soln 0.03 MG/ACT: NASAL | 30 days supply | Qty: 8.4 | Fill #3 | Status: CN

## 2020-12-01 ENCOUNTER — Other Ambulatory Visit (HOSPITAL_COMMUNITY): Payer: Self-pay

## 2020-12-01 MED ORDER — FREESTYLE LITE TEST VI STRP
ORAL_STRIP | 0 refills | Status: DC
  Filled 2020-12-01: qty 100, 50d supply, fill #0

## 2020-12-01 MED ORDER — FREESTYLE LANCETS MISC
0 refills | Status: DC
  Filled 2020-12-01: qty 100, 50d supply, fill #0

## 2020-12-01 MED FILL — Varenicline Tartrate Nasal Soln 0.03 MG/ACT: NASAL | 30 days supply | Qty: 8.4 | Fill #3 | Status: AC

## 2020-12-01 MED FILL — Lidocaine-Prilocaine Cream 2.5-2.5%: CUTANEOUS | 30 days supply | Qty: 30 | Fill #2 | Status: AC

## 2020-12-02 ENCOUNTER — Other Ambulatory Visit (HOSPITAL_COMMUNITY): Payer: Self-pay

## 2020-12-03 ENCOUNTER — Other Ambulatory Visit (HOSPITAL_COMMUNITY): Payer: Self-pay

## 2020-12-04 ENCOUNTER — Other Ambulatory Visit (HOSPITAL_COMMUNITY): Payer: Self-pay

## 2020-12-11 ENCOUNTER — Other Ambulatory Visit (HOSPITAL_COMMUNITY): Payer: Self-pay

## 2020-12-12 ENCOUNTER — Other Ambulatory Visit (HOSPITAL_COMMUNITY): Payer: Self-pay

## 2020-12-17 ENCOUNTER — Other Ambulatory Visit (HOSPITAL_COMMUNITY): Payer: Self-pay

## 2020-12-17 ENCOUNTER — Ambulatory Visit: Payer: 59 | Admitting: Nutrition

## 2020-12-18 ENCOUNTER — Other Ambulatory Visit (HOSPITAL_COMMUNITY): Payer: Self-pay

## 2020-12-22 ENCOUNTER — Other Ambulatory Visit (HOSPITAL_COMMUNITY): Payer: Self-pay

## 2020-12-22 ENCOUNTER — Telehealth: Payer: 59 | Admitting: Family Medicine

## 2020-12-22 DIAGNOSIS — M1A0720 Idiopathic chronic gout, left ankle and foot, without tophus (tophi): Secondary | ICD-10-CM

## 2020-12-22 MED ORDER — IBUPROFEN 600 MG PO TABS
600.0000 mg | ORAL_TABLET | Freq: Three times a day (TID) | ORAL | 0 refills | Status: DC | PRN
Start: 2020-12-22 — End: 2021-01-30

## 2020-12-22 MED ORDER — COLCHICINE 0.6 MG PO TABS
ORAL_TABLET | ORAL | 0 refills | Status: DC
Start: 2020-12-22 — End: 2021-01-30

## 2020-12-22 NOTE — Progress Notes (Signed)
E-Visit for Gout Symptoms  We are sorry that you are not feeling well. We are here to help!  Based on what you shared with me it looks like you have a flare of your gout.  Gout is a form of arthritis. It can cause pain and swelling in the joints. At first, it tends to affect only 1 joint - most frequently the big toe. It happens in people who have too much uric acid in the blood. Uric acid is a chemical that is produced when the body breaks down certain foods. Uric acid can form sharp needle-like crystals that build up in the joints and cause pain. Uric acid crystals can also form inside the tubes that carry urine from the kidneys to the bladder. These crystals can turn into "kidney stones" that can cause pain and problems with the flow of urine. People with gout get sudden "flares" or attacks of severe pain, most often the big toe, ankle, or knee. Often the joint also turns red and swells. Usually, only 1 joint is affected, but some people have pain in more than 1 joint. Gout flares tend to happen more often during the night.  The pain from gout can be extreme. The pain and swelling are worst at the beginning of a gout flare. The symptoms then get better within a few days to weeks. It is not clear how the body "turns off" a gout flare.  Do not start any NEW preventative medicine until the gout has cleared completely. However, If you are already on Probenecid or Allopurinol for CHRONIC gout, you may continue taking this during an active flare up  I have prescribed Ibuprofen 600 mg three times daily as needed for mild pain for 7 days and I have prescribed Colchicine 0.6 mg tabs - Take 2 tabs immediately, then 1 tab twice per day for the duration of the flare up to a max of 7 days (but discontinue for stomach pains or diarrhea)    HOME CARE Losing weight can help relieve gout. It's not clear that following a specific diet plan will help with gout symptoms but eating a balanced diet can help improve your  overall health. It can also help you lose weight, if you are overweight. In general, a healthy diet includes plenty of fruits, vegetables, whole grains, and low-fat dairy products (labelled "low fat", skim, 2%). Avoid sugar sweetened drinks (including sodas, tea, juice and juice blends, coffee drinks and sports drinks) Limit alcohol to 1-2 drinks of beer, spirits or wine daily these can make gout flares worse. Some people with gout also have other health problems, such as heart disease, high blood pressure, kidney disease, or obesity. If you have any of these issues, it's important to work with your doctor to manage them. This can help improve your overall health and might also help with your gout.  GET HELP RIGHT AWAY IF: Your symptoms persist after you have completed your treatment plan You develop severe diarrhea You develop abnormal sensations  You develop vomiting,   You develop weakness  You develop abdominal pain  FOLLOW UP WITH YOUR PRIMARY PROVIDER IF: If your symptoms do not improve within 10 days  MAKE SURE YOU  Understand these instructions. Will watch your condition. Will get help right away if you are not doing well or get worse.  Thank you for choosing an e-visit.  Your e-visit answers were reviewed by a board certified advanced clinical practitioner to complete your personal care plan. Depending upon  the condition, your plan could have included both over the counter or prescription medications.  Please review your pharmacy choice. Make sure the pharmacy is open so you can pick up prescription now. If there is a problem, you may contact your provider through [employer] and have the prescription routed to another pharmacy.  Your safety is important to [place]. If you have drug allergies check your prescription carefully.   For the next 24 hours you can use MyChart to ask questions about today's visit, request a non-urgent call back, or ask for a work or school excuse. You  will get an email in the next two days asking about your experience. I hope that your e-visit has been valuable and will speed your recovery.  I provided 5 minutes of non face-to-face time during this encounter for chart review, medication and order placement, as well as and documentation.

## 2020-12-24 ENCOUNTER — Other Ambulatory Visit (HOSPITAL_COMMUNITY): Payer: Self-pay

## 2020-12-25 ENCOUNTER — Encounter: Payer: Self-pay | Admitting: Nurse Practitioner

## 2020-12-26 ENCOUNTER — Other Ambulatory Visit: Payer: Self-pay | Admitting: Nurse Practitioner

## 2020-12-26 ENCOUNTER — Other Ambulatory Visit (HOSPITAL_COMMUNITY): Payer: Self-pay

## 2020-12-26 MED ORDER — TIRZEPATIDE 2.5 MG/0.5ML ~~LOC~~ SOAJ
2.5000 mg | SUBCUTANEOUS | 0 refills | Status: DC
  Filled 2020-12-26: qty 2, 28d supply, fill #0

## 2020-12-26 MED FILL — Lidocaine-Prilocaine Cream 2.5-2.5%: CUTANEOUS | 30 days supply | Qty: 30 | Fill #3 | Status: AC

## 2020-12-28 ENCOUNTER — Other Ambulatory Visit: Payer: Self-pay | Admitting: Family Medicine

## 2020-12-29 ENCOUNTER — Other Ambulatory Visit (HOSPITAL_COMMUNITY): Payer: Self-pay

## 2020-12-29 MED ORDER — FREESTYLE LITE TEST VI STRP
ORAL_STRIP | 0 refills | Status: DC
  Filled 2020-12-29: qty 100, fill #0
  Filled 2021-01-07 – 2021-01-08 (×2): qty 100, 50d supply, fill #0

## 2021-01-07 ENCOUNTER — Other Ambulatory Visit: Payer: Self-pay | Admitting: Nurse Practitioner

## 2021-01-07 ENCOUNTER — Other Ambulatory Visit (HOSPITAL_COMMUNITY): Payer: Self-pay

## 2021-01-07 ENCOUNTER — Telehealth: Payer: Self-pay | Admitting: Family Medicine

## 2021-01-07 DIAGNOSIS — E785 Hyperlipidemia, unspecified: Secondary | ICD-10-CM

## 2021-01-07 DIAGNOSIS — M1A0720 Idiopathic chronic gout, left ankle and foot, without tophus (tophi): Secondary | ICD-10-CM

## 2021-01-07 DIAGNOSIS — E1142 Type 2 diabetes mellitus with diabetic polyneuropathy: Secondary | ICD-10-CM

## 2021-01-07 DIAGNOSIS — Z79899 Other long term (current) drug therapy: Secondary | ICD-10-CM

## 2021-01-07 NOTE — Telephone Encounter (Signed)
Patient has appointment with [name] 12/9 and requested to have all of her labs done, A1C, Cholesterol, before appointment. Please advise.  CB#:  [phone]

## 2021-01-07 NOTE — Telephone Encounter (Signed)
Last labs completed 09/02/20 Uric Acid, A1C, Lipid, CMP and CBC. Please advise. Thank you

## 2021-01-08 ENCOUNTER — Other Ambulatory Visit (HOSPITAL_COMMUNITY): Payer: Self-pay

## 2021-01-08 DIAGNOSIS — H5213 Myopia, bilateral: Secondary | ICD-10-CM | POA: Diagnosis not present

## 2021-01-09 ENCOUNTER — Other Ambulatory Visit (HOSPITAL_COMMUNITY): Payer: Self-pay

## 2021-01-10 ENCOUNTER — Other Ambulatory Visit (HOSPITAL_COMMUNITY): Payer: Self-pay

## 2021-01-12 ENCOUNTER — Other Ambulatory Visit (HOSPITAL_COMMUNITY): Payer: Self-pay

## 2021-01-12 MED ORDER — ALPRAZOLAM 0.5 MG PO TABS
ORAL_TABLET | ORAL | 0 refills | Status: DC
  Filled 2021-01-12: qty 30, 30d supply, fill #0

## 2021-01-12 NOTE — Telephone Encounter (Signed)
Lab orders placed and my chart message sent to patient.  

## 2021-01-26 ENCOUNTER — Other Ambulatory Visit (HOSPITAL_COMMUNITY): Payer: Self-pay

## 2021-01-26 DIAGNOSIS — M1A0720 Idiopathic chronic gout, left ankle and foot, without tophus (tophi): Secondary | ICD-10-CM | POA: Diagnosis not present

## 2021-01-26 DIAGNOSIS — E785 Hyperlipidemia, unspecified: Secondary | ICD-10-CM | POA: Diagnosis not present

## 2021-01-26 DIAGNOSIS — Z79899 Other long term (current) drug therapy: Secondary | ICD-10-CM | POA: Diagnosis not present

## 2021-01-26 DIAGNOSIS — E1142 Type 2 diabetes mellitus with diabetic polyneuropathy: Secondary | ICD-10-CM | POA: Diagnosis not present

## 2021-01-27 LAB — LIPID PANEL
Chol/HDL Ratio: 5.3 ratio — ABNORMAL HIGH (ref 0.0–4.4)
Cholesterol, Total: 207 mg/dL — ABNORMAL HIGH (ref 100–199)
HDL: 39 mg/dL — ABNORMAL LOW (ref >39)
LDL Chol Calc (NIH): 119 mg/dL — ABNORMAL HIGH (ref 0–99)
Triglycerides: 280 mg/dL — ABNORMAL HIGH (ref 0–149)
VLDL Cholesterol Cal: 49 mg/dL — ABNORMAL HIGH (ref 5–40)

## 2021-01-27 LAB — COMPREHENSIVE METABOLIC PANEL
ALT: 49 IU/L — ABNORMAL HIGH (ref 0–32)
AST: 74 IU/L — ABNORMAL HIGH (ref 0–40)
Albumin/Globulin Ratio: 2.1 (ref 1.2–2.2)
Albumin: 4.5 g/dL (ref 3.8–4.9)
Alkaline Phosphatase: 67 IU/L (ref 44–121)
BUN/Creatinine Ratio: 16 (ref 9–23)
BUN: 10 mg/dL (ref 6–24)
Bilirubin Total: 0.3 mg/dL (ref 0.0–1.2)
CO2: 26 mmol/L (ref 20–29)
Calcium: 9.4 mg/dL (ref 8.7–10.2)
Chloride: 100 mmol/L (ref 96–106)
Creatinine, Ser: 0.62 mg/dL (ref 0.57–1.00)
Globulin, Total: 2.1 g/dL (ref 1.5–4.5)
Glucose: 144 mg/dL — ABNORMAL HIGH (ref 70–99)
Potassium: 4.4 mmol/L (ref 3.5–5.2)
Sodium: 142 mmol/L (ref 134–144)
Total Protein: 6.6 g/dL (ref 6.0–8.5)
eGFR: 108 mL/min/{1.73_m2} (ref >59)

## 2021-01-27 LAB — HEMOGLOBIN A1C
Est. average glucose Bld gHb Est-mCnc: 169 mg/dL
Hgb A1c MFr Bld: 7.5 % — ABNORMAL HIGH (ref 4.8–5.6)

## 2021-01-27 LAB — URIC ACID: Uric Acid: 5.1 mg/dL (ref 3.0–7.2)

## 2021-01-30 ENCOUNTER — Other Ambulatory Visit (HOSPITAL_COMMUNITY): Payer: Self-pay

## 2021-01-30 ENCOUNTER — Other Ambulatory Visit: Payer: Self-pay

## 2021-01-30 ENCOUNTER — Ambulatory Visit: Payer: 59 | Admitting: Nurse Practitioner

## 2021-01-30 VITALS — BP 143/86 | HR 111 | Temp 98.7°F | Ht 65.0 in | Wt 197.2 lb

## 2021-01-30 DIAGNOSIS — E65 Localized adiposity: Secondary | ICD-10-CM

## 2021-01-30 DIAGNOSIS — E781 Pure hyperglyceridemia: Secondary | ICD-10-CM

## 2021-01-30 DIAGNOSIS — E1142 Type 2 diabetes mellitus with diabetic polyneuropathy: Secondary | ICD-10-CM

## 2021-01-30 DIAGNOSIS — K760 Fatty (change of) liver, not elsewhere classified: Secondary | ICD-10-CM | POA: Diagnosis not present

## 2021-01-30 DIAGNOSIS — K219 Gastro-esophageal reflux disease without esophagitis: Secondary | ICD-10-CM | POA: Diagnosis not present

## 2021-01-30 DIAGNOSIS — E538 Deficiency of other specified B group vitamins: Secondary | ICD-10-CM

## 2021-01-30 DIAGNOSIS — E782 Mixed hyperlipidemia: Secondary | ICD-10-CM

## 2021-01-30 DIAGNOSIS — M1A0720 Idiopathic chronic gout, left ankle and foot, without tophus (tophi): Secondary | ICD-10-CM

## 2021-01-30 DIAGNOSIS — G608 Other hereditary and idiopathic neuropathies: Secondary | ICD-10-CM

## 2021-01-30 MED ORDER — TIRZEPATIDE 5 MG/0.5ML ~~LOC~~ SOAJ
5.0000 mg | SUBCUTANEOUS | 0 refills | Status: DC
  Filled 2021-01-30: qty 2, 28d supply, fill #0

## 2021-01-30 MED ORDER — CYCLOBENZAPRINE HCL 10 MG PO TABS
ORAL_TABLET | ORAL | 1 refills | Status: DC
  Filled 2021-01-30: qty 90, fill #0
  Filled 2021-03-16: qty 90, 90d supply, fill #0
  Filled 2021-07-23: qty 90, 90d supply, fill #1

## 2021-01-30 MED ORDER — GABAPENTIN 600 MG PO TABS
600.0000 mg | ORAL_TABLET | Freq: Four times a day (QID) | ORAL | 1 refills | Status: DC | PRN
  Filled 2021-01-30 – 2021-03-15 (×2): qty 360, 90d supply, fill #0
  Filled 2021-06-14: qty 360, 90d supply, fill #1

## 2021-01-30 MED ORDER — CYANOCOBALAMIN 1000 MCG/ML IJ SOLN
1000.0000 ug | INTRAMUSCULAR | 0 refills | Status: DC
  Filled 2021-01-30 – 2021-03-15 (×2): qty 3, 90d supply, fill #0

## 2021-01-30 MED ORDER — PANTOPRAZOLE SODIUM 40 MG PO TBEC
DELAYED_RELEASE_TABLET | Freq: Every day | ORAL | 0 refills | Status: DC
  Filled 2021-01-30: qty 90, fill #0
  Filled 2021-03-15: qty 90, 90d supply, fill #0

## 2021-01-30 MED ORDER — ALLOPURINOL 300 MG PO TABS
300.0000 mg | ORAL_TABLET | Freq: Every day | ORAL | 0 refills | Status: DC
  Filled 2021-01-30 – 2021-04-13 (×2): qty 90, 90d supply, fill #0

## 2021-01-30 MED ORDER — ROSUVASTATIN CALCIUM 10 MG PO TABS
10.0000 mg | ORAL_TABLET | Freq: Every day | ORAL | 1 refills | Status: DC
  Filled 2021-01-30 – 2021-03-15 (×2): qty 90, 90d supply, fill #0
  Filled 2021-05-31: qty 90, 90d supply, fill #1

## 2021-01-30 MED ORDER — AMITRIPTYLINE HCL 50 MG PO TABS
ORAL_TABLET | Freq: Every day | ORAL | 0 refills | Status: DC
  Filled 2021-01-30: qty 90, fill #0
  Filled 2021-03-15: qty 90, 90d supply, fill #0

## 2021-01-30 MED ORDER — ALPRAZOLAM 0.5 MG PO TABS
ORAL_TABLET | ORAL | 0 refills | Status: DC
  Filled 2021-01-30: qty 30, fill #0
  Filled 2021-03-15: qty 30, 30d supply, fill #0

## 2021-01-30 MED ORDER — METFORMIN HCL 1000 MG PO TABS
1000.0000 mg | ORAL_TABLET | Freq: Two times a day (BID) | ORAL | 1 refills | Status: DC
  Filled 2021-01-30 – 2021-04-19 (×2): qty 180, 90d supply, fill #0
  Filled 2021-07-12: qty 180, 90d supply, fill #1

## 2021-01-30 MED ORDER — IBUPROFEN 800 MG PO TABS
800.0000 mg | ORAL_TABLET | Freq: Three times a day (TID) | ORAL | 0 refills | Status: DC | PRN
  Filled 2021-01-30: qty 90, 30d supply, fill #0

## 2021-01-30 MED ORDER — CHLORTHALIDONE 25 MG PO TABS
25.0000 mg | ORAL_TABLET | Freq: Every day | ORAL | 0 refills | Status: DC
  Filled 2021-01-30: qty 90, 90d supply, fill #0

## 2021-01-30 NOTE — Progress Notes (Signed)
Subjective:    Patient ID: [name], female    DOB: 02/13/1971, 50 y.o.   MRN: [id number]  No problems or concerns  Diabetes She presents for her follow-up diabetic visit. She has type 2 diabetes mellitus. Pertinent negatives for diabetes include no chest pain. Risk factors for coronary artery disease include diabetes mellitus and hypertension. Current diabetic treatment includes oral agent (monotherapy) (Mounjaro). She is compliant with treatment all of the time.  Patient states her blood sugar at home fasting is running 100-120.  The highest was 215 which was related to food intake.  Denies any adverse effects from [facility].  Has had some trouble with edema especially in her hands and occasionally in the abdominal area.  Has been doing much better with her diet.  Drinks 4 to 6 glasses of water a day.  Enough swelling that is difficult to wear her rings.  Requesting a refill on her ibuprofen which she takes on a rare basis for her gout.  Had 1 flareup recently but overall symptoms are well controlled with allopurinol. While doing well with her diet, states she has limited activity or exercise.   Review of Systems  Respiratory:  Negative for cough, chest tightness, shortness of breath and wheezing.   Cardiovascular:  Negative for chest pain and leg swelling.       Has a chronic history of tachycardia, asymptomatic.  No orthopnea.      Objective:   Physical Exam NAD.  Alert, oriented.  Thyroid nontender to palpation, no mass or goiter noted.  Lungs clear.  Heart regular rate and rhythm.  Abdomen moderately obese but soft, slight bulging mid abdomen consistent with an abdominal hernia otherwise no masses noted.  No rebound or guarding.  Both hands have mild edema.  Lower extremities no edema. Diabetic Foot Exam - Simple   Simple Foot Form Visual Inspection No deformities, no ulcerations, no other skin breakdown bilaterally: Yes Sensation Testing See comments: Yes Pulse Check See  comments: Yes Comments No edema in feet or ankles. Strong DP pulse. Toes warm with normal cap refill. Minimal to no sensation in the toes of both feet.      Results for orders placed or performed in visit on 01/07/21  Comprehensive Metabolic Panel (CMET)  Result Value Ref Range   Glucose 144 (H) 70 - 99 mg/dL   BUN 10 6 - 24 mg/dL   Creatinine, Ser 0.62 0.57 - 1.00 mg/dL   eGFR 108 >59 mL/min/1.73   BUN/Creatinine Ratio 16 9 - 23   Sodium 142 134 - 144 mmol/L   Potassium 4.4 3.5 - 5.2 mmol/L   Chloride 100 96 - 106 mmol/L   CO2 26 20 - 29 mmol/L   Calcium 9.4 8.7 - 10.2 mg/dL   Total Protein 6.6 6.0 - 8.5 g/dL   Albumin 4.5 3.8 - 4.9 g/dL   Globulin, Total 2.1 1.5 - 4.5 g/dL   Albumin/Globulin Ratio 2.1 1.2 - 2.2   Bilirubin Total 0.3 0.0 - 1.2 mg/dL   Alkaline Phosphatase 67 44 - 121 IU/L   AST 74 (H) 0 - 40 IU/L   ALT 49 (H) 0 - 32 IU/L  Lipid Profile  Result Value Ref Range   Cholesterol, Total 207 (H) 100 - 199 mg/dL   Triglycerides 280 (H) 0 - 149 mg/dL   HDL 39 (L) >39 mg/dL   VLDL Cholesterol Cal 49 (H) 5 - 40 mg/dL   LDL Chol Calc (NIH) 119 (H) 0 - 99  mg/dL   Chol/HDL Ratio 5.3 (H) 0.0 - 4.4 ratio  Hemoglobin A1c  Result Value Ref Range   Hgb A1c MFr Bld 7.5 (H) 4.8 - 5.6 %   Est. average glucose Bld gHb Est-mCnc 169 mg/dL  Uric acid  Result Value Ref Range   Uric Acid 5.1 3.0 - 7.2 mg/dL   Reviewed lab work with patient.       Assessment & Plan:   Problem List Items Addressed This Visit       Digestive   Esophageal reflux   Relevant Medications   pantoprazole (PROTONIX) 40 MG tablet   Other Relevant Orders   Ambulatory referral to Gastroenterology   NAFL (nonalcoholic fatty liver)   Relevant Orders   Ambulatory referral to Gastroenterology     Endocrine   Type 2 diabetes mellitus with diabetic polyneuropathy, without long-term current use of insulin (HCC) - Primary   Relevant Medications   tirzepatide (MOUNJARO) 5 MG/0.5ML Pen    amitriptyline (ELAVIL) 50 MG tablet   ALPRAZolam (XANAX) 0.5 MG tablet   cyclobenzaprine (FLEXERIL) 10 MG tablet   gabapentin (NEURONTIN) 600 MG tablet   metFORMIN (GLUCOPHAGE) 1000 MG tablet   rosuvastatin (CRESTOR) 10 MG tablet     Nervous and Auditory   Peripheral sensory neuropathy   Relevant Medications   amitriptyline (ELAVIL) 50 MG tablet   ALPRAZolam (XANAX) 0.5 MG tablet   cyclobenzaprine (FLEXERIL) 10 MG tablet   gabapentin (NEURONTIN) 600 MG tablet     Musculoskeletal and Integument   Chronic idiopathic gout involving toe of left foot without tophus   Relevant Medications   allopurinol (ZYLOPRIM) 300 MG tablet   cyclobenzaprine (FLEXERIL) 10 MG tablet   ibuprofen (ADVIL) 800 MG tablet     Other   B12 deficiency   Central obesity   Relevant Medications   tirzepatide (MOUNJARO) 5 MG/0.5ML Pen   metFORMIN (GLUCOPHAGE) 1000 MG tablet   Hypertriglyceridemia   Relevant Medications   rosuvastatin (CRESTOR) 10 MG tablet   chlorthalidone (HYGROTON) 25 MG tablet   Mixed hyperlipidemia   Relevant Medications   rosuvastatin (CRESTOR) 10 MG tablet   chlorthalidone (HYGROTON) 25 MG tablet      Both her lipid profile and liver enzymes are significantly higher than her previous lab work. Continue rosuvastatin at current dose. Referral back to gastroenterology for evaluation of GERD and elevated liver enzymes. Meds ordered this encounter  Medications   tirzepatide (MOUNJARO) 5 MG/0.5ML Pen    Sig: Inject 5 mg into the skin once a week.    Dispense:  2 mL    Refill:  0    Order Specific Question:   Supervising Provider    Answer:   Lilyan Punt A [[id number]]   allopurinol (ZYLOPRIM) 300 MG tablet    Sig: Take 1 tablet (300 mg total) by mouth daily for gout.    Dispense:  90 tablet    Refill:  0    Order Specific Question:   Supervising Provider    Answer:   Lilyan Punt A [[id number]]   amitriptyline (ELAVIL) 50 MG tablet    Sig: TAKE 1 TABLET BY MOUTH AT BEDTIME     Dispense:  90 tablet    Refill:  0    Order Specific Question:   Supervising Provider    Answer:   Lilyan Punt A [[id number]]   ALPRAZolam (XANAX) 0.5 MG tablet    Sig: Take 1/2-1 tablet by mouth at bedtime as needed for sleep    Dispense:  30 tablet    Refill:  0    Order Specific Question:   Supervising Provider    Answer:   Sallee Lange A [[id number]]   cyanocobalamin (,VITAMIN B-12,) 1000 MCG/ML injection    Sig: Inject 1 mL (1,000 mcg total) into the muscle every 30 (thirty) days.    Dispense:  3 mL    Refill:  0    Please provide a 3 mL syringe & a 23 gauge 1 inch needle with every dosage each month. Dispense 90 d supply.    Order Specific Question:   Supervising Provider    Answer:   Sallee Lange A [[id number]]   cyclobenzaprine (FLEXERIL) 10 MG tablet    Sig: TAKE 1 TABLET BY MOUTH AT BEDTIME AS NEEDED FOR MUSCLE SPASMS    Dispense:  90 tablet    Refill:  1    Order Specific Question:   Supervising Provider    Answer:   Sallee Lange A [[id number]]   gabapentin (NEURONTIN) 600 MG tablet    Sig: Take 1 tablet (600 mg total) by mouth every 6 (six) hours as needed for pain.    Dispense:  360 tablet    Refill:  1    Order Specific Question:   Supervising Provider    Answer:   Sallee Lange A [[id number]]   metFORMIN (GLUCOPHAGE) 1000 MG tablet    Sig: Take 1 tablet (1,000 mg total) by mouth 2 (two) times daily before a meal.    Dispense:  180 tablet    Refill:  1    Order Specific Question:   Supervising Provider    Answer:   Sallee Lange A [[id number]]   pantoprazole (PROTONIX) 40 MG tablet    Sig: Take one tablet by mouth daily.    Dispense:  90 tablet    Refill:  0    Order Specific Question:   Supervising Provider    Answer:   Sallee Lange A [[id number]]   rosuvastatin (CRESTOR) 10 MG tablet    Sig: Take 1 tablet (10 mg total) by mouth at bedtime.    Dispense:  90 tablet    Refill:  1    Order Specific Question:   Supervising Provider    Answer:   Sallee Lange A [[id number]]   ibuprofen (ADVIL) 800 MG  tablet    Sig: Take 1 tablet (800 mg total) by mouth every 8 (eight) hours as needed.    Dispense:  90 tablet    Refill:  0    Order Specific Question:   Supervising Provider    Answer:   Sallee Lange A [[id number]]   chlorthalidone (HYGROTON) 25 MG tablet    Sig: Take 1 tablet (25 mg total) by mouth daily as needed for swelling    Dispense:  90 tablet    Refill:  0    Order Specific Question:   Supervising Provider    Answer:   Sallee Lange A [[id number]]   Refills given on patient's regular medications. Increase Mounjaro to 5 mg weekly.  Call back if any adverse effects.  Plan to slowly increase this over time if needed and tolerated. Discussed importance of healthy diabetic diet and encouraged regular activity such as walking.  Advised patient to go back to the lifestyle changes she was doing few months ago when her lab work was much improved. Chlorthalidone as directed as needed edema. Plan to recheck labs at her next visit in 3 months if no significant improvement, will increase her Crestor  at that time. Return in about 3 months (around 04/30/2021) for Recheck.

## 2021-01-31 ENCOUNTER — Encounter: Payer: Self-pay | Admitting: Nurse Practitioner

## 2021-02-02 ENCOUNTER — Encounter (INDEPENDENT_AMBULATORY_CARE_PROVIDER_SITE_OTHER): Payer: Self-pay | Admitting: *Deleted

## 2021-02-03 ENCOUNTER — Other Ambulatory Visit (HOSPITAL_COMMUNITY): Payer: Self-pay

## 2021-02-17 ENCOUNTER — Telehealth: Payer: 59 | Admitting: Nurse Practitioner

## 2021-02-17 DIAGNOSIS — M25529 Pain in unspecified elbow: Secondary | ICD-10-CM

## 2021-02-17 NOTE — Progress Notes (Signed)
Based on what you shared with me, I feel your condition warrants further evaluation and I recommend that you be seen in a face to face visit.   NOTE: There will be NO CHARGE for this eVisit   If you are having a true medical emergency please call 911.      For an urgent face to face visit, [facility] has six urgent care centers for your convenience:     [facility] [place] Get Driving Directions [phone] [address] [place], [place] [address]    [facility]) Get Driving Directions [phone] [place], [place] [address]  [facility] [place] [facility]) Get Driving Directions [phone] [address] [place],  [place]  [address]  [facility] Get Driving Directions [phone] [address] [place], [place], [place] [address]   [facility] Get Driving Directions  [phone] [address] [place], [place] [address]   [facility] [place] Get Driving Directions [phone] [address], [place],  [address]  Your MyChart E-visit questionnaire answers were reviewed by a board certified advanced clinical practitioner to complete your personal care plan based on your specific symptoms.  Thank you for using e-Visits.

## 2021-02-18 ENCOUNTER — Other Ambulatory Visit: Payer: Self-pay

## 2021-02-18 ENCOUNTER — Ambulatory Visit: Payer: 59 | Admitting: Family Medicine

## 2021-02-18 ENCOUNTER — Other Ambulatory Visit (HOSPITAL_COMMUNITY): Payer: Self-pay

## 2021-02-18 DIAGNOSIS — M7711 Lateral epicondylitis, right elbow: Secondary | ICD-10-CM | POA: Diagnosis not present

## 2021-02-18 MED ORDER — DICLOFENAC SODIUM 75 MG PO TBEC
75.0000 mg | DELAYED_RELEASE_TABLET | Freq: Two times a day (BID) | ORAL | 0 refills | Status: DC
  Filled 2021-02-18: qty 30, 15d supply, fill #0

## 2021-02-18 MED ORDER — OMEGA-3-ACID ETHYL ESTERS 1 G PO CAPS
2.0000 | ORAL_CAPSULE | Freq: Two times a day (BID) | ORAL | 1 refills | Status: DC
  Filled 2021-02-18 – 2021-03-22 (×2): qty 360, 90d supply, fill #0

## 2021-02-18 MED ORDER — METHYLPREDNISOLONE ACETATE 40 MG/ML IJ SUSP
20.0000 mg | Freq: Once | INTRAMUSCULAR | Status: AC
Start: 2021-02-18 — End: 2021-02-18
  Administered 2021-02-18: 20 mg via INTRA_ARTICULAR

## 2021-02-18 NOTE — Progress Notes (Addendum)
° °  Subjective:    Patient ID: [name], female    DOB: 1970-10-23, 50 y.o.   MRN: [id number]  HPI R elbow pain x 3 weeks using Voltaren gel , ibuprofen Elbow pain discomfort hurts with certain movements been present for several weeks has tried Voltaren gel has done a lot of Christmas gift wrapping  Review of Systems     Objective:   Physical Exam Elbow tender lateral epicondylitis medial aspect normal range of motion normal       Assessment & Plan:  Lateral epicondylitis Injection with steroid This is a injection of a trigger point. Depo-Medrol 20 mg 0.5 mL, 1 mL of lidocaine without epinephrine Follow-up if progressive troubles warning signs discussed Ice the area for 20 minutes at a time several times per day If not progressive improvement over the next several weeks with stretching recommend consultation with orthopedics possible physical therapy

## 2021-02-18 NOTE — Patient Instructions (Signed)
Tennis Elbow Tennis elbow (lateral epicondylitis) is inflammation of tendons in your outer forearm, near your elbow. Tendons are tissues that connect muscle to bone. When you have tennis elbow, inflammation affects the tendons that you use to bend your wrist and move your hand up. Inflammation occurs in the lower part of the upper arm bone (humerus), where the tendons connect to the bone (lateral epicondyle). Tennis elbow often affects people who play tennis, but anyone may get the condition from repeatedly extending the wrist or turning the forearm. What are the causes? This condition is usually caused by repeatedly extending the wrist, turning the forearm, and using the hands. It can result from sports or work that requires repetitive forearm movements. In some cases, it may be caused by a sudden injury. What increases the risk? You are more likely to develop tennis elbow if you play tennis or another racket sport. You also have a higher risk if you frequently use your hands for work. Besides people who play tennis, others at greater risk include: People who use computers. Architect workers. People who work in [employer]. Musicians. Cooks. Cashiers. What are the signs or symptoms? Symptoms of this condition include: Pain and tenderness in the forearm and the outer part of the elbow. Pain may be felt only when using the arm, or it may be there all the time. A burning feeling that starts in the elbow and spreads down the forearm. A weak grip in the hand. How is this diagnosed? This condition is diagnosed based on your symptoms, your medical history, and a physical exam. You may also have X-rays or an MRI to: Confirm the diagnosis. Look for other issues. Check for tears in the ligaments, muscles, or tendons. How is this treated? Resting and icing your arm is often the first treatment. Your health care provider may also recommend: Medicines to reduce pain and inflammation. These may be in  the form of a pill, topical gels, or shots of a steroid medicine (cortisone). An elbow strap to reduce stress on the area. Physical therapy. This may include massage or exercises or both. An elbow brace to restrict the movements that cause symptoms. If these treatments do not help relieve your symptoms, your health care provider may recommend surgery to remove damaged muscle and reattach healthy muscle to bone. Follow these instructions at home: If you have a brace or strap: Wear the brace or strap as told by your health care provider. Remove it only as told by your health care provider. Check the skin around the brace or strap every day. Tell your health care provider about any concerns. Loosen the brace if your fingers tingle, become numb, or turn cold and blue. Keep the brace clean. If the brace or strap is not waterproof: Do not let it get wet. Cover it with a watertight covering when you take a bath or a shower. Managing pain, stiffness, and swelling  If directed, put ice on the injured area. To do this: If you have a removable brace or strap, remove it as told by your health care provider. Put ice in a plastic bag. Place a towel between your skin and the bag. Leave the ice on for 20 minutes, 2-3 times a day. Remove the ice if your skin turns bright red. This is very important. If you cannot feel pain, heat, or cold, you have a greater risk of damage to the area. Move your fingers often to reduce stiffness and swelling. Activity Rest your elbow and  wrist and avoid activities that cause symptoms as told by your health care provider. Do physical therapy exercises as told by your health care provider. If you lift an object, lift it with your palm facing up. This reduces stress on your elbow. Lifestyle If your tennis elbow is caused by sports, check your equipment and make sure that: You use it correctly. It is good match for you. If your tennis elbow is caused by work or computer  use, take frequent breaks to stretch your arm. Talk with your employer about ways to manage your condition at work. General instructions Take over-the-counter and prescription medicines only as told by your health care provider. Do not use any products that contain nicotine or tobacco. These products include cigarettes, chewing tobacco, and vaping devices, such as e-cigarettes. If you need help quitting, ask your health care provider. Keep all follow-up visits. This is important. How is this prevented? Before and after activity: Warm up and stretch before being active. Cool down and stretch after being active. Give your body time to rest between periods of activity. During activity: Make sure to use equipment that fits you. If you play tennis, put power in your stroke with your lower body. Avoid using your arm only. Maintain physical fitness, including: Strength. Flexibility. Endurance. Do exercises to strengthen the forearm muscles. Contact a health care provider if: You have pain that gets worse or does not get better with treatment. You have numbness or weakness in your forearm, hand, or fingers. Get help right away if: Your pain is severe. You cannot move your wrist. Summary Tennis elbow (lateral epicondylitis) is inflammation of tendons in your outer forearm, near your elbow. Common symptoms include pain and tenderness in your forearm and the outer part of your elbow. This condition is usually caused by repeatedly extending your wrist, turning your forearm, and using your hands. The first treatment is often resting and icing your arm to relieve symptoms. Further treatment may include taking medicine, getting physical therapy, wearing a brace or strap, or having surgery. This information is not intended to replace advice given to you by your health care provider. Make sure you discuss any questions you have with your health care provider. Document Revised: 08/21/2019 Document  Reviewed: 08/21/2019 Elsevier Patient Education  [place].

## 2021-02-20 ENCOUNTER — Ambulatory Visit: Payer: 59 | Admitting: Nurse Practitioner

## 2021-02-26 ENCOUNTER — Encounter: Payer: Self-pay | Admitting: Nurse Practitioner

## 2021-02-27 ENCOUNTER — Other Ambulatory Visit (HOSPITAL_COMMUNITY): Payer: Self-pay

## 2021-02-27 ENCOUNTER — Other Ambulatory Visit: Payer: Self-pay | Admitting: Nurse Practitioner

## 2021-02-27 MED ORDER — TIRZEPATIDE 7.5 MG/0.5ML ~~LOC~~ SOAJ
7.5000 mg | SUBCUTANEOUS | 2 refills | Status: DC
  Filled 2021-02-27: qty 2, 28d supply, fill #0

## 2021-03-05 ENCOUNTER — Other Ambulatory Visit (HOSPITAL_COMMUNITY): Payer: Self-pay

## 2021-03-05 MED ORDER — AZITHROMYCIN 250 MG PO TABS
ORAL_TABLET | ORAL | 0 refills | Status: DC
  Filled 2021-03-05: qty 6, 5d supply, fill #0

## 2021-03-06 ENCOUNTER — Other Ambulatory Visit: Payer: Self-pay | Admitting: Nurse Practitioner

## 2021-03-06 ENCOUNTER — Encounter: Payer: Self-pay | Admitting: Family Medicine

## 2021-03-06 ENCOUNTER — Other Ambulatory Visit (HOSPITAL_COMMUNITY): Payer: Self-pay

## 2021-03-06 MED ORDER — DICLOFENAC SODIUM 75 MG PO TBEC
75.0000 mg | DELAYED_RELEASE_TABLET | Freq: Two times a day (BID) | ORAL | 0 refills | Status: DC
  Filled 2021-03-06: qty 30, 15d supply, fill #0

## 2021-03-06 MED ORDER — TIRZEPATIDE 10 MG/0.5ML ~~LOC~~ SOAJ
10.0000 mg | SUBCUTANEOUS | 2 refills | Status: DC
  Filled 2021-03-06: qty 2, 28d supply, fill #0

## 2021-03-07 ENCOUNTER — Telehealth: Payer: 59 | Admitting: Nurse Practitioner

## 2021-03-07 DIAGNOSIS — J029 Acute pharyngitis, unspecified: Secondary | ICD-10-CM

## 2021-03-07 NOTE — Progress Notes (Signed)
°  E-Visit for Sore Throat  We are sorry that you are not feeling well.  Here is how we plan to help!  Your symptoms indicate a likely viral infection (Pharyngitis).   Pharyngitis is inflammation in the back of the throat which can cause a sore throat, scratchiness and sometimes difficulty swallowing.   Pharyngitis is typically caused by a respiratory virus and will just run its course.  Please keep in mind that your symptoms could last up to 10 days.  For throat pain, we recommend over the counter oral pain relief medications such as acetaminophen or aspirin, or anti-inflammatory medications such as ibuprofen or naproxen sodium.  Topical treatments such as oral throat lozenges or sprays may be used as needed.  Avoid close contact with loved ones, especially the very young and elderly.  Remember to wash your hands thoroughly throughout the day as this is the number one way to prevent the spread of infection and wipe down door knobs and counters with disinfectant.  After careful review of your answers, I would not recommend an antibiotic for your condition.  Antibiotics should not be used to treat conditions that we suspect are caused by viruses like the virus that causes the common cold or flu. However, some people can have Strep with atypical symptoms. You may need formal testing in clinic or office to confirm if your symptoms continue or worsen.  Providers prescribe antibiotics to treat infections caused by bacteria. Antibiotics are very powerful in treating bacterial infections when they are used properly.  To maintain their effectiveness, they should be used only when necessary.  Overuse of antibiotics has resulted in the development of super bugs that are resistant to treatment!    Home Care: Only take medications as instructed by your medical team. Do not drink alcohol while taking these medications. A steam or ultrasonic humidifier can help congestion.  You can place a towel over your head and  breathe in the steam from hot water coming from a faucet. Avoid close contacts especially the very young and the elderly. Cover your mouth when you cough or sneeze. Always remember to wash your hands.  Get Help Right Away If: You develop worsening fever or throat pain. You develop a severe head ache or visual changes. Your symptoms persist after you have completed your treatment plan.  Make sure you Understand these instructions. Will watch your condition. Will get help right away if you are not doing well or get worse.   Thank you for choosing an e-visit.  Your e-visit answers were reviewed by a board certified advanced clinical practitioner to complete your personal care plan. Depending upon the condition, your plan could have included both over the counter or prescription medications.  Please review your pharmacy choice. Make sure the pharmacy is open so you can pick up prescription now. If there is a problem, you may contact your provider through [employer] and have the prescription routed to another pharmacy.  Your safety is important to [place]. If you have drug allergies check your prescription carefully.   For the next 24 hours you can use MyChart to ask questions about today's visit, request a non-urgent call back, or ask for a work or school excuse. You will get an email in the next two days asking about your experience. I hope that your e-visit has been valuable and will speed your recovery.  5-10 minutes spent reviewing and documenting in chart.

## 2021-03-09 ENCOUNTER — Other Ambulatory Visit: Payer: Self-pay

## 2021-03-09 ENCOUNTER — Other Ambulatory Visit (HOSPITAL_COMMUNITY): Payer: Self-pay

## 2021-03-10 ENCOUNTER — Other Ambulatory Visit (HOSPITAL_COMMUNITY): Payer: Self-pay

## 2021-03-10 MED ORDER — TYRVAYA 0.03 MG/ACT NA SOLN
NASAL | 12 refills | Status: AC
  Filled 2021-03-10: qty 8.4, 30d supply, fill #0
  Filled 2021-04-13: qty 8.4, 30d supply, fill #1
  Filled 2021-07-12: qty 8.4, 30d supply, fill #2
  Filled 2021-08-11: qty 8.4, 30d supply, fill #3
  Filled 2021-09-16 – 2021-09-17 (×3): qty 8.4, 30d supply, fill #4
  Filled 2021-10-11: qty 8.4, 30d supply, fill #5
  Filled 2021-11-12: qty 8.4, 30d supply, fill #6
  Filled 2021-12-04 – 2021-12-07 (×2): qty 8.4, 30d supply, fill #7
  Filled 2022-01-02: qty 8.4, 30d supply, fill #8
  Filled 2022-02-09: qty 8.4, 30d supply, fill #9
  Filled 2022-03-05: qty 8.4, 30d supply, fill #10

## 2021-03-11 ENCOUNTER — Other Ambulatory Visit (HOSPITAL_COMMUNITY): Payer: Self-pay

## 2021-03-15 ENCOUNTER — Other Ambulatory Visit: Payer: Self-pay | Admitting: Family Medicine

## 2021-03-16 ENCOUNTER — Other Ambulatory Visit (HOSPITAL_COMMUNITY): Payer: Self-pay

## 2021-03-16 MED ORDER — "BD LUER-LOK SYRINGE 23G X 1"" 3 ML MISC"
0 refills | Status: DC
  Filled 2021-03-16: qty 12, 365d supply, fill #0

## 2021-03-16 MED ORDER — FREESTYLE LITE TEST VI STRP
ORAL_STRIP | 0 refills | Status: DC
  Filled 2021-03-16: qty 100, 50d supply, fill #0

## 2021-03-16 MED ORDER — FREESTYLE LITE W/DEVICE KIT
PACK | 0 refills | Status: DC
  Filled 2021-03-16: qty 1, 30d supply, fill #0

## 2021-03-17 ENCOUNTER — Other Ambulatory Visit (HOSPITAL_COMMUNITY): Payer: Self-pay

## 2021-03-17 MED ORDER — ZOSTER VAC RECOMB ADJUVANTED 50 MCG/0.5ML IM SUSR
INTRAMUSCULAR | 1 refills | Status: DC
  Filled 2021-04-03: qty 0.5, 1d supply, fill #0

## 2021-03-22 ENCOUNTER — Other Ambulatory Visit (HOSPITAL_COMMUNITY): Payer: Self-pay

## 2021-03-22 ENCOUNTER — Other Ambulatory Visit: Payer: Self-pay | Admitting: Nurse Practitioner

## 2021-03-23 ENCOUNTER — Other Ambulatory Visit (HOSPITAL_COMMUNITY): Payer: Self-pay

## 2021-03-23 MED ORDER — DICLOFENAC SODIUM 75 MG PO TBEC
75.0000 mg | DELAYED_RELEASE_TABLET | Freq: Two times a day (BID) | ORAL | 0 refills | Status: DC
  Filled 2021-03-23: qty 30, 15d supply, fill #0

## 2021-03-24 ENCOUNTER — Other Ambulatory Visit (HOSPITAL_COMMUNITY): Payer: Self-pay

## 2021-03-26 ENCOUNTER — Telehealth: Payer: 59 | Admitting: Physician Assistant

## 2021-03-26 ENCOUNTER — Other Ambulatory Visit (HOSPITAL_COMMUNITY): Payer: Self-pay

## 2021-03-26 DIAGNOSIS — R21 Rash and other nonspecific skin eruption: Secondary | ICD-10-CM

## 2021-03-26 MED ORDER — TRIAMCINOLONE ACETONIDE 0.1 % EX CREA
1.0000 "application " | TOPICAL_CREAM | Freq: Two times a day (BID) | CUTANEOUS | 0 refills | Status: DC
  Filled 2021-03-26: qty 30, 14d supply, fill #0

## 2021-03-26 NOTE — Progress Notes (Signed)
E Visit for Rash  We are sorry that you are not feeling well. Here is how we plan to help!  This looks like a mild allergic dermatitis. Giving this I am going to start you on a topical steroid cream to apply twice daily over the next 7-10 days to help fully resolve. Ok to continue Benadryl, if needed, for itch.    HOME CARE:  Take cool showers and avoid direct sunlight. Apply cool compress or wet dressings. Take a bath in an oatmeal bath.  Sprinkle content of one Aveeno packet under running faucet with comfortably warm water.  Bathe for 15-20 minutes, 1-2 times daily.  Pat dry with a towel. Do not rub the rash. Use hydrocortisone cream. Take an antihistamine like Benadryl for widespread rashes that itch.  The adult dose of Benadryl is 25-50 mg by mouth 4 times daily. Caution:  This type of medication may cause sleepiness.  Do not drink alcohol, drive, or operate dangerous machinery while taking antihistamines.  Do not take these medications if you have prostate enlargement.  Read package instructions thoroughly on all medications that you take.  GET HELP RIGHT AWAY IF:  Symptoms don't go away after treatment. Severe itching that persists. If you rash spreads or swells. If you rash begins to smell. If it blisters and opens or develops a yellow-brown crust. You develop a fever. You have a sore throat. You become short of breath.  MAKE SURE YOU:  Understand these instructions. Will watch your condition. Will get help right away if you are not doing well or get worse.  Thank you for choosing an e-visit.  Your e-visit answers were reviewed by a board certified advanced clinical practitioner to complete your personal care plan. Depending upon the condition, your plan could have included both over the counter or prescription medications.  Please review your pharmacy choice. Make sure the pharmacy is open so you can pick up prescription now. If there is a problem, you may contact your  provider through [employer] and have the prescription routed to another pharmacy.  Your safety is important to [place]. If you have drug allergies check your prescription carefully.   For the next 24 hours you can use MyChart to ask questions about today's visit, request a non-urgent call back, or ask for a work or school excuse. You will get an email in the next two days asking about your experience. I hope that your e-visit has been valuable and will speed your recovery.

## 2021-03-26 NOTE — Progress Notes (Signed)
I have spent 5 minutes in review of e-visit questionnaire, review and updating patient chart, medical decision making and response to patient.   [place] Eesa Justiss, PA-C    

## 2021-04-01 ENCOUNTER — Encounter: Payer: Self-pay | Admitting: Nurse Practitioner

## 2021-04-03 ENCOUNTER — Other Ambulatory Visit: Payer: Self-pay | Admitting: Nurse Practitioner

## 2021-04-03 ENCOUNTER — Other Ambulatory Visit (HOSPITAL_COMMUNITY): Payer: Self-pay

## 2021-04-03 MED ORDER — TIRZEPATIDE 12.5 MG/0.5ML ~~LOC~~ SOAJ
12.5000 mg | SUBCUTANEOUS | 2 refills | Status: DC
  Filled 2021-04-03: qty 2, 28d supply, fill #0

## 2021-04-10 ENCOUNTER — Other Ambulatory Visit (HOSPITAL_COMMUNITY): Payer: Self-pay

## 2021-04-13 ENCOUNTER — Other Ambulatory Visit: Payer: Self-pay | Admitting: Nurse Practitioner

## 2021-04-13 ENCOUNTER — Other Ambulatory Visit (HOSPITAL_COMMUNITY): Payer: Self-pay

## 2021-04-14 ENCOUNTER — Other Ambulatory Visit (HOSPITAL_COMMUNITY): Payer: Self-pay

## 2021-04-16 ENCOUNTER — Other Ambulatory Visit (HOSPITAL_COMMUNITY): Payer: Self-pay

## 2021-04-17 ENCOUNTER — Other Ambulatory Visit (HOSPITAL_COMMUNITY): Payer: Self-pay

## 2021-04-17 MED ORDER — IBUPROFEN 800 MG PO TABS
800.0000 mg | ORAL_TABLET | Freq: Three times a day (TID) | ORAL | 0 refills | Status: DC | PRN
  Filled 2021-04-17: qty 90, 30d supply, fill #0

## 2021-04-17 MED ORDER — LIDOCAINE-PRILOCAINE 2.5-2.5 % EX CREA
TOPICAL_CREAM | CUTANEOUS | 6 refills | Status: DC
  Filled 2021-04-17: qty 30, 30d supply, fill #0
  Filled 2021-07-23: qty 30, 30d supply, fill #1

## 2021-04-20 ENCOUNTER — Other Ambulatory Visit (HOSPITAL_COMMUNITY): Payer: Self-pay

## 2021-04-21 ENCOUNTER — Encounter: Payer: Self-pay | Admitting: Family Medicine

## 2021-04-21 ENCOUNTER — Other Ambulatory Visit (HOSPITAL_COMMUNITY): Payer: Self-pay

## 2021-04-21 ENCOUNTER — Other Ambulatory Visit: Payer: Self-pay

## 2021-04-21 DIAGNOSIS — M25529 Pain in unspecified elbow: Secondary | ICD-10-CM

## 2021-04-21 MED ORDER — DICLOFENAC SODIUM 75 MG PO TBEC
75.0000 mg | DELAYED_RELEASE_TABLET | Freq: Two times a day (BID) | ORAL | 0 refills | Status: DC | PRN
  Filled 2021-04-21: qty 60, 30d supply, fill #0

## 2021-04-21 NOTE — Telephone Encounter (Signed)
Nurses-she may have 30-day prescription, diclofenac 75 mg 1 taken twice daily as needed for elbow pain #60  If not improving over the next several weeks neck step would be orthopedics-let us know we would help set up the appointment thanks

## 2021-04-23 ENCOUNTER — Other Ambulatory Visit (HOSPITAL_COMMUNITY): Payer: Self-pay

## 2021-04-29 ENCOUNTER — Encounter: Payer: Self-pay | Admitting: Nurse Practitioner

## 2021-05-01 ENCOUNTER — Ambulatory Visit: Payer: 59 | Admitting: Nurse Practitioner

## 2021-05-01 ENCOUNTER — Other Ambulatory Visit: Payer: Self-pay | Admitting: Nurse Practitioner

## 2021-05-01 ENCOUNTER — Other Ambulatory Visit (HOSPITAL_COMMUNITY): Payer: Self-pay

## 2021-05-01 DIAGNOSIS — K760 Fatty (change of) liver, not elsewhere classified: Secondary | ICD-10-CM

## 2021-05-01 DIAGNOSIS — E1142 Type 2 diabetes mellitus with diabetic polyneuropathy: Secondary | ICD-10-CM

## 2021-05-01 DIAGNOSIS — E538 Deficiency of other specified B group vitamins: Secondary | ICD-10-CM

## 2021-05-01 DIAGNOSIS — E781 Pure hyperglyceridemia: Secondary | ICD-10-CM

## 2021-05-01 DIAGNOSIS — I10 Essential (primary) hypertension: Secondary | ICD-10-CM

## 2021-05-01 DIAGNOSIS — E782 Mixed hyperlipidemia: Secondary | ICD-10-CM

## 2021-05-01 MED ORDER — TIRZEPATIDE 15 MG/0.5ML ~~LOC~~ SOAJ
15.0000 mg | SUBCUTANEOUS | 0 refills | Status: DC
  Filled 2021-05-01: qty 6, 84d supply, fill #0

## 2021-05-04 ENCOUNTER — Other Ambulatory Visit (HOSPITAL_COMMUNITY): Payer: Self-pay

## 2021-05-11 DIAGNOSIS — E782 Mixed hyperlipidemia: Secondary | ICD-10-CM | POA: Diagnosis not present

## 2021-05-11 DIAGNOSIS — I10 Essential (primary) hypertension: Secondary | ICD-10-CM | POA: Diagnosis not present

## 2021-05-11 DIAGNOSIS — E781 Pure hyperglyceridemia: Secondary | ICD-10-CM | POA: Diagnosis not present

## 2021-05-11 DIAGNOSIS — E1142 Type 2 diabetes mellitus with diabetic polyneuropathy: Secondary | ICD-10-CM | POA: Diagnosis not present

## 2021-05-11 DIAGNOSIS — K760 Fatty (change of) liver, not elsewhere classified: Secondary | ICD-10-CM | POA: Diagnosis not present

## 2021-05-12 LAB — COMPREHENSIVE METABOLIC PANEL
ALT: 40 IU/L — ABNORMAL HIGH (ref 0–32)
AST: 89 IU/L — ABNORMAL HIGH (ref 0–40)
Albumin/Globulin Ratio: 2 (ref 1.2–2.2)
Albumin: 4.6 g/dL (ref 3.8–4.9)
Alkaline Phosphatase: 68 IU/L (ref 44–121)
BUN/Creatinine Ratio: 13 (ref 9–23)
BUN: 10 mg/dL (ref 6–24)
Bilirubin Total: 0.2 mg/dL (ref 0.0–1.2)
CO2: 26 mmol/L (ref 20–29)
Calcium: 9.7 mg/dL (ref 8.7–10.2)
Chloride: 96 mmol/L (ref 96–106)
Creatinine, Ser: 0.75 mg/dL (ref 0.57–1.00)
Globulin, Total: 2.3 g/dL (ref 1.5–4.5)
Glucose: 129 mg/dL — ABNORMAL HIGH (ref 70–99)
Potassium: 4.1 mmol/L (ref 3.5–5.2)
Sodium: 137 mmol/L (ref 134–144)
Total Protein: 6.9 g/dL (ref 6.0–8.5)
eGFR: 96 mL/min/{1.73_m2} (ref >59)

## 2021-05-12 LAB — MICROALBUMIN / CREATININE URINE RATIO
Creatinine, Urine: 60.7 mg/dL
Microalb/Creat Ratio: <5 mg/g creat (ref 0–29)
Microalbumin, Urine: <3 ug/mL

## 2021-05-12 LAB — LIPID PANEL
Chol/HDL Ratio: 3.7 ratio (ref 0.0–4.4)
Cholesterol, Total: 134 mg/dL (ref 100–199)
HDL: 36 mg/dL — ABNORMAL LOW (ref >39)
LDL Chol Calc (NIH): 62 mg/dL (ref 0–99)
Triglycerides: 221 mg/dL — ABNORMAL HIGH (ref 0–149)
VLDL Cholesterol Cal: 36 mg/dL (ref 5–40)

## 2021-05-12 LAB — CBC WITH DIFFERENTIAL/PLATELET
Basophils Absolute: 0.1 10*3/uL (ref 0.0–0.2)
Basos: 1 %
EOS (ABSOLUTE): 0.2 10*3/uL (ref 0.0–0.4)
Eos: 3 %
Hematocrit: 34.2 % (ref 34.0–46.6)
Hemoglobin: 11.4 g/dL (ref 11.1–15.9)
Immature Grans (Abs): 0 10*3/uL (ref 0.0–0.1)
Immature Granulocytes: 0 %
Lymphocytes Absolute: 2.6 10*3/uL (ref 0.7–3.1)
Lymphs: 34 %
MCH: 27.9 pg (ref 26.6–33.0)
MCHC: 33.3 g/dL (ref 31.5–35.7)
MCV: 84 fL (ref 79–97)
Monocytes Absolute: 0.4 10*3/uL (ref 0.1–0.9)
Monocytes: 6 %
Neutrophils Absolute: 4.3 10*3/uL (ref 1.4–7.0)
Neutrophils: 56 %
Platelets: 341 10*3/uL (ref 150–450)
RBC: 4.09 x10E6/uL (ref 3.77–5.28)
RDW: 14.8 % (ref 11.7–15.4)
WBC: 7.7 10*3/uL (ref 3.4–10.8)

## 2021-05-12 LAB — HEMOGLOBIN A1C
Est. average glucose Bld gHb Est-mCnc: 169 mg/dL
Hgb A1c MFr Bld: 7.5 % — ABNORMAL HIGH (ref 4.8–5.6)

## 2021-05-15 ENCOUNTER — Other Ambulatory Visit: Payer: Self-pay

## 2021-05-15 ENCOUNTER — Encounter: Payer: Self-pay | Admitting: Nurse Practitioner

## 2021-05-15 ENCOUNTER — Other Ambulatory Visit (HOSPITAL_COMMUNITY): Payer: Self-pay

## 2021-05-15 ENCOUNTER — Ambulatory Visit: Payer: 59 | Admitting: Nurse Practitioner

## 2021-05-15 VITALS — BP 114/72 | HR 119 | Temp 98.2°F | Ht 65.0 in | Wt 194.0 lb

## 2021-05-15 DIAGNOSIS — J302 Other seasonal allergic rhinitis: Secondary | ICD-10-CM

## 2021-05-15 DIAGNOSIS — G43009 Migraine without aura, not intractable, without status migrainosus: Secondary | ICD-10-CM

## 2021-05-15 DIAGNOSIS — R748 Abnormal levels of other serum enzymes: Secondary | ICD-10-CM

## 2021-05-15 DIAGNOSIS — K760 Fatty (change of) liver, not elsewhere classified: Secondary | ICD-10-CM | POA: Diagnosis not present

## 2021-05-15 DIAGNOSIS — K219 Gastro-esophageal reflux disease without esophagitis: Secondary | ICD-10-CM

## 2021-05-15 DIAGNOSIS — Z1211 Encounter for screening for malignant neoplasm of colon: Secondary | ICD-10-CM

## 2021-05-15 DIAGNOSIS — F5104 Psychophysiologic insomnia: Secondary | ICD-10-CM

## 2021-05-15 DIAGNOSIS — E781 Pure hyperglyceridemia: Secondary | ICD-10-CM

## 2021-05-15 DIAGNOSIS — E1142 Type 2 diabetes mellitus with diabetic polyneuropathy: Secondary | ICD-10-CM

## 2021-05-15 DIAGNOSIS — E782 Mixed hyperlipidemia: Secondary | ICD-10-CM | POA: Diagnosis not present

## 2021-05-15 DIAGNOSIS — E538 Deficiency of other specified B group vitamins: Secondary | ICD-10-CM | POA: Diagnosis not present

## 2021-05-15 MED ORDER — RIZATRIPTAN BENZOATE 10 MG PO TABS
ORAL_TABLET | ORAL | 0 refills | Status: DC
  Filled 2021-05-15: qty 30, 90d supply, fill #0

## 2021-05-15 MED ORDER — OMEGA-3-ACID ETHYL ESTERS 1 G PO CAPS
2.0000 | ORAL_CAPSULE | Freq: Two times a day (BID) | ORAL | 1 refills | Status: DC
  Filled 2021-05-15 – 2021-06-14 (×2): qty 360, 90d supply, fill #0
  Filled 2021-08-11 – 2021-09-16 (×2): qty 360, 90d supply, fill #1

## 2021-05-15 MED ORDER — ALPRAZOLAM 0.5 MG PO TABS
ORAL_TABLET | ORAL | 0 refills | Status: DC
  Filled 2021-05-15: qty 30, 30d supply, fill #0

## 2021-05-15 MED ORDER — TIRZEPATIDE 15 MG/0.5ML ~~LOC~~ SOAJ
15.0000 mg | SUBCUTANEOUS | 1 refills | Status: DC
  Filled 2021-05-15 – 2021-07-18 (×2): qty 6, 84d supply, fill #0
  Filled 2021-10-11: qty 6, 84d supply, fill #1

## 2021-05-15 MED ORDER — FREESTYLE LANCETS MISC
0 refills | Status: DC
  Filled 2021-05-15: qty 100, 50d supply, fill #0

## 2021-05-15 MED ORDER — FAMOTIDINE 40 MG PO TABS
40.0000 mg | ORAL_TABLET | Freq: Every day | ORAL | 0 refills | Status: DC
  Filled 2021-05-15: qty 90, 90d supply, fill #0

## 2021-05-15 MED ORDER — MONTELUKAST SODIUM 10 MG PO TABS
ORAL_TABLET | ORAL | 1 refills | Status: DC
  Filled 2021-05-15: qty 90, 90d supply, fill #0
  Filled 2021-09-21: qty 90, 90d supply, fill #1

## 2021-05-15 MED ORDER — "BD LUER-LOK SYRINGE 23G X 1"" 3 ML MISC"
0 refills | Status: DC
  Filled 2021-05-15: qty 12, fill #0

## 2021-05-15 MED ORDER — DICLOFENAC SODIUM 75 MG PO TBEC
75.0000 mg | DELAYED_RELEASE_TABLET | Freq: Two times a day (BID) | ORAL | 0 refills | Status: DC
  Filled 2021-05-15: qty 30, 15d supply, fill #0

## 2021-05-15 MED ORDER — PANTOPRAZOLE SODIUM 40 MG PO TBEC
DELAYED_RELEASE_TABLET | Freq: Every day | ORAL | 0 refills | Status: DC
  Filled 2021-05-15: qty 90, fill #0
  Filled 2021-06-14: qty 90, 90d supply, fill #0

## 2021-05-15 MED ORDER — FREESTYLE LITE TEST VI STRP
ORAL_STRIP | 0 refills | Status: DC
  Filled 2021-05-15: qty 100, 50d supply, fill #0

## 2021-05-15 MED ORDER — CYANOCOBALAMIN 1000 MCG/ML IJ SOLN
1000.0000 ug | INTRAMUSCULAR | 0 refills | Status: DC
  Filled 2021-05-15 – 2021-07-23 (×2): qty 3, 90d supply, fill #0

## 2021-05-15 NOTE — Progress Notes (Signed)
? ?Subjective:  ? ? Patient ID: [name], female    DOB: Jun 29, 1970, 51 y.o.   MRN: [id number] ? ?Diabetes ?She presents for her follow-up diabetic visit. She has type 2 diabetes mellitus. Hypoglycemia symptoms include headaches. Pertinent negatives for diabetes include no chest pain. Current diabetic treatments: meftformin , mounjaro.  ?Presents for routine 51-month follow-up, review her recent labs and to get refills on her medications.  Continues to have breakthrough reflux symptoms on daily Protonix.  Patient has had difficulty getting appointment at local GI office where she is an established patient.  First appointment was in April but they rescheduled it for late May.  Patient needs to be seen for her reflux as well as a screening colonoscopy.  Also has fatty liver disease. ?Migraines are doing much better.  No change in symptomatology.  Having about 2/month which resolves completely with use of Maxalt. ?Doing well on Mounjaro maximum dose, denies any abdominal pain nausea vomiting or other side effects.  Has cut back to drinking only 1 regular soda per day, otherwise drinking water.  Diet is much healthier. ?Continues to take monthly B12 injections.  Takes occasional Xanax for sleep but tries to limit use of this. ?Of note her gout has been stable on allopurinol.  Takes occasional diclofenac for joint pain.  Adherent to medication regimen. ? ? ?Review of Systems  ?Respiratory:  Negative for cough, chest tightness and shortness of breath.   ?Cardiovascular:  Negative for chest pain, palpitations and leg swelling.  ?     Continues to have right slightly elevated heart rate but no feelings of palpitations.  ?Gastrointestinal:  Negative for abdominal pain, blood in stool, nausea and vomiting.  ?Neurological:  Positive for headaches.  ? ?   ?Objective:  ? Physical Exam ?NAD.  Alert, oriented.  Lungs clear.  Heart regular rate rhythm.  Abdomen obese soft nondistended with continued striate noted.  Nontender.  No  obvious masses but exam limited due to adiposity. ?Today's Vitals  ? 05/15/21 1347  ?BP: 114/72  ?Pulse: (!) 119  ?Temp: 98.2 ?F (36.8 ?C)  ?SpO2: 98%  ?Weight: 194 lb (88 kg)  ?Height: $RemoveB'5\' 5"'XGkhOhKH$  (1.651 m)  ? ?Body mass index is 32.28 kg/m?Marland Kitchen ?Results for orders placed or performed in visit on 05/01/21  ?CBC with Differential/Platelet  ?Result Value Ref Range  ? WBC 7.7 3.4 - 10.8 x10E3/uL  ? RBC 4.09 3.77 - 5.28 x10E6/uL  ? Hemoglobin 11.4 11.1 - 15.9 g/dL  ? Hematocrit 34.2 34.0 - 46.6 %  ? MCV 84 79 - 97 fL  ? MCH 27.9 26.6 - 33.0 pg  ? MCHC 33.3 31.5 - 35.7 g/dL  ? RDW 14.8 11.7 - 15.4 %  ? Platelets 341 150 - 450 x10E3/uL  ? Neutrophils 56 Not Estab. %  ? Lymphs 34 Not Estab. %  ? Monocytes 6 Not Estab. %  ? Eos 3 Not Estab. %  ? Basos 1 Not Estab. %  ? Neutrophils Absolute 4.3 1.4 - 7.0 x10E3/uL  ? Lymphocytes Absolute 2.6 0.7 - 3.1 x10E3/uL  ? Monocytes Absolute 0.4 0.1 - 0.9 x10E3/uL  ? EOS (ABSOLUTE) 0.2 0.0 - 0.4 x10E3/uL  ? Basophils Absolute 0.1 0.0 - 0.2 x10E3/uL  ? Immature Granulocytes 0 Not Estab. %  ? Immature Grans (Abs) 0.0 0.0 - 0.1 x10E3/uL  ?Comprehensive metabolic panel  ?Result Value Ref Range  ? Glucose 129 (H) 70 - 99 mg/dL  ? BUN 10 6 - 24 mg/dL  ?  Creatinine, Ser 0.75 0.57 - 1.00 mg/dL  ? eGFR 96 >59 mL/min/1.73  ? BUN/Creatinine Ratio 13 9 - 23  ? Sodium 137 134 - 144 mmol/L  ? Potassium 4.1 3.5 - 5.2 mmol/L  ? Chloride 96 96 - 106 mmol/L  ? CO2 26 20 - 29 mmol/L  ? Calcium 9.7 8.7 - 10.2 mg/dL  ? Total Protein 6.9 6.0 - 8.5 g/dL  ? Albumin 4.6 3.8 - 4.9 g/dL  ? Globulin, Total 2.3 1.5 - 4.5 g/dL  ? Albumin/Globulin Ratio 2.0 1.2 - 2.2  ? Bilirubin Total 0.2 0.0 - 1.2 mg/dL  ? Alkaline Phosphatase 68 44 - 121 IU/L  ? AST 89 (H) 0 - 40 IU/L  ? ALT 40 (H) 0 - 32 IU/L  ?Hemoglobin A1c  ?Result Value Ref Range  ? Hgb A1c MFr Bld 7.5 (H) 4.8 - 5.6 %  ? Est. average glucose Bld gHb Est-mCnc 169 mg/dL  ?Lipid panel  ?Result Value Ref Range  ? Cholesterol, Total 134 100 - 199 mg/dL  ? Triglycerides  221 (H) 0 - 149 mg/dL  ? HDL 36 (L) >39 mg/dL  ? VLDL Cholesterol Cal 36 5 - 40 mg/dL  ? LDL Chol Calc (NIH) 62 0 - 99 mg/dL  ? Chol/HDL Ratio 3.7 0.0 - 4.4 ratio  ?Urine Microalbumin w/creat. ratio  ?Result Value Ref Range  ? Creatinine, Urine 60.7 Not Estab. mg/dL  ? Microalbumin, Urine <3.0 Not Estab. ug/mL  ? Microalb/Creat Ratio <5 0 - 29 mg/g creat  ? ?Labs reviewed with patient during visit.  Lipid profile much improved from previous visit.  Hemoglobin A1c stable.  Liver enzymes remain mildly elevated. ? ? ? ?   ?Assessment & Plan:  ? ?Problem List Items Addressed This Visit   ? ?  ? Cardiovascular and Mediastinum  ? Migraine without aura and without status migrainosus, not intractable  ? Relevant Medications  ? diclofenac (VOLTAREN) 75 MG EC tablet  ? omega-3 acid ethyl esters (LOVAZA) 1 g capsule  ? rizatriptan (MAXALT) 10 MG tablet  ?  ? Respiratory  ? Seasonal allergic rhinitis  ? Relevant Medications  ? montelukast (SINGULAIR) 10 MG tablet  ?  ? Digestive  ? Esophageal reflux  ? Relevant Medications  ? pantoprazole (PROTONIX) 40 MG tablet  ? famotidine (PEPCID) 40 MG tablet  ? Other Relevant Orders  ? Ambulatory referral to Gastroenterology  ? NAFL (nonalcoholic fatty liver)  ? Relevant Orders  ? Ambulatory referral to Gastroenterology  ?  ? Endocrine  ? Type 2 diabetes mellitus with diabetic polyneuropathy, without long-term current use of insulin (HCC) - Primary  ? Relevant Medications  ? ALPRAZolam (XANAX) 0.5 MG tablet  ? tirzepatide (MOUNJARO) 15 MG/0.5ML Pen  ?  ? Other  ? B12 deficiency  ? Chronic insomnia  ? Elevated liver enzymes  ? Relevant Orders  ? Ambulatory referral to Gastroenterology  ? Hypertriglyceridemia  ? Relevant Medications  ? omega-3 acid ethyl esters (LOVAZA) 1 g capsule  ? Mixed hyperlipidemia  ? Relevant Medications  ? omega-3 acid ethyl esters (LOVAZA) 1 g capsule  ? ?Other Visit Diagnoses   ? ? Screen for colon cancer      ? Relevant Orders  ? Ambulatory referral to  Gastroenterology  ? ?  ? ?Meds ordered this encounter  ?Medications  ? ALPRAZolam (XANAX) 0.5 MG tablet  ?  Sig: Take 1/2 - 1 tablet by mouth at bedtime as needed for sleep  ?  Dispense:  30 tablet  ?  Refill:  0  ? cyanocobalamin (,VITAMIN B-12,) 1000 MCG/ML injection  ?  Sig: Inject 1 mL into the muscle every 30 days.  ?  Dispense:  3 mL  ?  Refill:  0  ?  Please provide a 3 mL syringe & a 23 gauge 1 inch needle with every dosage each month. Dispense 90 d supply.  ? diclofenac (VOLTAREN) 75 MG EC tablet  ?  Sig: Take 1 tablet by mouth 2 times daily as needed for pain  ?  Dispense:  30 tablet  ?  Refill:  0  ?  Please mail  ? glucose blood (FREESTYLE LITE) test strip  ?  Sig: Use twice daily as directed to check blood sugar  ?  Dispense:  100 each  ?  Refill:  0  ? Lancets (FREESTYLE) lancets  ?  Sig: Use twice daily as directed for diabetes.  ?  Dispense:  100 each  ?  Refill:  0  ? montelukast (SINGULAIR) 10 MG tablet  ?  Sig: TAKE 1 TABLET BY MOUTH ONCE A DAY FOR ALLERGIES  ?  Dispense:  90 tablet  ?  Refill:  1  ? omega-3 acid ethyl esters (LOVAZA) 1 g capsule  ?  Sig: TAKE 2 CAPSULES BY MOUTH TWO TIMES DAILY  ?  Dispense:  360 capsule  ?  Refill:  1  ? rizatriptan (MAXALT) 10 MG tablet  ?  Sig: TAKE 1 TABLET BY MOUTH AT ONSET OF MIGRAINE, MAY REPEAT ONCE IN 2 HOURS IF NEEDED **MAX 2 PER 24 HRS  ?  Dispense:  30 tablet  ?  Refill:  0  ? pantoprazole (PROTONIX) 40 MG tablet  ?  Sig: Take 1 tablet by mouth daily.  ?  Dispense:  90 tablet  ?  Refill:  0  ? SYRINGE-NEEDLE, DISP, 3 ML (B-D 3CC LUER-LOK SYR 23GX1") 23G X 1" 3 ML MISC  ?  Sig: Use as directed with B12 injections once every 30 days  ?  Dispense:  12 each  ?  Refill:  0  ? tirzepatide (MOUNJARO) 15 MG/0.5ML Pen  ?  Sig: Inject 15 mg into the skin once a week.  ?  Dispense:  6 mL  ?  Refill:  1  ? famotidine (PEPCID) 40 MG tablet  ?  Sig: Take 1 tablet by mouth at bedtime for acid reflux  ?  Dispense:  90 tablet  ?  Refill:  0  ?  Order Specific  Question:   Supervising Provider  ?  Answer:   Sallee Lange A [[id number]]  ? ?Refill medications.  Continue current dosing as directed. ?Famotidine 40 mg added to regimen at bedtime, continue Protonix as directed.  This i

## 2021-05-16 ENCOUNTER — Encounter: Payer: Self-pay | Admitting: Nurse Practitioner

## 2021-05-16 ENCOUNTER — Other Ambulatory Visit (HOSPITAL_COMMUNITY): Payer: Self-pay

## 2021-05-20 ENCOUNTER — Encounter: Payer: Self-pay | Admitting: Nurse Practitioner

## 2021-05-21 ENCOUNTER — Telehealth: Payer: Self-pay | Admitting: Gastroenterology

## 2021-05-21 NOTE — Telephone Encounter (Signed)
Hi Dr. Tarri Glenn, ? ?Patient requesting you as new GI provider. ? ?I received a referral from PCP for LFTs, Gerd, and screening colonoscopy. Patient was seen at [facility] for [place] in 2021. Per patient, the providers kept cancelling her appointments so she's seeking a new GI provider. Patient states she has never had a colonoscopy. Her records are on Epic to review. Please advise on scheduling.  ? ?Thank you! ? ? ?

## 2021-05-22 ENCOUNTER — Encounter: Payer: Self-pay | Admitting: Gastroenterology

## 2021-05-22 ENCOUNTER — Other Ambulatory Visit (HOSPITAL_COMMUNITY): Payer: Self-pay

## 2021-05-22 ENCOUNTER — Other Ambulatory Visit: Payer: Self-pay | Admitting: Nurse Practitioner

## 2021-05-22 MED ORDER — DICLOFENAC SODIUM 75 MG PO TBEC
75.0000 mg | DELAYED_RELEASE_TABLET | Freq: Two times a day (BID) | ORAL | 0 refills | Status: DC
  Filled 2021-05-22: qty 60, 30d supply, fill #0

## 2021-05-25 ENCOUNTER — Ambulatory Visit (INDEPENDENT_AMBULATORY_CARE_PROVIDER_SITE_OTHER): Payer: 59 | Admitting: Gastroenterology

## 2021-05-27 ENCOUNTER — Other Ambulatory Visit (HOSPITAL_COMMUNITY): Payer: Self-pay

## 2021-05-28 ENCOUNTER — Other Ambulatory Visit (HOSPITAL_COMMUNITY): Payer: Self-pay

## 2021-06-01 ENCOUNTER — Other Ambulatory Visit (HOSPITAL_COMMUNITY): Payer: Self-pay

## 2021-06-02 ENCOUNTER — Ambulatory Visit (INDEPENDENT_AMBULATORY_CARE_PROVIDER_SITE_OTHER): Payer: 59 | Admitting: Gastroenterology

## 2021-06-14 ENCOUNTER — Other Ambulatory Visit: Payer: Self-pay | Admitting: Nurse Practitioner

## 2021-06-14 ENCOUNTER — Encounter: Payer: Self-pay | Admitting: Nurse Practitioner

## 2021-06-15 ENCOUNTER — Other Ambulatory Visit (HOSPITAL_COMMUNITY): Payer: Self-pay

## 2021-06-17 ENCOUNTER — Other Ambulatory Visit: Payer: Self-pay | Admitting: Nurse Practitioner

## 2021-06-17 ENCOUNTER — Other Ambulatory Visit (HOSPITAL_COMMUNITY): Payer: Self-pay

## 2021-06-19 ENCOUNTER — Other Ambulatory Visit: Payer: Self-pay | Admitting: Nurse Practitioner

## 2021-06-19 ENCOUNTER — Other Ambulatory Visit (HOSPITAL_COMMUNITY): Payer: Self-pay

## 2021-06-19 DIAGNOSIS — Z6831 Body mass index (BMI) 31.0-31.9, adult: Secondary | ICD-10-CM | POA: Diagnosis not present

## 2021-06-19 DIAGNOSIS — N959 Unspecified menopausal and perimenopausal disorder: Secondary | ICD-10-CM | POA: Diagnosis not present

## 2021-06-19 DIAGNOSIS — N760 Acute vaginitis: Secondary | ICD-10-CM | POA: Diagnosis not present

## 2021-06-19 DIAGNOSIS — M109 Gout, unspecified: Secondary | ICD-10-CM | POA: Diagnosis not present

## 2021-06-19 DIAGNOSIS — Z79899 Other long term (current) drug therapy: Secondary | ICD-10-CM

## 2021-06-19 DIAGNOSIS — Z139 Encounter for screening, unspecified: Secondary | ICD-10-CM | POA: Diagnosis not present

## 2021-06-19 DIAGNOSIS — Z1231 Encounter for screening mammogram for malignant neoplasm of breast: Secondary | ICD-10-CM | POA: Diagnosis not present

## 2021-06-19 DIAGNOSIS — Z01419 Encounter for gynecological examination (general) (routine) without abnormal findings: Secondary | ICD-10-CM | POA: Diagnosis not present

## 2021-06-19 MED ORDER — AMITRIPTYLINE HCL 50 MG PO TABS
ORAL_TABLET | Freq: Every day | ORAL | 0 refills | Status: DC
  Filled 2021-06-19: qty 90, 90d supply, fill #0

## 2021-06-19 MED ORDER — FLUCONAZOLE 150 MG PO TABS
ORAL_TABLET | ORAL | 1 refills | Status: DC
  Filled 2021-06-19: qty 2, 2d supply, fill #0

## 2021-06-19 MED ORDER — ALPRAZOLAM 0.5 MG PO TABS
ORAL_TABLET | ORAL | 0 refills | Status: DC
  Filled 2021-06-19: qty 30, 30d supply, fill #0

## 2021-06-19 MED ORDER — ALLOPURINOL 300 MG PO TABS
300.0000 mg | ORAL_TABLET | Freq: Two times a day (BID) | ORAL | 0 refills | Status: DC
  Filled 2021-06-19: qty 180, 90d supply, fill #0

## 2021-06-19 MED ORDER — METRONIDAZOLE 500 MG PO TABS
ORAL_TABLET | ORAL | 0 refills | Status: DC
  Filled 2021-06-19: qty 14, 7d supply, fill #0

## 2021-06-22 ENCOUNTER — Other Ambulatory Visit (HOSPITAL_COMMUNITY): Payer: Self-pay

## 2021-06-22 MED ORDER — VITAMIN D3 50 MCG (2000 UT) PO TABS: ORAL_TABLET | ORAL | 3 refills | Status: DC

## 2021-06-23 ENCOUNTER — Encounter: Payer: Self-pay | Admitting: Gastroenterology

## 2021-06-23 ENCOUNTER — Other Ambulatory Visit (HOSPITAL_COMMUNITY): Payer: Self-pay

## 2021-06-23 ENCOUNTER — Ambulatory Visit (INDEPENDENT_AMBULATORY_CARE_PROVIDER_SITE_OTHER): Payer: 59 | Admitting: Gastroenterology

## 2021-06-23 VITALS — BP 118/72 | HR 64 | Ht 65.5 in | Wt 188.0 lb

## 2021-06-23 DIAGNOSIS — K219 Gastro-esophageal reflux disease without esophagitis: Secondary | ICD-10-CM | POA: Diagnosis not present

## 2021-06-23 DIAGNOSIS — Z1211 Encounter for screening for malignant neoplasm of colon: Secondary | ICD-10-CM

## 2021-06-23 DIAGNOSIS — R1319 Other dysphagia: Secondary | ICD-10-CM

## 2021-06-23 DIAGNOSIS — K760 Fatty (change of) liver, not elsewhere classified: Secondary | ICD-10-CM

## 2021-06-23 MED ORDER — NA SULFATE-K SULFATE-MG SULF 17.5-3.13-1.6 GM/177ML PO SOLN
1.0000 | Freq: Once | ORAL | 0 refills | Status: AC
  Filled 2021-06-23: qty 354, 1d supply, fill #0

## 2021-06-23 MED ORDER — PANTOPRAZOLE SODIUM 40 MG PO TBEC
40.0000 mg | DELAYED_RELEASE_TABLET | Freq: Two times a day (BID) | ORAL | 3 refills | Status: DC
  Filled 2021-06-23 – 2021-07-23 (×2): qty 180, 90d supply, fill #0
  Filled 2021-11-12: qty 180, 90d supply, fill #1
  Filled 2022-02-28: qty 180, 90d supply, fill #2
  Filled 2022-05-26: qty 180, 90d supply, fill #3

## 2021-06-23 MED ORDER — FAMOTIDINE 40 MG PO TABS
40.0000 mg | ORAL_TABLET | Freq: Every day | ORAL | 3 refills | Status: DC
Start: 2021-06-23 — End: 2022-06-04
  Filled 2021-06-23 – 2021-08-03 (×2): qty 90, 90d supply, fill #0
  Filled 2021-11-12: qty 90, 90d supply, fill #1
  Filled 2022-02-28: qty 90, 90d supply, fill #2
  Filled 2022-05-26: qty 90, 90d supply, fill #3

## 2021-06-23 NOTE — Progress Notes (Signed)
? ?Referring Provider: Kathyrn Drown, MD ?Primary Care Physician:  Kathyrn Drown, MD ? ? ?Reason for Consultation:  Reflux ? ? ?IMPRESSION:  ? ?Reflux not responding to PPI and H2B with intermittent dysphagia localized to the sternal notch: Increase pantoprazole to 40 mg BID. Proceed with EGD and possible dilation.  ? ?Pill dysphagia ? ?Colon cancer screening. Colonoscopy recommended. ? ?Fatty liver by imaging. Serologic evaluation in 2017-05-02 was largely negative. She is at risk for advanced fibrosis given concurrent diabetes. NASH FibroSure recommended for staging. Briefly discussed lifestyle modifications for NAFLD.  ? ?NSAID-related PUD in 2000/05/02 ? ?Family history of colon polyps: Father.  ? ?PLAN: ?- Increase pantoprazole to 40 mg BID ?- Continue famotidine 40 mg QHS ?- NASH FibroSURE ?- EGD with possible dilation ?- Colonoscopy ? ? ?HPI: [name] is a 51 y.o. female referred by [facility] for further evaluation of reflux, elevated liver enzymes, and fatty liver.  The history is obtained through the patient, her husband who accompanies her to this appointment,  and review of her electronic health record.  She has a history of type 2 diabetes mellitus, gout, B12 deficiency and migraines.  She had her gallbladder removed in 05/03/1999. She was previously seen at the [facility] clinic for gastrointestinal diseases, most recently in 2019/05/03. ? ?Reports many years of reflux despite daily Protonix. Intermittent choking to mostly solids and dysphagia to the sternal notch that has been more worrisome over the last year.  Dysphagia if she misses her PPI. PCP added an evening dose of famotidine 40 mg nightly. The evening dose helps with the eructation but not with the choking. No nocturnal symptoms. Some regurgitation but she can often position herself for the food bolus to pass. Her husband thinks she chokes more under stress. She isn't convinced that it is related to stress. No evidence for GI bleeding, anorexia, unexplained  weight loss, odynophagia, persistent vomiting, dysphonia, sore throat, or neck pain.  ? ?She has a longer history of pill-dysphagia but this has been worse over the last year.  ? ?Has a history of abnormal liver enzymes and fatty liver disease. Found to be elevated in 05/02/2017.  Previously evaluated by the [place] team. Testing in May 03, 2019 included negative/normal AMA, ASMAb, iron, ferritin, Alpha-1-antitrypsin, ceruloplasma, hepatitis B serologies, ANA, TSH, HCV antibody.  ? ?Baseline bowel habits have been irritable bowel with alternating diarrhea and constipation.  Since starting metformin her bowel habits have normalized where she will have a bowel movement daily to every other day.  Has intermittent bloating but no abdominal pain or cramping.  No blood or mucus in the stool. ? ?Testing in 2017-05-02 showed normal TTGA and gliadin antibodyes ? ?Abdominal ultrasound 09/22/2018 showed echogenic liver and evidence for prior cholecystectomy ?CT of the abdomen and pelvis with and without contrast 12/07/2019 to evaluate abdominal pain, distention, unintentional weight gain, and elevated liver enzymes showed echogenic liver no other findings ? ?Labs 05/11/21: normal CMP except for AST 89, ALT 40, CBC normal with platelets 341 ? ?Reports a distant upper endoscopy for NSAID induced ulcers around May 02, 2000.  ? ?Mother died in May 02, 2018. She is now doing more to take care of her father.  ?Multiple stressors taking care of son, grandson, and her in-laws. ? ?Father with colon polyps and need for serial esophageal dilations.  Sister has required esophageal dilations.  There is no other known family history of colon cancer or polyps. No family history of stomach cancer or other GI malignancy. No family  history of inflammatory bowel disease or celiac.  ? ? ?Past Medical History:  ?Diagnosis Date  ? Allergy   ? Anxiety   ? Gout   ? History of migraine headaches   ? IBS (irritable bowel syndrome)   ? Impaired fasting glucose   ? ? ?Past Surgical  History:  ?Procedure Laterality Date  ? ABDOMINAL HYSTERECTOMY  2005  ? partial  ? BLADDER SUSPENSION  2012  ? BREAST REDUCTION SURGERY  2002  ? CHOLECYSTECTOMY  2005  ? EYE SURGERY  1999  ? laser   ? UPPER GASTROINTESTINAL ENDOSCOPY    ? 20 years ago by Dr. Gala Romney. - told she had ulcers  ? ? ?Current Outpatient Medications  ?Medication Sig Dispense Refill  ? allopurinol (ZYLOPRIM) 300 MG tablet Take 1 tablet (300 mg total) by mouth 2 (two) times daily. 180 tablet 0  ? ALPRAZolam (XANAX) 0.5 MG tablet Take 1/2 - 1 tablet by mouth at bedtime as needed for sleep 30 tablet 0  ? amitriptyline (ELAVIL) 50 MG tablet TAKE 1 TABLET BY MOUTH AT BEDTIME 90 tablet 0  ? chlorthalidone (HYGROTON) 25 MG tablet Take 1 tablet (25 mg total) by mouth daily as needed for swelling 90 tablet 0  ? Cholecalciferol (VITAMIN D3) 50 MCG (2000 UT) TABS Take 2 tablets by mouth every day as directed. 180 tablet 3  ? cyanocobalamin (,VITAMIN B-12,) 1000 MCG/ML injection Inject 1 mL into the muscle every 30 days. 3 mL 0  ? cyclobenzaprine (FLEXERIL) 10 MG tablet TAKE 1 TABLET BY MOUTH AT BEDTIME AS NEEDED FOR MUSCLE SPASMS 90 tablet 1  ? famotidine (PEPCID) 40 MG tablet Take 1 tablet (40 mg total) by mouth at bedtime. 90 tablet 3  ? fluconazole (DIFLUCAN) 150 MG tablet Take 1 tablet by mouth every 72 hours as needed for 2 doses. 2 tablet 1  ? gabapentin (NEURONTIN) 600 MG tablet Take 1 tablet (600 mg total) by mouth every 6 (six) hours as needed for pain. 360 tablet 1  ? ibuprofen (ADVIL) 800 MG tablet Take 1 tablet (800 mg total) by mouth every 8 (eight) hours as needed. 90 tablet 0  ? lidocaine-prilocaine (EMLA) cream APPLY TO FEET AS NEEDED FOR NERVE PAIN 30 g 6  ? metFORMIN (GLUCOPHAGE) 1000 MG tablet Take 1 tablet (1,000 mg total) by mouth 2 (two) times daily before a meal. 180 tablet 1  ? montelukast (SINGULAIR) 10 MG tablet TAKE 1 TABLET BY MOUTH ONCE A DAY FOR ALLERGIES 90 tablet 1  ? Na Sulfate-K Sulfate-Mg Sulf 17.5-3.13-1.6 GM/177ML  SOLN Take 1 kit by mouth once for 1 dose. 354 mL 0  ? omega-3 acid ethyl esters (LOVAZA) 1 g capsule TAKE 2 CAPSULES BY MOUTH TWO TIMES DAILY 360 capsule 1  ? pantoprazole (PROTONIX) 40 MG tablet Take 1 tablet (40 mg total) by mouth 2 (two) times daily before a meal. 180 tablet 3  ? rizatriptan (MAXALT) 10 MG tablet TAKE 1 TABLET BY MOUTH AT ONSET OF MIGRAINE, MAY REPEAT ONCE IN 2 HOURS IF NEEDED **MAX 2 PER 24 HRS 30 tablet 0  ? rosuvastatin (CRESTOR) 10 MG tablet Take 1 tablet (10 mg total) by mouth at bedtime. 90 tablet 1  ? tirzepatide (MOUNJARO) 15 MG/0.5ML Pen Inject 15 mg into the skin once a week. 6 mL 1  ? Varenicline Tartrate (TYRVAYA) 0.03 MG/ACT SOLN PLACE 1 SQUIRT INTRANASALLY 2 TIMES DAILY 8.4 mL 12  ? ?No current facility-administered medications for this visit.  ? ? ?Allergies  as of 06/23/2021 - Review Complete 06/23/2021  ?Allergen Reaction Noted  ? Omnicef [cefdinir] Other (See Comments) 10/03/2015  ? Losartan Rash 12/15/2016  ? ? ?Family History  ?Problem Relation Age of Onset  ? Diabetes Mother   ? Neuropathy Mother   ? Heart failure Mother   ? Hypertension Mother   ? Cancer Mother   ? Atrial fibrillation Father   ? Hypertension Father   ? Colonic polyp Father   ? ? ?Social History  ? ?Socioeconomic History  ? Marital status: Married  ?  Spouse name: Not on file  ? Number of children: 2  ? Years of education: Not on file  ? Highest education level: High school graduate  ?Occupational History  ?  Employer: [employer]  ?  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 Smoking status: Never  ? Smokeless tobacco: Never  ?Vaping Use  ? Vaping Use: Never used  ?Substance and Sexual Activity  ? Alcohol use: Yes  ?  Comment: occasional   ? Drug use: Never  ? Sexual activity: Yes  ?  Birth control/protection: Surgical  ?Other Topics Concern  ? Not on file  ?Social History Narrative  ? Lives at home with husband and dog  ? Right handed  ? Caffeine: 3 cups daily  ? ?Social Determinants of Health  ? ?Financial  Resource Strain: Not on file  ?Food Insecurity: Not on file  ?Transportation Needs: Not on file  ?Physical Activity: Not on file  ?Stress: Not on file  ?Social Connections: Not on file  ?Intimate Partner Vi

## 2021-06-23 NOTE — Patient Instructions (Addendum)
It was my pleasure to provide care to you today. Based on our discussion, I am providing you with my recommendations below: ? ?RECOMMENDATION(S):  ? ?PRESCRIPTION MEDICATION(S):  ? ?We have sent the following medication(s) to your pharmacy: ? ?Pantoprazole 40 mg twice daily 30-60 minutes before breakfast and dinner. ?Pepcid 40 mg nightly at bedtime.  ? ?NOTE: If your medication(s) requires a PRIOR AUTHORIZATION, we will receive notification from your pharmacy. Once received, the process to submit for approval may take up to 7-10 business days. You will be contacted about any denials we have received from your insurance company as well as alternatives recommended by your provider. ? ? ?COLONOSCOPY/UPPER ENDOSCOPY:  ? ?You have been scheduled for a colonoscopy/upper endoscopy. Please follow written instructions given to you at your visit today.  ? ?PREP:  ? ?Please pick up your prep supplies at the pharmacy within the next 1-3 days. ? ?INHALERS:  ? ?If you use inhalers (even only as needed), please bring them with you on the day of your procedure. ? ?COLONOSCOPY TIPS: ? ?- Stay well hydrated for 3-4 days prior to the exam. This reduces nausea and dehydration.  ?- To prevent skin/hemorrhoid irritation - prior to wiping, put A&Dointment or vaseline on the toilet paper. ?- Keep a towel or pad on the bed.  ?- Drink  64oz of clear liquids in the morning of prep day (prior to starting the prep) to be sure that there is enough fluid to flush the colon and stay hydrated!!!! This is in addition to the fluids required for preparation. ?- Use of a flavored hard candy, such as grape Anise Salvo, can counteract some of the flavor of the prep and may prevent some nausea.   ? ? ?FOLLOW UP: ? ?After your procedure, you will receive a call from my office staff regarding my recommendation for follow up. ? ?BMI: ? ?If you are age 52 or older, your body mass index should be between 23-30. Your Body mass index is 30.81 kg/m?Marland Kitchen If this is  out of the aforementioned range listed, please consider follow up with your Primary Care Provider. ? ?If you are age 38 or younger, your body mass index should be between 19-25. Your Body mass index is 30.81 kg/m?Marland Kitchen If this is out of the aformentioned range listed, please consider follow up with your Primary Care Provider.  ? ?MY CHART: ? ?The [facility] providers would like to encourage you to use [facility] to communicate with providers for non-urgent requests or questions.  Due to long hold times on the telephone, sending your provider a message by [facility] may be a faster and more efficient way to get a response.  Please allow 48 business hours for a response.  Please remember that this is for non-urgent requests.  ? ?Thank you for trusting me with your gastrointestinal care!   ? ?Thornton Park, MD, MPH ? ?

## 2021-06-25 ENCOUNTER — Other Ambulatory Visit: Payer: Self-pay | Admitting: *Deleted

## 2021-06-25 DIAGNOSIS — K760 Fatty (change of) liver, not elsewhere classified: Secondary | ICD-10-CM

## 2021-06-26 ENCOUNTER — Other Ambulatory Visit (HOSPITAL_COMMUNITY): Payer: Self-pay

## 2021-06-27 ENCOUNTER — Other Ambulatory Visit: Payer: Self-pay | Admitting: Nurse Practitioner

## 2021-06-27 ENCOUNTER — Other Ambulatory Visit (HOSPITAL_COMMUNITY): Payer: Self-pay

## 2021-06-27 MED ORDER — FREESTYLE LIBRE 3 SENSOR MISC
0 refills | Status: DC
Start: 2021-06-27 — End: 2021-07-27
  Filled 2021-06-27: qty 1, 14d supply, fill #0
  Filled 2021-07-21: qty 1, 14d supply, fill #1

## 2021-06-29 ENCOUNTER — Other Ambulatory Visit (HOSPITAL_COMMUNITY): Payer: Self-pay

## 2021-06-29 MED ORDER — GYNAZOLE-1 2 % VA CREA
TOPICAL_CREAM | VAGINAL | 0 refills | Status: DC
  Filled 2021-06-29: qty 5, 1d supply, fill #0

## 2021-06-30 ENCOUNTER — Other Ambulatory Visit: Payer: Self-pay | Admitting: *Deleted

## 2021-06-30 ENCOUNTER — Other Ambulatory Visit (HOSPITAL_COMMUNITY): Payer: Self-pay

## 2021-06-30 ENCOUNTER — Telehealth: Payer: Self-pay | Admitting: Nurse Practitioner

## 2021-06-30 DIAGNOSIS — K760 Fatty (change of) liver, not elsewhere classified: Secondary | ICD-10-CM

## 2021-06-30 MED ORDER — FLUCONAZOLE 100 MG PO TABS
ORAL_TABLET | ORAL | 0 refills | Status: DC
  Filled 2021-06-30: qty 3, 3d supply, fill #0

## 2021-06-30 NOTE — Telephone Encounter (Signed)
Pt dropped off paperwork that she had done in April. Papers on provider desk. Pt is wanting to know if she still needs to come in at the end of June (08/21/21) or wait until July. Pt also wanted to let you know that she is scheduled with Dr.Beavers on 07/10/21. Please advise. Thank you ?

## 2021-06-30 NOTE — Addendum Note (Signed)
Addended by: Aleatha Borer J on: 06/30/2021 12:38 PM ? ? Modules accepted: Orders ? ?

## 2021-07-02 ENCOUNTER — Encounter: Payer: Self-pay | Admitting: Nurse Practitioner

## 2021-07-02 ENCOUNTER — Encounter: Payer: Self-pay | Admitting: Gastroenterology

## 2021-07-02 LAB — NASH FIBROSURE
ALPHA 2-MACROGLOBULINS, QN: 164 mg/dL (ref 110–276)
ALT (SGPT) P5P: 39 IU/L (ref 0–40)
AST (SGOT) P5P: 74 IU/L — ABNORMAL HIGH (ref 0–40)
Apolipoprotein A-1: 124 mg/dL (ref 116–209)
Bilirubin, Total: 0.2 mg/dL (ref 0.0–1.2)
Cholesterol, Total: 116 mg/dL (ref 100–199)
Fibrosis Score: 0.14 (ref 0.00–0.21)
GGT: 126 IU/L — ABNORMAL HIGH (ref 0–60)
Glucose: 117 mg/dL — ABNORMAL HIGH (ref 70–99)
Haptoglobin: 151 mg/dL (ref 33–346)
Height: 66 in
NASH Score: 0.5 — ABNORMAL HIGH
Steatosis Score: 0.85 — ABNORMAL HIGH (ref 0.00–0.30)
Triglycerides: 185 mg/dL — ABNORMAL HIGH (ref 0–149)
Weight: 188 [lb_av]

## 2021-07-02 NOTE — Telephone Encounter (Signed)
Please advise 

## 2021-07-06 ENCOUNTER — Encounter: Payer: Self-pay | Admitting: Gastroenterology

## 2021-07-06 ENCOUNTER — Telehealth: Payer: Self-pay | Admitting: Gastroenterology

## 2021-07-06 NOTE — Telephone Encounter (Signed)
Informed patient she is able to continue her Amitriptyline.  ?

## 2021-07-06 NOTE — Telephone Encounter (Signed)
We received a call from patient seeking advise. Patient is scheduled for procedure 07/10/21. Patient has questions regarding current medications, wants to know what she can/cannot take. Please advise. ?

## 2021-07-06 NOTE — Telephone Encounter (Signed)
Patient has called again.  With her upcoming procedure, patient is asking what medications she CANNOT take.  Amitriptyline is a red pill, so that is one of the ones she's questioning.  Please call her as soon as you can so she will know what to do.  Thank you. ?

## 2021-07-08 ENCOUNTER — Other Ambulatory Visit: Payer: Self-pay | Admitting: Family Medicine

## 2021-07-08 ENCOUNTER — Other Ambulatory Visit: Payer: Self-pay | Admitting: Nurse Practitioner

## 2021-07-08 ENCOUNTER — Other Ambulatory Visit (HOSPITAL_COMMUNITY): Payer: Self-pay

## 2021-07-08 ENCOUNTER — Encounter: Payer: Self-pay | Admitting: Gastroenterology

## 2021-07-10 ENCOUNTER — Encounter: Payer: Self-pay | Admitting: Gastroenterology

## 2021-07-10 ENCOUNTER — Ambulatory Visit (AMBULATORY_SURGERY_CENTER): Payer: 59 | Admitting: Gastroenterology

## 2021-07-10 VITALS — BP 108/73 | HR 104 | Temp 98.0°F | Resp 12 | Ht 65.0 in | Wt 188.0 lb

## 2021-07-10 DIAGNOSIS — D120 Benign neoplasm of cecum: Secondary | ICD-10-CM

## 2021-07-10 DIAGNOSIS — K317 Polyp of stomach and duodenum: Secondary | ICD-10-CM

## 2021-07-10 DIAGNOSIS — K219 Gastro-esophageal reflux disease without esophagitis: Secondary | ICD-10-CM

## 2021-07-10 DIAGNOSIS — K2970 Gastritis, unspecified, without bleeding: Secondary | ICD-10-CM

## 2021-07-10 DIAGNOSIS — K209 Esophagitis, unspecified without bleeding: Secondary | ICD-10-CM

## 2021-07-10 DIAGNOSIS — Z1211 Encounter for screening for malignant neoplasm of colon: Secondary | ICD-10-CM | POA: Diagnosis not present

## 2021-07-10 DIAGNOSIS — R1319 Other dysphagia: Secondary | ICD-10-CM | POA: Diagnosis not present

## 2021-07-10 HISTORY — PX: COLONOSCOPY: SHX174

## 2021-07-10 MED ORDER — SODIUM CHLORIDE 0.9 % IV SOLN: 500.0000 mL | Freq: Once | INTRAVENOUS | Status: DC

## 2021-07-10 NOTE — Progress Notes (Signed)
Pt in recovery with monitors in place, VSS. Report given to receiving RN. Bite guard was placed with pt awake to ensure comfort. No dental or soft tissue damage noted. 

## 2021-07-10 NOTE — Progress Notes (Signed)
Pt's states no medical or surgical changes since previsit or office visit. 

## 2021-07-10 NOTE — Op Note (Signed)
[facility] Patient Name: [name] Procedure Date: 07/10/2021 3:22 PM MRN: [id number] Endoscopist: Thornton Park MD, MD Age: 51 Referring MD:  Date of Birth: 03-21-1970 Gender: Female Account #: [id number] Procedure:                Colonoscopy Indications:              Screening for colorectal malignant neoplasm, This                            is the patient's first colonoscopy                           Father with colon polyps Medicines:                Monitored Anesthesia Care Procedure:                Pre-Anesthesia Assessment:                           - Prior to the procedure, a History and Physical                            was performed, and patient medications and                            allergies were reviewed. The patient's tolerance of                            previous anesthesia was also reviewed. The risks                            and benefits of the procedure and the sedation                            options and risks were discussed with the patient.                            All questions were answered, and informed consent                            was obtained. Prior Anticoagulants: The patient has                            taken no previous anticoagulant or antiplatelet                            agents. ASA Grade Assessment: II - A patient with                            mild systemic disease. After reviewing the risks                            and benefits, the patient was deemed in  satisfactory condition to undergo the procedure.                           After obtaining informed consent, the colonoscope                            was passed under direct vision. Throughout the                            procedure, the patient's blood pressure, pulse, and                            oxygen saturations were monitored continuously. The                            CF [id number] #[id number] was introduced through the anus                             and advanced to the 3 cm into the ileum. A second                            forward view of the right colon was performed. The                            colonoscopy was performed without difficulty. The                            patient tolerated the procedure well. The quality                            of the bowel preparation was good. The terminal                            ileum, ileocecal valve, appendiceal orifice, and                            rectum were photographed. Scope In: 3:52:54 PM Scope Out: 4:05:41 PM Scope Withdrawal Time: 0 hours 9 minutes 36 seconds  Total Procedure Duration: 0 hours 12 minutes 47 seconds  Findings:                 The perianal and digital rectal examinations were                            normal.                           Multiple small and large-mouthed diverticula were                            found in the entire colon.                           A 1 mm polyp was found in the cecum. The polyp was  flat. The polyp was removed with a cold snare.                            Resection and retrieval were complete. Estimated                            blood loss was minimal.                           The exam was otherwise without abnormality on                            direct and retroflexion views except for internal                            hemorrhoids. Complications:            No immediate complications. Estimated Blood Loss:     Estimated blood loss was minimal. Impression:               - Diverticulosis in the entire examined colon.                           - One 1 mm polyp in the cecum, removed with a cold                            snare. Resected and retrieved.                           - The examination was otherwise normal on direct                            and retroflexion views. Recommendation:           - Patient has a contact number available for                             emergencies. The signs and symptoms of potential                            delayed complications were discussed with the                            patient. Return to normal activities tomorrow.                            Written discharge instructions were provided to the                            patient.                           - Continue present medications.                           - Await pathology results.                           -  Repeat colonoscopy date to be determined after                            pending pathology results are reviewed for                            surveillance.                           - Follow a high fiber diet. Drink at least 64                            ounces of water daily. Add a daily stool bulking                            agent such as psyllium (an exampled would be                            Metamucil).                           - Emerging evidence supports eating a diet of                            fruits, vegetables, grains, calcium, and yogurt                            while reducing red meat and alcohol may reduce the                            risk of colon cancer. Thornton Park MD, MD 07/10/2021 4:18:47 PM This report has been signed electronically.

## 2021-07-10 NOTE — Op Note (Signed)
[facility] Patient Name: [name] Procedure Date: 07/10/2021 3:29 PM MRN: [id number] Endoscopist: Thornton Park MD, MD Age: 51 Referring MD:  Date of Birth: March 15, 1970 Gender: Female Account #: [id number] Procedure:                Upper GI endoscopy Indications:              Dysphagia Medicines:                Monitored Anesthesia Care Procedure:                Pre-Anesthesia Assessment:                           - Prior to the procedure, a History and Physical                            was performed, and patient medications and                            allergies were reviewed. The patient's tolerance of                            previous anesthesia was also reviewed. The risks                            and benefits of the procedure and the sedation                            options and risks were discussed with the patient.                            All questions were answered, and informed consent                            was obtained. Prior Anticoagulants: The patient has                            taken no previous anticoagulant or antiplatelet                            agents. ASA Grade Assessment: II - A patient with                            mild systemic disease. After reviewing the risks                            and benefits, the patient was deemed in                            satisfactory condition to undergo the procedure.                           After obtaining informed consent, the endoscope was  passed under direct vision. Throughout the                            procedure, the patient's blood pressure, pulse, and                            oxygen saturations were monitored continuously. The                            Endoscope was introduced through the mouth, and                            advanced to the third part of duodenum. The upper                            GI endoscopy was accomplished without  difficulty.                            The patient tolerated the procedure well. Scope In: Scope Out: Findings:                 No endoscopic abnormality was evident in the                            esophagus to explain the patient's complaint of                            dysphagia. It was decided, however, to proceed with                            dilation of the lower third of the esophagus. A TTS                            dilator was passed through the scope. Dilation with                            a 16-17-18 mm balloon dilator was performed to 18                            mm. There was no resistance to a fully inflated                            balloon. The dilation site was examined and showed                            no change. Biopsies were then obtained from the                            proximal and distal esophagus with cold forceps for                            histology.  Multiple small sessile polyps were found in the                            gastric fundus. Biopsies were taken from four                            polyps with a cold forceps for histology. Estimated                            blood loss was minimal.                           The entire examined stomach was normal. Biopsies                            were taken from the antrum, body, and fundus with a                            cold forceps for histology. Estimated blood loss                            was minimal.                           The examined duodenum was normal. Complications:            No immediate complications. Estimated Blood Loss:     Estimated blood loss was minimal. Impression:               - No endoscopic esophageal abnormality to explain                            patient's dysphagia. Esophagus dilated. Dilated.                            Biopsied.                           - Multiple gastric polyps. Biopsied.                           - Normal  stomach. Biopsied.                           - Normal examined duodenum. Recommendation:           - Patient has a contact number available for                            emergencies. The signs and symptoms of potential                            delayed complications were discussed with the                            patient. Return to normal activities tomorrow.  Written discharge instructions were provided to the                            patient.                           - Resume previous diet.                           - Continue present medications.                           - Await pathology results. Thornton Park MD, MD 07/10/2021 4:15:44 PM This report has been signed electronically.

## 2021-07-10 NOTE — Progress Notes (Signed)
Called to room to assist during endoscopic procedure.  Patient ID and intended procedure confirmed with present staff. Received instructions for my participation in the procedure from the performing physician.  

## 2021-07-10 NOTE — Progress Notes (Signed)
Indications for upper endoscopy colonoscopy: Dysphagia, reflux Indications for colonoscopy: Colon cancer screening  Please see my office visit from 06/23/2021 for complete details.  There is been no significant change in history or physical exam since that time.  The patient remains an appropriate candidate for monitored anesthesia care in the endoscopy center.

## 2021-07-10 NOTE — Patient Instructions (Signed)
Upper Endoscopy:   You may resume your usual diet per Dr Tarri Glenn  Await biopsy results    Colonoscopy:   Handout on polyps ,diverticulosis ,hemorrhoids , & high fiber diet given to you today  Await pathology results on polyp removed    Follow high fiber diet and drink at least 64 ozs of water a day  Add a stool bulking agent such as Metamucil daily    YOU HAD AN ENDOSCOPIC PROCEDURE TODAY AT [facility]:   Refer to the procedure report that was given to you for any specific questions about what was found during the examination.  If the procedure report does not answer your questions, please call your gastroenterologist to clarify.  If you requested that your care partner not be given the details of your procedure findings, then the procedure report has been included in a sealed envelope for you to review at your convenience later.  YOU SHOULD EXPECT: Some feelings of bloating in the abdomen. Passage of more gas than usual.  Walking can help get rid of the air that was put into your GI tract during the procedure and reduce the bloating. If you had a lower endoscopy (such as a colonoscopy or flexible sigmoidoscopy) you may notice spotting of blood in your stool or on the toilet paper. If you underwent a bowel prep for your procedure, you may not have a normal bowel movement for a few days.  Please Note:  You might notice some irritation and congestion in your nose or some drainage.  This is from the oxygen used during your procedure.  There is no need for concern and it should clear up in a day or so.  SYMPTOMS TO REPORT IMMEDIATELY:  Following lower endoscopy (colonoscopy or flexible sigmoidoscopy):  Excessive amounts of blood in the stool  Significant tenderness or worsening of abdominal pains  Swelling of the abdomen that is new, acute  Fever of 100F or higher  Following upper endoscopy (EGD)  Vomiting of blood or coffee ground material  New chest pain or  pain under the shoulder blades  Painful or persistently difficult swallowing  New shortness of breath  Fever of 100F or higher  Black, tarry-looking stools  For urgent or emergent issues, a gastroenterologist can be reached at any hour by calling [phone]. Do not use MyChart messaging for urgent concerns.    DIET:  We do recommend a small meal at first, but then you may proceed to your regular diet.  Drink plenty of fluids but you should avoid alcoholic beverages for 24 hours.  ACTIVITY:  You should plan to take it easy for the rest of today and you should NOT DRIVE or use heavy machinery until tomorrow (because of the sedation medicines used during the test).    FOLLOW UP: Our staff will call the number listed on your records 48-72 hours following your procedure to check on you and address any questions or concerns that you may have regarding the information given to you following your procedure. If we do not reach you, we will leave a message.  We will attempt to reach you two times.  During this call, we will ask if you have developed any symptoms of COVID 19. If you develop any symptoms (ie: fever, flu-like symptoms, shortness of breath, cough etc.) before then, please call [phone].  If you test positive for Covid 19 in the 2 weeks post procedure, please call and report this information to [place].  If any biopsies were taken you will be contacted by phone or by letter within the next 1-3 weeks.  Please call us at [phone] if you have not heard about the biopsies in 3 weeks.    SIGNATURES/CONFIDENTIALITY: You and/or your care partner have signed paperwork which will be entered into your electronic medical record.  These signatures attest to the fact that that the information above on your After Visit Summary has been reviewed and is understood.  Full responsibility of the confidentiality of this discharge information lies with you and/or your care-partner.

## 2021-07-13 ENCOUNTER — Other Ambulatory Visit (HOSPITAL_COMMUNITY): Payer: Self-pay

## 2021-07-13 ENCOUNTER — Telehealth: Payer: Self-pay | Admitting: *Deleted

## 2021-07-13 NOTE — Telephone Encounter (Signed)
Left message on machine to call back  

## 2021-07-13 NOTE — Telephone Encounter (Addendum)
Informed patient of results. Patient states she is still getting choked on pills and would like to know what your recommend about that since she did not need any dilation with her procedure.

## 2021-07-13 NOTE — Telephone Encounter (Signed)
  Follow up Call-     07/10/2021    2:35 PM  Call back number  Post procedure Call Back phone  # [phone]  Permission to leave phone message Yes     Patient questions:  Do you have a fever, pain , or abdominal swelling? No. Pain Score  0 *  Have you tolerated food without any problems? Yes.    Have you been able to return to your normal activities? Yes.    Do you have any questions about your discharge instructions: Diet   No. Medications  No. Follow up visit  No.  Do you have questions or concerns about your Care? Yes.    Actions: * If pain score is 4 or above: No action needed, pain <4.  Pt states that she continues to have trouble swallowing pills.  She is able to swallow food without difficulty but some pills are an issue.  She also would like to know the results of recent blood work.

## 2021-07-14 ENCOUNTER — Ambulatory Visit (INDEPENDENT_AMBULATORY_CARE_PROVIDER_SITE_OTHER): Payer: 59 | Admitting: Gastroenterology

## 2021-07-14 ENCOUNTER — Other Ambulatory Visit (HOSPITAL_COMMUNITY): Payer: Self-pay

## 2021-07-17 ENCOUNTER — Encounter: Payer: Self-pay | Admitting: Gastroenterology

## 2021-07-21 ENCOUNTER — Other Ambulatory Visit (HOSPITAL_COMMUNITY): Payer: Self-pay

## 2021-07-21 ENCOUNTER — Telehealth: Payer: Self-pay

## 2021-07-21 NOTE — Telephone Encounter (Signed)
Caller name:Shakeira Glennon Hamilton   On DPR? :Yes  Call back number:[phone]  Provider they see: [place]   Reason for call:Pt need pour Authorization for FREESTYLE LIBRE 3 SENSOR pt also needs strips they where removed she needs to use them periodically

## 2021-07-21 NOTE — Telephone Encounter (Signed)
PA completed 07/21/21-await decision

## 2021-07-22 ENCOUNTER — Other Ambulatory Visit (HOSPITAL_COMMUNITY): Payer: Self-pay

## 2021-07-23 ENCOUNTER — Other Ambulatory Visit (HOSPITAL_COMMUNITY): Payer: Self-pay

## 2021-07-23 ENCOUNTER — Other Ambulatory Visit: Payer: Self-pay | Admitting: Nurse Practitioner

## 2021-07-24 ENCOUNTER — Other Ambulatory Visit: Payer: Self-pay | Admitting: Nurse Practitioner

## 2021-07-24 ENCOUNTER — Ambulatory Visit: Payer: 59 | Admitting: Nurse Practitioner

## 2021-07-24 ENCOUNTER — Other Ambulatory Visit (HOSPITAL_COMMUNITY): Payer: Self-pay

## 2021-07-24 MED ORDER — FREESTYLE LITE TEST VI STRP
ORAL_STRIP | 0 refills | Status: DC
Start: 2021-07-24 — End: 2021-09-16
  Filled 2021-07-24: qty 100, 50d supply, fill #0

## 2021-07-24 NOTE — Telephone Encounter (Signed)
Patient's PA for [employer] 3 was denied -per insurance continuous glucose monitoring is only covered if patient has insulin pump or take 3 or more injections of insulin a day- also the brand covered is Dexacom G6 if one of the previous 2 requirements are met.

## 2021-07-24 NOTE — Telephone Encounter (Signed)
Patient notified

## 2021-07-25 ENCOUNTER — Encounter: Payer: Self-pay | Admitting: Nurse Practitioner

## 2021-07-25 ENCOUNTER — Other Ambulatory Visit (HOSPITAL_COMMUNITY): Payer: Self-pay

## 2021-07-25 MED ORDER — ALPRAZOLAM 0.5 MG PO TABS
0.2500 mg | ORAL_TABLET | Freq: Every evening | ORAL | 0 refills | Status: DC | PRN
  Filled 2021-07-25: qty 30, 30d supply, fill #0

## 2021-07-25 NOTE — Telephone Encounter (Signed)
My chart message sent to notify patient.

## 2021-07-27 ENCOUNTER — Other Ambulatory Visit (HOSPITAL_COMMUNITY): Payer: Self-pay

## 2021-07-27 MED ORDER — FREESTYLE LIBRE 3 SENSOR MISC
0 refills | Status: DC
  Filled 2021-07-27: qty 2, 28d supply, fill #0

## 2021-07-31 ENCOUNTER — Encounter: Payer: Self-pay | Admitting: Family Medicine

## 2021-07-31 ENCOUNTER — Other Ambulatory Visit (HOSPITAL_COMMUNITY): Payer: Self-pay

## 2021-07-31 ENCOUNTER — Ambulatory Visit: Payer: 59 | Admitting: Nurse Practitioner

## 2021-07-31 VITALS — BP 101/71 | HR 118 | Temp 97.6°F | Ht 65.0 in | Wt 185.8 lb

## 2021-07-31 DIAGNOSIS — E1142 Type 2 diabetes mellitus with diabetic polyneuropathy: Secondary | ICD-10-CM | POA: Diagnosis not present

## 2021-07-31 DIAGNOSIS — F419 Anxiety disorder, unspecified: Secondary | ICD-10-CM

## 2021-07-31 DIAGNOSIS — G608 Other hereditary and idiopathic neuropathies: Secondary | ICD-10-CM

## 2021-07-31 DIAGNOSIS — E538 Deficiency of other specified B group vitamins: Secondary | ICD-10-CM

## 2021-07-31 DIAGNOSIS — R1312 Dysphagia, oropharyngeal phase: Secondary | ICD-10-CM | POA: Diagnosis not present

## 2021-07-31 MED ORDER — LIDOCAINE-PRILOCAINE 2.5-2.5 % EX CREA
TOPICAL_CREAM | CUTANEOUS | 5 refills | Status: DC
Start: 2021-07-31 — End: 2022-01-19
  Filled 2021-07-31: qty 30, fill #0
  Filled 2021-08-11: qty 30, 7d supply, fill #0
  Filled 2021-09-16: qty 30, 7d supply, fill #1
  Filled 2021-10-11: qty 30, 7d supply, fill #2
  Filled 2021-11-12: qty 30, 7d supply, fill #3
  Filled 2021-12-04: qty 30, 7d supply, fill #4
  Filled 2022-01-02: qty 30, 7d supply, fill #5

## 2021-07-31 MED ORDER — FREESTYLE LIBRE 3 SENSOR MISC
1 refills | Status: DC
  Filled 2021-07-31: qty 6, fill #0
  Filled 2021-08-21: qty 6, 84d supply, fill #0
  Filled 2022-06-07: qty 6, 84d supply, fill #1

## 2021-07-31 MED ORDER — METFORMIN HCL 1000 MG PO TABS
1000.0000 mg | ORAL_TABLET | Freq: Two times a day (BID) | ORAL | 1 refills | Status: DC
  Filled 2021-07-31 – 2021-10-11 (×2): qty 180, 90d supply, fill #0

## 2021-07-31 MED ORDER — GABAPENTIN 600 MG PO TABS
600.0000 mg | ORAL_TABLET | Freq: Four times a day (QID) | ORAL | 2 refills | Status: DC | PRN
  Filled 2021-07-31 – 2021-09-16 (×2): qty 360, 90d supply, fill #0

## 2021-07-31 MED ORDER — ALPRAZOLAM 0.5 MG PO TABS
0.2500 mg | ORAL_TABLET | Freq: Every evening | ORAL | 1 refills | Status: DC | PRN
  Filled 2021-07-31 – 2021-09-16 (×2): qty 30, 30d supply, fill #0
  Filled 2021-11-12: qty 30, 30d supply, fill #1

## 2021-07-31 MED ORDER — CYANOCOBALAMIN 1000 MCG/ML IJ SOLN
1000.0000 ug | INTRAMUSCULAR | 0 refills | Status: DC
  Filled 2021-07-31 – 2021-10-11 (×2): qty 3, 90d supply, fill #0

## 2021-07-31 NOTE — Patient Instructions (Addendum)
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

## 2021-07-31 NOTE — Progress Notes (Unsigned)
   Subjective:    Patient ID: [name], female    DOB: 1970/02/25, 51 y.o.   MRN: [id number]  HPI  Patient here to go over results. She says she had a colonoscopy and endoscopy done. She said her esophagus didn't need to be stretched. She wants to know does she need to take Protonix and Pepcid.  Patient lost voice due to breathing in Lysol or combination of Lysol and the endoscopy.  Review of Systems     Objective:   Physical Exam        Assessment & Plan:

## 2021-08-01 ENCOUNTER — Encounter: Payer: Self-pay | Admitting: Nurse Practitioner

## 2021-08-03 ENCOUNTER — Encounter: Payer: Self-pay | Admitting: Family Medicine

## 2021-08-03 ENCOUNTER — Ambulatory Visit: Payer: 59 | Admitting: Family Medicine

## 2021-08-03 ENCOUNTER — Other Ambulatory Visit (HOSPITAL_COMMUNITY): Payer: Self-pay

## 2021-08-03 DIAGNOSIS — J988 Other specified respiratory disorders: Secondary | ICD-10-CM | POA: Insufficient documentation

## 2021-08-03 MED ORDER — AMOXICILLIN-POT CLAVULANATE 875-125 MG PO TABS: 1.0000 | ORAL_TABLET | Freq: Two times a day (BID) | ORAL | 0 refills | Status: DC

## 2021-08-03 MED ORDER — PROMETHAZINE-DM 6.25-15 MG/5ML PO SYRP: 5.0000 mL | ORAL_SOLUTION | Freq: Four times a day (QID) | ORAL | 0 refills | Status: DC | PRN

## 2021-08-03 NOTE — Progress Notes (Signed)
Subjective:  Patient ID: [name], female    DOB: 03-26-1970  Age: 51 y.o. MRN: [id number]  CC: Chief Complaint  Patient presents with   Laryngitis    Hoarsness,green mucus, cough(seems like trying to cough up something but nothing coming up. Mariane Baumgarten on Friday.     HPI:  51 year old female presents for evaluation of respiratory symptoms.  Recently seen on Friday.  Has been experiencing hoarseness.  Has now developed nasal discharge and congestion as well as severe cough.  Has had no improvement with supportive care.  Has been trying to rest her voice.  Voice has slightly improved.  No fever.  No shortness of breath.  She reports discolored nasal discharge.  No other associated symptoms.  No other complaints.  Patient Active Problem List   Diagnosis Date Noted   Respiratory infection 08/03/2021   Chronic idiopathic gout involving toe of left foot without tophus 09/06/2020   Migraine without aura and without status migrainosus, not intractable 04/04/2020   Seasonal allergic rhinitis 03/03/2020   Uncontrolled type 2 diabetes mellitus with hyperglycemia ([place]) 08/06/2019   Type 2 diabetes mellitus with diabetic polyneuropathy, without long-term current use of insulin ([place]) 02/03/2019   Mixed hyperlipidemia 02/03/2019   B12 deficiency 12/18/2018   NAFL (nonalcoholic fatty liver) 66/59/9357   Elevated liver enzymes 09/22/2018   Chronic insomnia 08/19/2018   Central obesity 08/19/2018   Peripheral polyneuropathy 09/20/2017   Acne rosacea 11/20/2016   Hypertension not at goal 03/26/2016   Vitamin D deficiency 10/29/2014   Class 1 obesity due to excess calories with serious comorbidity and body mass index (BMI) of 33.0 to 33.9 in adult 04/05/2014   Esophageal reflux 10/13/2013   Irritable bowel syndrome with constipation 06/25/2013   Anxiety 02/14/2013   Knee pain, chronic 02/14/2013    Social Hx   Social History   Socioeconomic History   Marital status: Married     Spouse name: Not on file   Number of children: 2   Years of education: Not on file   Highest education level: High school graduate  Occupational History    Employer: [employer]    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  Tobacco Use   Smoking status: Never   Smokeless tobacco: Never  Vaping Use   Vaping Use: Never used  Substance and Sexual Activity   Alcohol use: Yes    Comment: occasional    Drug use: Never   Sexual activity: Yes    Birth control/protection: Surgical  Other Topics Concern   Not on file  Social History Narrative   Lives at home with husband and dog   Right handed   Caffeine: 3 cups daily   Social Determinants of Health   Financial Resource Strain: Not on file  Food Insecurity: Not on file  Transportation Needs: Not on file  Physical Activity: Not on file  Stress: Not on file  Social Connections: Not on file    Review of Systems Per HPI  Objective:  BP (!) 142/86   Pulse (!) 117   Temp 97.7 F (36.5 C)   Wt 186 lb 9.6 oz (84.6 kg)   SpO2 97%   BMI 31.05 kg/m      08/03/2021   11:14 AM 07/31/2021   11:11 AM 07/10/2021    4:31 PM  BP/Weight  Systolic BP 017 793 903  Diastolic BP 86 71 73  Wt. (Lbs) 186.6 185.8   BMI 31.05 kg/m2 30.92 kg/m2     Physical Exam  Vitals and nursing note reviewed.  Constitutional:      General: She is not in acute distress.    Appearance: Normal appearance.  HENT:     Head: Normocephalic and atraumatic.     Right Ear: Tympanic membrane normal.     Left Ear: Tympanic membrane normal.     Mouth/Throat:     Pharynx: Posterior oropharyngeal erythema present.  Eyes:     General:        Right eye: No discharge.        Left eye: No discharge.     Conjunctiva/sclera: Conjunctivae normal.  Cardiovascular:     Rate and Rhythm: Normal rate and regular rhythm.  Pulmonary:     Effort: Pulmonary effort is normal.     Breath sounds: Normal breath sounds. No wheezing, rhonchi or rales.  Neurological:     Mental Status: She is  alert.     Lab Results  Component Value Date   WBC 7.7 05/11/2021   HGB 11.4 05/11/2021   HCT 34.2 05/11/2021   PLT 341 05/11/2021   GLUCOSE 129 (H) 05/11/2021   CHOL 116 06/30/2021   TRIG 185 (H) 06/30/2021   HDL 36 (L) 05/11/2021   LDLCALC 62 05/11/2021   ALT 40 (H) 05/11/2021   AST 89 (H) 05/11/2021   NA 137 05/11/2021   K 4.1 05/11/2021   CL 96 05/11/2021   CREATININE 0.75 05/11/2021   BUN 10 05/11/2021   CO2 26 05/11/2021   TSH 0.744 10/05/2019   HGBA1C 7.5 (H) 05/11/2021     Assessment & Plan:   Problem List Items Addressed This Visit       Respiratory   Respiratory infection    Treating with Augmentin and Promethazine DM.  Advised voice rest.  Advise lots of fluids and rest.       Meds ordered this encounter  Medications   amoxicillin-clavulanate (AUGMENTIN) 875-125 MG tablet    Sig: Take 1 tablet by mouth 2 (two) times daily.    Dispense:  14 tablet    Refill:  0   promethazine-dextromethorphan (PROMETHAZINE-DM) 6.25-15 MG/5ML syrup    Sig: Take 5 mLs by mouth 4 (four) times daily as needed for cough.    Dispense:  118 mL    Refill:  [place]

## 2021-08-03 NOTE — Assessment & Plan Note (Signed)
Treating with Augmentin and Promethazine DM.  Advised voice rest.  Advise lots of fluids and rest.

## 2021-08-03 NOTE — Patient Instructions (Signed)
Rest. Fluids.  Voice rest as much as possible.  Medications as directed.  Take care  Dr. Lacinda Axon

## 2021-08-04 ENCOUNTER — Other Ambulatory Visit (HOSPITAL_COMMUNITY): Payer: Self-pay

## 2021-08-11 ENCOUNTER — Other Ambulatory Visit (HOSPITAL_COMMUNITY): Payer: Self-pay

## 2021-08-11 ENCOUNTER — Other Ambulatory Visit: Payer: Self-pay

## 2021-08-11 ENCOUNTER — Encounter: Payer: 59 | Admitting: Gastroenterology

## 2021-08-11 ENCOUNTER — Encounter: Payer: Self-pay | Admitting: Family Medicine

## 2021-08-11 ENCOUNTER — Other Ambulatory Visit: Payer: Self-pay | Admitting: Nurse Practitioner

## 2021-08-11 DIAGNOSIS — I10 Essential (primary) hypertension: Secondary | ICD-10-CM

## 2021-08-11 DIAGNOSIS — E782 Mixed hyperlipidemia: Secondary | ICD-10-CM

## 2021-08-11 DIAGNOSIS — E1142 Type 2 diabetes mellitus with diabetic polyneuropathy: Secondary | ICD-10-CM

## 2021-08-11 DIAGNOSIS — Z79899 Other long term (current) drug therapy: Secondary | ICD-10-CM

## 2021-08-11 DIAGNOSIS — J029 Acute pharyngitis, unspecified: Secondary | ICD-10-CM

## 2021-08-11 MED ORDER — IBUPROFEN 600 MG PO TABS
600.0000 mg | ORAL_TABLET | Freq: Three times a day (TID) | ORAL | 0 refills | Status: DC | PRN
  Filled 2021-08-11: qty 90, 30d supply, fill #0

## 2021-08-11 MED ORDER — CHLORTHALIDONE 25 MG PO TABS
25.0000 mg | ORAL_TABLET | Freq: Every day | ORAL | 0 refills | Status: DC
  Filled 2021-08-11: qty 90, 90d supply, fill #0

## 2021-08-11 NOTE — Telephone Encounter (Signed)
Patient has been informed per drs recommendations below, she states she only takes chlorthalidone as needed and the same with ibuprofen 800 mg , she has been taking it more for her recent pharyngitis. She agrees to switching to ibuprofen 600 mg and labs, however she also states that she has had recent blood work in April scanned in media , when would she need to have the next ordered set of labs.

## 2021-08-11 NOTE — Telephone Encounter (Signed)
Nurses for this medication chlorthalidone I would recommend on the days that she takes it that she also takes a potassium 20 mill equivalent 1 daily I would also recommend that she do follow-up kidney functions to make sure she is tolerating the chlorthalidone She is due for blood work if she is willing to do so I recommend lipid, liver, T5H, metabolic 7 Also as for the ibuprofen please find out how often she is taking this medication?  Current studies show that 600 mg ibuprofen typically will help significantly with less stress on the kidneys compared to 800 mg ibuprofen.  If she is willing to make this switch we can do so.  She should keep her follow-up visit in September with [name] thanks-Dr. Nicki Reaper

## 2021-08-12 ENCOUNTER — Other Ambulatory Visit: Payer: Self-pay | Admitting: Family Medicine

## 2021-08-12 ENCOUNTER — Other Ambulatory Visit (HOSPITAL_COMMUNITY): Payer: Self-pay

## 2021-08-17 ENCOUNTER — Other Ambulatory Visit: Payer: Self-pay | Admitting: Family Medicine

## 2021-08-17 ENCOUNTER — Other Ambulatory Visit (HOSPITAL_COMMUNITY): Payer: Self-pay

## 2021-08-18 IMAGING — US ULTRASOUND ABDOMEN COMPLETE
1 series · 13 of 25 positions shown · non-contrast
Comparison: None.

CLINICAL DATA: Elevated liver enzymes and weight gain

EXAM:
ABDOMEN ULTRASOUND COMPLETE

[Series 1: ultrasound abdomen complete · 13 of 74 slices shown]
[im 1/74]
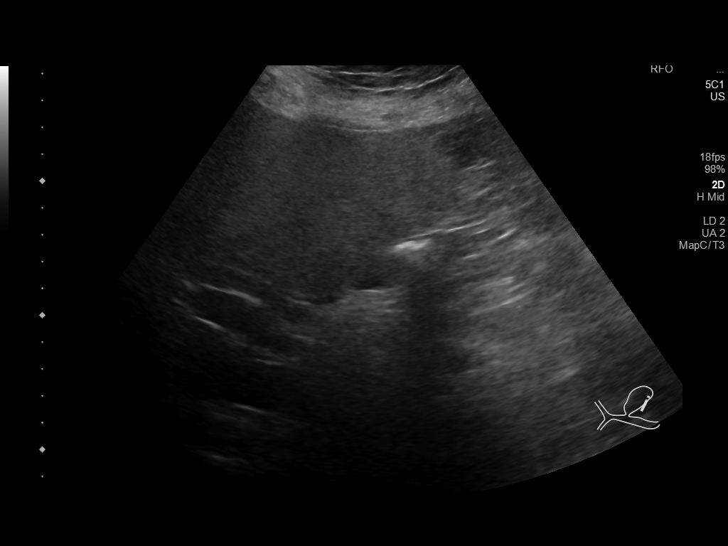
[im 7/74]
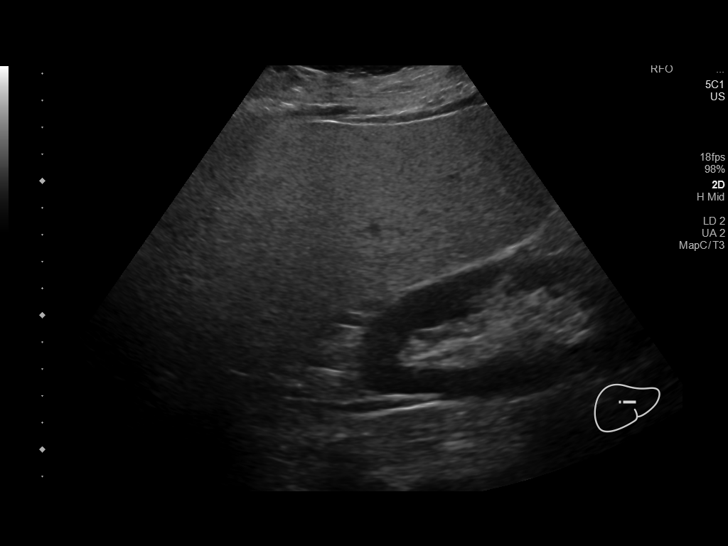
[im 13/74]
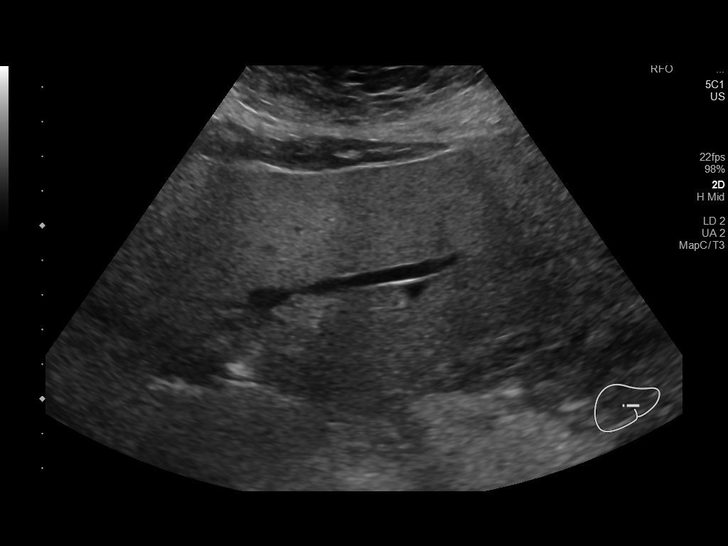
[im 19/74]
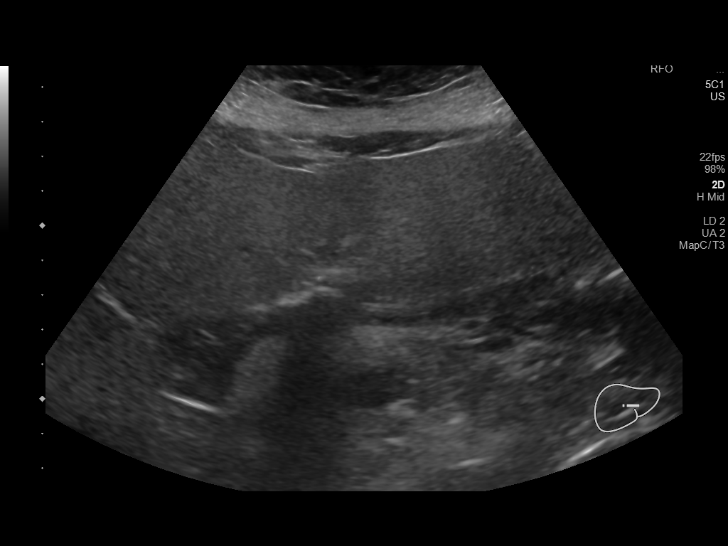
[im 25/74]
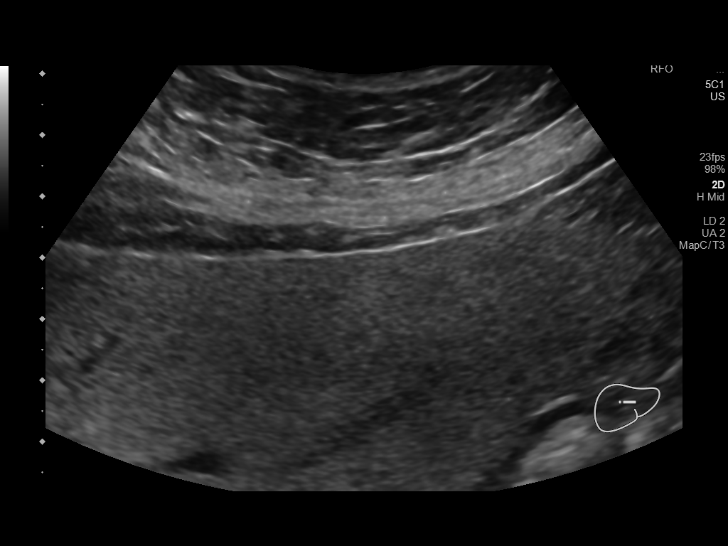
[im 31/74]
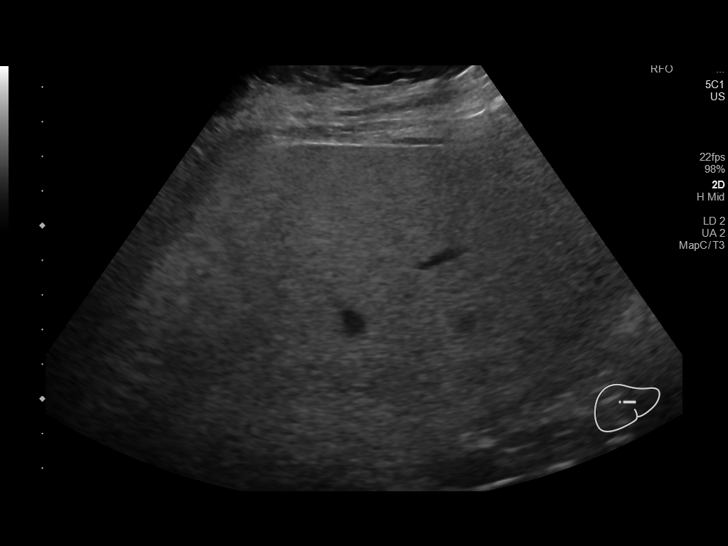
[im 37/74]
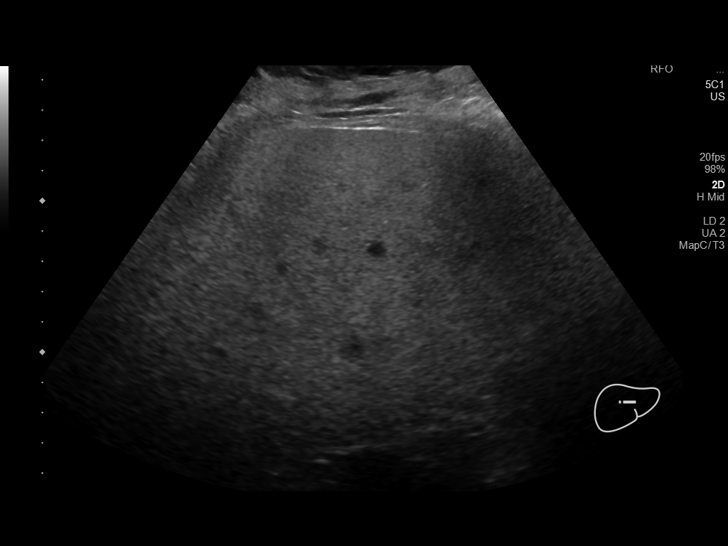
[im 43/74]
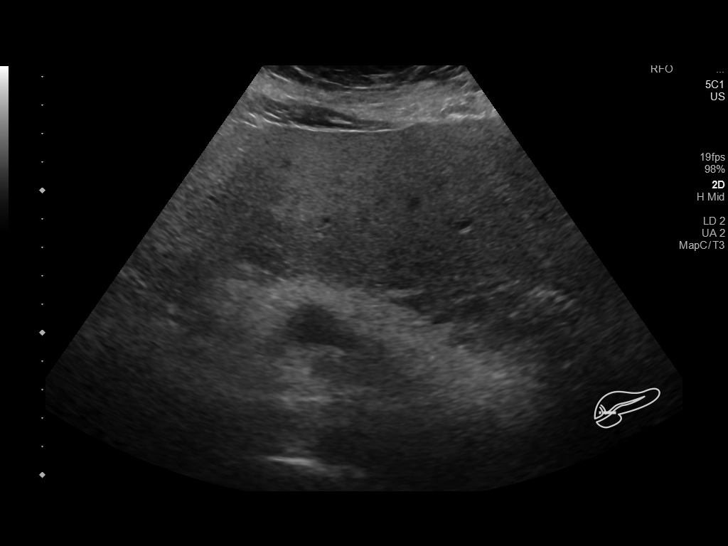
[im 49/74]
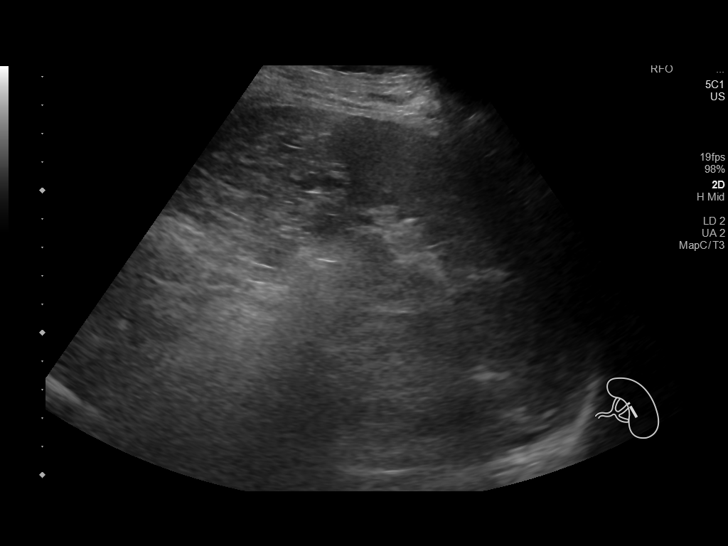
[im 55/74]
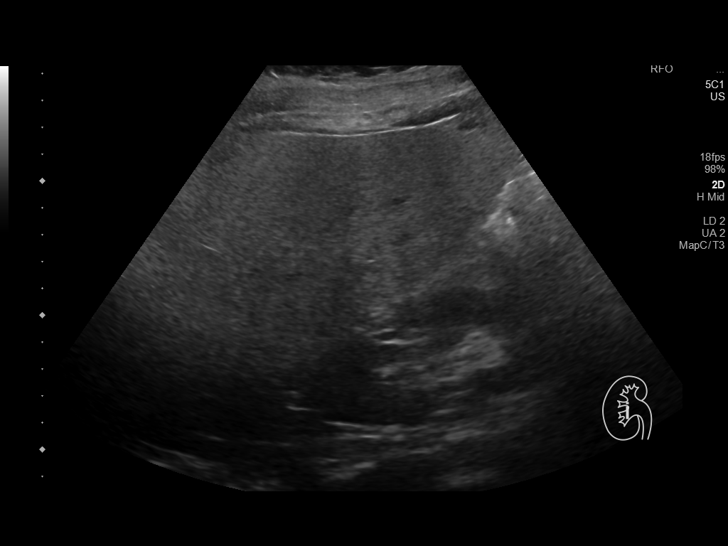
[im 61/74]
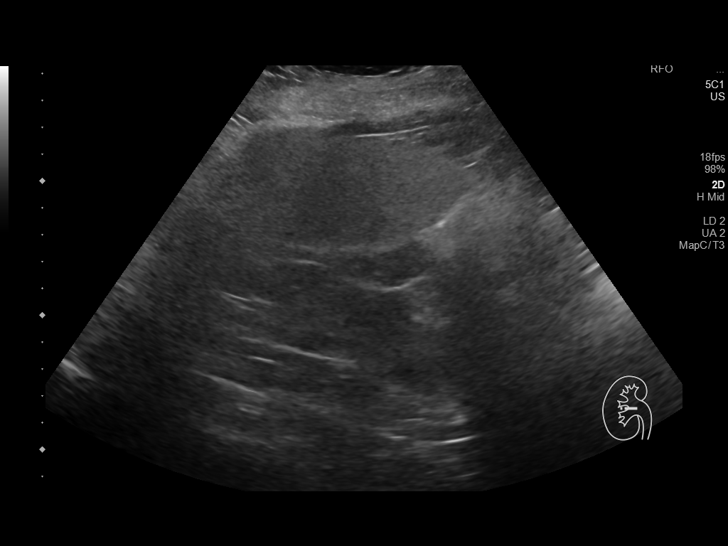
[im 67/74]
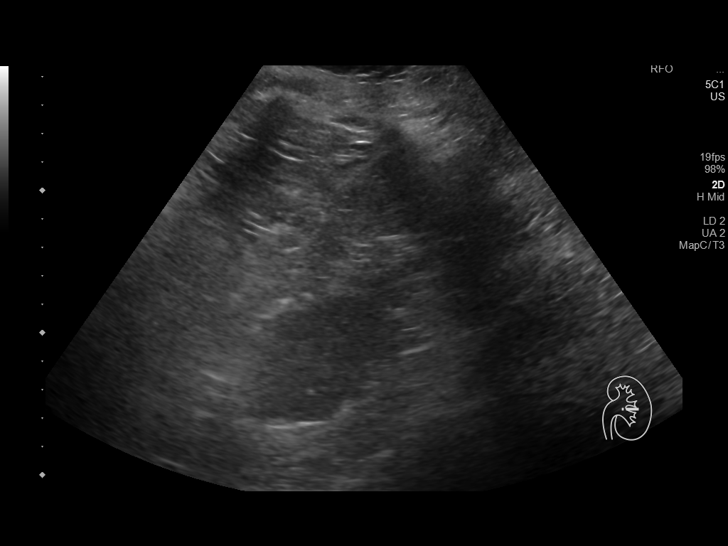
[im 74/74]
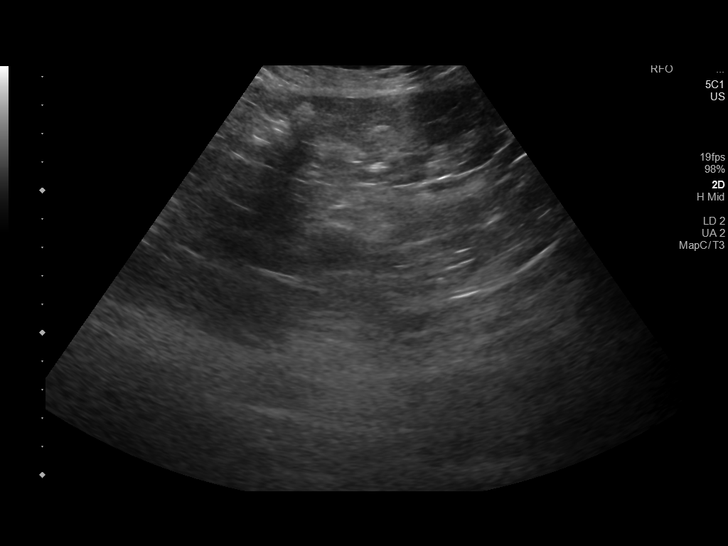

[13 of 25 positions shown; findings below may reference images not displayed]

FINDINGS: Gallbladder: Surgically absent.

Common bile duct: Diameter: 8 mm, which may be within normal limits
for post cholecystectomy state. No intrahepatic biliary duct
dilatation evident.

Liver: No focal lesion identified. Liver echogenicity increased
diffusely. Portal vein is patent on color Doppler imaging with
normal direction of blood flow towards the liver.

IVC: No abnormality visualized.

Pancreas: No pancreatic mass or inflammatory focus.

Spleen: Size and appearance within normal limits.

Right Kidney: Length: 11.4 cm. Echogenicity within normal limits. No
mass or hydronephrosis visualized.

Left Kidney: Length: 12.6 cm. Echogenicity within normal limits. No
mass or hydronephrosis visualized.

Abdominal aorta: No aneurysm visualized.

Other findings: No demonstrable ascites.
IMPRESSION: 1. Gallbladder absent. No biliary duct dilatation given post
cholecystectomy state.

2. Diffuse increase in liver echogenicity, a finding felt to be
indicative of hepatic steatosis. While no focal liver lesions are
evident on this study, it must be cautioned that the sensitivity of
ultrasound for detection of focal liver lesions is diminished in
this circumstance.

3.  Study otherwise unremarkable.

## 2021-08-19 ENCOUNTER — Ambulatory Visit: Payer: 59 | Admitting: Gastroenterology

## 2021-08-21 ENCOUNTER — Ambulatory Visit: Payer: 59 | Admitting: Nurse Practitioner

## 2021-08-21 ENCOUNTER — Other Ambulatory Visit (HOSPITAL_COMMUNITY): Payer: Self-pay

## 2021-08-24 ENCOUNTER — Other Ambulatory Visit (HOSPITAL_COMMUNITY): Payer: Self-pay

## 2021-08-30 ENCOUNTER — Other Ambulatory Visit: Payer: Self-pay | Admitting: Nurse Practitioner

## 2021-08-31 ENCOUNTER — Other Ambulatory Visit (HOSPITAL_COMMUNITY): Payer: Self-pay

## 2021-08-31 MED ORDER — ROSUVASTATIN CALCIUM 10 MG PO TABS
10.0000 mg | ORAL_TABLET | Freq: Every day | ORAL | 1 refills | Status: DC
  Filled 2021-08-31: qty 90, 90d supply, fill #0
  Filled 2021-12-04: qty 90, 90d supply, fill #1

## 2021-09-08 ENCOUNTER — Other Ambulatory Visit (HOSPITAL_COMMUNITY): Payer: Self-pay

## 2021-09-08 DIAGNOSIS — L570 Actinic keratosis: Secondary | ICD-10-CM | POA: Diagnosis not present

## 2021-09-08 DIAGNOSIS — Z85828 Personal history of other malignant neoplasm of skin: Secondary | ICD-10-CM | POA: Diagnosis not present

## 2021-09-08 DIAGNOSIS — D1801 Hemangioma of skin and subcutaneous tissue: Secondary | ICD-10-CM | POA: Diagnosis not present

## 2021-09-08 DIAGNOSIS — D2261 Melanocytic nevi of right upper limb, including shoulder: Secondary | ICD-10-CM | POA: Diagnosis not present

## 2021-09-08 DIAGNOSIS — L820 Inflamed seborrheic keratosis: Secondary | ICD-10-CM | POA: Diagnosis not present

## 2021-09-08 DIAGNOSIS — L918 Other hypertrophic disorders of the skin: Secondary | ICD-10-CM | POA: Diagnosis not present

## 2021-09-08 DIAGNOSIS — L814 Other melanin hyperpigmentation: Secondary | ICD-10-CM | POA: Diagnosis not present

## 2021-09-08 DIAGNOSIS — D225 Melanocytic nevi of trunk: Secondary | ICD-10-CM | POA: Diagnosis not present

## 2021-09-08 DIAGNOSIS — D2262 Melanocytic nevi of left upper limb, including shoulder: Secondary | ICD-10-CM | POA: Diagnosis not present

## 2021-09-08 MED ORDER — NUVAIL EX SOLN
CUTANEOUS | 0 refills | Status: DC
  Filled 2021-09-08: qty 15, 30d supply, fill #0

## 2021-09-09 ENCOUNTER — Telehealth: Payer: 59 | Admitting: Physician Assistant

## 2021-09-09 DIAGNOSIS — B9689 Other specified bacterial agents as the cause of diseases classified elsewhere: Secondary | ICD-10-CM

## 2021-09-09 DIAGNOSIS — J019 Acute sinusitis, unspecified: Secondary | ICD-10-CM | POA: Diagnosis not present

## 2021-09-09 MED ORDER — AMOXICILLIN-POT CLAVULANATE 875-125 MG PO TABS
1.0000 | ORAL_TABLET | Freq: Two times a day (BID) | ORAL | 0 refills | Status: AC
  Filled 2021-09-09: qty 14, 7d supply, fill #0

## 2021-09-09 NOTE — Progress Notes (Signed)

## 2021-09-09 NOTE — Progress Notes (Signed)
I have spent 5 minutes in review of e-visit questionnaire, review and updating patient chart, medical decision making and response to patient.   Audrey Bidwell Cody Nyree Applegate, PA-C    

## 2021-09-10 ENCOUNTER — Other Ambulatory Visit (HOSPITAL_COMMUNITY): Payer: Self-pay

## 2021-09-16 ENCOUNTER — Other Ambulatory Visit (HOSPITAL_COMMUNITY): Payer: Self-pay

## 2021-09-16 ENCOUNTER — Other Ambulatory Visit: Payer: Self-pay | Admitting: Nurse Practitioner

## 2021-09-17 ENCOUNTER — Other Ambulatory Visit (HOSPITAL_COMMUNITY): Payer: Self-pay

## 2021-09-18 ENCOUNTER — Other Ambulatory Visit (HOSPITAL_COMMUNITY): Payer: Self-pay

## 2021-09-18 MED ORDER — FREESTYLE LITE TEST VI STRP
ORAL_STRIP | 0 refills | Status: DC
  Filled 2021-09-18: qty 100, 50d supply, fill #0

## 2021-09-18 MED ORDER — AMITRIPTYLINE HCL 50 MG PO TABS
ORAL_TABLET | Freq: Every day | ORAL | 0 refills | Status: DC
  Filled 2021-09-18: qty 90, 90d supply, fill #0

## 2021-09-18 MED ORDER — ALLOPURINOL 300 MG PO TABS
300.0000 mg | ORAL_TABLET | Freq: Two times a day (BID) | ORAL | 0 refills | Status: DC
  Filled 2021-09-18: qty 180, 90d supply, fill #0

## 2021-09-21 ENCOUNTER — Other Ambulatory Visit (HOSPITAL_COMMUNITY): Payer: Self-pay

## 2021-10-12 ENCOUNTER — Other Ambulatory Visit (HOSPITAL_COMMUNITY): Payer: Self-pay

## 2021-10-13 ENCOUNTER — Other Ambulatory Visit (HOSPITAL_COMMUNITY): Payer: Self-pay

## 2021-10-14 ENCOUNTER — Other Ambulatory Visit (HOSPITAL_COMMUNITY): Payer: Self-pay

## 2021-10-14 IMAGING — CT CT ABDOMEN WO/W CM
3 of 10 series · 12 of 46 positions shown, 18 images · IV contrast (omnipaque)
Comparison: 07/26/2005

CLINICAL DATA: Abdominal pain and distention for 3 months.
Unintentional weight gain. Elevated liver function tests.

EXAM:
CT ABDOMEN WITHOUT AND WITH CONTRAST
TECHNIQUE: Multidetector CT imaging of the abdomen was performed following the
standard protocol before and following the bolus administration of
intravenous contrast.
CONTRAST:  100mL OMNIPAQUE IOHEXOL 300 MG/ML  SOLN

[Series 3: axial arterial · axial · arterial · 0.75mm/px · z∈[+1085,+1238]mm · 4 of 103 slices shown]
[im 18/103  soft-tissue]
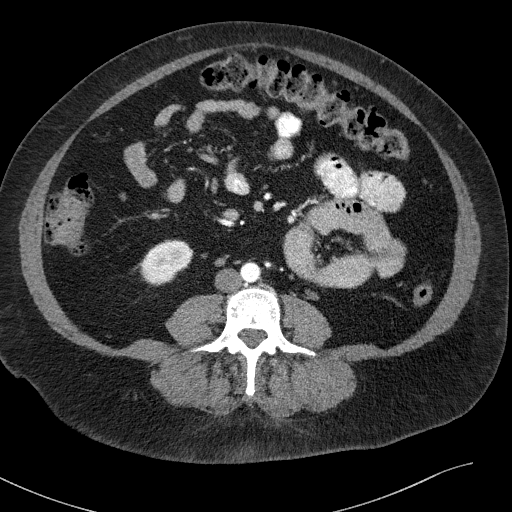
[im 35/103  soft-tissue]
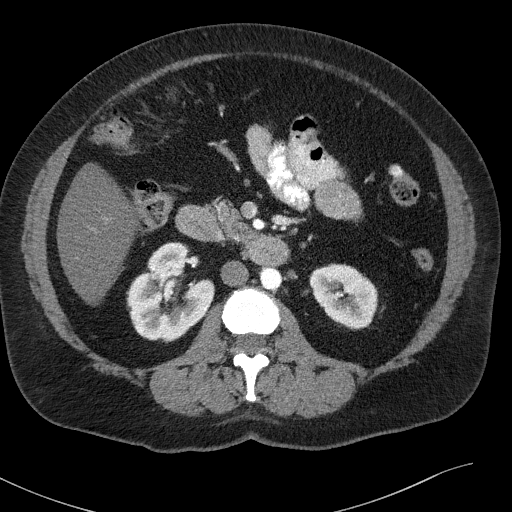
[im 52/103  soft-tissue]
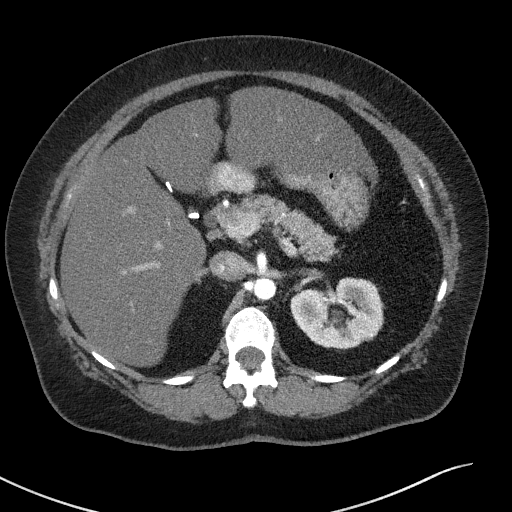
[im 69/103  soft-tissue]
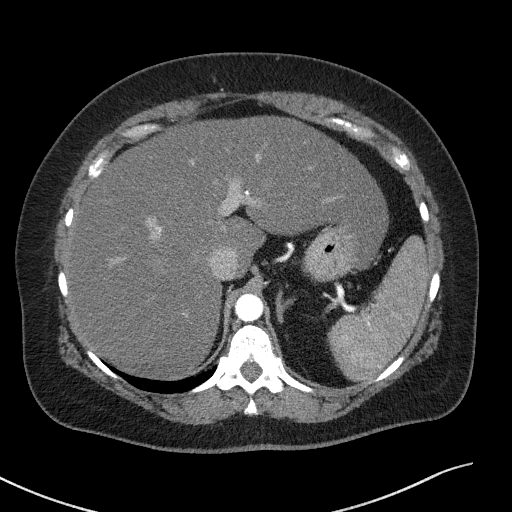

[Series 4: axial venous · axial · portal-venous · 0.75mm/px · z∈[+1085,+1289]mm · 5 of 103 slices shown, 10 images]
[im 18/103  soft-tissue]
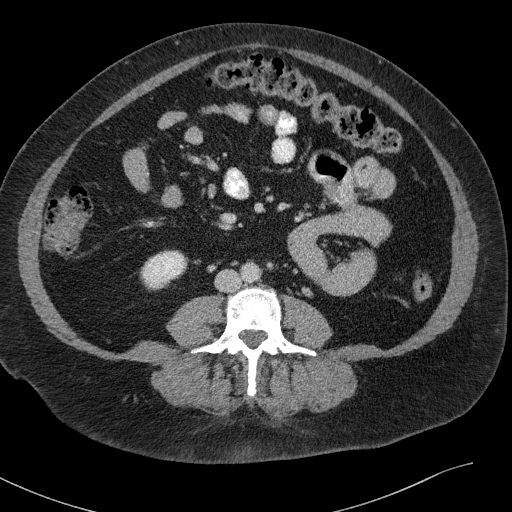
[im 18/103  bone]
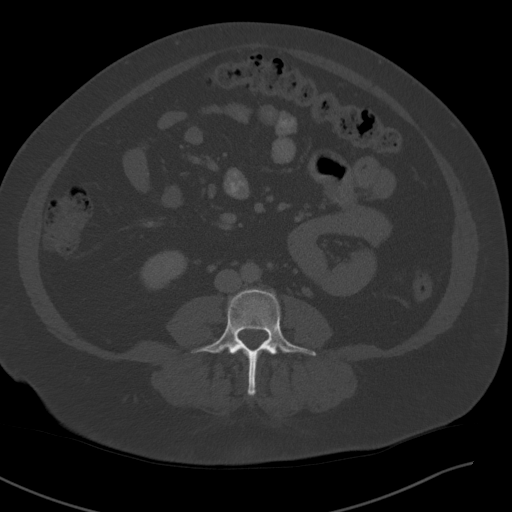
[im 35/103  soft-tissue]
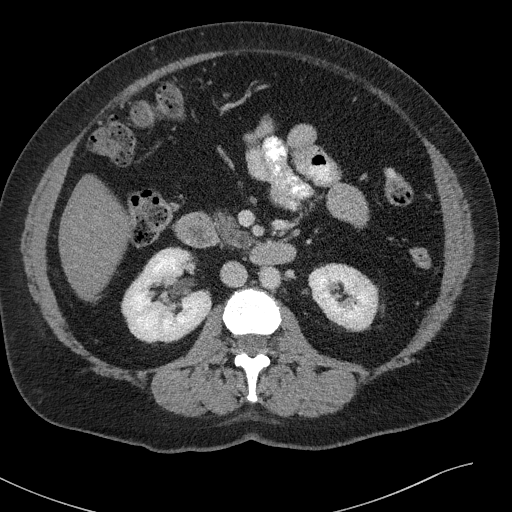
[im 35/103  lung]
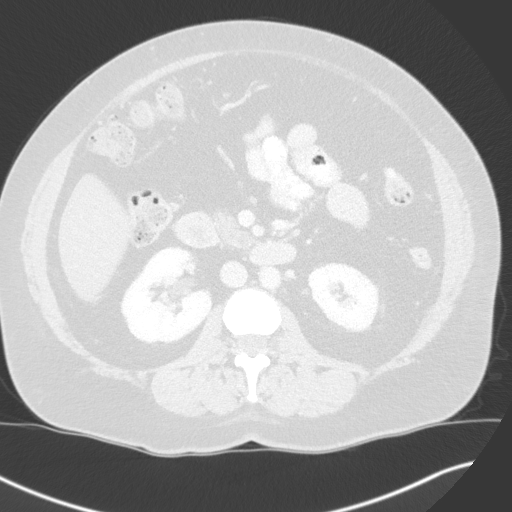
[im 52/103  soft-tissue]
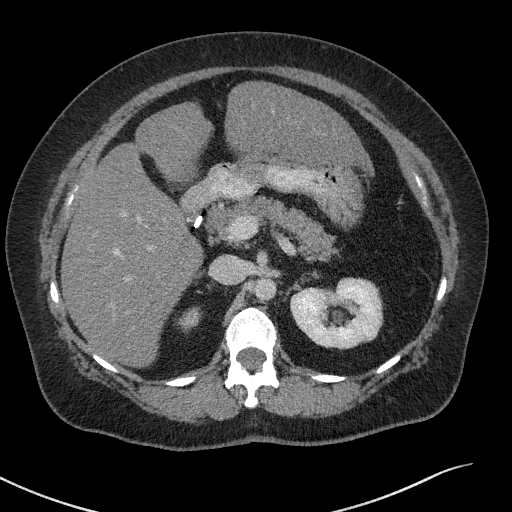
[im 52/103  lung]
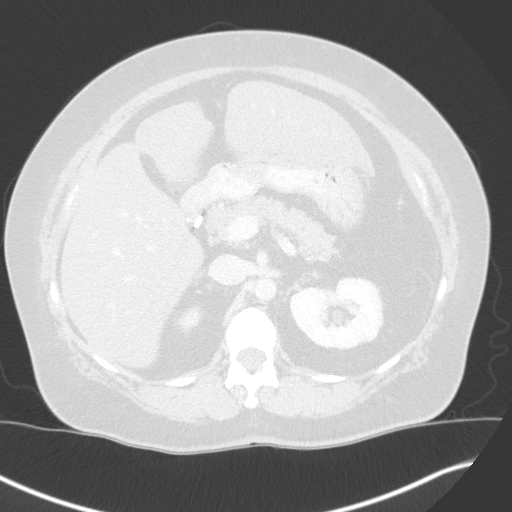
[im 69/103  soft-tissue]
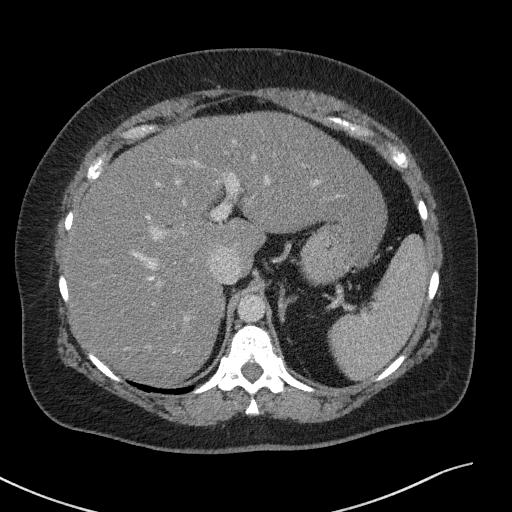
[im 69/103  lung]
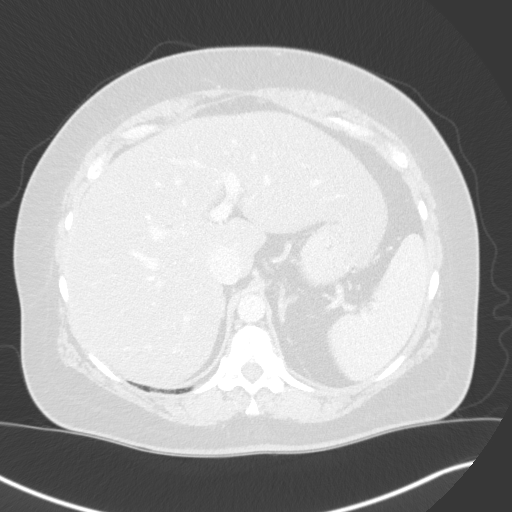
[im 86/103  soft-tissue]
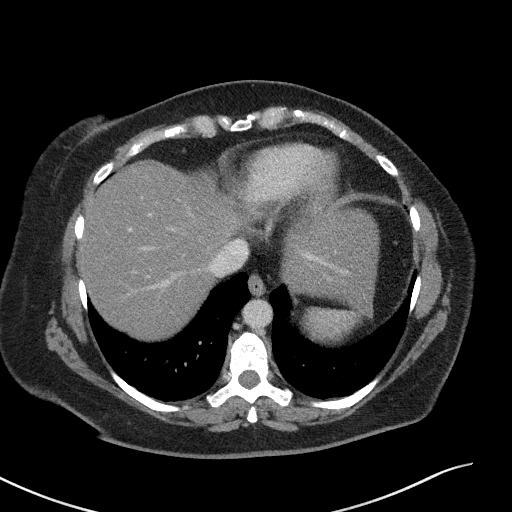
[im 86/103  lung]
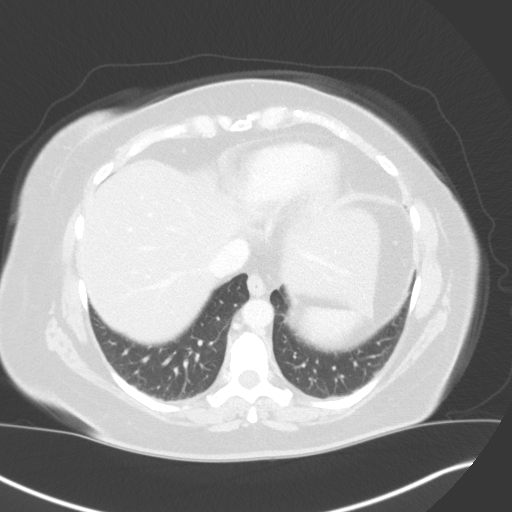

[Series 9: coronal arterial · coronal · arterial · 0.61mm/px · 3 of 104 slices shown, 4 images]
[im 26/104  soft-tissue]
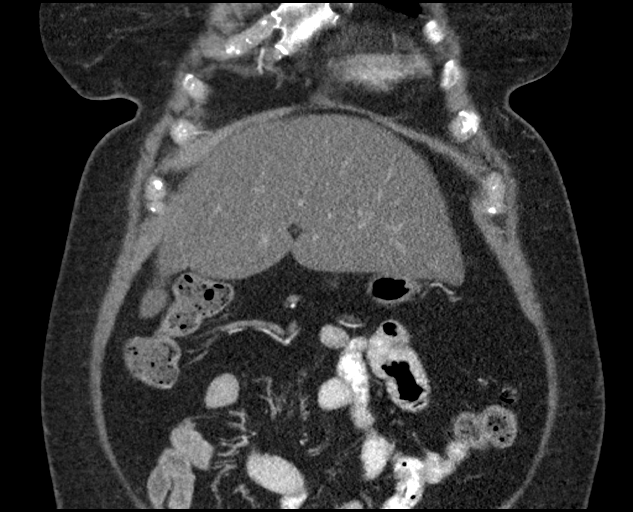
[im 52/104  soft-tissue]
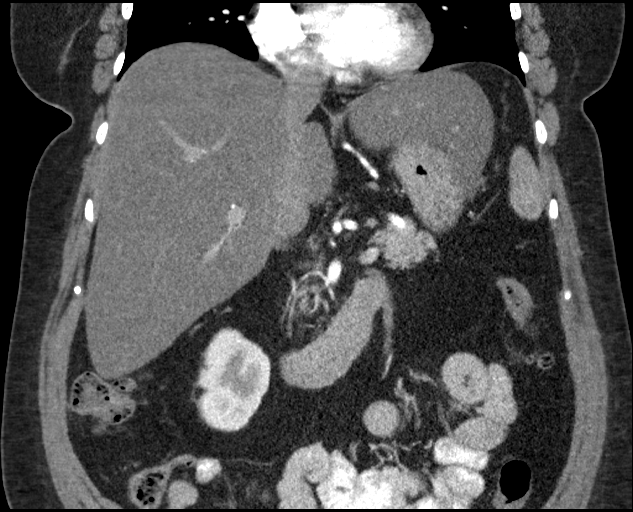
[im 52/104  bone]
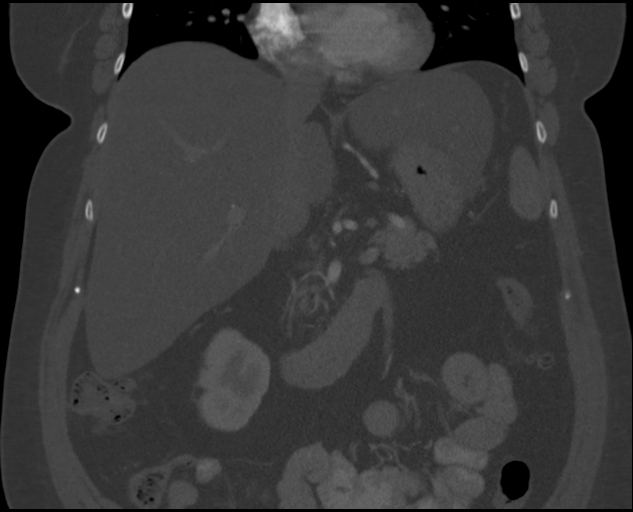
[im 78/104  soft-tissue]
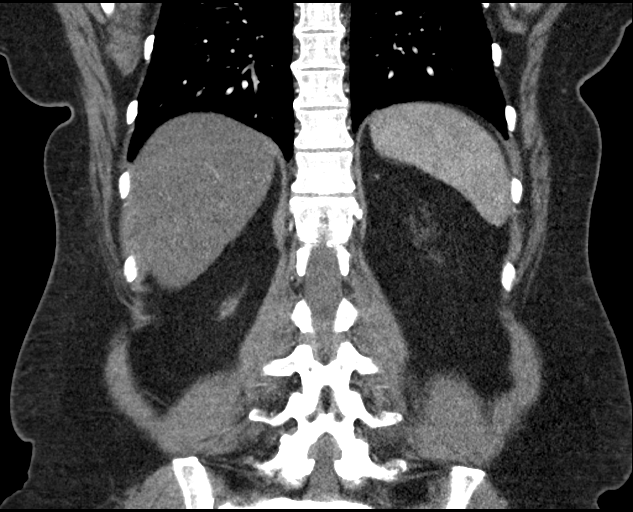

[12 of 46 positions shown; findings below may reference images not displayed]

FINDINGS: Lower Chest: No acute findings.

Hepatobiliary: Increased moderate diffuse hepatic steatosis since
prior study. No hepatic masses identified. Prior cholecystectomy. No
evidence of biliary obstruction.

Pancreas:  No mass or inflammatory changes.

Spleen: Within normal limits in size and appearance.

Adrenals/Urinary Tract: No masses identified. No evidence of
hydronephrosis.

Stomach/Bowel: No evidence of obstruction, inflammatory process or
abnormal fluid collections.

Vascular/Lymphatic: No pathologically enlarged lymph nodes. No
abdominal aortic aneurysm.

Other:  None.  No evidence of ascites.

Musculoskeletal:  No suspicious bone lesions identified.
IMPRESSION: Increased moderate hepatic steatosis since prior study in 5116. No
other acute findings or significant abnormality.

## 2021-10-15 ENCOUNTER — Ambulatory Visit: Payer: 59 | Admitting: Family Medicine

## 2021-10-15 ENCOUNTER — Encounter: Payer: Self-pay | Admitting: Family Medicine

## 2021-10-15 ENCOUNTER — Telehealth: Payer: 59 | Admitting: Physician Assistant

## 2021-10-15 ENCOUNTER — Other Ambulatory Visit (HOSPITAL_COMMUNITY): Payer: Self-pay

## 2021-10-15 ENCOUNTER — Other Ambulatory Visit: Payer: Self-pay

## 2021-10-15 VITALS — BP 110/73 | HR 118 | Temp 97.9°F | Ht 65.0 in | Wt 186.0 lb

## 2021-10-15 DIAGNOSIS — K146 Glossodynia: Secondary | ICD-10-CM

## 2021-10-15 DIAGNOSIS — E781 Pure hyperglyceridemia: Secondary | ICD-10-CM

## 2021-10-15 DIAGNOSIS — E1142 Type 2 diabetes mellitus with diabetic polyneuropathy: Secondary | ICD-10-CM

## 2021-10-15 DIAGNOSIS — E538 Deficiency of other specified B group vitamins: Secondary | ICD-10-CM

## 2021-10-15 DIAGNOSIS — K145 Plicated tongue: Secondary | ICD-10-CM | POA: Diagnosis not present

## 2021-10-15 DIAGNOSIS — R748 Abnormal levels of other serum enzymes: Secondary | ICD-10-CM

## 2021-10-15 DIAGNOSIS — K141 Geographic tongue: Secondary | ICD-10-CM

## 2021-10-15 DIAGNOSIS — E782 Mixed hyperlipidemia: Secondary | ICD-10-CM

## 2021-10-15 MED ORDER — NYSTATIN 100000 UNIT/ML MT SUSP
5.0000 mL | Freq: Three times a day (TID) | OROMUCOSAL | 0 refills | Status: DC | PRN
  Filled 2021-10-15: qty 540, 14d supply, fill #0

## 2021-10-15 NOTE — Progress Notes (Signed)
I have spent 5 minutes in review of e-visit questionnaire, review and updating patient chart, medical decision making and response to patient.   Randalyn Ahmed Cody Mirah Nevins, PA-C    

## 2021-10-15 NOTE — Progress Notes (Signed)
E-Visit for Mouth Ulcers  We are sorry that you are not feeling well.  Here is how we plan to help!  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     The following medications should decrease the discomfort and help with healing. Biotene mouthwash 3 times daily (Available over the counter) and Multivitamin daily  I have prescribed a magic mouthwash solution to use as well to reduce inflammation and ease pain to help resolve this. I do want you to schedule a follow-up with your PCP.    GET HELP RIGHT AWAY IF: Persistent ulcers require checking IN PERSON (face to face). Any mouth lesion lasting longer than a month should be seen by your DENTIST as soon as possible for evaluation for possible oral cancer. If you have a non-painful ulcer in 1 or more areas of your mouth Ulcers that are spreading, are very large or particularly painful Ulcers last longer than one week without improving on treatment If you develop a fever, swollen glands and begin to feel unwell Ulcers that developed after starting a new medication MAKE SURE YOU: Understand these instructions. Will watch your condition. Will get help right away if you are not doing well or get worse.  Thank you for choosing an e-visit.  Your e-visit answers were reviewed by a board certified advanced clinical practitioner to complete your personal care plan. Depending upon the condition, your plan could have included both over the counter or prescription medications.  Please review your pharmacy choice. Make sure the pharmacy is open so you can pick up prescription now. If there is a problem, you may contact your provider through [employer] and have the prescription routed to another pharmacy.  Your safety is important to [place]. If you have drug allergies check your prescription carefully.   For the next 24 hours you can use MyChart to ask questions about today's visit,  request a non-urgent call back, or ask for a work or school excuse. You will get an email in the next two days asking about your experience. I hope that your e-visit has been valuable and will speed your recovery.

## 2021-10-15 NOTE — Progress Notes (Signed)
   Subjective:    Patient ID: [name], female    DOB: [birth year], 51 y.o.   MRN: [id number]  HPI She relates that she has cracks in her tongue that are causing pain and discomfort she did a E-visit they recommended for her to be seen in person she is trying to do the best she can with this.  She does have underlying diabetes.  Denies any fevers.  No new medicines.  Does have a history of B12 deficiency.   Review of Systems     Objective:   Physical Exam  Neck no masses.  Lungs clear heart regular.  Fissures in the tongue noted.      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

## 2021-10-18 ENCOUNTER — Other Ambulatory Visit: Payer: Self-pay | Admitting: Nurse Practitioner

## 2021-10-18 ENCOUNTER — Encounter: Payer: Self-pay | Admitting: Family Medicine

## 2021-10-18 DIAGNOSIS — E538 Deficiency of other specified B group vitamins: Secondary | ICD-10-CM

## 2021-10-18 DIAGNOSIS — E559 Vitamin D deficiency, unspecified: Secondary | ICD-10-CM

## 2021-10-18 DIAGNOSIS — E611 Iron deficiency: Secondary | ICD-10-CM

## 2021-10-18 DIAGNOSIS — E60 Dietary zinc deficiency: Secondary | ICD-10-CM

## 2021-10-19 ENCOUNTER — Other Ambulatory Visit (HOSPITAL_COMMUNITY): Payer: Self-pay

## 2021-10-19 DIAGNOSIS — E60 Dietary zinc deficiency: Secondary | ICD-10-CM | POA: Diagnosis not present

## 2021-10-19 DIAGNOSIS — E611 Iron deficiency: Secondary | ICD-10-CM | POA: Diagnosis not present

## 2021-10-19 DIAGNOSIS — E559 Vitamin D deficiency, unspecified: Secondary | ICD-10-CM | POA: Diagnosis not present

## 2021-10-19 DIAGNOSIS — E538 Deficiency of other specified B group vitamins: Secondary | ICD-10-CM | POA: Diagnosis not present

## 2021-10-19 MED ORDER — ALLOPURINOL 300 MG PO TABS
300.0000 mg | ORAL_TABLET | Freq: Two times a day (BID) | ORAL | 0 refills | Status: DC
  Filled 2021-10-19 – 2021-12-07 (×3): qty 180, 90d supply, fill #0

## 2021-10-19 NOTE — Telephone Encounter (Signed)
Nurses may order vitamin D, ferritin, zinc, B12-May go ahead with getting vitamin levels Does not have to be fasting for this Diagnosis vitamin D deficiency, iron deficiency, zinc deficiency, B12 deficiency   When the patient does her blood work in September she will need to have those orders redone to remove the B12

## 2021-10-23 ENCOUNTER — Other Ambulatory Visit: Payer: Self-pay | Admitting: Nurse Practitioner

## 2021-10-26 LAB — FERRITIN: Ferritin: 35 ng/mL (ref 15–150)

## 2021-10-26 LAB — VITAMIN D 25 HYDROXY (VIT D DEFICIENCY, FRACTURES): Vit D, 25-Hydroxy: 21.3 ng/mL — ABNORMAL LOW (ref 30.0–100.0)

## 2021-10-26 LAB — VITAMIN B12: Vitamin B-12: 575 pg/mL (ref 232–1245)

## 2021-10-26 LAB — ZINC: Zinc: 81 ug/dL (ref 44–115)

## 2021-10-27 ENCOUNTER — Other Ambulatory Visit (HOSPITAL_COMMUNITY): Payer: Self-pay

## 2021-10-27 MED ORDER — VITAMIN D (ERGOCALCIFEROL) 1.25 MG (50000 UNIT) PO CAPS
50000.0000 [IU] | ORAL_CAPSULE | ORAL | 0 refills | Status: DC
Start: 2021-10-27 — End: 2022-01-06
  Filled 2021-10-27: qty 8, 56d supply, fill #0

## 2021-10-28 ENCOUNTER — Other Ambulatory Visit: Payer: Self-pay | Admitting: Family Medicine

## 2021-10-28 ENCOUNTER — Encounter: Payer: Self-pay | Admitting: Family Medicine

## 2021-10-28 DIAGNOSIS — K141 Geographic tongue: Secondary | ICD-10-CM

## 2021-10-28 DIAGNOSIS — K146 Glossodynia: Secondary | ICD-10-CM

## 2021-10-28 NOTE — Telephone Encounter (Signed)
Nurses Please go ahead and send them Magic mouthwash 5 mL swish and spit 3 times daily as needed 12 ounces 2 refills

## 2021-10-29 ENCOUNTER — Other Ambulatory Visit (HOSPITAL_COMMUNITY): Payer: Self-pay

## 2021-10-29 ENCOUNTER — Other Ambulatory Visit: Payer: Self-pay | Admitting: Family Medicine

## 2021-10-29 MED ORDER — NYSTATIN 100000 UNIT/ML MT SUSP
OROMUCOSAL | 2 refills | Status: DC
  Filled 2021-10-29: qty 354, 24d supply, fill #0
  Filled 2021-10-29: qty 354, 23d supply, fill #0

## 2021-10-29 MED ORDER — NYSTATIN 100000 UNIT/ML MT SUSP
OROMUCOSAL | 2 refills | Status: DC
  Filled 2021-10-29: qty 450, 30d supply, fill #0
  Filled 2022-02-09: qty 450, 30d supply, fill #1
  Filled 2022-02-11: qty 450, 30d supply, fill #0

## 2021-10-30 ENCOUNTER — Other Ambulatory Visit (HOSPITAL_COMMUNITY): Payer: Self-pay

## 2021-10-30 ENCOUNTER — Other Ambulatory Visit: Payer: Self-pay | Admitting: Nurse Practitioner

## 2021-10-30 DIAGNOSIS — K141 Geographic tongue: Secondary | ICD-10-CM

## 2021-11-02 ENCOUNTER — Other Ambulatory Visit (HOSPITAL_COMMUNITY): Payer: Self-pay

## 2021-11-03 ENCOUNTER — Other Ambulatory Visit (HOSPITAL_COMMUNITY): Payer: Self-pay

## 2021-11-06 ENCOUNTER — Ambulatory Visit: Payer: 59 | Admitting: Nurse Practitioner

## 2021-11-12 ENCOUNTER — Other Ambulatory Visit: Payer: Self-pay | Admitting: Nurse Practitioner

## 2021-11-13 ENCOUNTER — Other Ambulatory Visit (HOSPITAL_COMMUNITY): Payer: Self-pay

## 2021-11-13 MED ORDER — FREESTYLE LITE TEST VI STRP
ORAL_STRIP | 1 refills | Status: DC
  Filled 2021-11-13: qty 100, 50d supply, fill #0
  Filled 2022-03-22: qty 100, 50d supply, fill #1

## 2021-11-16 ENCOUNTER — Inpatient Hospital Stay (HOSPITAL_COMMUNITY)
Admission: EM | Admit: 2021-11-16 | Discharge: 2021-11-17 | DRG: 062 | Disposition: A | Discharge status: Home / Self Care | Payer: 59 | Attending: Neurology | Admitting: Neurology

## 2021-11-16 ENCOUNTER — Other Ambulatory Visit: Payer: Self-pay

## 2021-11-16 ENCOUNTER — Inpatient Hospital Stay (HOSPITAL_COMMUNITY): Payer: 59

## 2021-11-16 ENCOUNTER — Emergency Department (HOSPITAL_COMMUNITY): Payer: 59

## 2021-11-16 ENCOUNTER — Other Ambulatory Visit (HOSPITAL_COMMUNITY): Payer: Self-pay | Admitting: *Deleted

## 2021-11-16 ENCOUNTER — Encounter (HOSPITAL_COMMUNITY): Payer: Self-pay | Admitting: Emergency Medicine

## 2021-11-16 ENCOUNTER — Encounter (HOSPITAL_COMMUNITY): Payer: Self-pay

## 2021-11-16 DIAGNOSIS — R29810 Facial weakness: Secondary | ICD-10-CM | POA: Diagnosis present

## 2021-11-16 DIAGNOSIS — R488 Other symbolic dysfunctions: Secondary | ICD-10-CM | POA: Diagnosis present

## 2021-11-16 DIAGNOSIS — Z8249 Family history of ischemic heart disease and other diseases of the circulatory system: Secondary | ICD-10-CM

## 2021-11-16 DIAGNOSIS — I639 Cerebral infarction, unspecified: Secondary | ICD-10-CM | POA: Diagnosis not present

## 2021-11-16 DIAGNOSIS — R29705 NIHSS score 5: Secondary | ICD-10-CM | POA: Diagnosis present

## 2021-11-16 DIAGNOSIS — E1169 Type 2 diabetes mellitus with other specified complication: Secondary | ICD-10-CM | POA: Diagnosis not present

## 2021-11-16 DIAGNOSIS — R531 Weakness: Secondary | ICD-10-CM

## 2021-11-16 DIAGNOSIS — I6389 Other cerebral infarction: Secondary | ICD-10-CM | POA: Diagnosis not present

## 2021-11-16 DIAGNOSIS — I10 Essential (primary) hypertension: Secondary | ICD-10-CM | POA: Diagnosis not present

## 2021-11-16 DIAGNOSIS — M109 Gout, unspecified: Secondary | ICD-10-CM | POA: Diagnosis not present

## 2021-11-16 DIAGNOSIS — Z20822 Contact with and (suspected) exposure to covid-19: Secondary | ICD-10-CM | POA: Diagnosis present

## 2021-11-16 DIAGNOSIS — Z7984 Long term (current) use of oral hypoglycemic drugs: Secondary | ICD-10-CM

## 2021-11-16 DIAGNOSIS — G8191 Hemiplegia, unspecified affecting right dominant side: Secondary | ICD-10-CM | POA: Diagnosis present

## 2021-11-16 DIAGNOSIS — Z8371 Family history of colonic polyps: Secondary | ICD-10-CM | POA: Diagnosis not present

## 2021-11-16 DIAGNOSIS — F419 Anxiety disorder, unspecified: Secondary | ICD-10-CM | POA: Diagnosis present

## 2021-11-16 DIAGNOSIS — G459 Transient cerebral ischemic attack, unspecified: Secondary | ICD-10-CM | POA: Diagnosis not present

## 2021-11-16 DIAGNOSIS — R471 Dysarthria and anarthria: Secondary | ICD-10-CM | POA: Diagnosis not present

## 2021-11-16 DIAGNOSIS — Z888 Allergy status to other drugs, medicaments and biological substances status: Secondary | ICD-10-CM

## 2021-11-16 DIAGNOSIS — Z79899 Other long term (current) drug therapy: Secondary | ICD-10-CM

## 2021-11-16 DIAGNOSIS — E7800 Pure hypercholesterolemia, unspecified: Secondary | ICD-10-CM | POA: Diagnosis present

## 2021-11-16 DIAGNOSIS — E119 Type 2 diabetes mellitus without complications: Secondary | ICD-10-CM | POA: Diagnosis present

## 2021-11-16 DIAGNOSIS — R29818 Other symptoms and signs involving the nervous system: Secondary | ICD-10-CM | POA: Diagnosis not present

## 2021-11-16 DIAGNOSIS — G43909 Migraine, unspecified, not intractable, without status migrainosus: Secondary | ICD-10-CM | POA: Diagnosis present

## 2021-11-16 DIAGNOSIS — R29700 NIHSS score 0: Secondary | ICD-10-CM | POA: Diagnosis not present

## 2021-11-16 DIAGNOSIS — E785 Hyperlipidemia, unspecified: Secondary | ICD-10-CM

## 2021-11-16 DIAGNOSIS — Z833 Family history of diabetes mellitus: Secondary | ICD-10-CM | POA: Diagnosis not present

## 2021-11-16 DIAGNOSIS — R4781 Slurred speech: Principal | ICD-10-CM

## 2021-11-16 DIAGNOSIS — I672 Cerebral atherosclerosis: Secondary | ICD-10-CM | POA: Diagnosis not present

## 2021-11-16 DIAGNOSIS — R000 Tachycardia, unspecified: Secondary | ICD-10-CM | POA: Diagnosis not present

## 2021-11-16 HISTORY — DX: Personal history of other diseases of the nervous system and sense organs: Z86.69

## 2021-11-16 LAB — COMPREHENSIVE METABOLIC PANEL
ALT: 34 U/L (ref 0–44)
AST: 61 U/L — ABNORMAL HIGH (ref 15–41)
Albumin: 4.2 g/dL (ref 3.5–5.0)
Alkaline Phosphatase: 69 U/L (ref 38–126)
Anion gap: 11 (ref 5–15)
BUN: 10 mg/dL (ref 6–20)
CO2: 28 mmol/L (ref 22–32)
Calcium: 9.2 mg/dL (ref 8.9–10.3)
Chloride: 98 mmol/L (ref 98–111)
Creatinine, Ser: 0.72 mg/dL (ref 0.44–1.00)
GFR, Estimated: >60 mL/min (ref >60)
Glucose, Bld: 113 mg/dL — ABNORMAL HIGH (ref 70–99)
Potassium: 3.7 mmol/L (ref 3.5–5.1)
Sodium: 137 mmol/L (ref 135–145)
Total Bilirubin: 0.6 mg/dL (ref 0.3–1.2)
Total Protein: 7.3 g/dL (ref 6.5–8.1)

## 2021-11-16 LAB — DIFFERENTIAL
Abs Immature Granulocytes: 0.01 10*3/uL (ref 0.00–0.07)
Basophils Absolute: 0.1 10*3/uL (ref 0.0–0.1)
Basophils Relative: 1 %
Eosinophils Absolute: 0.2 10*3/uL (ref 0.0–0.5)
Eosinophils Relative: 2 %
Immature Granulocytes: 0 %
Lymphocytes Relative: 44 %
Lymphs Abs: 3.6 10*3/uL (ref 0.7–4.0)
Monocytes Absolute: 0.6 10*3/uL (ref 0.1–1.0)
Monocytes Relative: 7 %
Neutro Abs: 3.9 10*3/uL (ref 1.7–7.7)
Neutrophils Relative %: 46 %

## 2021-11-16 LAB — ECHOCARDIOGRAM COMPLETE
AR max vel: 2.19 cm2
AV Area VTI: 2 cm2
AV Area mean vel: 2.05 cm2
AV Mean grad: 5 mmHg
AV Peak grad: 9.5 mmHg
Ao pk vel: 1.54 m/s
Area-P 1/2: 5.54 cm2
Height: 65 in
MV VTI: 3.11 cm2
S' Lateral: 2 cm
Weight: 2895.96 oz

## 2021-11-16 LAB — PREGNANCY, URINE: Preg Test, Ur: NEGATIVE

## 2021-11-16 LAB — RESP PANEL BY RT-PCR (FLU A&B, COVID) ARPGX2
Influenza A by PCR: NEGATIVE
Influenza B by PCR: NEGATIVE
SARS Coronavirus 2 by RT PCR: NEGATIVE

## 2021-11-16 LAB — URINALYSIS, ROUTINE W REFLEX MICROSCOPIC
Bilirubin Urine: NEGATIVE
Glucose, UA: NEGATIVE mg/dL
Hgb urine dipstick: NEGATIVE
Ketones, ur: NEGATIVE mg/dL
Leukocytes,Ua: NEGATIVE
Nitrite: NEGATIVE
Protein, ur: NEGATIVE mg/dL
Specific Gravity, Urine: 1.014 (ref 1.005–1.030)
pH: 7 (ref 5.0–8.0)

## 2021-11-16 LAB — CBC
HCT: 39.3 % (ref 36.0–46.0)
Hemoglobin: 12.4 g/dL (ref 12.0–15.0)
MCH: 27.3 pg (ref 26.0–34.0)
MCHC: 31.6 g/dL (ref 30.0–36.0)
MCV: 86.4 fL (ref 80.0–100.0)
Platelets: 347 10*3/uL (ref 150–400)
RBC: 4.55 MIL/uL (ref 3.87–5.11)
RDW: 14.6 % (ref 11.5–15.5)
WBC: 8.2 10*3/uL (ref 4.0–10.5)
nRBC: 0 % (ref 0.0–0.2)

## 2021-11-16 LAB — I-STAT CHEM 8, ED
BUN: 8 mg/dL (ref 6–20)
Calcium, Ion: 1.04 mmol/L — ABNORMAL LOW (ref 1.15–1.40)
Chloride: 95 mmol/L — ABNORMAL LOW (ref 98–111)
Creatinine, Ser: 0.7 mg/dL (ref 0.44–1.00)
Glucose, Bld: 112 mg/dL — ABNORMAL HIGH (ref 70–99)
HCT: 41 % (ref 36.0–46.0)
Hemoglobin: 13.9 g/dL (ref 12.0–15.0)
Potassium: 3.8 mmol/L (ref 3.5–5.1)
Sodium: 137 mmol/L (ref 135–145)
TCO2: 29 mmol/L (ref 22–32)

## 2021-11-16 LAB — PROTIME-INR
INR: 1 (ref 0.8–1.2)
Prothrombin Time: 13.3 seconds (ref 11.4–15.2)

## 2021-11-16 LAB — HEMOGLOBIN A1C
Hgb A1c MFr Bld: 6.8 % — ABNORMAL HIGH (ref 4.8–5.6)
Mean Plasma Glucose: 148.46 mg/dL

## 2021-11-16 LAB — CBG MONITORING, ED: Glucose-Capillary: 123 mg/dL — ABNORMAL HIGH (ref 70–99)

## 2021-11-16 LAB — HIV ANTIBODY (ROUTINE TESTING W REFLEX): HIV Screen 4th Generation wRfx: NONREACTIVE

## 2021-11-16 LAB — ETHANOL: Alcohol, Ethyl (B): <10 mg/dL (ref <10)

## 2021-11-16 LAB — APTT: aPTT: 25 seconds (ref 24–36)

## 2021-11-16 MED ORDER — CLEVIDIPINE BUTYRATE 0.5 MG/ML IV EMUL: 0.0000 mg/h | INTRAVENOUS | Status: DC

## 2021-11-16 MED ORDER — ORAL CARE MOUTH RINSE: 15.0000 mL | OROMUCOSAL | Status: DC | PRN

## 2021-11-16 MED ORDER — TENECTEPLASE FOR STROKE
PACK | INTRAVENOUS | Status: AC
  Administered 2021-11-16: 21 mg via INTRAVENOUS
  Filled 2021-11-16: qty 10

## 2021-11-16 MED ORDER — SODIUM CHLORIDE 0.9 % IV SOLN: INTRAVENOUS | Status: DC

## 2021-11-16 MED ORDER — ACETAMINOPHEN 160 MG/5ML PO SOLN: 650.0000 mg | ORAL | Status: DC | PRN

## 2021-11-16 MED ORDER — SENNOSIDES-DOCUSATE SODIUM 8.6-50 MG PO TABS: 1.0000 | ORAL_TABLET | Freq: Every evening | ORAL | Status: DC | PRN

## 2021-11-16 MED ORDER — PANTOPRAZOLE SODIUM 40 MG IV SOLR
40.0000 mg | Freq: Every day | INTRAVENOUS | Status: DC
  Administered 2021-11-16: 40 mg via INTRAVENOUS
  Filled 2021-11-16: qty 10

## 2021-11-16 MED ORDER — STROKE: EARLY STAGES OF RECOVERY BOOK
Freq: Once | Status: AC
  Filled 2021-11-16: qty 1

## 2021-11-16 MED ORDER — IOHEXOL 350 MG/ML SOLN
75.0000 mL | Freq: Once | INTRAVENOUS | Status: AC | PRN
  Administered 2021-11-16: 75 mL via INTRAVENOUS

## 2021-11-16 MED ORDER — TENECTEPLASE FOR STROKE: 0.2500 mg/kg | PACK | Freq: Once | INTRAVENOUS | Status: AC

## 2021-11-16 MED ORDER — ACETAMINOPHEN 650 MG RE SUPP: 650.0000 mg | RECTAL | Status: DC | PRN

## 2021-11-16 MED ORDER — ACETAMINOPHEN 325 MG PO TABS: 650.0000 mg | ORAL_TABLET | ORAL | Status: DC | PRN

## 2021-11-16 NOTE — ED Notes (Signed)
Up to b/r, unable to get urine sample, states, "will need a urinary hat".

## 2021-11-16 NOTE — ED Notes (Addendum)
Pt removed BP cuff during echo

## 2021-11-16 NOTE — Progress Notes (Signed)
Telestroke RN note: 1207 Code stroke activated. EDP at bedside. 1210 Patient taken to CT via stetcher. 1210 Neurologst paged. 1215 Dr. Quinn Axe connected to telestroke cart for code stroke evaluation. 1217 Patient returned to ED.

## 2021-11-16 NOTE — ED Notes (Signed)
BP cuff reading abnormally high on R wrist, moved to upper arm.

## 2021-11-16 NOTE — Progress Notes (Signed)
Telestroke RN note:  [id number] TNK given. 1241 Patient taken back to CT for CTA.

## 2021-11-16 NOTE — ED Notes (Addendum)
Back from MRI via w/c with RN, no changes, family at [facility], pt directly to b/r with assistance

## 2021-11-16 NOTE — ED Notes (Signed)
Back to stretcher w/o incident or change. No urine collected/ unable.

## 2021-11-16 NOTE — ED Notes (Signed)
Med rec tech, and lab at [facility]

## 2021-11-16 NOTE — ED Notes (Signed)
CODE STROKE called and beeped out @ 1207.

## 2021-11-16 NOTE — Progress Notes (Signed)
Telestroke RN note:  2376 Patient returned from CTA.

## 2021-11-16 NOTE — ED Notes (Signed)
Echo in progress.

## 2021-11-16 NOTE — ED Provider Notes (Signed)
[facility] EMERGENCY DEPARTMENT Provider Note  CSN: [id number] Arrival date & time: 11/16/21 1201  Chief Complaint(s) Code Stroke  HPI [name] is a 51 y.o. female with PMH T2DM, gout, migraines, IBS who presents emergency department for evaluation of slurred speech and agraphia.  Last known well 9 AM which she states that she was at work and noticed that she had difficulty writing and speaking.  Patient arrives with noticeable dysarthria but denies numbness, tingling, weakness or other neurologic complaints.  Does endorse headache.   Past Medical History Past Medical History:  Diagnosis Date   Allergy    Anxiety    Diabetes mellitus without complication ([place])    Gout    History of migraine headaches    Hx of migraines    IBS (irritable bowel syndrome)    Impaired fasting glucose    Patient Active Problem List   Diagnosis Date Noted   Respiratory infection 08/03/2021   Chronic idiopathic gout involving toe of left foot without tophus 09/06/2020   Migraine without aura and without status migrainosus, not intractable 04/04/2020   Seasonal allergic rhinitis 03/03/2020   Uncontrolled type 2 diabetes mellitus with hyperglycemia ([place]) 08/06/2019   Type 2 diabetes mellitus with diabetic polyneuropathy, without long-term current use of insulin ([place]) 02/03/2019   Mixed hyperlipidemia 02/03/2019   B12 deficiency 12/18/2018   NAFL (nonalcoholic fatty liver) 20/25/4270   Elevated liver enzymes 09/22/2018   Chronic insomnia 08/19/2018   Central obesity 08/19/2018   Peripheral polyneuropathy 09/20/2017   Acne rosacea 11/20/2016   Hypertension not at goal 03/26/2016   Vitamin D deficiency 10/29/2014   Class 1 obesity due to excess calories with serious comorbidity and body mass index (BMI) of 33.0 to 33.9 in adult 04/05/2014   Esophageal reflux 10/13/2013   Irritable bowel syndrome with constipation 06/25/2013   Anxiety 02/14/2013   Knee pain, chronic 02/14/2013   Home  Medication(s) Prior to Admission medications   Medication Sig Start Date End Date Taking? Authorizing Provider  allopurinol (ZYLOPRIM) 300 MG tablet Take 1 tablet (300 mg total) by mouth 2 (two) times daily. 10/19/21   Kathyrn Drown, MD  ALPRAZolam Duanne Moron) 0.5 MG tablet Take 1/2-1 tablet (0.25-0.5 mg total) by mouth at bedtime as needed for sleep. *07/24/21 Patient not taking: Reported on 10/15/2021 07/31/21   Nilda Simmer, NP  amitriptyline (ELAVIL) 50 MG tablet TAKE 1 TABLET BY MOUTH AT BEDTIME 09/18/21 09/18/22  Nilda Simmer, NP  Continuous Blood Gluc Sensor (FREESTYLE LIBRE 3 SENSOR) MISC Apply as directed every 14 days 07/31/21   Nilda Simmer, NP  cyanocobalamin (VITAMIN B12) 1000 MCG/ML injection Inject 1 mL into the muscle every 30 days. 07/31/21   Nilda Simmer, NP  cyclobenzaprine (FLEXERIL) 10 MG tablet TAKE 1 TABLET BY MOUTH AT BEDTIME AS NEEDED FOR MUSCLE SPASMS Patient not taking: Reported on 10/15/2021 01/30/21 01/30/22  Nilda Simmer, NP  famotidine (PEPCID) 40 MG tablet Take 1 tablet (40 mg total) by mouth at bedtime. 06/23/21   Thornton Park, MD  gabapentin (NEURONTIN) 600 MG tablet Take 1 tablet (600 mg total) by mouth every 6 (six) hours as needed for pain. 07/31/21   Nilda Simmer, NP  glucose blood (FREESTYLE LITE) test strip Use twice daily as directed to check blood sugar 11/13/21   Kathyrn Drown, MD  lidocaine-prilocaine (EMLA) cream APPLY TO FEET AS NEEDED FOR NERVE PAIN 07/31/21 07/31/22  Nilda Simmer, NP  magic mouthwash (nystatin, hydrocortisone, diphenhydrAMINE) suspension  Swish and spit 5 MLs by mouth  3 times a day as needed 10/29/21   Kathyrn Drown, MD  metFORMIN (GLUCOPHAGE) 1000 MG tablet Take 1 tablet (1,000 mg total) by mouth 2 (two) times daily before a meal. 07/31/21   Nilda Simmer, NP  montelukast (SINGULAIR) 10 MG tablet TAKE 1 TABLET BY MOUTH ONCE A DAY FOR ALLERGIES 05/15/21 05/15/22  Nilda Simmer, NP  omega-3 acid ethyl  esters (LOVAZA) 1 g capsule TAKE 2 CAPSULES BY MOUTH TWO TIMES DAILY 05/15/21 05/15/22  Nilda Simmer, NP  pantoprazole (PROTONIX) 40 MG tablet Take 1 tablet (40 mg total) by mouth 2 (two) times daily before a meal. 06/23/21 06/23/22  Thornton Park, MD  rizatriptan (MAXALT) 10 MG tablet TAKE 1 TABLET BY MOUTH AT ONSET OF MIGRAINE, MAY REPEAT ONCE IN 2 HOURS IF NEEDED **MAX 2 PER 24 HRS Patient not taking: Reported on 10/15/2021 05/15/21 05/15/22  Nilda Simmer, NP  rosuvastatin (CRESTOR) 10 MG tablet Take 1 tablet (10 mg total) by mouth at bedtime. 08/31/21   Kathyrn Drown, MD  tirzepatide [facility]) 15 MG/0.5ML Pen Inject 15 mg into the skin once a week. 05/15/21   Nilda Simmer, NP  Varenicline Tartrate (TYRVAYA) 0.03 MG/ACT SOLN PLACE 1 SQUIRT INTRANASALLY 2 TIMES DAILY 03/10/21 03/10/22    Vitamin D, Ergocalciferol, (DRISDOL) 1.25 MG (50000 UNIT) CAPS capsule Take 1 capsule by mouth every 7 days. 10/27/21   Kathyrn Drown, MD  cetirizine (ZYRTEC) 10 MG tablet Take 1 tablet (10 mg total) by mouth daily. 09/24/19 03/03/20  Sharion Balloon, FNP                                                                                                                                    Past Surgical History Past Surgical History:  Procedure Laterality Date   ABDOMINAL HYSTERECTOMY  2005   partial   BLADDER SUSPENSION  2012   BREAST REDUCTION SURGERY  2002   CHOLECYSTECTOMY  2005   COLONOSCOPY  07/10/2021   EYE SURGERY  1999   laser    UPPER GASTROINTESTINAL ENDOSCOPY     20 years ago by Dr. Gala Romney. - told she had ulcers   Family History Family History  Problem Relation Age of Onset   Diabetes Mother    Neuropathy Mother    Heart failure Mother    Hypertension Mother    Cancer Mother    Atrial fibrillation Father    Hypertension Father    Colonic polyp Father     Social History Social History   Tobacco Use   Smoking status: Never   Smokeless tobacco: Never  Vaping Use   Vaping Use:  Never used  Substance Use Topics   Alcohol use: Yes    Comment: occasional    Drug use: Never   Allergies Omnicef [cefdinir] and Losartan  Review of Systems Review of Systems  Neurological:  Positive for speech difficulty.    Physical Exam Vital Signs  I have reviewed the triage vital signs BP 119/84   Pulse (!) 115   Resp 20   Ht '5\' 5"'$  (1.651 m)   Wt 82.1 kg   SpO2 97%   BMI 30.12 kg/m   Physical Exam Vitals and nursing note reviewed.  Constitutional:      General: She is not in acute distress.    Appearance: She is well-developed.  HENT:     Head: Normocephalic and atraumatic.  Eyes:     Conjunctiva/sclera: Conjunctivae normal.  Cardiovascular:     Rate and Rhythm: Normal rate and regular rhythm.     Heart sounds: No murmur heard. Pulmonary:     Effort: Pulmonary effort is normal. No respiratory distress.     Breath sounds: Normal breath sounds.  Abdominal:     Palpations: Abdomen is soft.     Tenderness: There is no abdominal tenderness.  Musculoskeletal:        General: No swelling.     Cervical back: Neck supple.  Skin:    General: Skin is warm and dry.     Capillary Refill: Capillary refill takes less than 2 seconds.  Neurological:     Mental Status: She is alert.     Cranial Nerves: No cranial nerve deficit.     Sensory: No sensory deficit.     Motor: No weakness.  Psychiatric:        Mood and Affect: Mood normal.   2  ED Results and Treatments Labs (all labs ordered are listed, but only abnormal results are displayed) Labs Reviewed  COMPREHENSIVE METABOLIC PANEL - Abnormal; Notable for the following components:      Result Value   Glucose, Bld 113 (*)    AST 61 (*)    All other components within normal limits  CBG MONITORING, ED - Abnormal; Notable for the following components:   Glucose-Capillary 123 (*)    All other components within normal limits  I-STAT CHEM 8, ED - Abnormal; Notable for the following components:   Chloride 95 (*)     Glucose, Bld 112 (*)    Calcium, Ion 1.04 (*)    All other components within normal limits  RESP PANEL BY RT-PCR (FLU A&B, COVID) ARPGX2  ETHANOL  PROTIME-INR  APTT  CBC  DIFFERENTIAL  RAPID URINE DRUG SCREEN, HOSP PERFORMED  URINALYSIS, ROUTINE W REFLEX MICROSCOPIC  POC URINE PREG, ED                                                                                                                          Radiology CT ANGIO HEAD NECK W WO CM (CODE STROKE)  Result Date: 11/16/2021 CLINICAL DATA:  Follow up Stroke Code EXAM: CT ANGIOGRAPHY HEAD AND NECK TECHNIQUE: Multidetector CT imaging of the head and neck was performed using the standard protocol during bolus administration of intravenous contrast. Multiplanar CT image reconstructions and MIPs  were obtained to evaluate the vascular anatomy. Carotid stenosis measurements (when applicable) are obtained utilizing NASCET criteria, using the distal internal carotid diameter as the denominator. RADIATION DOSE REDUCTION: This exam was performed according to the departmental dose-optimization program which includes automated exposure control, adjustment of the mA and/or kV according to patient size and/or use of iterative reconstruction technique. CONTRAST:  47m OMNIPAQUE IOHEXOL 350 MG/ML SOLN COMPARISON:  None Available. FINDINGS: CT HEAD FINDINGS Brain: No evidence of acute infarction, hemorrhage, hydrocephalus, extra-axial collection or mass lesion/mass effect. Vascular: No hyperdense vessel or unexpected calcification. Skull: Normal. Negative for fracture or focal lesion. Sinuses/Orbits: No acute finding. Other: None. Review of the MIP images confirms the above findings CTA NECK FINDINGS Aortic arch: 2 vessel arch. No significant stenosis of the branch vessels. Right carotid system: No evidence of dissection, stenosis (50% or greater), or occlusion. Left carotid system: No evidence of dissection, stenosis (50% or greater), or occlusion. Vertebral  arteries: Codominant. No evidence of dissection, stenosis (50% or greater), or occlusion. Skeleton: Negative. Other neck: Negative. Upper chest: Negative. Review of the MIP images confirms the above findings CTA HEAD FINDINGS Anterior circulation: No significant stenosis, proximal occlusion, aneurysm, or vascular malformation. Posterior circulation: No significant stenosis, proximal occlusion, aneurysm, or vascular malformation. Venous sinuses: As permitted by contrast timing, patent. Anatomic variants: None Review of the MIP images confirms the above findings IMPRESSION: No intracranial large vessel occlusion or hemodynamically significant stenosis in the neck. Electronically Signed   By: HMarin RobertsM.D.   On: 11/16/2021 13:13   CT HEAD CODE STROKE WO CONTRAST  Result Date: 11/16/2021 CLINICAL DATA:  Code stroke.  Aphasia and agraphia. EXAM: CT HEAD WITHOUT CONTRAST TECHNIQUE: Contiguous axial images were obtained from the base of the skull through the vertex without intravenous contrast. RADIATION DOSE REDUCTION: This exam was performed according to the departmental dose-optimization program which includes automated exposure control, adjustment of the mA and/or kV according to patient size and/or use of iterative reconstruction technique. COMPARISON:  None Available. FINDINGS: Brain: No definite evidence of acute infarction, hemorrhage, hydrocephalus, extra-axial collection or mass lesion/mass effect. There is a subtle asymmetric hypodensity in the medial aspect of the left temporal lobe (series 2, image 14) Vascular: No hyperdense vessel or unexpected calcification. Skull: Normal. Negative for fracture or focal lesion. Sinuses/Orbits: No acute finding. Other: None. ASPECTS ([facility] Early CT Score) Total score (0-10 with 10 being normal): 10 IMPRESSION: 1. No evidence of hemorrhage or CT evidence of an acute infarct. There is a subtle asymmetric hypodensity in the posterior left temporal  lobe, which may be artifactual. 2. ASPECTS is 10 Discussed with Dr. KMatilde Sprangon 11/16/21 at 12:19 PM via telephone Electronically Signed   By: HMarin RobertsM.D.   On: 11/16/2021 12:27    Pertinent labs & imaging results that were available during my care of the patient were reviewed by me and considered in my medical decision making (see MDM for details).  Medications Ordered in ED Medications  clevidipine (CLEVIPREX) infusion 0.5 mg/mL (has no administration in time range)  tenecteplase (TNKASE) injection for Stroke 21 mg (21 mg Intravenous Given 11/16/21 1239)  iohexol (OMNIPAQUE) 350 MG/ML injection 75 mL (75 mLs Intravenous Contrast Given 11/16/21 1248)  Procedures .Critical Care  Performed by: Teressa Lower, MD Authorized by: Teressa Lower, MD   Critical care provider statement:    Critical care time (minutes):  30   Critical care was necessary to treat or prevent imminent or life-threatening deterioration of the following conditions: CVA with TNK administration.   Critical care was time spent personally by me on the following activities:  Development of treatment plan with patient or surrogate, discussions with consultants, evaluation of patient's response to treatment, examination of patient, ordering and review of laboratory studies, ordering and review of radiographic studies, ordering and performing treatments and interventions, pulse oximetry, re-evaluation of patient's condition and review of old charts   (including critical care time)  Medical Decision Making / ED Course   This patient presents to the ED for concern of dysarthria, agraphia, this involves an extensive number of treatment options, and is a complaint that carries with it a high risk of complications and morbidity.  The differential diagnosis includes CVA, TIA, complex  migraine  MDM: Patient seen in the emergency department for evaluation of multiple neurologic complaints as described above.  Patient within the window for TNK administration based on the last known well of 9 AM and a stroke alert was called.  Telestroke evaluated patient at bedside and with initial CT head without hemorrhage and possibly something in the left temporal lobe, the decision was made to administer TNK.  Follow-up CT angio and MRI brain negative.  Additional laboratory evaluation unremarkable.  Patient then admitted to the stroke neurology service.   Additional history obtained: -Additional history obtained from husband -External records from outside source obtained and reviewed including: Chart review including previous notes, labs, imaging, consultation notes   Lab Tests: -I ordered, reviewed, and interpreted labs.   The pertinent results include:   Labs Reviewed  COMPREHENSIVE METABOLIC PANEL - Abnormal; Notable for the following components:      Result Value   Glucose, Bld 113 (*)    AST 61 (*)    All other components within normal limits  CBG MONITORING, ED - Abnormal; Notable for the following components:   Glucose-Capillary 123 (*)    All other components within normal limits  I-STAT CHEM 8, ED - Abnormal; Notable for the following components:   Chloride 95 (*)    Glucose, Bld 112 (*)    Calcium, Ion 1.04 (*)    All other components within normal limits  RESP PANEL BY RT-PCR (FLU A&B, COVID) ARPGX2  ETHANOL  PROTIME-INR  APTT  CBC  DIFFERENTIAL  RAPID URINE DRUG SCREEN, HOSP PERFORMED  URINALYSIS, ROUTINE W REFLEX MICROSCOPIC  POC URINE PREG, ED      EKG   EKG Interpretation  Date/Time:  Monday November 16 2021 12:20:54 EDT Ventricular Rate:  115 PR Interval:  154 QRS Duration: 89 QT Interval:  345 QTC Calculation: 478 R Axis:   63 Text Interpretation: Sinus tachycardia Confirmed by [place] ([id number]) on 11/16/2021 8:10:37 PM          Imaging Studies ordered: I ordered imaging studies including CTH, CT angio brain neck I independently visualized and interpreted imaging. I agree with the radiologist interpretation   Medicines ordered and prescription drug management: Meds ordered this encounter  Medications   tenecteplase (TNKASE) 50 MG injection for Stroke    White, Meagan R: cabinet override   tenecteplase (TNKASE) injection for Stroke 21 mg   clevidipine (CLEVIPREX) infusion 0.5 mg/mL   iohexol (OMNIPAQUE) 350 MG/ML injection 75 mL    -  I have reviewed the patients home medicines and have made adjustments as needed  Critical interventions Stroke alert and administration of TNK  Consultations Obtained: I requested consultation with the stroke neurologist Dr. Quinn Axe,  and discussed lab and imaging findings as well as pertinent plan - they recommend: TNK and hospital admission   Cardiac Monitoring: The patient was maintained on a cardiac monitor.  I personally viewed and interpreted the cardiac monitored which showed an underlying rhythm of: NSR  Social Determinants of Health:  Factors impacting patients care include: none   Reevaluation: After the interventions noted above, I reevaluated the patient and found that they have :stayed the same  Co morbidities that complicate the patient evaluation  Past Medical History:  Diagnosis Date   Allergy    Anxiety    Diabetes mellitus without complication ([facility])    Gout    History of migraine headaches    Hx of migraines    IBS (irritable bowel syndrome)    Impaired fasting glucose       Dispostion: I considered admission for this patient, and due to concern for CVA and TNK administration, patient require hospital admission     Final Clinical Impression(s) / ED Diagnoses Final diagnoses:  None     '@PCDICTATION'$ @    Teressa Lower, MD 11/16/21 2012

## 2021-11-16 NOTE — Progress Notes (Signed)
*  PRELIMINARY RESULTS* Echocardiogram 2D Echocardiogram has been performed.  [name] 11/16/2021, 4:09 PM

## 2021-11-16 NOTE — ED Notes (Signed)
EDP at [facility] 

## 2021-11-16 NOTE — ED Notes (Addendum)
Back from b/r via w/c with RN, family x2 at [facility]. Echo into room.

## 2021-11-16 NOTE — ED Notes (Signed)
Up to b/r via w/c with RN, and husband at side.

## 2021-11-16 NOTE — ED Notes (Signed)
Pt given saltine crackers at this time per MD approval.

## 2021-11-16 NOTE — ED Triage Notes (Signed)
Pt to the ED with complaints of slurred speech, trouble with her right hand, and a headache.  Last known well was at 0700 this morning.

## 2021-11-16 NOTE — ED Notes (Signed)
EDP at [facility]. Labs obtained and sent. Pt to CT on monitor with RN at this time. Airway patent. Alert, NAD, calm.

## 2021-11-16 NOTE — ED Notes (Signed)
Echo delayed d/t MRI. MRI tech at [facility].

## 2021-11-16 NOTE — Consult Note (Addendum)
NEUROLOGY TELECONSULTATION NOTE   Date of service: November 16, 2021 Patient Name: [name] MRN:  [id number] DOB:  1970-12-23 Reason for consult: Dr. Teressa Lower  Requesting Provider: Dr. Teressa Lower Consult Participants: myself, patient, bedside RN, telestroke RN Location of the provider: [facility] Location of the patient: [facility]  This consult was provided via telemedicine with 2-way video and audio communication. The patient/family was informed that care would be provided in this way and agreed to receive care in this manner.  _ _ _   _ __   _ __ _ _  __ __   _ __   __ _  History of Present Illness   51 yo woman with hx DM2, HTN, HL p/w R sided weakness and dysarthria acute onset. She is not on anticoagulation. LKW 0900 this AM at which point she developed R sided weakness and slurred speech. She was dropping papers out of her R hand and felt some numbness there as well. Sx persist. Her dysarthria is getting worse since arrival to ED. NIHSS = 5. Head CT showed no e/o hemorrhage or definitive e/o acute infarct although she did have a subtle asymmetric hypodensity in the left posterior left temporal lobe which may be artifactual.  Aspect score was 10.  Risks and benefits and alternatives of thrombolytics were discussed with the patient and she gave informed consent to proceed.  TNK was administered. CTA head and neck showed no LVO and no hemodynamically significant stenosis therefore no intervention was indicated.  Of note she has hx migraines and does currently have mild headache but has never had neurologic sx with headaches before. She also has multiple cerebrovascular risk factors.  All CNS imaging personally reviewed.   ROS   Per HPI; all other systems reviewed and are negative  Past History   The following was personally reviewed:  Past Medical History:  Diagnosis Date   Allergy    Anxiety    Diabetes mellitus without complication ([place])    Gout    History of migraine  headaches    Hx of migraines    IBS (irritable bowel syndrome)    Impaired fasting glucose    Past Surgical History:  Procedure Laterality Date   ABDOMINAL HYSTERECTOMY  2005   partial   BLADDER SUSPENSION  2012   BREAST REDUCTION SURGERY  2002   CHOLECYSTECTOMY  2005   COLONOSCOPY  07/10/2021   EYE SURGERY  1999   laser    UPPER GASTROINTESTINAL ENDOSCOPY     20 years ago by Dr. Gala Romney. 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  Tobacco Use   Smoking status: Never   Smokeless tobacco: Never  Vaping Use   Vaping Use: Never used  Substance and Sexual Activity   Alcohol use: Yes    Comment: occasional    Drug use: Never  Sexual activity: Yes    Birth control/protection: Surgical  Other Topics Concern   Not on file  Social History Narrative   Lives at home with husband and dog   Right handed   Caffeine: 3 cups daily   Social Determinants of Health   Financial Resource Strain: Not on file  Food Insecurity: Not on file  Transportation Needs: Not on file  Physical Activity: Not on file  Stress: Not on file  Social Connections: Not on file   Allergies  Allergen Reactions   Omnicef [Cefdinir] Other (See Comments)    Patient states antibiotic does not work. Has tried multiple times.   Losartan Rash    Broke out on face only; no allergic reaction    Medications   (Not in a hospital admission)    No current facility-administered medications for this encounter.  Current  Outpatient Medications:    allopurinol (ZYLOPRIM) 300 MG tablet, Take 1 tablet (300 mg total) by mouth 2 (two) times daily., Disp: 180 tablet, Rfl: 0   ALPRAZolam (XANAX) 0.5 MG tablet, Take 1/2-1 tablet (0.25-0.5 mg total) by mouth at bedtime as needed for sleep. *07/24/21 (Patient not taking: Reported on 10/15/2021), Disp: 30 tablet, Rfl: 1   amitriptyline (ELAVIL) 50 MG tablet, TAKE 1 TABLET BY MOUTH AT BEDTIME, Disp: 90 tablet, Rfl: 0   Continuous Blood Gluc Sensor (FREESTYLE LIBRE 3 SENSOR) MISC, Apply as directed every 14 days, Disp: 6 each, Rfl: 1   cyanocobalamin (VITAMIN B12) 1000 MCG/ML injection, Inject 1 mL into the muscle every 30 days., Disp: 3 mL, Rfl: 0   cyclobenzaprine (FLEXERIL) 10 MG tablet, TAKE 1 TABLET BY MOUTH AT BEDTIME AS NEEDED FOR MUSCLE SPASMS (Patient not taking: Reported on 10/15/2021), Disp: 90 tablet, Rfl: 1   famotidine (PEPCID) 40 MG tablet, Take 1 tablet (40 mg total) by mouth at bedtime., Disp: 90 tablet, Rfl: 3   gabapentin (NEURONTIN) 600 MG tablet, Take 1 tablet (600 mg total) by mouth every 6 (six) hours as needed for pain., Disp: 360 tablet, Rfl: 2   glucose blood (FREESTYLE LITE) test strip, Use twice daily as directed to check blood sugar, Disp: 100 each, Rfl: 1   lidocaine-prilocaine (EMLA) cream, APPLY TO FEET AS NEEDED FOR NERVE PAIN, Disp: 30 g, Rfl: 5   magic mouthwash (nystatin, hydrocortisone, diphenhydrAMINE) suspension, Swish and spit 5 MLs by mouth  3 times a day as needed, Disp: 540 mL, Rfl: 2   metFORMIN (GLUCOPHAGE) 1000 MG tablet, Take 1 tablet (1,000 mg total) by mouth 2 (two) times daily before a meal., Disp: 180 tablet, Rfl: 1   montelukast (SINGULAIR) 10 MG tablet, TAKE 1 TABLET BY MOUTH ONCE A DAY FOR ALLERGIES, Disp: 90 tablet, Rfl: 1   omega-3 acid ethyl esters (LOVAZA) 1 g capsule, TAKE 2 CAPSULES BY MOUTH TWO TIMES DAILY, Disp: 360 capsule, Rfl: 1   pantoprazole (PROTONIX) 40 MG tablet, Take 1 tablet (40 mg total) by mouth 2 (two) times  daily before a meal., Disp: 180 tablet, Rfl: 3   rizatriptan (MAXALT) 10 MG tablet, TAKE 1 TABLET BY MOUTH AT ONSET OF MIGRAINE, MAY REPEAT ONCE IN 2 HOURS IF NEEDED **MAX 2 PER 24 HRS (Patient not taking: Reported on 10/15/2021), Disp: 30 tablet, Rfl: 0   rosuvastatin (CRESTOR) 10 MG tablet, Take 1 tablet (10 mg total) by mouth at bedtime., Disp: 90 tablet, Rfl: 1   tirzepatide (MOUNJARO) 15 MG/0.5ML Pen, Inject 15 mg into the skin once a week., Disp: 6 mL,  Rfl: 1   Varenicline Tartrate (TYRVAYA) 0.03 MG/ACT SOLN, PLACE 1 SQUIRT INTRANASALLY 2 TIMES DAILY, Disp: 8.4 mL, Rfl: 12   Vitamin D, Ergocalciferol, (DRISDOL) 1.25 MG (50000 UNIT) CAPS capsule, Take 1 capsule by mouth every 7 days., Disp: 8 capsule, Rfl: 0  Vitals   Vitals:   11/16/21 1207 11/16/21 1211  BP: (!) 144/80   Pulse: (!) 119   Resp: 20   SpO2: 99%   Weight:  84.4 kg  Height:  '5\' 5"'$  (1.651 m)     Body mass index is 30.96 kg/m.  Physical Exam   Exam performed over telemedicine with 2-way video and audio communication and with assistance of bedside RN  Physical Exam Gen: A&O x4, NAD Resp: normal WOB CV: extremities appear well-perfused  Neuro: *MS: A&O x4. Follows multi-step commands.  *Speech: moderate dysarthria, no aphasia, able to name and repeat *CN: PERRL 56m, EOMI, VFF by confrontation, sensation intact, R UMN facial droop, hearing intact to voice *Motor:   Normal bulk.  No tremor, rigidity or bradykinesia. LUE and LLE no drift. RUE and RLE each drift but not to bed. *Sensory: RUE sensory deficit to LT, otherwise intact throughout. No double-simultaneous extinction.  *Coordination:  FNF intact bilat *Reflexes:  UTA 2/2 tele-exam *Gait: deferred  NIHSS  1a Level of Conscious.: 0 1b LOC Questions: 0 1c LOC Commands: 0 2 Best Gaze: 0 3 Visual: 0 4 Facial Palsy: 1 5a Motor Arm - left: 0 5b Motor Arm - Right: 1 6a Motor Leg - Left: 0 6b Motor Leg - Right: 1 7 Limb Ataxia: 0 8 Sensory: 1 9 Best  Language: 0 10 Dysarthria: 1 11 Extinct. and Inatten.: 0  TOTAL: 5   Premorbid mRS = 0   Labs   CBC:  Recent Labs  Lab 11/16/21 1212  WBC 8.2  NEUTROABS 3.9  HGB 12.4  HCT 39.3  MCV 86.4  PLT 3939   Basic Metabolic Panel:  Lab Results  Component Value Date   NA 137 05/11/2021   K 4.1 05/11/2021   CO2 26 05/11/2021   GLUCOSE 129 (H) 05/11/2021   BUN 10 05/11/2021   CREATININE 0.75 05/11/2021   CALCIUM 9.7 05/11/2021   GFRNONAA 106 03/13/2020   GFRAA 122 03/13/2020   Lipid Panel:  Lab Results  Component Value Date   LDLCALC 62 05/11/2021   HgbA1c:  Lab Results  Component Value Date   HGBA1C 7.5 (H) 05/11/2021   Urine Drug Screen: No results found for: "LABOPIA", "COCAINSCRNUR", "LABBENZ", "AMPHETMU", "THCU", "LABBARB"  Alcohol Level No results found for: "ETH"   Impression   51yo woman with hx DM2, HTN, HL p/w acute onset R sided weakness and dysarthria c/f acute ischemic infarct. [facility] received TNK [facility] further stroke workup.  Recommendations   - [facility] Dr. SAnnice Pih- Neurochecks and NIHSS documentation per post-tNK protocol - STAT head CT for any change in neurologic exam - Non-con head CT 24 hrs post-tNK r/o hemorrhagic conversion - MRI brain wo contrast - Head CT at 24 hrs r/o interval hemorrhagic conversion - TTE - no aspirin for 24 hours post IV t-PA and until ICH ruled out by a head CT - keep SBP less than 180/105 for the 1st 24 hours post IV t-NK to reduce the risk of hemorrhagic transformation - SCDs for DVT prophylaxis; can start SQ Heparin if  head CT 24 hours post IV tPA is negative for ICH - NPO; swallow study - PT/OT and speech therapy - stroke education - outpatient f/u with neurology after discharge   Please page [facility] neurohospitalist on patient's arrival to [facility]   This patient is critically ill and at significant  risk of neurological worsening, death and care requires constant monitoring of vital signs, hemodynamics,respiratory and cardiac monitoring, neurological assessment, discussion with family, other specialists and medical decision making of high complexity. I spent 95 minutes of neurocritical care time  in the care of  this patient. This was time spent independent of any time provided by nurse practitioner or PA.  Su Monks, MD [facility] [phone]  If 7pm- 7am, please page neurology on call as listed in [place].

## 2021-11-16 NOTE — ED Notes (Signed)
Back from CT into exam room.

## 2021-11-16 NOTE — ED Notes (Signed)
Back from b/r, urine obtained and sent. MRI ready. Family x2 at [facility].

## 2021-11-16 NOTE — ED Notes (Signed)
Neuro exam in progress. Husband at [facility].

## 2021-11-16 NOTE — ED Notes (Signed)
To CT via stretcher now, on monitor with RN.

## 2021-11-17 ENCOUNTER — Inpatient Hospital Stay (HOSPITAL_COMMUNITY): Payer: 59

## 2021-11-17 ENCOUNTER — Other Ambulatory Visit (HOSPITAL_COMMUNITY): Payer: Self-pay

## 2021-11-17 DIAGNOSIS — I639 Cerebral infarction, unspecified: Secondary | ICD-10-CM | POA: Diagnosis not present

## 2021-11-17 LAB — LIPID PANEL
Cholesterol: 103 mg/dL (ref 0–200)
HDL: 27 mg/dL — ABNORMAL LOW (ref >40)
LDL Cholesterol: 38 mg/dL (ref 0–99)
Total CHOL/HDL Ratio: 3.8 RATIO
Triglycerides: 191 mg/dL — ABNORMAL HIGH (ref <150)
VLDL: 38 mg/dL (ref 0–40)

## 2021-11-17 LAB — GLUCOSE, CAPILLARY
Glucose-Capillary: 119 mg/dL — ABNORMAL HIGH (ref 70–99)
Glucose-Capillary: 123 mg/dL — ABNORMAL HIGH (ref 70–99)
Glucose-Capillary: 125 mg/dL — ABNORMAL HIGH (ref 70–99)
Glucose-Capillary: 140 mg/dL — ABNORMAL HIGH (ref 70–99)

## 2021-11-17 LAB — MRSA NEXT GEN BY PCR, NASAL: MRSA by PCR Next Gen: NOT DETECTED

## 2021-11-17 MED ORDER — INSULIN ASPART 100 UNIT/ML IJ SOLN
0.0000 [IU] | INTRAMUSCULAR | Status: DC
  Administered 2021-11-17 (×2): 2 [IU] via SUBCUTANEOUS

## 2021-11-17 MED ORDER — CHLORHEXIDINE GLUCONATE CLOTH 2 % EX PADS: 6.0000 | MEDICATED_PAD | Freq: Every day | CUTANEOUS | Status: DC

## 2021-11-17 MED ORDER — ASPIRIN 81 MG PO TBEC
81.0000 mg | DELAYED_RELEASE_TABLET | Freq: Every day | ORAL | 0 refills | Status: AC
  Filled 2021-11-17: qty 30, 30d supply, fill #0

## 2021-11-17 MED ORDER — INSULIN ASPART 100 UNIT/ML IJ SOLN: 0.0000 [IU] | Freq: Every day | INTRAMUSCULAR | Status: DC

## 2021-11-17 MED ORDER — INSULIN ASPART 100 UNIT/ML IJ SOLN
0.0000 [IU] | Freq: Three times a day (TID) | INTRAMUSCULAR | Status: DC
  Administered 2021-11-17: 2 [IU] via SUBCUTANEOUS

## 2021-11-17 MED ORDER — AMITRIPTYLINE HCL 25 MG PO TABS
50.0000 mg | ORAL_TABLET | Freq: Every day | ORAL | Status: DC
  Administered 2021-11-17: 50 mg via ORAL
  Filled 2021-11-17: qty 2

## 2021-11-17 MED ORDER — CLOPIDOGREL BISULFATE 75 MG PO TABS
75.0000 mg | ORAL_TABLET | Freq: Every day | ORAL | 0 refills | Status: DC
  Filled 2021-11-17: qty 21, 21d supply, fill #0

## 2021-11-17 MED ORDER — PANTOPRAZOLE SODIUM 40 MG PO TBEC: 40.0000 mg | DELAYED_RELEASE_TABLET | Freq: Every day | ORAL | Status: DC

## 2021-11-17 NOTE — H&P (Signed)
Neurology H&P  CC: Right-sided weakness  History is obtained from: Patient  HPI: [name] is a 51 y.o. female who was in her normal state of health until nine this morning.  She was at work and began dropping things out of her right hand, and having some abnormal speech.  She also had some numbness, but no tingling.  She denies any positive visual symptoms or positive sensory symptoms.  She went to the emergency department at [facility] around noon where code stroke was called and she had an NIHSS of five.  She was given tenecteplase by Dr. Quinn Axe.  She reports that relatively soon after receiving thrombolytics, she had significant improvement.     LKW: 9 AM tnk given?:  Yes IR Thrombectomy? No, no LVO Modified Rankin Scale: 0-Completely asymptomatic and back to baseline post- stroke NIHSS: 0  Past Medical History:  Diagnosis Date   Allergy    Anxiety    Diabetes mellitus without complication ([facility])    Gout    History of migraine headaches    Hx of migraines    IBS (irritable bowel syndrome)    Impaired fasting glucose      Family History  Problem Relation Age of Onset   Diabetes Mother    Neuropathy Mother    Heart failure Mother    Hypertension Mother    Cancer Mother    Atrial fibrillation Father    Hypertension Father    Colonic polyp Father      Social History:  reports that she has never smoked. She has never used smokeless tobacco. She reports current alcohol use. She reports that she does not use drugs.   Prior to Admission medications   Medication Sig Start Date End Date Taking? Authorizing Provider  allopurinol (ZYLOPRIM) 300 MG tablet Take 1 tablet (300 mg total) by mouth 2 (two) times daily. 10/19/21  Yes Kathyrn Drown, MD  ALPRAZolam Duanne Moron) 0.5 MG tablet Take 1/2-1 tablet (0.25-0.5 mg total) by mouth at bedtime as needed for sleep. *07/24/21 07/31/21  Yes Nilda Simmer, NP  amitriptyline (ELAVIL) 50 MG tablet TAKE 1 TABLET BY MOUTH AT  BEDTIME Patient taking differently: Take 50 mg by mouth at bedtime. 09/18/21 09/18/22 Yes Nilda Simmer, NP  cyanocobalamin (VITAMIN B12) 1000 MCG/ML injection Inject 1 mL into the muscle every 30 days. 07/31/21  Yes Nilda Simmer, NP  cyclobenzaprine (FLEXERIL) 10 MG tablet Take 10 mg by mouth daily as needed for muscle spasms.   Yes [provider]  famotidine (PEPCID) 40 MG tablet Take 1 tablet (40 mg total) by mouth at bedtime. 06/23/21  Yes Thornton Park, MD  gabapentin (NEURONTIN) 600 MG tablet Take 1 tablet (600 mg total) by mouth every 6 (six) hours as needed for pain. Patient taking differently: Take 600 mg by mouth 2 (two) times daily as needed. 07/31/21  Yes Pearson Forster C, NP  lidocaine-prilocaine (EMLA) cream APPLY TO FEET AS NEEDED FOR NERVE PAIN Patient taking differently: Apply 1 Application topically daily as needed (nerve pain). 07/31/21 07/31/22 Yes Hoskins, Katharine Look, NP  LOTEMAX SM 0.38 % GEL Place 1 Application into the left eye daily. 10/28/21  Yes [provider]  magic mouthwash (nystatin, hydrocortisone, diphenhydrAMINE) suspension Swish and spit 5 MLs by mouth  3 times a day as needed Patient taking differently: Swish and spit 5 mLs in the morning, at noon, and at bedtime. 10/29/21  Yes Kathyrn Drown, MD  metFORMIN (GLUCOPHAGE) 1000 MG tablet  Take 1 tablet (1,000 mg total) by mouth 2 (two) times daily before a meal. 07/31/21  Yes Hoskins, [name] C, NP  montelukast (SINGULAIR) 10 MG tablet TAKE 1 TABLET BY MOUTH ONCE A DAY FOR ALLERGIES Patient taking differently: Take 10 mg by mouth at bedtime as needed (allergies). 05/15/21 05/15/22 Yes Nilda Simmer, NP  omega-3 acid ethyl esters (LOVAZA) 1 g capsule TAKE 2 CAPSULES BY MOUTH TWO TIMES DAILY 05/15/21 05/15/22 Yes Nilda Simmer, NP  pantoprazole (PROTONIX) 40 MG tablet Take 1 tablet (40 mg total) by mouth 2 (two) times daily before a meal. 06/23/21 06/23/22 Yes Thornton Park, MD  rosuvastatin  (CRESTOR) 10 MG tablet Take 1 tablet (10 mg total) by mouth at bedtime. 08/31/21  Yes Luking, Elayne Snare, MD  tirzepatide [facility]) 15 MG/0.5ML Pen Inject 15 mg into the skin once a week. 05/15/21  Yes Nilda Simmer, NP  Varenicline Tartrate (TYRVAYA) 0.03 MG/ACT SOLN PLACE 1 SQUIRT INTRANASALLY 2 TIMES DAILY Patient taking differently: Place 1 spray into the nose in the morning and at bedtime. 03/10/21 03/10/22 Yes   Vitamin D, Ergocalciferol, (DRISDOL) 1.25 MG (50000 UNIT) CAPS capsule Take 1 capsule by mouth every 7 days. 10/27/21  Yes Kathyrn Drown, MD  Continuous Blood Gluc Sensor (FREESTYLE LIBRE 3 SENSOR) MISC Apply as directed every 14 days 07/31/21   Nilda Simmer, NP  glucose blood (FREESTYLE LITE) test strip Use twice daily as directed to check blood sugar 11/13/21   Kathyrn Drown, MD  cetirizine (ZYRTEC) 10 MG tablet Take 1 tablet (10 mg total) by mouth daily. 09/24/19 03/03/20  Sharion Balloon, FNP     Exam: Current vital signs: BP 131/85   Pulse (!) 111   Temp 97.8 F (36.6 C) (Oral)   Resp 20   Ht '5\' 5"'$  (1.651 m)   Wt 82.1 kg   SpO2 99%   BMI 30.12 kg/m    Physical Exam  Constitutional: Appears well-developed and well-nourished.  Psych: Affect appropriate to situation Eyes: No scleral injection HENT: No OP obstrucion Head: Normocephalic.  Cardiovascular: Normal rate and regular rhythm.  Respiratory: Effort normal and breath sounds normal to anterior ascultation GI: Soft.  No distension. There is no tenderness.  Skin: WDI  Neuro: Mental Status: Patient is awake, alert, oriented to person, place, month, year, and situation. Patient is able to give a clear and coherent history. No signs of aphasia or neglect Cranial Nerves: II: Visual Fields are full. Pupils are equal, round, and reactive to light.   III,IV, VI: EOMI without ptosis or diploplia.  V: Facial sensation is symmetric to temperature VII: Facial movement is symmetric.  VIII: hearing is intact to  voice X: Uvula elevates symmetrically XI: Shoulder shrug is symmetric. XII: tongue is midline without atrophy or fasciculations.  Motor: Tone is normal. Bulk is normal. 5/5 strength was present in all four extremities.  Sensory: Sensation is symmetric to light touch and temperature in the arms and legs. Cerebellar: FNF and HKS are intact bilaterally   I have reviewed labs in epic and the pertinent results are: Results for orders placed or performed during the hospital encounter of 11/16/21 (from the past 24 hour(s))  CBG monitoring, ED     Status: Abnormal   Collection Time: 11/16/21 12:07 PM  Result Value Ref Range   Glucose-Capillary 123 (H) 70 - 99 mg/dL  Protime-INR     Status: None   Collection Time: 11/16/21 12:12 PM  Result Value Ref Range  Prothrombin Time 13.3 11.4 - 15.2 seconds   INR 1.0 0.8 - 1.2  APTT     Status: None   Collection Time: 11/16/21 12:12 PM  Result Value Ref Range   aPTT 25 24 - 36 seconds  CBC     Status: None   Collection Time: 11/16/21 12:12 PM  Result Value Ref Range   WBC 8.2 4.0 - 10.5 K/uL   RBC 4.55 3.87 - 5.11 MIL/uL   Hemoglobin 12.4 12.0 - 15.0 g/dL   HCT 39.3 36.0 - 46.0 %   MCV 86.4 80.0 - 100.0 fL   MCH 27.3 26.0 - 34.0 pg   MCHC 31.6 30.0 - 36.0 g/dL   RDW 14.6 11.5 - 15.5 %   Platelets 347 150 - 400 K/uL   nRBC 0.0 0.0 - 0.2 %  Differential     Status: None   Collection Time: 11/16/21 12:12 PM  Result Value Ref Range   Neutrophils Relative % 46 %   Neutro Abs 3.9 1.7 - 7.7 K/uL   Lymphocytes Relative 44 %   Lymphs Abs 3.6 0.7 - 4.0 K/uL   Monocytes Relative 7 %   Monocytes Absolute 0.6 0.1 - 1.0 K/uL   Eosinophils Relative 2 %   Eosinophils Absolute 0.2 0.0 - 0.5 K/uL   Basophils Relative 1 %   Basophils Absolute 0.1 0.0 - 0.1 K/uL   Immature Granulocytes 0 %   Abs Immature Granulocytes 0.01 0.00 - 0.07 K/uL  Comprehensive metabolic panel     Status: Abnormal   Collection Time: 11/16/21 12:12 PM  Result Value Ref  Range   Sodium 137 135 - 145 mmol/L   Potassium 3.7 3.5 - 5.1 mmol/L   Chloride 98 98 - 111 mmol/L   CO2 28 22 - 32 mmol/L   Glucose, Bld 113 (H) 70 - 99 mg/dL   BUN 10 6 - 20 mg/dL   Creatinine, Ser 0.72 0.44 - 1.00 mg/dL   Calcium 9.2 8.9 - 10.3 mg/dL   Total Protein 7.3 6.5 - 8.1 g/dL   Albumin 4.2 3.5 - 5.0 g/dL   AST 61 (H) 15 - 41 U/L   ALT 34 0 - 44 U/L   Alkaline Phosphatase 69 38 - 126 U/L   Total Bilirubin 0.6 0.3 - 1.2 mg/dL   GFR, Estimated >60 >60 mL/min   Anion gap 11 5 - 15  Hemoglobin A1c     Status: Abnormal   Collection Time: 11/16/21 12:12 PM  Result Value Ref Range   Hgb A1c MFr Bld 6.8 (H) 4.8 - 5.6 %   Mean Plasma Glucose 148.46 mg/dL  Ethanol     Status: None   Collection Time: 11/16/21 12:13 PM  Result Value Ref Range   Alcohol, Ethyl (B) <10 <10 mg/dL  Resp Panel by RT-PCR (Flu A&B, Covid) Anterior Nasal Swab     Status: None   Collection Time: 11/16/21 12:14 PM   Specimen: Anterior Nasal Swab  Result Value Ref Range   SARS Coronavirus 2 by RT PCR NEGATIVE NEGATIVE   Influenza A by PCR NEGATIVE NEGATIVE   Influenza B by PCR NEGATIVE NEGATIVE  I-stat chem 8, ED     Status: Abnormal   Collection Time: 11/16/21 12:14 PM  Result Value Ref Range   Sodium 137 135 - 145 mmol/L   Potassium 3.8 3.5 - 5.1 mmol/L   Chloride 95 (L) 98 - 111 mmol/L   BUN 8 6 - 20 mg/dL   Creatinine, Ser  0.70 0.44 - 1.00 mg/dL   Glucose, Bld 112 (H) 70 - 99 mg/dL   Calcium, Ion 1.04 (L) 1.15 - 1.40 mmol/L   TCO2 29 22 - 32 mmol/L   Hemoglobin 13.9 12.0 - 15.0 g/dL   HCT 41.0 36.0 - 46.0 %  HIV Antibody (routine testing w rflx)     Status: None   Collection Time: 11/16/21 12:14 PM  Result Value Ref Range   HIV Screen 4th Generation wRfx Non Reactive Non Reactive  Pregnancy, urine     Status: None   Collection Time: 11/16/21  8:40 PM  Result Value Ref Range   Preg Test, Ur NEGATIVE NEGATIVE  Urinalysis, Routine w reflex microscopic Urine, Clean Catch     Status: Abnormal    Collection Time: 11/16/21  8:40 PM  Result Value Ref Range   Color, Urine STRAW (A) YELLOW   APPearance CLEAR CLEAR   Specific Gravity, Urine 1.014 1.005 - 1.030   pH 7.0 5.0 - 8.0   Glucose, UA NEGATIVE NEGATIVE mg/dL   Hgb urine dipstick NEGATIVE NEGATIVE   Bilirubin Urine NEGATIVE NEGATIVE   Ketones, ur NEGATIVE NEGATIVE mg/dL   Protein, ur NEGATIVE NEGATIVE mg/dL   Nitrite NEGATIVE NEGATIVE   Leukocytes,Ua NEGATIVE NEGATIVE  MRSA Next Gen by PCR, Nasal     Status: None   Collection Time: 11/16/21 11:14 PM   Specimen: Nasal Mucosa; Nasal Swab  Result Value Ref Range   MRSA by PCR Next Gen NOT DETECTED NOT DETECTED  Glucose, capillary     Status: Abnormal   Collection Time: 11/17/21 12:41 AM  Result Value Ref Range   Glucose-Capillary 125 (H) 70 - 99 mg/dL     I have reviewed the images obtained: CT head-unremarkable, CTA-no LVO MRI brain-negative Echo-no embolic source  Primary Diagnosis:  Cerebral infarction, unspecified.  Secondary Diagnosis: Type 2 diabetes mellitus w/o complications   Impression: 51 year old female with a history of diabetes, hypercholesterolemia who presents with right-sided facial droop, dysarthria, right-sided weakness and numbness.  Her symptoms markedly improved shortly after tenecteplase.  She did not have any symptoms suggestive of complicated migraine and given her risk factors, my suspicion is that this represents stroke aborted with thrombolytic therapy, though stroke mimic would also be a possibility.  Plan: - HgbA1c, fasting lipid panel - Frequent neuro checks - Prophylactic therapy-Antiplatelet med: None for 24 hours - Risk factor modification - Telemetry monitoring - PT consult, OT consult, Speech consult - Stroke team to follow   Roland Rack, MD [facility] [phone]  If 7pm- 7am, please page neurology on call as listed in [place].

## 2021-11-17 NOTE — Discharge Summary (Addendum)
Stroke Discharge Summary  Patient ID: [name]   MRN: [id number]      DOB: 1970/08/24  Date of Admission: 11/16/2021 Date of Discharge: 11/17/2021  Attending Physician:  No att. providers found, Stroke MD Consultant(s):    None  Patient's PCP:  Kathyrn Drown, MD  DISCHARGE DIAGNOSIS:  Principal Problem:   Stroke like episode likely complicated migraine stated post IV TNK TIA History of migraines   Allergies as of 11/17/2021       Reactions   Omnicef [cefdinir] Other (See Comments)   Patient states antibiotic does not work. Has tried multiple times.   Losartan Rash   Broke out on face only; no allergic reaction        Medication List     TAKE these medications    allopurinol 300 MG tablet Commonly known as: ZYLOPRIM Take 1 tablet (300 mg total) by mouth 2 (two) times daily.   ALPRAZolam 0.5 MG tablet Commonly known as: XANAX Take 1/2-1 tablet (0.25-0.5 mg total) by mouth at bedtime as needed for sleep. *07/24/21   amitriptyline 50 MG tablet Commonly known as: ELAVIL TAKE 1 TABLET BY MOUTH AT BEDTIME What changed: how much to take   aspirin EC 81 MG tablet Take 1 tablet (81 mg total) by mouth daily. Swallow whole.   clopidogrel 75 MG tablet Commonly known as: Plavix Take 1 tablet (75 mg total) by mouth daily.   cyanocobalamin 1000 MCG/ML injection Commonly known as: VITAMIN B12 Inject 1 mL into the muscle every 30 days.   cyclobenzaprine 10 MG tablet Commonly known as: FLEXERIL Take 10 mg by mouth daily as needed for muscle spasms.   famotidine 40 MG tablet Commonly known as: PEPCID Take 1 tablet (40 mg total) by mouth at bedtime.   FreeStyle Libre 3 Sensor Misc Apply as directed every 14 days   FREESTYLE LITE test strip Generic drug: glucose blood Use twice daily as directed to check blood sugar   gabapentin 600 MG tablet Commonly known as: NEURONTIN Take 1 tablet (600 mg total) by mouth every 6 (six) hours as needed for pain. What  changed: when to take this   lidocaine-prilocaine cream Commonly known as: EMLA APPLY TO FEET AS NEEDED FOR NERVE PAIN What changed:  how much to take how to take this when to take this reasons to take this   Lotemax SM 0.38 % Gel Generic drug: Loteprednol Etabonate Place 1 Application into the left eye daily.   magic mouthwash (nystatin, hydrocortisone, diphenhydrAMINE) suspension Swish and spit 5 MLs by mouth  3 times a day as needed What changed:  how much to take how to take this when to take this additional instructions   metFORMIN 1000 MG tablet Commonly known as: GLUCOPHAGE Take 1 tablet (1,000 mg total) by mouth 2 (two) times daily before a meal.   montelukast 10 MG tablet Commonly known as: SINGULAIR TAKE 1 TABLET BY MOUTH ONCE A DAY FOR ALLERGIES What changed:  how much to take how to take this when to take this reasons to take this   Mounjaro 15 MG/0.5ML Pen Generic drug: tirzepatide Inject 15 mg into the skin once a week.   omega-3 acid ethyl esters 1 g capsule Commonly known as: LOVAZA TAKE 2 CAPSULES BY MOUTH TWO TIMES DAILY   pantoprazole 40 MG tablet Commonly known as: PROTONIX Take 1 tablet (40 mg total) by mouth 2 (two) times daily before a meal.   rosuvastatin 10 MG  tablet Commonly known as: CRESTOR Take 1 tablet (10 mg total) by mouth at bedtime.   Tyrvaya 0.03 MG/ACT Soln Generic drug: Varenicline Tartrate PLACE 1 SQUIRT INTRANASALLY 2 TIMES DAILY What changed:  how much to take how to take this when to take this   Vitamin D (Ergocalciferol) 1.25 MG (50000 UNIT) Caps capsule Commonly known as: DRISDOL Take 1 capsule by mouth every 7 days.        LABORATORY STUDIES CBC    Component Value Date/Time   WBC 8.2 11/16/2021 1212   RBC 4.55 11/16/2021 1212   HGB 13.9 11/16/2021 1214   HGB 11.4 05/11/2021 0837   HCT 41.0 11/16/2021 1214   HCT 34.2 05/11/2021 0837   PLT 347 11/16/2021 1212   PLT 341 05/11/2021 0837   MCV 86.4  11/16/2021 1212   MCV 84 05/11/2021 0837   MCH 27.3 11/16/2021 1212   MCHC 31.6 11/16/2021 1212   RDW 14.6 11/16/2021 1212   RDW 14.8 05/11/2021 0837   LYMPHSABS 3.6 11/16/2021 1212   LYMPHSABS 2.6 05/11/2021 0837   MONOABS 0.6 11/16/2021 1212   EOSABS 0.2 11/16/2021 1212   EOSABS 0.2 05/11/2021 0837   BASOSABS 0.1 11/16/2021 1212   BASOSABS 0.1 05/11/2021 0837   CMP    Component Value Date/Time   NA 137 11/16/2021 1214   NA 137 05/11/2021 0837   K 3.8 11/16/2021 1214   CL 95 (L) 11/16/2021 1214   CO2 28 11/16/2021 1212   GLUCOSE 112 (H) 11/16/2021 1214   BUN 8 11/16/2021 1214   BUN 10 05/11/2021 0837   CREATININE 0.70 11/16/2021 1214   CALCIUM 9.2 11/16/2021 1212   PROT 7.3 11/16/2021 1212   PROT 6.9 05/11/2021 0837   ALBUMIN 4.2 11/16/2021 1212   ALBUMIN 4.6 05/11/2021 0837   AST 61 (H) 11/16/2021 1212   ALT 34 11/16/2021 1212   ALKPHOS 69 11/16/2021 1212   BILITOT 0.6 11/16/2021 1212   BILITOT 0.2 05/11/2021 0837   GFRNONAA >60 11/16/2021 1212   GFRAA 122 03/13/2020 0828   COAGS Lab Results  Component Value Date   INR 1.0 11/16/2021   Lipid Panel    Component Value Date/Time   CHOL 103 11/17/2021 0249   CHOL 116 06/30/2021 1529   TRIG 191 (H) 11/17/2021 0249   TRIG 185 (H) 06/30/2021 1529   HDL 27 (L) 11/17/2021 0249   HDL 36 (L) 05/11/2021 0837   CHOLHDL 3.8 11/17/2021 0249   VLDL 38 11/17/2021 0249   LDLCALC 38 11/17/2021 0249   LDLCALC 62 05/11/2021 0837   HgbA1C  Lab Results  Component Value Date   HGBA1C 6.8 (H) 11/16/2021   Urinalysis    Component Value Date/Time   COLORURINE STRAW (A) 11/16/2021 2040   APPEARANCEUR CLEAR 11/16/2021 2040   LABSPEC 1.014 11/16/2021 2040   PHURINE 7.0 11/16/2021 2040   GLUCOSEU NEGATIVE 11/16/2021 2040   HGBUR NEGATIVE 11/16/2021 2040   BILIRUBINUR NEGATIVE 11/16/2021 2040   BILIRUBINUR + 10/12/2013 1117   KETONESUR NEGATIVE 11/16/2021 2040   PROTEINUR NEGATIVE 11/16/2021 2040   UROBILINOGEN 0.2  08/27/2009 0655   NITRITE NEGATIVE 11/16/2021 2040   LEUKOCYTESUR NEGATIVE 11/16/2021 2040   Urine Drug Screen No results found for: "LABOPIA", "COCAINSCRNUR", "LABBENZ", "AMPHETMU", "THCU", "LABBARB"  Alcohol Level    Component Value Date/Time   ETH <10 11/16/2021 1213     SIGNIFICANT DIAGNOSTIC STUDIES CT HEAD WO CONTRAST (5MM)  Result Date: 11/17/2021 CLINICAL DATA:  51 year old female code stroke presentation. Unrevealing CTA  and MRI. EXAM: CT HEAD WITHOUT CONTRAST TECHNIQUE: Contiguous axial images were obtained from the base of the skull through the vertex without intravenous contrast. RADIATION DOSE REDUCTION: This exam was performed according to the departmental dose-optimization program which includes automated exposure control, adjustment of the mA and/or kV according to patient size and/or use of iterative reconstruction technique. COMPARISON:  Brain MRI and head CT 11/16/2021. FINDINGS: Brain: Cerebral volume is within normal limits. No midline shift, ventriculomegaly, mass effect, evidence of mass lesion, intracranial hemorrhage or evidence of cortically based acute infarction. Gray-white matter differentiation is within normal limits throughout the brain. Gray-white matter differentiation remains within normal limits. Vascular: Calcified atherosclerosis at the skull base. No suspicious intracranial vascular hyperdensity. Skull: No acute osseous abnormality identified. Sinuses/Orbits: Visualized paranasal sinuses and mastoids are stable and well aerated. Other: No acute orbit or scalp soft tissue finding. IMPRESSION: Normal noncontrast CT appearance of the brain. Electronically Signed   By: Genevie Ann M.D.   On: 11/17/2021 10:42   ECHOCARDIOGRAM COMPLETE  Result Date: 11/16/2021    ECHOCARDIOGRAM REPORT   Patient Name:   [name] Date of Exam: 11/16/2021 Medical Rec #:  [id number]    Height:       65.0 in Accession #:    [id number]   Weight:       181.0 lb Date of Birth:  Sep 12, 1970     BSA:          1.896 m Patient Age:    30 years     BP:           115/86 mmHg Patient Gender: F            HR:           111 bpm. Exam Location:  [facility] Procedure: 2D Echo, Cardiac Doppler and Color Doppler Indications:    Stroke  History:        Patient has no prior history of Echocardiogram examinations.                 Stroke; Risk Factors:Hypertension, Diabetes and Dyslipidemia.  Sonographer:    Wenda Low Referring Phys: [id number] [place]  1. Left ventricular ejection fraction, by estimation, is 65 to 70%. The left ventricle has normal function. The left ventricle has no regional wall motion abnormalities. There is mild asymmetric left ventricular hypertrophy of the basal segment. Left ventricular diastolic parameters are indeterminate.  2. Right ventricular systolic function is normal. The right ventricular size is normal. There is normal pulmonary artery systolic pressure. The estimated right ventricular systolic pressure is 20.9 mmHg.  3. The mitral valve is grossly normal. Trivial mitral valve regurgitation.  4. The aortic valve is tricuspid. Aortic valve regurgitation is not visualized. Aortic valve mean gradient measures 5.0 mmHg.  5. The inferior vena cava is normal in size with greater than 50% respiratory variability, suggesting right atrial pressure of 3 mmHg. Comparison(s): No prior Echocardiogram. FINDINGS  Left Ventricle: Left ventricular ejection fraction, by estimation, is 65 to 70%. The left ventricle has normal function. The left ventricle has no regional wall motion abnormalities. The left ventricular internal cavity size was normal in size. There is  mild asymmetric left ventricular hypertrophy of the basal segment. Left ventricular diastolic parameters are indeterminate. Right Ventricle: The right ventricular size is normal. No increase in right ventricular wall thickness. Right ventricular systolic function is normal. There is normal pulmonary artery systolic  pressure. The tricuspid regurgitant velocity is 2.22 m/s, and  with an assumed right atrial pressure of 3 mmHg, the estimated right ventricular systolic pressure is 09.4 mmHg. Left Atrium: Left atrial size was normal in size. Right Atrium: Right atrial size was normal in size. Pericardium: There is no evidence of pericardial effusion. Presence of epicardial fat layer. Mitral Valve: The mitral valve is grossly normal. Trivial mitral valve regurgitation. MV peak gradient, 6.6 mmHg. The mean mitral valve gradient is 3.0 mmHg. Tricuspid Valve: The tricuspid valve is grossly normal. Tricuspid valve regurgitation is trivial. Aortic Valve: The aortic valve is tricuspid. There is mild aortic valve annular calcification. Aortic valve regurgitation is not visualized. Aortic valve mean gradient measures 5.0 mmHg. Aortic valve peak gradient measures 9.5 mmHg. Aortic valve area, by  VTI measures 2.00 cm. Pulmonic Valve: The pulmonic valve was grossly normal. Pulmonic valve regurgitation is trivial. Aorta: The aortic root is normal in size and structure. Venous: The inferior vena cava is normal in size with greater than 50% respiratory variability, suggesting right atrial pressure of 3 mmHg. IAS/Shunts: No atrial level shunt detected by color flow Doppler.  LEFT VENTRICLE PLAX 2D LVIDd:         3.20 cm   Diastology LVIDs:         2.00 cm   LV e' lateral:   9.90 cm/s LV PW:         1.20 cm   LV E/e' lateral: 7.9 LV IVS:        1.00 cm LVOT diam:     1.90 cm LV SV:         59 LV SV Index:   31 LVOT Area:     2.84 cm  RIGHT VENTRICLE RV Basal diam:  2.80 cm RV Mid diam:    3.10 cm RV S prime:     19.20 cm/s TAPSE (M-mode): 2.1 cm LEFT ATRIUM           Index        RIGHT ATRIUM           Index LA diam:      3.40 cm 1.79 cm/m   RA Area:     11.70 cm LA Vol (A2C): 41.6 ml 21.94 ml/m  RA Volume:   24.40 ml  12.87 ml/m LA Vol (A4C): 45.6 ml 24.05 ml/m  AORTIC VALVE                     PULMONIC VALVE AV Area (Vmax):    2.19 cm       PV Vmax:       1.06 m/s AV Area (Vmean):   2.05 cm      PV Peak grad:  4.5 mmHg AV Area (VTI):     2.00 cm AV Vmax:           154.00 cm/s AV Vmean:          108.000 cm/s AV VTI:            0.294 m AV Peak Grad:      9.5 mmHg AV Mean Grad:      5.0 mmHg LVOT Vmax:         119.00 cm/s LVOT Vmean:        78.150 cm/s LVOT VTI:          0.207 m LVOT/AV VTI ratio: 0.70  AORTA Ao Root diam: 2.80 cm Ao Asc diam:  3.30 cm MITRAL VALVE  TRICUSPID VALVE MV Area (PHT): 5.54 cm     TR Peak grad:   19.7 mmHg MV Area VTI:   3.11 cm     TR Vmax:        222.00 cm/s MV Peak grad:  6.6 mmHg MV Mean grad:  3.0 mmHg     SHUNTS MV Vmax:       1.28 m/s     Systemic VTI:  0.21 m MV Vmean:      76.1 cm/s    Systemic Diam: 1.90 cm MV Decel Time: 137 msec MV E velocity: 78.40 cm/s MV A velocity: 117.00 cm/s MV E/A ratio:  0.67 Rozann Lesches MD Electronically signed by Rozann Lesches MD Signature Date/Time: 11/16/2021/4:49:20 PM    Final    MR BRAIN WO CONTRAST  Result Date: 11/16/2021 CLINICAL DATA:  Provided history: Neuro deficit, acute, stroke suspected. EXAM: MRI HEAD WITHOUT CONTRAST TECHNIQUE: Multiplanar, multiecho pulse sequences of the brain and surrounding structures were obtained without intravenous contrast. COMPARISON:  Non-contrast head CT 11/16/2021. CT angiogram head/neck 11/16/2021. FINDINGS: Brain: Cerebral volume is normal. No cortical encephalomalacia is identified. No significant cerebral white matter disease for age. There is no acute infarct. No evidence of an intracranial mass. No chronic intracranial blood products. No extra-axial fluid collection. No midline shift. Vascular: Maintained flow voids within the proximal large arterial vessels. Skull and upper cervical spine: No focal suspicious marrow lesion. Sinuses/Orbits: No mass or acute finding within the imaged orbits. No significant paranasal sinus disease. IMPRESSION: Unremarkable non-contrast MRI appearance of the brain for age. No  evidence of acute intracranial abnormality. Electronically Signed   By: Kellie Simmering D.O.   On: 11/16/2021 15:30   CT ANGIO HEAD NECK W WO CM (CODE STROKE)  Result Date: 11/16/2021 CLINICAL DATA:  Follow up Stroke Code EXAM: CT ANGIOGRAPHY HEAD AND NECK TECHNIQUE: Multidetector CT imaging of the head and neck was performed using the standard protocol during bolus administration of intravenous contrast. Multiplanar CT image reconstructions and MIPs were obtained to evaluate the vascular anatomy. Carotid stenosis measurements (when applicable) are obtained utilizing NASCET criteria, using the distal internal carotid diameter as the denominator. RADIATION DOSE REDUCTION: This exam was performed according to the departmental dose-optimization program which includes automated exposure control, adjustment of the mA and/or kV according to patient size and/or use of iterative reconstruction technique. CONTRAST:  74m OMNIPAQUE IOHEXOL 350 MG/ML SOLN COMPARISON:  None Available. FINDINGS: CT HEAD FINDINGS Brain: No evidence of acute infarction, hemorrhage, hydrocephalus, extra-axial collection or mass lesion/mass effect. Vascular: No hyperdense vessel or unexpected calcification. Skull: Normal. Negative for fracture or focal lesion. Sinuses/Orbits: No acute finding. Other: None. Review of the MIP images confirms the above findings CTA NECK FINDINGS Aortic arch: 2 vessel arch. No significant stenosis of the branch vessels. Right carotid system: No evidence of dissection, stenosis (50% or greater), or occlusion. Left carotid system: No evidence of dissection, stenosis (50% or greater), or occlusion. Vertebral arteries: Codominant. No evidence of dissection, stenosis (50% or greater), or occlusion. Skeleton: Negative. Other neck: Negative. Upper chest: Negative. Review of the MIP images confirms the above findings CTA HEAD FINDINGS Anterior circulation: No significant stenosis, proximal occlusion, aneurysm, or vascular  malformation. Posterior circulation: No significant stenosis, proximal occlusion, aneurysm, or vascular malformation. Venous sinuses: As permitted by contrast timing, patent. Anatomic variants: None Review of the MIP images confirms the above findings IMPRESSION: No intracranial large vessel occlusion or hemodynamically significant stenosis in the neck. Electronically Signed   By: HMadison Hickman  Shearon Stalls M.D.   On: 11/16/2021 13:13   CT HEAD CODE STROKE WO CONTRAST  Result Date: 11/16/2021 CLINICAL DATA:  Code stroke.  Aphasia and agraphia. EXAM: CT HEAD WITHOUT CONTRAST TECHNIQUE: Contiguous axial images were obtained from the base of the skull through the vertex without intravenous contrast. RADIATION DOSE REDUCTION: This exam was performed according to the departmental dose-optimization program which includes automated exposure control, adjustment of the mA and/or kV according to patient size and/or use of iterative reconstruction technique. COMPARISON:  None Available. FINDINGS: Brain: No definite evidence of acute infarction, hemorrhage, hydrocephalus, extra-axial collection or mass lesion/mass effect. There is a subtle asymmetric hypodensity in the medial aspect of the left temporal lobe (series 2, image 14) Vascular: No hyperdense vessel or unexpected calcification. Skull: Normal. Negative for fracture or focal lesion. Sinuses/Orbits: No acute finding. Other: None. ASPECTS [facility] Stroke Program Early CT Score) Total score (0-10 with 10 being normal): 10 IMPRESSION: 1. No evidence of hemorrhage or CT evidence of an acute infarct. There is a subtle asymmetric hypodensity in the posterior left temporal lobe, which may be artifactual. 2. ASPECTS is 10 Discussed with Dr. Matilde Sprang on 11/16/21 at 12:19 PM via telephone Electronically Signed   By: Marin Roberts M.D.   On: 11/16/2021 12:27      HISTORY OF PRESENT ILLNESS  51 year old female with a history of diabetes, hypercholesterolemia who presents with right-sided  facial droop, dysarthria, right-sided weakness and numbness. She was at work and began dropping things out of her right hand, and having some abnormal speech.  She also had some numbness, but no tingling.  She denies any positive visual symptoms or positive sensory symptoms.  She went to the emergency department at [facility] around noon where code stroke was called and she had an NIHSS of five.  She was given tenecteplase by Dr. Quinn Axe.   She reports that relatively soon after receiving thrombolytics, she had significant improvement.    HOSPITAL COURSE Patient evaluated at bedside, husband present.  Continues to endorse headache, rated 7/10, describes it as explosive pressure at the dome of her head. Has hx of these headaches accompanied by photophobia. Says she has been prescribed migraine medications in past; headaches also improve with OTC medications. Denies smoking, OSA, coronary artery disease. Reports she is tachycardic at baseline, HR was 110 on monitor. Discussed imaging findings with patient, counseled on medication adherence to DAPT therapy.   TIA vs Migraine vs Conversion Reaction Etiology:  Unclear etiology, patient with multiple stroke risk factor w/ full resolution of symptoms and negative findings on imaging.   Status Post TNK. Code Stroke CT head No acute abnormality. ASPECTS 10.    CTA head & neck: No acute findings MRI : no acute findings 2D Echo: LVEF 65-70% LDL 38 HgbA1c 6.8 No antithrombotic prior to admission, now on aspirin 81 mg daily and clopidogrel 75 mg daily for 3 weeks, then ASA 81 alone. Therapy recommendations:  No PT/OT follow up Disposition:  Discharge home  Hypertension Normotensive now Continue home medications  Hyperlipidemia Home meds:  Crestor 10 mg LDL 38, goal < 70 Continue statin at discharge  Diabetes type II Controlled Home meds:  Metformin, Mounjaro HgbA1c 6.8, goal < 7.0 CBGs Recent Labs    11/17/21 0324 11/17/21 0757 11/17/21 1138   GLUCAP 140* 119* 123*    SSI   Other Stroke Risk Factors Obesity, Body mass index is 30.12 kg/m., BMI >/= 30 associated with increased stroke risk, recommend weight loss, diet and exercise  as appropriate  Migraines Hyperlipidemia Diabetes   GERD Continue Protonix   Physical Exam was unremarkable on day of discharge, no acute neurological findings noted, speech is normal. DISCHARGE EXAM Blood pressure (!) 121/95, pulse (!) 124, temperature 98 F (36.7 C), temperature source Oral, resp. rate (!) 23, height '5\' 5"'$  (1.651 m), weight 82.1 kg, SpO2 94 %. --PHYSICAL EXAM-- Constitutional: No acute distress HEENT: Normocephalic, Normal conjunctiva, anicteric. Hearing grossly intact. No nasal discharge. Neck: Neck is supple. No masses or thyromegaly. Resp: Respirations are non-labored. No wheezing. Skin: Warm. No rashes or ulcers.  Neuro Mental Status AAOx3; speech fluent; language and comprehension intact; long term memory intact; concentration and attention intact  Cranial Nerves II: Nor abnormalities in visual fields. III, IV, VI: EOM intact, no gaze preference or deviation, no nystagmus  V: Normal sensation in V1, V2, and V3 segments bilaterally  VII: Facial symmetry at rest and during various facial expressions VIII: Normal hearing to speech  IX, X: Normal palatal elevation, no uvular deviation  XI: 5/5 head turn and 5/5 shoulder shrug bilaterally XII: Symmetric tongue movement, tongue is midline without atrophy or fasciculations.   Motor Strength R UE: 5/5 L UE: 5/5 R LE: 5/5 L LE: 5/5  Sensory  Intact sensation of UE and LE bilaterally.   Cerebellar: FNF: WNL Heel-to-Shin Test: WNL   Discharge Diet       Diet   Diet Carb Modified Fluid consistency: Thin; Room service appropriate? Yes   liquids  DISCHARGE PLAN Disposition:  Discharge home aspirin 81 mg daily and clopidogrel 75 mg daily for secondary stroke prevention for 3 weeks then aspirin  alone. Ongoing stroke risk factor control by Primary Care Physician at time of discharge Follow-up PCP Luking, Elayne Snare, MD in 2 weeks. Follow-up in [facility] in 8 weeks, office to schedule an appointment.   35 minutes were spent preparing discharge.  Christene Slates, MD PGY-1   I have personally obtained history,examined this patient, reviewed notes, independently viewed imaging studies, participated in medical decision making and plan of care.ROS completed by me personally and pertinent positives fully documented  I have made any additions or clarifications directly to the above note. Agree with note above.    Antony Contras, MD Medical Director [facility] Pager: [phone] 11/17/2021 5:45 PM

## 2021-11-17 NOTE — Evaluation (Signed)
Speech Language Pathology Evaluation Patient Details Name: [name] MRN: [id number] DOB: [birth year] Today's Date: 11/17/2021 Time: 3818-2993 SLP Time Calculation (min) (ACUTE ONLY): 12 min  Problem List:  Patient Active Problem List   Diagnosis Date Noted   Stroke determined by clinical assessment ([place]) 11/16/2021   Respiratory infection 08/03/2021   Chronic idiopathic gout involving toe of left foot without tophus 09/06/2020   Migraine without aura and without status migrainosus, not intractable 04/04/2020   Seasonal allergic rhinitis 03/03/2020   Uncontrolled type 2 diabetes mellitus with hyperglycemia ([place]) 08/06/2019   Type 2 diabetes mellitus with diabetic polyneuropathy, without long-term current use of insulin ([place]) 02/03/2019   Mixed hyperlipidemia 02/03/2019   B12 deficiency 12/18/2018   NAFL (nonalcoholic fatty liver) 71/69/6789   Elevated liver enzymes 09/22/2018   Chronic insomnia 08/19/2018   Central obesity 08/19/2018   Peripheral polyneuropathy 09/20/2017   Acne rosacea 11/20/2016   Hypertension not at goal 03/26/2016   Vitamin D deficiency 10/29/2014   Class 1 obesity due to excess calories with serious comorbidity and body mass index (BMI) of 33.0 to 33.9 in adult 04/05/2014   Esophageal reflux 10/13/2013   Irritable bowel syndrome with constipation 06/25/2013   Anxiety 02/14/2013   Knee pain, chronic 02/14/2013   Past Medical History:  Past Medical History:  Diagnosis Date   Allergy    Anxiety    Diabetes mellitus without complication ([place])    Gout    History of migraine headaches    Hx of migraines    IBS (irritable bowel syndrome)    Impaired fasting glucose    Past Surgical History:  Past Surgical History:  Procedure Laterality Date   ABDOMINAL HYSTERECTOMY  2005   partial   BLADDER SUSPENSION  2012   BREAST REDUCTION SURGERY  2002   CHOLECYSTECTOMY  2005   COLONOSCOPY  07/10/2021   EYE SURGERY  1999   laser    UPPER GASTROINTESTINAL  ENDOSCOPY     20 years ago by Dr. Gala Romney. - told she had ulcers   HPI:  [name] is a 51 y.o. female admitted with right sided weakness, code stroke called, TNK administered. MRI negative. PMH: migraines, DM   Assessment / Plan / Recommendation Clinical Impression  Pt's oromotor exam was unremarkable and she scored within normal range on SLUMS ([place] Mental Status examination) of 27/30. Recalled 4/5 objects independently and lost 2 points on paragraph recall of information. 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 Speech is clear and intelligible and no language deficits were present. ST not needed at this time and pt/husband are in agreement. She is eager for PT/OT to be able to ambulate to restroom.    SLP Assessment  SLP Recommendation/Assessment: Patient does not need any further Speech [place] [facility] SLP Visit Diagnosis: Cognitive communication deficit (R41.841)    Recommendations for follow up therapy are one component of a multi-disciplinary discharge planning process, led by the attending physician.  Recommendations may be updated based on patient status, additional functional criteria and insurance authorization.    Follow Up Recommendations  No SLP follow up    Assistance Recommended at Discharge  None  Functional Status Assessment    Frequency and Duration           SLP Evaluation Cognition  Overall Cognitive Status: Within Functional Limits for tasks assessed Arousal/Alertness: Awake/alert Orientation Level: Oriented X4 Year: 2023 Day of Week: Correct Attention: Sustained Sustained Attention: Appears intact Memory:  Impaired (4/5 spontaneous, 1 correct with cue) Memory Impairment: Retrieval deficit Awareness: Appears intact Problem Solving: Appears intact Safety/Judgment: Appears intact       Comprehension  Auditory Comprehension Overall Auditory Comprehension: Appears within functional limits for tasks  assessed Visual Recognition/Discrimination Discrimination: Not tested Reading Comprehension Reading Status: Not tested    Expression Expression Primary Mode of Expression: Verbal Verbal Expression Overall Verbal Expression: Appears within functional limits for tasks assessed Naming: No impairment Pragmatics: No impairment Written Expression Dominant Hand: Right Written Expression: Not tested   Oral / Motor  Oral Motor/Sensory Function Overall Oral Motor/Sensory Function: Within functional limits Motor Speech Overall Motor Speech: Appears within functional limits for tasks assessed Motor Planning: Witnin functional limits            Houston Siren 11/17/2021, 10:05 AM

## 2021-11-17 NOTE — Progress Notes (Signed)
OT Cancellation Note  Patient Details Name: FILIPPA YARBOUGH MRN: [id number] DOB: 03-17-1970   Cancelled Treatment:    Reason Eval/Treat Not Completed: Patient not medically ready (active bedrest) OT order received and appreciated however this conflicts with current bedrest order set. Please increase activity tolerance as appropriate and remove bedrest from orders. . Please contact OT at [phone] if bed rest order is discontinued. OT will hold evaluation at this time and will check back as time allows pending increased activity orders.   Jeri Modena 11/17/2021, 7:11 AM

## 2021-11-17 NOTE — Evaluation (Addendum)
Physical Therapy Evaluation Patient Details Name: [name] MRN: [id number] DOB: March 27, 1970 Today's Date: 11/17/2021  History of Present Illness  Patient is a 51 y/o female who presents on 11/16/21 with slurred speech, HA and right sided weakness. s/p TNK. NIH:5. Head CT and MRI-unremarkable. PMH includes migraines, DM, anxiety.  Clinical Impression  Patient reports feeling close to baseline and reports no symptoms or deficits this morning. Tolerated transfers and ambulation with mod I for safety and scored 23/24 on DGI indicating pt is not a fall risk.  HR ranging from 115-134 bpm with activity. Does report hx of neuropathy in her toes at baseline. Pt is independent PTA and works for [employer] at [employer]. Aware of signs/symptoms of stroke. Does not require skilled therapy services as pt functioning at Mod I level. Has support of spouse at home. All education completed. Discharge from therapy.       Recommendations for follow up therapy are one component of a multi-disciplinary discharge planning process, led by the attending physician.  Recommendations may be updated based on patient status, additional functional criteria and insurance authorization.  Follow Up Recommendations No PT follow up      Assistance Recommended at Discharge None  Patient can return home with the following       Equipment Recommendations None recommended by PT  Recommendations for Other Services       Functional Status Assessment Patient has not had a recent decline in their functional status     Precautions / Restrictions Precautions Precautions: Other (comment) Precaution Comments: watch HR Restrictions Weight Bearing Restrictions: No      Mobility  Bed Mobility Overal bed mobility: Modified Independent             General bed mobility comments: No assist needed.    Transfers Overall transfer level: Modified independent                 General transfer comment: Stood from EOB x1  without difficulty.    Ambulation/Gait Ambulation/Gait assistance: Modified independent (Device/Increase time) Gait Distance (Feet): 500 Feet Assistive device: None Gait Pattern/deviations: WFL(Within Functional Limits)   Gait velocity interpretation: >2.62 ft/sec, indicative of community ambulatory   General Gait Details: SLow, steady gait.  Stairs            Wheelchair Mobility    Modified Rankin (Stroke Patients Only) Modified Rankin (Stroke Patients Only) Pre-Morbid Rankin Score: No symptoms Modified Rankin: No significant disability     Balance                                 Standardized Balance Assessment Standardized Balance Assessment : Dynamic Gait Index   Dynamic Gait Index Level Surface: Normal Change in Gait Speed: Normal Gait with Horizontal Head Turns: Normal Gait with Vertical Head Turns: Normal Gait and Pivot Turn: Normal Step Over Obstacle: Normal Step Around Obstacles: Normal Steps: Mild Impairment Total Score: 23       Pertinent Vitals/Pain Pain Assessment Pain Assessment: No/denies pain    Home Living Family/patient expects to be discharged to:: Private residence Living Arrangements: Spouse/significant other Available Help at Discharge: Family;Available PRN/intermittently Type of Home: House Home Access: Stairs to enter   Entrance Stairs-Number of Steps: 1 step   Home Layout: One level Home Equipment: None      Prior Function Prior Level of Function : Independent/Modified Independent  Mobility Comments: independent, drives, works for [employer] ADLs Comments: independent     Hand Dominance   Dominant Hand: Right    Extremity/Trunk Assessment   Upper Extremity Assessment Upper Extremity Assessment: Defer to OT evaluation    Lower Extremity Assessment Lower Extremity Assessment: Overall WFL for tasks assessed (hx of neuropathy in toes)    Cervical / Trunk Assessment Cervical /  Trunk Assessment: Normal  Communication   Communication: No difficulties  Cognition Arousal/Alertness: Awake/alert Behavior During Therapy: WFL for tasks assessed/performed Overall Cognitive Status: Within Functional Limits for tasks assessed                                          General Comments General comments (skin integrity, edema, etc.): Spouse present during session. HR ranging from 115-134 bpm with activity.    Exercises     Assessment/Plan    PT Assessment Patient does not need any further PT services  PT Problem List         PT Treatment Interventions      PT Goals (Current goals can be found in the Care Plan section)  Acute Rehab PT Goals Patient Stated Goal: to go home PT Goal Formulation: All assessment and education complete, DC therapy    Frequency       Co-evaluation               AM-PAC PT "6 Clicks" Mobility  Outcome Measure Help needed turning from your back to your side while in a flat bed without using bedrails?: None Help needed moving from lying on your back to sitting on the side of a flat bed without using bedrails?: None Help needed moving to and from a bed to a chair (including a wheelchair)?: None Help needed standing up from a chair using your arms (e.g., wheelchair or bedside chair)?: None Help needed to walk in hospital room?: None Help needed climbing 3-5 steps with a railing? : None 6 Click Score: 24    End of Session   Activity Tolerance: Patient tolerated treatment well Patient left: in bed;with call bell/phone within reach;with family/visitor present Nurse Communication: Mobility status PT Visit Diagnosis: Difficulty in walking, not elsewhere classified (R26.2)    Time: 1130-1145 PT Time Calculation (min) (ACUTE ONLY): 15 min   Charges:   PT Evaluation $PT Eval Moderate Complexity: 1 Mod          Marisa Severin, PT, DPT Acute Rehabilitation Services Secure chat preferred Office  [phone]     [name] 11/17/2021, 11:56 AM

## 2021-11-17 NOTE — Evaluation (Signed)
Occupational Therapy Evaluation Patient Details Name: [name] MRN: [id number] DOB: 1970/05/08 Today's Date: 11/17/2021   History of Present Illness Patient is a 51 y/o female who presents on 11/16/21 with slurred speech, HA and right sided weakness. s/p TNK. NIH:5. Head CT and MRI-unremarkable. PMH includes migraines, DM, anxiety.   Clinical Impression   Patient evaluated by Occupational Therapy with no further acute OT needs identified. All education has been completed and the patient has no further questions. See below for any follow-up Occupational Therapy or equipment needs. OT to sign off. Thank you for referral.        Recommendations for follow up therapy are one component of a multi-disciplinary discharge planning process, led by the attending physician.  Recommendations may be updated based on patient status, additional functional criteria and insurance authorization.   Follow Up Recommendations  No OT follow up    Assistance Recommended at Discharge None  Patient can return home with the following      Functional Status Assessment  Patient has had a recent decline in their functional status and demonstrates the ability to make significant improvements in function in a reasonable and predictable amount of time.  Equipment Recommendations  None recommended by OT    Recommendations for Other Services       Precautions / Restrictions Precautions Precautions: Other (comment) Precaution Comments: watch HR Restrictions Weight Bearing Restrictions: No      Mobility Bed Mobility Overal bed mobility: Modified Independent             General bed mobility comments: No assist needed.    Transfers Overall transfer level: Modified independent                        Balance Overall balance assessment: No apparent balance deficits (not formally assessed)                                         ADL either performed or assessed with  clinical judgement   ADL Overall ADL's : Modified independent                                       General ADL Comments: demonstrated tub transfer simulate. pt demonstrates bathroom level transfer. pt able to occlude vision and turn to simulate shower hair washing     Vision Baseline Vision/History: 1 Wears glasses Ability to See in Adequate Light: 0 Adequate Vision Assessment?: Yes Eye Alignment: Within Functional Limits Ocular Range of Motion: Within Functional Limits Alignment/Gaze Preference: Within Defined Limits     Perception     Praxis      Pertinent Vitals/Pain Pain Assessment Pain Assessment: No/denies pain     Hand Dominance Right   Extremity/Trunk Assessment Upper Extremity Assessment Upper Extremity Assessment: Overall WFL for tasks assessed   Lower Extremity Assessment Lower Extremity Assessment: Defer to PT evaluation   Cervical / Trunk Assessment Cervical / Trunk Assessment: Normal   Communication Communication Communication: No difficulties   Cognition Arousal/Alertness: Awake/alert Behavior During Therapy: WFL for tasks assessed/performed Overall Cognitive Status: Within Functional Limits for tasks assessed  General Comments  spouse present throughout. pt with sustained HR 120. pt states thats normal for me. My doctor told me to throw my apple watch away that its normal for me. Pt with HR 120-134 during transfers. pt sustained at rest HR120    Exercises     Shoulder Instructions      Home Living Family/patient expects to be discharged to:: Private residence Living Arrangements: Spouse/significant other Available Help at Discharge: Family;Available PRN/intermittently Type of Home: House Home Access: Stairs to enter Entrance Stairs-Number of Steps: 1 step   Home Layout: One level     Bathroom Shower/Tub: Teacher, early years/pre: Handicapped height      Home Equipment: None   Additional Comments: works at vascular as Network engineer. has 2 dogs in the home. Spouse that works but can help      Prior Functioning/Environment Prior Level of Function : Independent/Modified Independent             Mobility Comments: independent, drives, works for [employer] ADLs Comments: independent        OT Problem List:        OT Treatment/Interventions:      OT Goals(Current goals can be found in the care plan section) Acute Rehab OT Goals Patient Stated Goal: to rest and return to work next week OT Goal Formulation: With patient  OT Frequency:      Co-evaluation              AM-PAC OT "6 Clicks" Daily Activity     Outcome Measure Help from another person eating meals?: None Help from another person taking care of personal grooming?: None Help from another person toileting, which includes using toliet, bedpan, or urinal?: None Help from another person bathing (including washing, rinsing, drying)?: None Help from another person to put on and taking off regular upper body clothing?: None Help from another person to put on and taking off regular lower body clothing?: None 6 Click Score: 24   End of Session Equipment Utilized During Treatment: Gait belt Nurse Communication: Mobility status;Precautions  Activity Tolerance: Patient tolerated treatment well Patient left: in bed;with call bell/phone within reach;with chair alarm set;with family/visitor present  OT Visit Diagnosis: Unsteadiness on feet (R26.81)                Time: 0867-6195 OT Time Calculation (min): 19 min Charges:  OT General Charges $OT Visit: 1 Visit OT Evaluation $OT Eval Low Complexity: 1 Low   Brynn, OTR/L  Acute Rehabilitation Services Office: [phone] .   Jeri Modena 11/17/2021, 2:43 PM

## 2021-11-17 NOTE — Progress Notes (Signed)
PT Cancellation Note  Patient Details Name: STARLETTE THUROW MRN: [id number] DOB: 05/30/1970   Cancelled Treatment:    Reason Eval/Treat Not Completed: Active bedrest order Pt with strict bedrest. Will await increase in activity orders prior to PT evaluation.   Marguarite Arbour A Keiandre Cygan 11/17/2021, 6:50 AM Marisa Severin, PT, DPT Acute Rehabilitation Services Secure chat preferred Office [phone]

## 2021-11-19 DIAGNOSIS — R490 Dysphonia: Secondary | ICD-10-CM | POA: Diagnosis not present

## 2021-11-19 DIAGNOSIS — H9313 Tinnitus, bilateral: Secondary | ICD-10-CM | POA: Insufficient documentation

## 2021-11-19 DIAGNOSIS — K145 Plicated tongue: Secondary | ICD-10-CM | POA: Insufficient documentation

## 2021-11-20 ENCOUNTER — Encounter: Payer: Self-pay | Admitting: Nurse Practitioner

## 2021-11-20 NOTE — Telephone Encounter (Signed)
Nurses-please forward to [place] thank you

## 2021-11-25 ENCOUNTER — Telehealth: Payer: Self-pay

## 2021-11-25 NOTE — Telephone Encounter (Signed)
Caller name:Edwina Glennon Hamilton   On DPR? :Yes  Call back number:[phone]  Provider they see: Luking   Reason for call:Rmoni message me she was going to get her flu shot tomorrow but since she had her stroke she wants to make sure it is ok to get the flu shot tomorrow?

## 2021-11-25 NOTE — Telephone Encounter (Signed)
I think it would be fine to go ahead with flu vaccine I do not feel that that will cause any type of secondary issues.  The way the flu vaccine is manufactured is the same way its been manufactured for many years and is safe from a neurologic standpoint regarding strokes.  It is not associated with hypercoagulability the way that COVID potentially can be

## 2021-11-25 NOTE — Telephone Encounter (Signed)
Patient advised of provider's recommendations and verbalized understanding.

## 2021-11-25 NOTE — Telephone Encounter (Signed)
Please advise. Thank you

## 2021-11-27 ENCOUNTER — Other Ambulatory Visit (HOSPITAL_COMMUNITY): Payer: Self-pay

## 2021-11-27 ENCOUNTER — Ambulatory Visit: Payer: 59 | Admitting: Nurse Practitioner

## 2021-11-27 VITALS — BP 107/75 | HR 114 | Temp 97.2°F | Ht 65.0 in | Wt 178.0 lb

## 2021-11-27 DIAGNOSIS — I639 Cerebral infarction, unspecified: Secondary | ICD-10-CM | POA: Diagnosis not present

## 2021-11-27 DIAGNOSIS — R350 Frequency of micturition: Secondary | ICD-10-CM | POA: Diagnosis not present

## 2021-11-27 DIAGNOSIS — N898 Other specified noninflammatory disorders of vagina: Secondary | ICD-10-CM

## 2021-11-27 DIAGNOSIS — F419 Anxiety disorder, unspecified: Secondary | ICD-10-CM

## 2021-11-27 DIAGNOSIS — R300 Dysuria: Secondary | ICD-10-CM

## 2021-11-27 LAB — POCT URINALYSIS DIP (MANUAL ENTRY)
Bilirubin, UA: NEGATIVE
Glucose, UA: NEGATIVE mg/dL
Ketones, POC UA: NEGATIVE mg/dL
Nitrite, UA: POSITIVE — AB
Protein Ur, POC: NEGATIVE mg/dL
Spec Grav, UA: 1.02 (ref 1.010–1.025)
Urobilinogen, UA: 0.2 E.U./dL
pH, UA: 6 (ref 5.0–8.0)

## 2021-11-27 MED ORDER — CEPHALEXIN 500 MG PO CAPS: 500.0000 mg | ORAL_CAPSULE | Freq: Three times a day (TID) | ORAL | 0 refills | Status: DC

## 2021-11-27 MED ORDER — AMITRIPTYLINE HCL 50 MG PO TABS
ORAL_TABLET | Freq: Every day | ORAL | 0 refills | Status: DC
  Filled 2021-11-27 – 2021-12-04 (×2): qty 90, 90d supply, fill #0

## 2021-11-27 MED ORDER — GABAPENTIN 600 MG PO TABS
600.0000 mg | ORAL_TABLET | Freq: Four times a day (QID) | ORAL | 2 refills | Status: DC | PRN
  Filled 2021-11-27 – 2021-12-16 (×2): qty 360, 90d supply, fill #0

## 2021-11-27 MED ORDER — FLUCONAZOLE 150 MG PO TABS
150.0000 mg | ORAL_TABLET | Freq: Every day | ORAL | 0 refills | Status: DC | PRN
  Filled 2021-11-27: qty 2, fill #0

## 2021-11-27 MED ORDER — ALPRAZOLAM 0.5 MG PO TABS
0.2500 mg | ORAL_TABLET | Freq: Every evening | ORAL | 1 refills | Status: DC | PRN
  Filled 2021-11-27 – 2021-12-20 (×2): qty 45, 45d supply, fill #0
  Filled 2022-01-22 – 2022-01-27 (×2): qty 45, 45d supply, fill #1

## 2021-11-27 MED ORDER — METFORMIN HCL 1000 MG PO TABS
1000.0000 mg | ORAL_TABLET | Freq: Two times a day (BID) | ORAL | 1 refills | Status: DC
  Filled 2021-11-27 – 2022-01-19 (×2): qty 180, 90d supply, fill #0

## 2021-11-27 NOTE — Progress Notes (Signed)
Subjective:    Patient ID: [name], female    DOB: 07-Sep-1970, 51 y.o.   MRN: [id number]  HPI  Follow up for stroke from 11/18/21 Had right sided weakness , currently feels very tired on Plavix Discuss time off work  Some nausea and no appetite Vaginal itching and urinary frequency Presents for follow-up for possible stroke see note 11/16/2021.  Patient was admitted to the hospital for strokelike symptoms.  At this time she states all of her symptoms have resolved except for a tight headache in the parietal area bilaterally.  This improves if she massages or rubs the area.  Has had a recent eye exam, is picking up her new glasses today.  Was prescribed Plavix for 3 weeks.  Does not have a follow-up appoint with neurology at this time. Experiencing extreme fatigue.  Having difficulty working full-time, has gone back to work.  Blood sugars at home running 100, at the high as 157.  Adherent to medication regimen.  Takes a rare Xanax but has been under more stress lately, requesting a few more per month during the day if she needs it.  Takes it occasionally for sleep, not every night.  Has been experiencing pain with urination with occasional urgency.  Frequency and a sensation of not emptying her bladder.  Patient states that in multiple bladder scans and in and out caths during her hospitalization which she thinks may have contributed to this.  Is concerned about possible infection.  In addition over the past few days has developed vaginal itching.  Married, same sexual partner.  No fevers.  Review of Systems  Constitutional:  Positive for fatigue. Negative for fever.  HENT:  Negative for trouble swallowing.   Respiratory:  Negative for shortness of breath.   Cardiovascular:  Negative for chest pain, palpitations and leg swelling.  Genitourinary:  Positive for difficulty urinating, dysuria, frequency and urgency. Negative for flank pain.  Neurological:  Positive for headaches. Negative for  syncope, speech difficulty, weakness and numbness.  Psychiatric/Behavioral:  The patient is nervous/anxious.         Objective:   Physical Exam NAD.  Alert, oriented.  Fatigued in appearance.  Making good eye contact.  Dressed appropriately for the weather.  Speech clear.  Thoughts logical coherent and relevant.  Facial movements symmetrical.  Pupils equal and reactive to light.  EOMs intact without nystagmus.  Point-to-point localization normal limit.  Hand strength 5+ bilaterally.  Reflexes normal limit upper and lower extremities.  Gait normal.  Lungs clear.  No CVA tenderness.  Heart regular rate rhythm.  Abdomen soft nondistended with mild suprapubic area tenderness. Today's Vitals   11/27/21 1349  BP: 107/75  Pulse: (!) 114  Temp: (!) 97.2 F (36.2 C)  SpO2: 99%  Weight: 178 lb (80.7 kg)  Height: '5\' 5"'$  (1.651 m)   Body mass index is 29.62 kg/m.  Results for orders placed or performed in visit on 11/27/21  POCT urinalysis dipstick  Result Value Ref Range   Color, UA yellow yellow   Clarity, UA cloudy (A) clear   Glucose, UA negative negative mg/dL   Bilirubin, UA negative negative   Ketones, POC UA negative negative mg/dL   Spec Grav, UA 1.020 1.010 - 1.025   Blood, UA trace-intact (A) negative   pH, UA 6.0 5.0 - 8.0   Protein Ur, POC negative negative mg/dL   Urobilinogen, UA 0.2 0.2 or 1.0 E.U./dL   Nitrite, UA Positive (A) Negative   Leukocytes,  UA Moderate (2+) (A) Negative   See labs 11/16/2021 and 11/17/2021       Assessment & Plan:   Problem List Items Addressed This Visit       Cardiovascular and Mediastinum   Stroke determined by clinical assessment [facility]) - Primary   Relevant Orders   Ambulatory referral to Neurology     Other   Anxiety   Relevant Medications   amitriptyline (ELAVIL) 50 MG tablet   ALPRAZolam (XANAX) 0.5 MG tablet   Other Visit Diagnoses     Dysuria       Relevant Orders   POCT urinalysis dipstick (Completed)   Urine Culture    Urinary frequency       Relevant Orders   POCT urinalysis dipstick (Completed)   Urine Culture   Vaginal itching          Meds ordered this encounter  Medications   metFORMIN (GLUCOPHAGE) 1000 MG tablet    Sig: Take 1 tablet (1,000 mg total) by mouth 2 (two) times daily before a meal.    Dispense:  180 tablet    Refill:  1    Order Specific Question:   Supervising Provider    Answer:   Sallee Lange A [[id number]]   amitriptyline (ELAVIL) 50 MG tablet    Sig: TAKE 1 TABLET BY MOUTH AT BEDTIME    Dispense:  90 tablet    Refill:  0    Order Specific Question:   Supervising Provider    Answer:   Sallee Lange A [[id number]]   gabapentin (NEURONTIN) 600 MG tablet    Sig: Take 1 tablet (600 mg total) by mouth every 6 (six) hours as needed for pain.    Dispense:  360 tablet    Refill:  2    Order Specific Question:   Supervising Provider    Answer:   Sallee Lange A [[id number]]   fluconazole (DIFLUCAN) 150 MG tablet    Sig: Take 1 tablet (150 mg total) by mouth daily as needed for yeast infection; may repeat in 3 to 4 days if needed.    Dispense:  2 tablet    Refill:  0    Order Specific Question:   Supervising Provider    Answer:   Sallee Lange A [[id number]]   ALPRAZolam (XANAX) 0.5 MG tablet    Sig: Take 0.5-1 tablets (0.25-0.5 mg total) by mouth at bedtime as needed for sleep.    Dispense:  45 tablet    Refill:  1    May refill 30 days from 11/13/21    Order Specific Question:   Supervising Provider    Answer:   Sallee Lange A [[id number]]   cephALEXin (KEFLEX) 500 MG capsule    Sig: Take 1 capsule (500 mg total) by mouth 3 (three) times daily.    Dispense:  21 capsule    Refill:  0    Order Specific Question:   Supervising Provider    Answer:   Sallee Lange A [[id number]]   Refills given for her regular medications. Diflucan ordered for probable vaginal yeast infection. Refill Xanax, increased number to 45/month.  Continue to use sparingly. Based on her current symptoms, recommend patient had  intermittent time off approximately 2 days/week.  Patient will get her FMLA forms to the office so they can be completed. Referred to neurologist.  Patient to seek help immediately in the meantime if her symptoms recur. Based on her recent history of frequent and out caths and findings on UA  today, will start cephalexin as directed.  Urine culture pending.  Further follow-up based on results.  Warning signs reviewed.  Patient to seek help over the weekend if any problems. Return in 3 months (on 02/27/2022), or if symptoms worsen or fail to improve. Recheck sooner if needed.

## 2021-11-28 ENCOUNTER — Other Ambulatory Visit (HOSPITAL_COMMUNITY): Payer: Self-pay

## 2021-11-28 ENCOUNTER — Encounter: Payer: Self-pay | Admitting: Nurse Practitioner

## 2021-12-01 LAB — URINE CULTURE

## 2021-12-04 ENCOUNTER — Encounter: Payer: Self-pay | Admitting: Nurse Practitioner

## 2021-12-04 ENCOUNTER — Other Ambulatory Visit (HOSPITAL_COMMUNITY): Payer: Self-pay

## 2021-12-04 ENCOUNTER — Other Ambulatory Visit: Payer: Self-pay | Admitting: Nurse Practitioner

## 2021-12-04 MED ORDER — FLUCONAZOLE 100 MG PO TABS
100.0000 mg | ORAL_TABLET | Freq: Every day | ORAL | 0 refills | Status: DC
  Filled 2021-12-04: qty 3, 3d supply, fill #0

## 2021-12-04 MED ORDER — SULFAMETHOXAZOLE-TRIMETHOPRIM 800-160 MG PO TABS: 1.0000 | ORAL_TABLET | Freq: Two times a day (BID) | ORAL | 0 refills | Status: DC

## 2021-12-04 MED ORDER — FLUCONAZOLE 150 MG PO TABS: 150.0000 mg | ORAL_TABLET | Freq: Every day | ORAL | 0 refills | Status: DC | PRN

## 2021-12-07 ENCOUNTER — Other Ambulatory Visit (HOSPITAL_COMMUNITY): Payer: Self-pay

## 2021-12-08 ENCOUNTER — Other Ambulatory Visit (HOSPITAL_COMMUNITY): Payer: Self-pay

## 2021-12-16 ENCOUNTER — Telehealth: Payer: Self-pay | Admitting: Nurse Practitioner

## 2021-12-16 ENCOUNTER — Telehealth: Payer: Self-pay

## 2021-12-16 ENCOUNTER — Other Ambulatory Visit: Payer: Self-pay | Admitting: Nurse Practitioner

## 2021-12-16 DIAGNOSIS — I639 Cerebral infarction, unspecified: Secondary | ICD-10-CM

## 2021-12-16 NOTE — Telephone Encounter (Signed)
Patient had FMLA faxed over to be completed in your blue box.

## 2021-12-16 NOTE — Telephone Encounter (Signed)
Caller name: AUBREA MEIXNER  On DPR?: Yes  Call back number: [phone] (mobile)  Provider they see: Kathyrn Drown, MD  Reason for call:[place] got a call from [facility] they received a referral but this is the wrong neurology office needs to go to Dr Tomi Likens at [facility] [phone]

## 2021-12-17 ENCOUNTER — Other Ambulatory Visit (HOSPITAL_COMMUNITY): Payer: Self-pay

## 2021-12-18 ENCOUNTER — Other Ambulatory Visit (HOSPITAL_COMMUNITY): Payer: Self-pay

## 2021-12-18 ENCOUNTER — Encounter: Payer: Self-pay | Admitting: Nurse Practitioner

## 2021-12-18 MED ORDER — OMEGA-3-ACID ETHYL ESTERS 1 G PO CAPS
2.0000 | ORAL_CAPSULE | Freq: Two times a day (BID) | ORAL | 1 refills | Status: DC
  Filled 2021-12-18: qty 360, 90d supply, fill #0
  Filled 2022-03-21: qty 360, 90d supply, fill #1

## 2021-12-18 NOTE — Telephone Encounter (Signed)
New referral placed.

## 2021-12-19 ENCOUNTER — Encounter: Payer: Self-pay | Admitting: Nurse Practitioner

## 2021-12-21 ENCOUNTER — Other Ambulatory Visit (HOSPITAL_COMMUNITY): Payer: Self-pay

## 2021-12-23 NOTE — Telephone Encounter (Signed)
FMLA completed and given to front desk staff on 12/22/21

## 2021-12-25 ENCOUNTER — Encounter: Payer: Self-pay | Admitting: Neurology

## 2021-12-29 ENCOUNTER — Other Ambulatory Visit: Payer: Self-pay | Admitting: Nurse Practitioner

## 2021-12-29 ENCOUNTER — Other Ambulatory Visit (HOSPITAL_COMMUNITY): Payer: Self-pay

## 2022-01-01 ENCOUNTER — Other Ambulatory Visit (HOSPITAL_COMMUNITY): Payer: Self-pay

## 2022-01-01 MED ORDER — MOUNJARO 15 MG/0.5ML ~~LOC~~ SOAJ
15.0000 mg | SUBCUTANEOUS | 1 refills | Status: DC
  Filled 2022-01-01: qty 2, 28d supply, fill #0
  Filled 2022-01-30 – 2022-02-09 (×2): qty 2, 28d supply, fill #1

## 2022-01-02 ENCOUNTER — Other Ambulatory Visit: Payer: Self-pay | Admitting: Nurse Practitioner

## 2022-01-02 DIAGNOSIS — J302 Other seasonal allergic rhinitis: Secondary | ICD-10-CM

## 2022-01-04 ENCOUNTER — Other Ambulatory Visit (HOSPITAL_COMMUNITY): Payer: Self-pay

## 2022-01-04 MED ORDER — CYANOCOBALAMIN 1000 MCG/ML IJ SOLN
1000.0000 ug | INTRAMUSCULAR | 0 refills | Status: DC
  Filled 2022-01-04: qty 3, 90d supply, fill #0

## 2022-01-04 MED ORDER — MONTELUKAST SODIUM 10 MG PO TABS
10.0000 mg | ORAL_TABLET | Freq: Every day | ORAL | 1 refills | Status: DC
  Filled 2022-01-04: qty 90, 90d supply, fill #0
  Filled 2022-05-11 – 2022-05-12 (×2): qty 90, 90d supply, fill #1

## 2022-01-05 ENCOUNTER — Other Ambulatory Visit (HOSPITAL_COMMUNITY): Payer: Self-pay

## 2022-01-05 NOTE — Progress Notes (Unsigned)
NEUROLOGY CONSULTATION NOTE  TRANNIE BARDALES MRN: [id number] DOB: 05-28-1970  Referring provider: Sallee Lange, MD Primary care provider: Sallee Lange, MD  Reason for consult:  stroke-like event  Assessment/Plan:   Hemiplegic migraine.  TIA felt to be less likely.  She does have stroke risk factors such as high cholesterol and diabetes, but based on age, associated headache and normal MRI without any evidence of chronic microvascular ischemic changes, migraine more likely Migraine without aura, without status migrainosus, not intractable Diabetic polyneuropathy  Still need to remain on ASA '81mg'$  daily and optimize management of stroke risk factors as per PCP:  Hgb A1c goal less than 7 and LDL goal less than 70 Migraine prevention:  Not indicated. Migraine rescue:  Triptans are contraindicated.  Stop rizatriptan.  She will try samples of Ubrelvy '100mg'$  To address neuropathic pain, increase gabapentin to '800mg'$  three times daily. To complete neuropathy workup, will check IFE. Limit use of pain relievers to no more than 2 days out of week to prevent risk of rebound or medication-overuse headache. Keep headache diary Follow up 4 to 5 months.    Subjective:  [name] is a 51 year old right-handed female with DM II, hypercholesterolemia, migraines and anxiety who presents for stroke-like event.  History supplemented by hospital and primary care notes.  She is accompanied by her husband.  On 11/16/2021, she developed right sided facial droop, slurred speech, blurred vision and right sided upper and lower extremity numbness and weakness.  She also had severe pounding headache on top of her head.  She presented to [facility] where she was a code stroke and had an NIHSS of 5.  CT head showed no possible hypodensity in the posterior left temporal lobe, which was determined to be artifact.  She received TNK and was transferred to [facility].  Symptoms lasted no more than 2 to 3  hours.  CTA head and neck showed no LVO or hemodynamically significant stenosis.  MRI of brain showed no acute intracranial abnormality.  2D echo showed LVEF 65-70%.  LDL was 38 and Hgb A1c was 6.8.  Discharged on ASA '81mg'$  and Plavix '75mg'$  daily for 21 days followed by ASA '81mg'$  monotherapy.    She does have history of migraines which are manageable.  They are right sided associated with blurred vision and photophobia lasting 2 hours with rizatriptan and occurs no more than 2 days a month.  She also has diabetic polyneuropathy since at least 2019.  She endorses numbness and pain in the feet.  Underwent workup at that time.  Labs were unremarkable, including B12 >2000, MMA 138, folate 10.3, B1 142.3, B6 6.1, negative ANA, negative RF, sed rate 13, negative SSA/SSB antibodies, negative pan-ANCA panel, negative Lyme IgG/IgM, nonreactive RPR, negative Hep C antibody, negative Celiac panel, negative heavy metals.  Hgb A1c increased in 2022 but has steadily declined from 7.5 to 6.8.    Past NSAIDS/analgesics:  naproxen, ibuprofen, diclofenac, indomethacin, acetaminophen Past abortive triptans:  sumatriptan Past anti-emetic:  Zofran Past antihypertensive medications:  metoprolol, losartan, valsartan Past antidepressant medications:  sertraline, escitalopram   Current NSAIDS/analgesics:  ASA '81mg'$  daily Current triptans:  rizatriptan Current ergotamine:  none Current anti-emetic:  none Current muscle relaxants:  Flexeril '10mg'$  QD PRN Current Antihypertensive medications:  none Current Antidepressant medications:  amitriptyline '50mg'$  QHS Current Anticonvulsant medications:  gabapentin '600mg'$  three times daily (pain) Current anti-CGRP:  none Other medications:  alprazolam PRN (sleep), rosuvastatin '10mg'$   PAST MEDICAL HISTORY: Past Medical History:  Diagnosis Date   Allergy    Anxiety    Diabetes mellitus without complication ([place])    Gout    History of migraine headaches    Hx of migraines     IBS (irritable bowel syndrome)    Impaired fasting glucose     PAST SURGICAL HISTORY: Past Surgical History:  Procedure Laterality Date   ABDOMINAL HYSTERECTOMY  2005   partial   BLADDER SUSPENSION  2012   BREAST REDUCTION SURGERY  2002   CHOLECYSTECTOMY  2005   COLONOSCOPY  07/10/2021   EYE SURGERY  1999   laser    UPPER GASTROINTESTINAL ENDOSCOPY     20 years ago by Dr. Gala Romney. - told she had ulcers    MEDICATIONS: Current Outpatient Medications on File Prior to Visit  Medication Sig Dispense Refill   allopurinol (ZYLOPRIM) 300 MG tablet Take 1 tablet (300 mg total) by mouth 2 (two) times daily. 180 tablet 0   ALPRAZolam (XANAX) 0.5 MG tablet Take 0.5-1 tablets (0.25-0.5 mg total) by mouth at bedtime as needed for sleep. 45 tablet 1   amitriptyline (ELAVIL) 50 MG tablet TAKE 1 TABLET BY MOUTH AT BEDTIME 90 tablet 0   aspirin EC 81 MG tablet Take 1 tablet (81 mg total) by mouth daily. Swallow whole. 30 tablet 0   clopidogrel (PLAVIX) 75 MG tablet Take 1 tablet (75 mg total) by mouth daily. 21 tablet 0   Continuous Blood Gluc Sensor (FREESTYLE LIBRE 3 SENSOR) MISC Apply as directed every 14 days 6 each 1   cyanocobalamin (VITAMIN B12) 1000 MCG/ML injection Inject 1 mL into the muscle every 30 days. 3 mL 0   cyclobenzaprine (FLEXERIL) 10 MG tablet Take 10 mg by mouth daily as needed for muscle spasms.     famotidine (PEPCID) 40 MG tablet Take 1 tablet (40 mg total) by mouth at bedtime. 90 tablet 3   fluconazole (DIFLUCAN) 100 MG tablet Take 1 tablet (100 mg total) by mouth daily for 3 days 3 tablet 0   fluconazole (DIFLUCAN) 150 MG tablet Take 1 tablet (150 mg total) by mouth daily as needed for yeast infection; may repeat in 3 to 4 days if needed. 2 tablet 0   gabapentin (NEURONTIN) 600 MG tablet Take 1 tablet (600 mg total) by mouth every 6 (six) hours as needed for pain. 360 tablet 2   glucose blood (FREESTYLE LITE) test strip Use twice daily as directed to check blood sugar 100  each 1   lidocaine-prilocaine (EMLA) cream APPLY TO FEET AS NEEDED FOR NERVE PAIN (Patient taking differently: Apply 1 Application topically daily as needed (nerve pain).) 30 g 5   LOTEMAX SM 0.38 % GEL Place 1 Application into the left eye daily.     magic mouthwash (nystatin, hydrocortisone, diphenhydrAMINE) suspension Swish and spit 5 MLs by mouth  3 times a day as needed (Patient taking differently: Swish and spit 5 mLs in the morning, at noon, and at bedtime.) 540 mL 2   metFORMIN (GLUCOPHAGE) 1000 MG tablet Take 1 tablet (1,000 mg total) by mouth 2 (two) times daily before a meal. 180 tablet 1   montelukast (SINGULAIR) 10 MG tablet Take 1 tablet (10 mg total) by mouth daily. 90 tablet 1   omega-3 acid ethyl esters (LOVAZA) 1 g capsule TAKE 2 CAPSULES BY MOUTH TWO TIMES DAILY 360 capsule 1   pantoprazole (PROTONIX) 40 MG tablet Take 1 tablet (40 mg total) by mouth 2 (  two) times daily before a meal. 180 tablet 3   rosuvastatin (CRESTOR) 10 MG tablet Take 1 tablet (10 mg total) by mouth at bedtime. 90 tablet 1   sulfamethoxazole-trimethoprim (BACTRIM DS) 800-160 MG tablet Take 1 tablet by mouth 2 (two) times daily. 14 tablet 0   tirzepatide (MOUNJARO) 15 MG/0.5ML Pen Inject 15 mg into the skin once a week. 6 mL 1   Varenicline Tartrate (TYRVAYA) 0.03 MG/ACT SOLN PLACE 1 SQUIRT INTRANASALLY 2 TIMES DAILY (Patient taking differently: Place 1 spray into the nose in the morning and at bedtime.) 8.4 mL 12   Vitamin D, Ergocalciferol, (DRISDOL) 1.25 MG (50000 UNIT) CAPS capsule Take 1 capsule by mouth every 7 days. 8 capsule 0   [DISCONTINUED] cetirizine (ZYRTEC) 10 MG tablet Take 1 tablet (10 mg total) by mouth daily. 90 tablet 3   No current facility-administered medications on file prior to visit.    ALLERGIES: Allergies  Allergen Reactions   Omnicef [Cefdinir] Other (See Comments)    Patient states antibiotic does not work. Has tried multiple times.   Losartan Rash    Broke out on face only;  no allergic reaction    FAMILY HISTORY: Family History  Problem Relation Age of Onset   Diabetes Mother    Neuropathy Mother    Heart failure Mother    Hypertension Mother    Cancer Mother    Atrial fibrillation Father    Hypertension Father    Colonic polyp Father     Objective:  Blood pressure 119/67, pulse (!) 116, height '5\' 6"'$  (1.676 m), weight 176 lb (79.8 kg), SpO2 99 %. General: No acute distress.  Patient appears well-groomed.   Head:  Normocephalic/atraumatic Eyes:  fundi examined but not visualized Neck: supple, no paraspinal tenderness, full range of motion Back: No paraspinal tenderness Heart: regular rate and rhythm Lungs: Clear to auscultation bilaterally. Vascular: No carotid bruits. Neurological Exam: Mental status: alert and oriented to person, place, and time, speech fluent and not dysarthric, language intact. Cranial nerves: CN I: not tested CN II: pupils equal, round and reactive to light, visual fields intact CN III, IV, VI:  full range of motion, no nystagmus, no ptosis CN V: facial sensation intact. CN VII: upper and lower face symmetric CN VIII: hearing intact CN IX, X: gag intact, uvula midline CN XI: sternocleidomastoid and trapezius muscles intact CN XII: tongue midline Bulk & Tone: normal, no fasciculations. Motor:  muscle strength 5/5 throughout Sensation:  Pinprick, temperature and vibratory sensation intact. Deep Tendon Reflexes:  2+ throughout,  toes downgoing.   Finger to nose testing:  Without dysmetria.   Heel to shin:  Without dysmetria.   Gait:  Normal station and stride.  Romberg negative.    Thank you for allowing me to take part in the care of this patient.  Metta Clines, DO  CC: Sallee Lange, MD

## 2022-01-06 ENCOUNTER — Other Ambulatory Visit (INDEPENDENT_AMBULATORY_CARE_PROVIDER_SITE_OTHER): Payer: 59

## 2022-01-06 ENCOUNTER — Encounter: Payer: Self-pay | Admitting: Neurology

## 2022-01-06 ENCOUNTER — Ambulatory Visit (INDEPENDENT_AMBULATORY_CARE_PROVIDER_SITE_OTHER): Payer: 59 | Admitting: Neurology

## 2022-01-06 ENCOUNTER — Other Ambulatory Visit (HOSPITAL_COMMUNITY): Payer: Self-pay

## 2022-01-06 VITALS — BP 119/67 | HR 116 | Ht 66.0 in | Wt 176.0 lb

## 2022-01-06 DIAGNOSIS — G43009 Migraine without aura, not intractable, without status migrainosus: Secondary | ICD-10-CM

## 2022-01-06 DIAGNOSIS — G43409 Hemiplegic migraine, not intractable, without status migrainosus: Secondary | ICD-10-CM | POA: Diagnosis not present

## 2022-01-06 DIAGNOSIS — E1142 Type 2 diabetes mellitus with diabetic polyneuropathy: Secondary | ICD-10-CM | POA: Diagnosis not present

## 2022-01-06 MED ORDER — GABAPENTIN 800 MG PO TABS
800.0000 mg | ORAL_TABLET | Freq: Three times a day (TID) | ORAL | 5 refills | Status: DC
  Filled 2022-01-06: qty 90, 30d supply, fill #0

## 2022-01-06 NOTE — Patient Instructions (Signed)
Increase gabapentin to '800mg'$  three times daily.  Continue amitriptyline '50mg'$  at bedtime Check blood work Continue aspirin '81mg'$  daily Stop rizatriptan.  At earliest onset of migraine, take Ubrelvy.  May repeat after 2 hours (maximum 2 tablets in 24 hours).  Let me know if it works Follow up 4-5 months.

## 2022-01-06 NOTE — Progress Notes (Unsigned)
Medication Samples have been provided to the patient.  Drug name: Roselyn Meier       Strength: '100mg'$         Qty: 4  LOT: [id number]  Exp.Date: 10/2023 Dosing instructions: take at onset of headache my repeat in 2 hours no more than 2 in 24 hours  The patient has been instructed regarding the correct time, dose, and frequency of taking this medication, including desired effects and most common side effects.   Feliberto Harts 1:34 PM 01/06/2022

## 2022-01-07 ENCOUNTER — Other Ambulatory Visit (HOSPITAL_COMMUNITY): Payer: Self-pay

## 2022-01-07 ENCOUNTER — Encounter: Payer: Self-pay | Admitting: Neurology

## 2022-01-08 ENCOUNTER — Other Ambulatory Visit: Payer: Self-pay | Admitting: Neurology

## 2022-01-08 ENCOUNTER — Other Ambulatory Visit (HOSPITAL_COMMUNITY): Payer: Self-pay

## 2022-01-08 MED ORDER — MIEBO 1.338 GM/ML OP SOLN
OPHTHALMIC | 99 refills | Status: DC
  Filled 2022-01-08: qty 3, 15d supply, fill #0
  Filled 2022-01-27 – 2022-01-30 (×4): qty 3, 15d supply, fill #1
  Filled 2022-02-01: qty 3, 30d supply, fill #1
  Filled 2022-02-05 (×2): qty 3, 15d supply, fill #1
  Filled 2022-03-05 (×2): qty 3, 15d supply, fill #2
  Filled 2022-04-22: qty 3, 15d supply, fill #3
  Filled 2022-05-09: qty 3, 30d supply, fill #4
  Filled 2022-05-11 – 2022-05-12 (×2): qty 3, 15d supply, fill #4
  Filled 2022-06-15 (×2): qty 3, 15d supply, fill #5
  Filled 2022-07-26: qty 3, 15d supply, fill #6
  Filled 2022-08-09 – 2022-08-10 (×3): qty 3, 15d supply, fill #7
  Filled 2022-08-23 – 2022-08-30 (×2): qty 3, 15d supply, fill #8
  Filled 2022-09-23 – 2022-12-17 (×5): qty 3, 15d supply, fill #9
  Filled 2023-01-03: qty 3, 15d supply, fill #10

## 2022-01-08 MED ORDER — GABAPENTIN 800 MG PO TABS
800.0000 mg | ORAL_TABLET | Freq: Three times a day (TID) | ORAL | 1 refills | Status: DC
  Filled 2022-01-08 – 2022-02-09 (×2): qty 270, 90d supply, fill #0
  Filled 2022-05-09: qty 270, 90d supply, fill #1

## 2022-01-09 LAB — IMMUNOFIXATION ELECTROPHORESIS
IgG (Immunoglobin G), Serum: 814 mg/dL (ref 600–1640)
IgM, Serum: 45 mg/dL — ABNORMAL LOW (ref 50–300)
Immunoglobulin A: 138 mg/dL (ref 47–310)

## 2022-01-11 ENCOUNTER — Other Ambulatory Visit (HOSPITAL_COMMUNITY): Payer: Self-pay

## 2022-01-12 ENCOUNTER — Other Ambulatory Visit (HOSPITAL_COMMUNITY): Payer: Self-pay

## 2022-01-19 ENCOUNTER — Other Ambulatory Visit: Payer: Self-pay | Admitting: Nurse Practitioner

## 2022-01-19 ENCOUNTER — Encounter: Payer: Self-pay | Admitting: Nurse Practitioner

## 2022-01-20 ENCOUNTER — Other Ambulatory Visit (HOSPITAL_COMMUNITY): Payer: Self-pay

## 2022-01-20 DIAGNOSIS — H5213 Myopia, bilateral: Secondary | ICD-10-CM | POA: Diagnosis not present

## 2022-01-20 MED ORDER — LIDOCAINE-PRILOCAINE 2.5-2.5 % EX CREA
TOPICAL_CREAM | CUTANEOUS | 5 refills | Status: DC
  Filled 2022-01-20: qty 30, 15d supply, fill #0
  Filled 2022-02-09: qty 30, 15d supply, fill #1
  Filled 2022-03-05: qty 30, 15d supply, fill #2
  Filled 2022-04-19: qty 30, 15d supply, fill #3
  Filled 2022-05-09 – 2022-05-12 (×3): qty 30, 15d supply, fill #4
  Filled 2022-05-26: qty 30, 15d supply, fill #5

## 2022-01-21 ENCOUNTER — Other Ambulatory Visit (HOSPITAL_COMMUNITY): Payer: Self-pay

## 2022-01-22 ENCOUNTER — Other Ambulatory Visit (HOSPITAL_COMMUNITY): Payer: Self-pay

## 2022-01-22 ENCOUNTER — Ambulatory Visit: Payer: 59 | Admitting: Nurse Practitioner

## 2022-01-24 ENCOUNTER — Telehealth: Payer: 59 | Admitting: Family

## 2022-01-24 DIAGNOSIS — R399 Unspecified symptoms and signs involving the genitourinary system: Secondary | ICD-10-CM

## 2022-01-24 MED ORDER — SULFAMETHOXAZOLE-TRIMETHOPRIM 800-160 MG PO TABS: 1.0000 | ORAL_TABLET | Freq: Two times a day (BID) | ORAL | 0 refills | Status: DC

## 2022-01-24 MED ORDER — FLUCONAZOLE 150 MG PO TABS: 150.0000 mg | ORAL_TABLET | ORAL | 0 refills | Status: DC | PRN

## 2022-01-24 NOTE — Progress Notes (Signed)
E-Visit for Urinary Problems  We are sorry that you are not feeling well.  Here is how we plan to help!  Based on what you shared with me it looks like you most likely have a simple urinary tract infection.  A UTI (Urinary Tract Infection) is a bacterial infection of the bladder.  Most cases of urinary tract infections are simple to treat but a key part of your care is to encourage you to drink plenty of fluids and watch your symptoms carefully.  I have prescribed Bactrim DS One tablet twice a day for 5 days.  Your symptoms should gradually improve. Call us if the burning in your urine worsens, you develop worsening fever, back pain or pelvic pain or if your symptoms do not resolve after completing the antibiotic.  Urinary tract infections can be prevented by drinking plenty of water to keep your body hydrated.  Also be sure when you wipe, wipe from front to back and don't hold it in!  If possible, empty your bladder every 4 hours.  HOME CARE Drink plenty of fluids Compete the full course of the antibiotics even if the symptoms resolve Remember, when you need to go.go. Holding in your urine can increase the likelihood of getting a UTI! GET HELP RIGHT AWAY IF: You cannot urinate You get a high fever Worsening back pain occurs You see blood in your urine You feel sick to your stomach or throw up You feel like you are going to pass out  MAKE SURE YOU  Understand these instructions. Will watch your condition. Will get help right away if you are not doing well or get worse.   Thank you for choosing an e-visit.  Your e-visit answers were reviewed by a board certified advanced clinical practitioner to complete your personal care plan. Depending upon the condition, your plan could have included both over the counter or prescription medications.  Please review your pharmacy choice. Make sure the pharmacy is open so you can pick up prescription now. If there is a problem, you may contact  your provider through [employer] and have the prescription routed to another pharmacy.  Your safety is important to [place]. If you have drug allergies check your prescription carefully.   For the next 24 hours you can use MyChart to ask questions about today's visit, request a non-urgent call back, or ask for a work or school excuse. You will get an email in the next two days asking about your experience. I hope that your e-visit has been valuable and will speed your recovery.  Approximately 5 minutes was spent documenting and reviewing patient's chart.

## 2022-01-27 ENCOUNTER — Other Ambulatory Visit (HOSPITAL_COMMUNITY): Payer: Self-pay

## 2022-01-28 ENCOUNTER — Other Ambulatory Visit (HOSPITAL_COMMUNITY): Payer: Self-pay

## 2022-01-29 ENCOUNTER — Ambulatory Visit: Payer: 59 | Admitting: Nurse Practitioner

## 2022-01-29 ENCOUNTER — Other Ambulatory Visit: Payer: Self-pay | Admitting: Nurse Practitioner

## 2022-01-29 ENCOUNTER — Other Ambulatory Visit (HOSPITAL_COMMUNITY): Payer: Self-pay

## 2022-01-29 MED ORDER — AMITRIPTYLINE HCL 50 MG PO TABS
50.0000 mg | ORAL_TABLET | Freq: Every day | ORAL | 1 refills | Status: DC
  Filled 2022-01-29: qty 90, fill #0
  Filled 2022-02-28: qty 90, 90d supply, fill #0
  Filled 2022-05-26: qty 90, 90d supply, fill #1

## 2022-01-29 MED ORDER — ROSUVASTATIN CALCIUM 10 MG PO TABS
10.0000 mg | ORAL_TABLET | Freq: Every day | ORAL | 1 refills | Status: DC
  Filled 2022-01-29 – 2022-02-28 (×2): qty 90, 90d supply, fill #0
  Filled 2022-05-23: qty 90, 90d supply, fill #1

## 2022-01-30 ENCOUNTER — Other Ambulatory Visit (HOSPITAL_COMMUNITY): Payer: Self-pay

## 2022-02-01 ENCOUNTER — Other Ambulatory Visit (HOSPITAL_COMMUNITY): Payer: Self-pay

## 2022-02-04 ENCOUNTER — Other Ambulatory Visit (HOSPITAL_COMMUNITY): Payer: Self-pay

## 2022-02-05 ENCOUNTER — Other Ambulatory Visit (HOSPITAL_COMMUNITY): Payer: Self-pay

## 2022-02-08 ENCOUNTER — Telehealth: Payer: 59 | Admitting: Nurse Practitioner

## 2022-02-08 DIAGNOSIS — J0140 Acute pansinusitis, unspecified: Secondary | ICD-10-CM | POA: Diagnosis not present

## 2022-02-08 DIAGNOSIS — R051 Acute cough: Secondary | ICD-10-CM

## 2022-02-08 MED ORDER — BENZONATATE 100 MG PO CAPS: 100.0000 mg | ORAL_CAPSULE | Freq: Three times a day (TID) | ORAL | 0 refills | Status: DC | PRN

## 2022-02-08 MED ORDER — AMOXICILLIN-POT CLAVULANATE 875-125 MG PO TABS: 1.0000 | ORAL_TABLET | Freq: Two times a day (BID) | ORAL | 0 refills | Status: AC

## 2022-02-08 MED ORDER — PROMETHAZINE-DM 6.25-15 MG/5ML PO SYRP: 5.0000 mL | ORAL_SOLUTION | Freq: Four times a day (QID) | ORAL | 0 refills | Status: AC | PRN

## 2022-02-08 NOTE — Progress Notes (Signed)
E-Visit for Sinus Problems  We are sorry that you are not feeling well.  Here is how we plan to help!  Based on what you have shared with me it looks like you have sinusitis.  Sinusitis is inflammation and infection in the sinus cavities of the head.  Based on your presentation I believe you most likely have Acute Bacterial Sinusitis.  This is an infection caused by bacteria and is treated with antibiotics. I have prescribed Augmentin '875mg'$ /'125mg'$  one tablet twice daily with food, for 7 days. You may use an oral decongestant such as Mucinex D or if you have glaucoma or high blood pressure use plain Mucinex. Saline nasal spray help and can safely be used as often as needed for congestion.  If you develop worsening sinus pain, fever or notice severe headache and vision changes, or if symptoms are not better after completion of antibiotic, please schedule an appointment with a health care provider.    Sinus infections are not as easily transmitted as other respiratory infection, however we still recommend that you avoid close contact with loved ones, especially the very young and elderly.  Remember to wash your hands thoroughly throughout the day as this is the number one way to prevent the spread of infection!  For your cough we have prescribed two different medications.  They cough medicine promethazine-DM should be used at night and should not be taken with any other medications that may cause sedation including Xanax, or night time cold medications or sleep aids.   The cough medication Benzonatate is not sedating and can be used during the day. The two cough medications should not be taken within 6 hours of each other.    Meds ordered this encounter  Medications   promethazine-dextromethorphan (PROMETHAZINE-DM) 6.25-15 MG/5ML syrup    Sig: Take 5 mLs by mouth 4 (four) times daily as needed for up to 5 days for cough.    Dispense:  100 mL    Refill:  0   amoxicillin-clavulanate (AUGMENTIN) 875-125  MG tablet    Sig: Take 1 tablet by mouth 2 (two) times daily for 7 days. Take with food    Dispense:  14 tablet    Refill:  0   benzonatate (TESSALON) 100 MG capsule    Sig: Take 1 capsule (100 mg total) by mouth 3 (three) times daily as needed.    Dispense:  30 capsule    Refill:  0     Home Care: Only take medications as instructed by your medical team. Complete the entire course of an antibiotic. Do not take these medications with alcohol. A steam or ultrasonic humidifier can help congestion.  You can place a towel over your head and breathe in the steam from hot water coming from a faucet. Avoid close contacts especially the very young and the elderly. Cover your mouth when you cough or sneeze. Always remember to wash your hands.  Get Help Right Away If: You develop worsening fever or sinus pain. You develop a severe head ache or visual changes. Your symptoms persist after you have completed your treatment plan.  Make sure you Understand these instructions. Will watch your condition. Will get help right away if you are not doing well or get worse.  Thank you for choosing an e-visit.  Your e-visit answers were reviewed by a board certified advanced clinical practitioner to complete your personal care plan. Depending upon the condition, your plan could have included both over the counter or prescription medications.  Please review your pharmacy choice. Make sure the pharmacy is open so you can pick up prescription now. If there is a problem, you may contact your provider through [employer] and have the prescription routed to another pharmacy.  Your safety is important to [place]. If you have drug allergies check your prescription carefully.   For the next 24 hours you can use MyChart to ask questions about today's visit, request a non-urgent call back, or ask for a work or school excuse. You will get an email in the next two days asking about your experience. I hope that your  e-visit has been valuable and will speed your recovery.   I spent approximately 7 minutes reviewing the patient's history, current symptoms and coordinating their plan of care today.

## 2022-02-09 ENCOUNTER — Other Ambulatory Visit (HOSPITAL_COMMUNITY): Payer: Self-pay

## 2022-02-09 ENCOUNTER — Other Ambulatory Visit: Payer: Self-pay

## 2022-02-10 ENCOUNTER — Other Ambulatory Visit (HOSPITAL_COMMUNITY): Payer: Self-pay

## 2022-02-10 ENCOUNTER — Other Ambulatory Visit: Payer: Self-pay

## 2022-02-11 ENCOUNTER — Other Ambulatory Visit (HOSPITAL_COMMUNITY): Payer: Self-pay

## 2022-02-12 ENCOUNTER — Other Ambulatory Visit (HOSPITAL_COMMUNITY): Payer: Self-pay

## 2022-02-17 ENCOUNTER — Ambulatory Visit (INDEPENDENT_AMBULATORY_CARE_PROVIDER_SITE_OTHER): Payer: 59 | Admitting: Orthopedic Surgery

## 2022-02-17 ENCOUNTER — Encounter: Payer: Self-pay | Admitting: Orthopedic Surgery

## 2022-02-17 ENCOUNTER — Ambulatory Visit (INDEPENDENT_AMBULATORY_CARE_PROVIDER_SITE_OTHER): Payer: 59

## 2022-02-17 VITALS — BP 140/86 | HR 130 | Ht 66.0 in | Wt 176.0 lb

## 2022-02-17 DIAGNOSIS — M1812 Unilateral primary osteoarthritis of first carpometacarpal joint, left hand: Secondary | ICD-10-CM | POA: Diagnosis not present

## 2022-02-17 DIAGNOSIS — M79645 Pain in left finger(s): Secondary | ICD-10-CM

## 2022-02-17 DIAGNOSIS — M25542 Pain in joints of left hand: Secondary | ICD-10-CM

## 2022-02-17 MED ORDER — METHYLPREDNISOLONE ACETATE 40 MG/ML IJ SUSP
40.0000 mg | Freq: Once | INTRAMUSCULAR | Status: AC
  Administered 2022-02-17: 40 mg via INTRA_ARTICULAR

## 2022-02-17 NOTE — Addendum Note (Signed)
Addended byCandice Camp on: 02/17/2022 11:03 AM   Modules accepted: Orders

## 2022-02-17 NOTE — Progress Notes (Signed)
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  Denies pain with ulnar deviation  Past Medical History:  Diagnosis Date   Allergy    Anxiety    Diabetes mellitus without complication (HCC)    Gout    History of migraine headaches    Hx of migraines    IBS (irritable bowel syndrome)    Impaired fasting glucose     BP (!) 140/86   Pulse (!) 130   Ht '5\' 6"'$  (1.676 m)   Wt 176 lb (79.8 kg)   BMI 28.41 kg/m    General appearance: Well-developed well-nourished no gross deformities  Cardiovascular normal pulse and perfusion normal color without edema  Neurologically no sensation loss or deficits or pathologic reflexes  Psychological: Awake alert and oriented x3 mood and affect normal  Skin no lacerations or ulcerations no nodularity no palpable masses, no erythema or nodularity  Musculoskeletal:   Ambulatory no assistive devices  Left thumb exam  Tenderness at the [facility] joint mild crepitance on range of motion Before strength on pinching against the small finger Flexor extensor tendon strength normal Finkelstein's test normal  Imaging mild degenerative changes at the [facility] joint mainly at the wrap is ovoid bone with some cyst noted.  No joint space narrowing was seen  A/P  Encounter Diagnoses  Name Primary?   Arthritis of carpometacarpal (CMC) joint of left thumb Yes   Pain in thumb joint with movement, left    Recommended injection Ice Aspercreme Advil  Procedure note injection left thumb  Injection left CMC joint Medication Depo-Medrol 40 mg and lidocaine 1% 2 cc The patient gave verbal consent Timeout confirmed the site of injection left CMC joint  Alcohol and ethyl chloride was used to repair the skin and then a 25-gauge needle was used to inject  the [facility] joint of the left thumb  There were no complications a sterile Band-Aid was applied

## 2022-02-17 NOTE — Patient Instructions (Signed)
Apply ice to the thumb area 20 to 30 minutes in the evenings  Apply Aspercreme twice a day once in the morning once in the evening  Take 2 Advil twice a day for 2 weeks  If no improvement after 4 weeks with injection and above measures make a new appointment

## 2022-02-19 ENCOUNTER — Other Ambulatory Visit: Payer: Self-pay

## 2022-02-19 ENCOUNTER — Other Ambulatory Visit (HOSPITAL_COMMUNITY): Payer: Self-pay

## 2022-02-19 ENCOUNTER — Encounter: Payer: Self-pay | Admitting: Nurse Practitioner

## 2022-02-19 ENCOUNTER — Ambulatory Visit: Payer: 59 | Admitting: Nurse Practitioner

## 2022-02-19 VITALS — BP 112/66 | HR 110 | Temp 97.9°F | Wt 174.0 lb

## 2022-02-19 DIAGNOSIS — F419 Anxiety disorder, unspecified: Secondary | ICD-10-CM

## 2022-02-19 MED ORDER — MOUNJARO 15 MG/0.5ML ~~LOC~~ SOAJ
15.0000 mg | SUBCUTANEOUS | 1 refills | Status: DC
  Filled 2022-02-19 – 2022-03-01 (×2): qty 6, 84d supply, fill #0
  Filled 2022-03-04: qty 2, 28d supply, fill #0
  Filled 2022-03-29: qty 6, 84d supply, fill #1

## 2022-02-19 MED ORDER — AMOXICILLIN-POT CLAVULANATE 875-125 MG PO TABS
1.0000 | ORAL_TABLET | Freq: Two times a day (BID) | ORAL | 0 refills | Status: DC
  Filled 2022-02-19: qty 20, 10d supply, fill #0

## 2022-02-19 MED ORDER — ALPRAZOLAM 0.5 MG PO TABS
0.2500 mg | ORAL_TABLET | Freq: Two times a day (BID) | ORAL | 0 refills | Status: DC | PRN
  Filled 2022-02-19: qty 60, fill #0
  Filled 2022-03-05: qty 60, 30d supply, fill #0
  Filled ????-??-??: fill #0

## 2022-02-19 MED ORDER — NYSTATIN 100000 UNIT/ML MT SUSP: OROMUCOSAL | 0 refills | Status: DC

## 2022-02-19 MED ORDER — METFORMIN HCL 1000 MG PO TABS
1000.0000 mg | ORAL_TABLET | Freq: Two times a day (BID) | ORAL | 1 refills | Status: DC
  Filled 2022-02-19 – 2022-04-19 (×2): qty 180, 90d supply, fill #0

## 2022-02-19 NOTE — Progress Notes (Signed)
Subjective:    Patient ID: [name], female    DOB: Jun 06, 1970, 51 y.o.   MRN: [id number]  HPI  Patient arrives today to follow up Patient needs refills. Patient's main concern today is her stress level.  Her father passed away back in the fall.  Has had major issues regarding his estate.  Recently served legal papers from her sister which greatly upset her.  Requesting a temporary increase in the number of Xanax she takes per month.  Takes it only as needed.  Feels her stress level is out of control at times.  Patient complains of nasal congestion and green mucus.  This was mentioned at the end of the visit.  States she completed 7-day course of Augmentin through an e-visit on 02/08/22, was getting better but symptoms came back when this was completed.  Also requesting a refill of Magic mouthwash.   Defers GAD-7 screening.      02/19/2022   11:11 AM  Depression screen PHQ 2/9  Decreased Interest 0  Down, Depressed, Hopeless 0  PHQ - 2 Score 0  Altered sleeping 0  Tired, decreased energy 0  Change in appetite 0  Feeling bad or failure about yourself  0  Trouble concentrating 0  Moving slowly or fidgety/restless 0  Suicidal thoughts 0  PHQ-9 Score 0     Review of Systems  Constitutional:  Positive for fatigue. Negative for fever.  HENT:  Negative for ear pain and sore throat.   Respiratory:  Positive for cough. Negative for chest tightness, shortness of breath and wheezing.   Cardiovascular:  Negative for chest pain.  Psychiatric/Behavioral:  Positive for sleep disturbance. Negative for suicidal ideas. The patient is nervous/anxious.        Objective:   Physical Exam NAD.  Alert, oriented.  Lungs clear.  Heart regular rate rhythm.  Slightly tearful at times.  Calm affect.  Thoughts logical coherent and relevant.  Speech clear.  Dressed appropriately for the weather. Today's Vitals   02/19/22 1059  BP: 112/66  Pulse: (!) 110  Temp: 97.9 F (36.6 C)  TempSrc:  Oral  SpO2: 99%  Weight: 174 lb (78.9 kg)   Body mass index is 28.08 kg/m.        Assessment & Plan:   Problem List Items Addressed This Visit       Other   Anxiety - Primary   Relevant Medications   ALPRAZolam (XANAX) 0.5 MG tablet   Meds ordered this encounter  Medications   metFORMIN (GLUCOPHAGE) 1000 MG tablet    Sig: Take 1 tablet (1,000 mg total) by mouth 2 (two) times daily before a meal.    Dispense:  180 tablet    Refill:  1    Order Specific Question:   Supervising Provider    Answer:   Sallee Lange A [[id number]]   tirzepatide (MOUNJARO) 15 MG/0.5ML Pen    Sig: Inject 15 mg into the skin once a week.    Dispense:  6 mL    Refill:  1    Diagnosis: Type 2 diabetes E11.42 Currently taking Metformin 1000 mg BID    Order Specific Question:   Supervising Provider    Answer:   Sallee Lange A [[id number]]   amoxicillin-clavulanate (AUGMENTIN) 875-125 MG tablet    Sig: Take 1 tablet by mouth 2 (two) times daily.    Dispense:  20 tablet    Refill:  0    Order Specific Question:   Supervising Provider  Answer:   Sallee Lange A [[id number]]   ALPRAZolam (XANAX) 0.5 MG tablet    Sig: Take 1/2-1 tab po BID prn anxiety    Dispense:  60 tablet    Refill:  0    Order Specific Question:   Supervising Provider    Answer:   Sallee Lange A [[id number]]   magic mouthwash (nystatin, hydrocortisone, diphenhydrAMINE) suspension    Sig: Swish and spit 5 MLs by mouth  3 times a day as needed    Dispense:  540 mL    Refill:  0    Order Specific Question:   Supervising Provider    Answer:   Sallee Lange A [[id number]]    Requesting refill on Mounjaro and metformin. Also refill on Magic Mouthwash.  Restart Augmentin, given 10-day prescription.  Call back if symptoms persist. Increase Xanax to 60/month for the next month with reevaluation to see if she needs to continue the same dosing. Discussed the importance of stress reduction.  Patient plans on massage today.  Encouraged her to take care of  herself. Return in about 3 months (around 05/21/2022). For routine chronic disease management.

## 2022-02-19 NOTE — Patient Instructions (Signed)
Earlyne Iba

## 2022-02-23 ENCOUNTER — Other Ambulatory Visit: Payer: Self-pay

## 2022-02-24 ENCOUNTER — Other Ambulatory Visit: Payer: Self-pay

## 2022-02-24 ENCOUNTER — Other Ambulatory Visit (HOSPITAL_COMMUNITY): Payer: Self-pay

## 2022-02-24 ENCOUNTER — Encounter: Payer: Self-pay | Admitting: Nurse Practitioner

## 2022-02-24 NOTE — Telephone Encounter (Signed)
Patient notified via phone that [facility] has sent antibiotic by mail. Patient was also informed that Xanax will be ready in 2 weeks. Patient verbalized understanding.

## 2022-02-24 NOTE — Patient Outreach (Signed)
Telephone outreach to patient to obtain mRS was successfully completed. MRS= 0  [name] [facility] Care Management Assistant [phone]  

## 2022-03-01 ENCOUNTER — Other Ambulatory Visit: Payer: Self-pay

## 2022-03-01 ENCOUNTER — Other Ambulatory Visit (HOSPITAL_COMMUNITY): Payer: Self-pay

## 2022-03-02 ENCOUNTER — Other Ambulatory Visit (HOSPITAL_COMMUNITY): Payer: Self-pay

## 2022-03-04 ENCOUNTER — Other Ambulatory Visit: Payer: Self-pay

## 2022-03-05 ENCOUNTER — Other Ambulatory Visit: Payer: Self-pay | Admitting: Nurse Practitioner

## 2022-03-05 ENCOUNTER — Other Ambulatory Visit: Payer: Self-pay | Admitting: Family Medicine

## 2022-03-05 ENCOUNTER — Other Ambulatory Visit (HOSPITAL_COMMUNITY): Payer: Self-pay

## 2022-03-05 ENCOUNTER — Encounter: Payer: Self-pay | Admitting: Nurse Practitioner

## 2022-03-05 ENCOUNTER — Ambulatory Visit: Payer: 59 | Admitting: Family Medicine

## 2022-03-05 MED ORDER — ALPRAZOLAM 0.5 MG PO TABS
0.2500 mg | ORAL_TABLET | Freq: Two times a day (BID) | ORAL | 0 refills | Status: DC | PRN
  Filled 2022-03-05 – 2022-03-22 (×2): qty 60, 30d supply, fill #0

## 2022-03-05 MED ORDER — CYCLOBENZAPRINE HCL 10 MG PO TABS
10.0000 mg | ORAL_TABLET | Freq: Every day | ORAL | 0 refills | Status: DC | PRN
  Filled 2022-03-05: qty 90, 90d supply, fill #0

## 2022-03-05 MED ORDER — IBUPROFEN 800 MG PO TABS
800.0000 mg | ORAL_TABLET | Freq: Three times a day (TID) | ORAL | 0 refills | Status: DC | PRN
  Filled 2022-03-05: qty 30, 10d supply, fill #0

## 2022-03-05 MED ORDER — CYCLOBENZAPRINE HCL 10 MG PO TABS
10.0000 mg | ORAL_TABLET | Freq: Every day | ORAL | 0 refills | Status: DC | PRN
  Filled 2022-03-05: qty 30, 30d supply, fill #0

## 2022-03-05 NOTE — Telephone Encounter (Signed)
Nurses-these issues are currently being managed by [name] please forward this to [facility]

## 2022-03-08 ENCOUNTER — Other Ambulatory Visit: Payer: Self-pay

## 2022-03-12 ENCOUNTER — Other Ambulatory Visit: Payer: Self-pay

## 2022-03-20 ENCOUNTER — Encounter: Payer: Self-pay | Admitting: Neurology

## 2022-03-22 ENCOUNTER — Other Ambulatory Visit (HOSPITAL_COMMUNITY): Payer: Self-pay

## 2022-03-22 ENCOUNTER — Telehealth: Payer: Self-pay

## 2022-03-22 ENCOUNTER — Encounter (HOSPITAL_COMMUNITY): Payer: Self-pay | Admitting: Pharmacist

## 2022-03-22 ENCOUNTER — Other Ambulatory Visit: Payer: Self-pay | Admitting: Neurology

## 2022-03-22 MED ORDER — UBRELVY 100 MG PO TABS
1.0000 | ORAL_TABLET | ORAL | 11 refills | Status: DC | PRN
  Filled 2022-03-22 (×2): qty 16, 30d supply, fill #0
  Filled 2022-05-11 – 2022-05-12 (×2): qty 16, 30d supply, fill #1
  Filled 2022-07-11: qty 16, 30d supply, fill #2
  Filled 2022-08-09: qty 16, 30d supply, fill #3
  Filled 2022-10-11: qty 16, 30d supply, fill #4
  Filled 2022-11-08: qty 16, 30d supply, fill #5
  Filled 2022-12-19: qty 16, 30d supply, fill #6
  Filled 2023-02-20: qty 16, 30d supply, fill #7
  Filled 2023-03-13: qty 16, 30d supply, fill #8

## 2022-03-22 MED ORDER — TYRVAYA 0.03 MG/ACT NA SOLN
NASAL | 12 refills | Status: DC
  Filled 2022-03-22: qty 8.4, 30d supply, fill #0
  Filled 2022-04-19 – 2022-05-03 (×2): qty 8.4, 30d supply, fill #1

## 2022-03-22 NOTE — Telephone Encounter (Signed)
Per Mychart message from Patient,  I just got this message from Wilma Flavin, [facility] 11:20 AM Your provider sent in a prescription for Ubrelvy. It requires a prior authorization from the MD. All the information they need to complete this was sent to them this morning. Usually takes between 3-7 days for them to complete but feel free to follow up with them. Thank you.   Pa team please start a PA for Ubrelvy 100 mg.

## 2022-03-22 NOTE — Telephone Encounter (Signed)
Patient needs a new PA for Urblevy under her new insurance.

## 2022-03-23 ENCOUNTER — Other Ambulatory Visit (HOSPITAL_COMMUNITY): Payer: Self-pay

## 2022-03-29 ENCOUNTER — Other Ambulatory Visit (HOSPITAL_COMMUNITY): Payer: Self-pay

## 2022-03-29 ENCOUNTER — Other Ambulatory Visit: Payer: Self-pay

## 2022-03-29 NOTE — Telephone Encounter (Signed)
Patient Advocate Encounter  Prior Authorization for [name] '100MG'$  tablets has been approved.    PA# [id number] Effective dates: 03/29/2022 through 09/26/2022      Lyndel Safe, [place] Patient Advocate Specialist [place] Patient Advocate Team Direct Number: [phone]  Fax: [phone]

## 2022-03-29 NOTE — Telephone Encounter (Signed)
Patient Advocate Encounter   Received notification that prior authorization for Ubrelvy '100MG'$  tablets is required.   PA submitted on 03/29/2022 Key [id number] Status is pending       Lyndel Safe, [place] Patient Advocate Specialist [place] Patient Advocate Team Direct Number: [phone]  Fax: [phone]

## 2022-03-30 ENCOUNTER — Other Ambulatory Visit (HOSPITAL_COMMUNITY): Payer: Self-pay

## 2022-04-13 ENCOUNTER — Other Ambulatory Visit (HOSPITAL_COMMUNITY): Payer: Self-pay

## 2022-04-19 ENCOUNTER — Other Ambulatory Visit: Payer: Self-pay | Admitting: Family Medicine

## 2022-04-20 ENCOUNTER — Other Ambulatory Visit: Payer: Self-pay

## 2022-04-20 ENCOUNTER — Other Ambulatory Visit (HOSPITAL_COMMUNITY): Payer: Self-pay

## 2022-04-20 MED ORDER — CYANOCOBALAMIN 1000 MCG/ML IJ SOLN
1000.0000 ug | INTRAMUSCULAR | 4 refills | Status: DC
  Filled 2022-04-20: qty 3, 90d supply, fill #0
  Filled 2022-07-11: qty 3, 90d supply, fill #1
  Filled 2022-10-11: qty 3, 90d supply, fill #2
  Filled 2023-01-06: qty 3, 90d supply, fill #3
  Filled 2023-03-30: qty 3, 90d supply, fill #4

## 2022-04-22 ENCOUNTER — Encounter: Payer: Self-pay | Admitting: Radiology

## 2022-04-22 ENCOUNTER — Other Ambulatory Visit (HOSPITAL_COMMUNITY): Payer: Self-pay

## 2022-04-22 ENCOUNTER — Other Ambulatory Visit: Payer: Self-pay | Admitting: Nurse Practitioner

## 2022-04-23 ENCOUNTER — Other Ambulatory Visit (HOSPITAL_BASED_OUTPATIENT_CLINIC_OR_DEPARTMENT_OTHER): Payer: Self-pay

## 2022-04-23 ENCOUNTER — Other Ambulatory Visit: Payer: Self-pay

## 2022-04-23 MED ORDER — ALPRAZOLAM 0.5 MG PO TABS
0.2500 mg | ORAL_TABLET | Freq: Two times a day (BID) | ORAL | 0 refills | Status: DC | PRN
  Filled 2022-04-23: qty 60, 30d supply, fill #0

## 2022-04-30 ENCOUNTER — Other Ambulatory Visit (HOSPITAL_COMMUNITY): Payer: Self-pay

## 2022-05-03 ENCOUNTER — Other Ambulatory Visit (HOSPITAL_COMMUNITY): Payer: Self-pay

## 2022-05-03 ENCOUNTER — Other Ambulatory Visit: Payer: Self-pay

## 2022-05-10 ENCOUNTER — Other Ambulatory Visit (HOSPITAL_COMMUNITY): Payer: Self-pay

## 2022-05-11 ENCOUNTER — Other Ambulatory Visit (HOSPITAL_COMMUNITY): Payer: Self-pay

## 2022-05-11 ENCOUNTER — Other Ambulatory Visit: Payer: Self-pay

## 2022-05-11 ENCOUNTER — Other Ambulatory Visit: Payer: Self-pay | Admitting: Family Medicine

## 2022-05-12 ENCOUNTER — Other Ambulatory Visit: Payer: Self-pay

## 2022-05-12 MED ORDER — ALLOPURINOL 300 MG PO TABS
300.0000 mg | ORAL_TABLET | Freq: Two times a day (BID) | ORAL | 0 refills | Status: DC
  Filled 2022-05-12: qty 180, 90d supply, fill #0

## 2022-05-13 ENCOUNTER — Other Ambulatory Visit: Payer: Self-pay

## 2022-05-13 ENCOUNTER — Other Ambulatory Visit (HOSPITAL_COMMUNITY): Payer: Self-pay

## 2022-05-14 ENCOUNTER — Other Ambulatory Visit: Payer: Self-pay

## 2022-05-18 ENCOUNTER — Other Ambulatory Visit (HOSPITAL_COMMUNITY): Payer: Self-pay

## 2022-05-21 ENCOUNTER — Ambulatory Visit: Payer: Commercial Managed Care - PPO | Admitting: Nurse Practitioner

## 2022-05-23 ENCOUNTER — Other Ambulatory Visit (HOSPITAL_COMMUNITY): Payer: Self-pay

## 2022-05-24 ENCOUNTER — Other Ambulatory Visit (HOSPITAL_COMMUNITY): Payer: Self-pay

## 2022-05-26 ENCOUNTER — Other Ambulatory Visit (HOSPITAL_COMMUNITY): Payer: Self-pay

## 2022-05-26 ENCOUNTER — Other Ambulatory Visit: Payer: Self-pay

## 2022-05-26 MED ORDER — TYRVAYA 0.03 MG/ACT NA SOLN
NASAL | 3 refills | Status: DC
  Filled 2022-05-26: qty 25.2, 90d supply, fill #0
  Filled 2022-06-07 – 2022-06-15 (×2): qty 25.2, 30d supply, fill #0
  Filled 2022-06-15: qty 25.2, 90d supply, fill #0
  Filled 2022-09-01: qty 25.2, 90d supply, fill #1
  Filled 2022-11-23: qty 25.2, 90d supply, fill #2
  Filled 2023-02-20: qty 25.2, 90d supply, fill #3

## 2022-05-27 ENCOUNTER — Encounter: Payer: Self-pay | Admitting: Nurse Practitioner

## 2022-05-27 ENCOUNTER — Other Ambulatory Visit (HOSPITAL_COMMUNITY): Payer: Self-pay

## 2022-05-27 ENCOUNTER — Other Ambulatory Visit: Payer: Self-pay

## 2022-05-28 ENCOUNTER — Other Ambulatory Visit: Payer: Self-pay | Admitting: Nurse Practitioner

## 2022-05-28 DIAGNOSIS — K760 Fatty (change of) liver, not elsewhere classified: Secondary | ICD-10-CM

## 2022-05-28 DIAGNOSIS — E782 Mixed hyperlipidemia: Secondary | ICD-10-CM

## 2022-05-28 DIAGNOSIS — Z1329 Encounter for screening for other suspected endocrine disorder: Secondary | ICD-10-CM

## 2022-05-28 DIAGNOSIS — R748 Abnormal levels of other serum enzymes: Secondary | ICD-10-CM

## 2022-05-28 DIAGNOSIS — E1142 Type 2 diabetes mellitus with diabetic polyneuropathy: Secondary | ICD-10-CM

## 2022-05-28 DIAGNOSIS — I10 Essential (primary) hypertension: Secondary | ICD-10-CM

## 2022-05-28 DIAGNOSIS — E538 Deficiency of other specified B group vitamins: Secondary | ICD-10-CM

## 2022-05-31 ENCOUNTER — Other Ambulatory Visit (HOSPITAL_COMMUNITY): Payer: Self-pay

## 2022-06-01 ENCOUNTER — Other Ambulatory Visit: Payer: Self-pay

## 2022-06-01 ENCOUNTER — Telehealth: Payer: Self-pay | Admitting: Family Medicine

## 2022-06-01 ENCOUNTER — Other Ambulatory Visit (HOSPITAL_COMMUNITY): Payer: Self-pay

## 2022-06-01 DIAGNOSIS — I10 Essential (primary) hypertension: Secondary | ICD-10-CM | POA: Diagnosis not present

## 2022-06-01 DIAGNOSIS — E538 Deficiency of other specified B group vitamins: Secondary | ICD-10-CM | POA: Diagnosis not present

## 2022-06-01 DIAGNOSIS — Z1329 Encounter for screening for other suspected endocrine disorder: Secondary | ICD-10-CM | POA: Diagnosis not present

## 2022-06-01 DIAGNOSIS — K760 Fatty (change of) liver, not elsewhere classified: Secondary | ICD-10-CM | POA: Diagnosis not present

## 2022-06-01 DIAGNOSIS — E782 Mixed hyperlipidemia: Secondary | ICD-10-CM | POA: Diagnosis not present

## 2022-06-01 DIAGNOSIS — R748 Abnormal levels of other serum enzymes: Secondary | ICD-10-CM | POA: Diagnosis not present

## 2022-06-01 DIAGNOSIS — E1142 Type 2 diabetes mellitus with diabetic polyneuropathy: Secondary | ICD-10-CM | POA: Diagnosis not present

## 2022-06-01 NOTE — Telephone Encounter (Signed)
May have 2 refills 

## 2022-06-01 NOTE — Telephone Encounter (Signed)
Patient is requesting Programmer, multimedia from [employer]

## 2022-06-02 LAB — COMPREHENSIVE METABOLIC PANEL
ALT: 31 IU/L (ref 0–32)
AST: 45 IU/L — ABNORMAL HIGH (ref 0–40)
Albumin/Globulin Ratio: 2 (ref 1.2–2.2)
Albumin: 4.6 g/dL (ref 3.8–4.9)
Alkaline Phosphatase: 75 IU/L (ref 44–121)
BUN/Creatinine Ratio: 11 (ref 9–23)
BUN: 8 mg/dL (ref 6–24)
Bilirubin Total: 0.4 mg/dL (ref 0.0–1.2)
CO2: 27 mmol/L (ref 20–29)
Calcium: 9.8 mg/dL (ref 8.7–10.2)
Chloride: 97 mmol/L (ref 96–106)
Creatinine, Ser: 0.72 mg/dL (ref 0.57–1.00)
Globulin, Total: 2.3 g/dL (ref 1.5–4.5)
Glucose: 118 mg/dL — ABNORMAL HIGH (ref 70–99)
Potassium: 4.6 mmol/L (ref 3.5–5.2)
Sodium: 139 mmol/L (ref 134–144)
Total Protein: 6.9 g/dL (ref 6.0–8.5)
eGFR: 101 mL/min/{1.73_m2} (ref >59)

## 2022-06-02 LAB — CBC WITH DIFFERENTIAL/PLATELET
Basophils Absolute: 0.1 10*3/uL (ref 0.0–0.2)
Basos: 1 %
EOS (ABSOLUTE): 0.2 10*3/uL (ref 0.0–0.4)
Eos: 3 %
Hematocrit: 38.7 % (ref 34.0–46.6)
Hemoglobin: 12.3 g/dL (ref 11.1–15.9)
Immature Grans (Abs): 0 10*3/uL (ref 0.0–0.1)
Immature Granulocytes: 0 %
Lymphocytes Absolute: 2.4 10*3/uL (ref 0.7–3.1)
Lymphs: 39 %
MCH: 26.1 pg — ABNORMAL LOW (ref 26.6–33.0)
MCHC: 31.8 g/dL (ref 31.5–35.7)
MCV: 82 fL (ref 79–97)
Monocytes Absolute: 0.4 10*3/uL (ref 0.1–0.9)
Monocytes: 6 %
Neutrophils Absolute: 3.2 10*3/uL (ref 1.4–7.0)
Neutrophils: 51 %
Platelets: 412 10*3/uL (ref 150–450)
RBC: 4.71 x10E6/uL (ref 3.77–5.28)
RDW: 13.3 % (ref 11.7–15.4)
WBC: 6.2 10*3/uL (ref 3.4–10.8)

## 2022-06-02 LAB — MICROALBUMIN / CREATININE URINE RATIO
Creatinine, Urine: 107.1 mg/dL
Microalb/Creat Ratio: 13 mg/g creat (ref 0–29)
Microalbumin, Urine: 14 ug/mL

## 2022-06-02 LAB — LIPID PANEL
Chol/HDL Ratio: 3.5 ratio (ref 0.0–4.4)
Cholesterol, Total: 132 mg/dL (ref 100–199)
HDL: 38 mg/dL — ABNORMAL LOW (ref >39)
LDL Chol Calc (NIH): 69 mg/dL (ref 0–99)
Triglycerides: 141 mg/dL (ref 0–149)
VLDL Cholesterol Cal: 25 mg/dL (ref 5–40)

## 2022-06-02 LAB — TSH: TSH: 2.19 u[IU]/mL (ref 0.450–4.500)

## 2022-06-02 LAB — HEMOGLOBIN A1C
Est. average glucose Bld gHb Est-mCnc: 143 mg/dL
Hgb A1c MFr Bld: 6.6 % — ABNORMAL HIGH (ref 4.8–5.6)

## 2022-06-02 MED ORDER — NYSTATIN 100000 UNIT/ML MT SUSP: OROMUCOSAL | 2 refills | Status: DC

## 2022-06-02 NOTE — Telephone Encounter (Signed)
Refilled and message sent to patient.

## 2022-06-04 ENCOUNTER — Ambulatory Visit: Payer: Commercial Managed Care - PPO | Admitting: Nurse Practitioner

## 2022-06-04 ENCOUNTER — Encounter: Payer: Self-pay | Admitting: Nurse Practitioner

## 2022-06-04 VITALS — BP 106/72 | HR 115 | Ht 66.0 in | Wt 171.4 lb

## 2022-06-04 DIAGNOSIS — E782 Mixed hyperlipidemia: Secondary | ICD-10-CM

## 2022-06-04 DIAGNOSIS — M1A0720 Idiopathic chronic gout, left ankle and foot, without tophus (tophi): Secondary | ICD-10-CM | POA: Diagnosis not present

## 2022-06-04 DIAGNOSIS — F419 Anxiety disorder, unspecified: Secondary | ICD-10-CM

## 2022-06-04 DIAGNOSIS — G629 Polyneuropathy, unspecified: Secondary | ICD-10-CM

## 2022-06-04 DIAGNOSIS — E1142 Type 2 diabetes mellitus with diabetic polyneuropathy: Secondary | ICD-10-CM

## 2022-06-04 DIAGNOSIS — E65 Localized adiposity: Secondary | ICD-10-CM

## 2022-06-04 DIAGNOSIS — J302 Other seasonal allergic rhinitis: Secondary | ICD-10-CM

## 2022-06-04 DIAGNOSIS — K219 Gastro-esophageal reflux disease without esophagitis: Secondary | ICD-10-CM | POA: Diagnosis not present

## 2022-06-04 DIAGNOSIS — F5104 Psychophysiologic insomnia: Secondary | ICD-10-CM | POA: Diagnosis not present

## 2022-06-04 DIAGNOSIS — E1165 Type 2 diabetes mellitus with hyperglycemia: Secondary | ICD-10-CM

## 2022-06-04 MED ORDER — PANTOPRAZOLE SODIUM 40 MG PO TBEC
40.0000 mg | DELAYED_RELEASE_TABLET | Freq: Two times a day (BID) | ORAL | 1 refills | Status: DC
Start: 2022-06-04 — End: 2023-02-20
  Filled 2022-06-04 – 2022-08-30 (×4): qty 180, 90d supply, fill #0
  Filled 2022-11-23: qty 180, 90d supply, fill #1

## 2022-06-04 MED ORDER — ALPRAZOLAM 0.5 MG PO TABS
0.2500 mg | ORAL_TABLET | Freq: Two times a day (BID) | ORAL | 2 refills | Status: DC | PRN
Start: 2022-06-04 — End: 2022-10-03
  Filled 2022-06-04: qty 60, 30d supply, fill #0
  Filled 2022-07-11: qty 60, 30d supply, fill #1
  Filled 2022-08-09 – 2022-08-30 (×3): qty 60, 30d supply, fill #2

## 2022-06-04 MED ORDER — FAMOTIDINE 40 MG PO TABS
40.0000 mg | ORAL_TABLET | Freq: Every day | ORAL | 1 refills | Status: DC
Start: 2022-06-04 — End: 2023-02-20
  Filled 2022-06-04 – 2022-08-30 (×3): qty 90, 90d supply, fill #0
  Filled 2022-11-23: qty 90, 90d supply, fill #1

## 2022-06-04 MED ORDER — LIDOCAINE-PRILOCAINE 2.5-2.5 % EX CREA
TOPICAL_CREAM | CUTANEOUS | 2 refills | Status: DC
Start: 2022-06-04 — End: 2022-10-20
  Filled 2022-06-04: qty 30, fill #0
  Filled 2022-06-15 – 2022-07-11 (×2): qty 30, 30d supply, fill #0
  Filled 2022-08-09 – 2022-08-30 (×3): qty 30, 30d supply, fill #1
  Filled 2022-09-23: qty 30, 30d supply, fill #2

## 2022-06-04 MED ORDER — OMEGA-3-ACID ETHYL ESTERS 1 G PO CAPS
2.0000 | ORAL_CAPSULE | Freq: Two times a day (BID) | ORAL | 1 refills | Status: AC
Start: 2022-06-04 — End: ?
  Filled 2022-06-04 – 2022-06-15 (×3): qty 360, 90d supply, fill #0
  Filled 2022-09-05: qty 360, 90d supply, fill #1

## 2022-06-04 MED ORDER — CYCLOBENZAPRINE HCL 10 MG PO TABS
10.0000 mg | ORAL_TABLET | Freq: Every day | ORAL | 0 refills | Status: DC | PRN
Start: 2022-06-04 — End: 2022-10-11
  Filled 2022-06-04: qty 90, 90d supply, fill #0

## 2022-06-04 MED ORDER — ALLOPURINOL 300 MG PO TABS
300.0000 mg | ORAL_TABLET | Freq: Two times a day (BID) | ORAL | 1 refills | Status: AC
Start: 2022-06-04 — End: ?
  Filled 2022-06-04 – 2022-12-26 (×2): qty 180, 90d supply, fill #0
  Filled 2023-05-04: qty 180, 90d supply, fill #1

## 2022-06-04 MED ORDER — ROSUVASTATIN CALCIUM 10 MG PO TABS
10.0000 mg | ORAL_TABLET | Freq: Every day | ORAL | 1 refills | Status: DC
Start: 2022-06-04 — End: 2023-03-14
  Filled 2022-06-04 – 2022-09-05 (×2): qty 90, 90d supply, fill #0
  Filled 2022-11-23: qty 90, 90d supply, fill #1

## 2022-06-04 MED ORDER — MONTELUKAST SODIUM 10 MG PO TABS
10.0000 mg | ORAL_TABLET | Freq: Every day | ORAL | 1 refills | Status: DC
Start: 2022-06-04 — End: 2022-09-08
  Filled 2022-06-04: qty 90, 90d supply, fill #0

## 2022-06-04 MED ORDER — AMITRIPTYLINE HCL 50 MG PO TABS
50.0000 mg | ORAL_TABLET | Freq: Every day | ORAL | 1 refills | Status: DC
Start: 2022-06-04 — End: 2022-06-18
  Filled 2022-06-04: qty 90, 90d supply, fill #0

## 2022-06-04 MED ORDER — MOUNJARO 15 MG/0.5ML ~~LOC~~ SOAJ
15.0000 mg | SUBCUTANEOUS | 1 refills | Status: DC
  Filled 2022-06-04 – 2022-06-07 (×2): qty 6, 84d supply, fill #0
  Filled 2022-09-05: qty 6, 84d supply, fill #1

## 2022-06-04 MED ORDER — METFORMIN HCL 1000 MG PO TABS
1000.0000 mg | ORAL_TABLET | Freq: Two times a day (BID) | ORAL | 1 refills | Status: DC
Start: 2022-06-04 — End: 2023-01-06
  Filled 2022-06-04 – 2022-07-11 (×2): qty 180, 90d supply, fill #0
  Filled 2022-10-11: qty 180, 90d supply, fill #1

## 2022-06-04 NOTE — Progress Notes (Unsigned)
Subjective:    Patient ID: [name], female    DOB: Jul 25, 1970, 52 y.o.   MRN: [id number]  HPI Patient arrives today for 3 month follow up refill on all medications and go over labs. Presents today for recheck on her chronic health issues.  Has done better with her diet.  Has increased her water intake.  Limits herself to 1 regular Coke per day.  Limited activity now but plans to hire a personal trainer to help her increase her activity.  Note that patient has had a complete GI workup.  Takes alprazolam low-dose up to twice per day as needed for stress and anxiety.  Mainly takes at nighttime for sleep and a second 1 during the day if needed.  Remains under tremendous stress due to the loss of her father and family issues involving his estate.  Continues to take her pantoprazole 1 twice a day as needed.  Requesting a refill on her ibuprofen today but advised patient she should limit all NSAID use due to her history of GERD.  Verbalizes understanding.  Gets regular preventive health physicals and mammograms with her gynecologist, her next appointment is 4/28.  Had her eye exam back in November.  Continues to work on weight loss.  Also had recent labs for review. Patient states she placed a freestyle libre 3 sensor on her arm but applied it to the wrong place and was not getting accurate measurements. Adherent to medication regimen see med list.   Review of Systems  Constitutional:  Positive for fatigue.  HENT:  Negative for sore throat and trouble swallowing.   Respiratory:  Negative for cough, chest tightness, shortness of breath and wheezing.   Cardiovascular:  Negative for chest pain and leg swelling.  Gastrointestinal:  Negative for abdominal pain, blood in stool, nausea and vomiting.  Neurological:  Positive for numbness.       Chronic numbness in both feet.  Some relief with gabapentin and amitriptyline.         Objective:   Physical Exam NAD.  Alert, oriented.  Thyroid nontender  to palpation, no mass or goiter noted.  Lungs clear.  Heart regular rate rhythm.  Abdomen soft nondistended nontender.  Significant central obesity noted but this has improved over time. Today's Vitals   06/04/22 1312  BP: 106/72  Pulse: (!) 115  SpO2: 99%  Weight: 171 lb 6.4 oz (77.7 kg)  Height:  (1.676 m)  Has lost another 3 pounds since her last visit. Body mass index is 27.66 kg/m. Results for orders placed or performed in visit on 05/28/22  CBC with Differential/Platelet  Result Value Ref Range   WBC 6.2 3.4 - 10.8 x10E3/uL   RBC 4.71 3.77 - 5.28 x10E6/uL   Hemoglobin 12.3 11.1 - 15.9 g/dL   Hematocrit 40.9 81.1 - 46.6 %   MCV 82 79 - 97 fL   MCH 26.1 (L) 26.6 - 33.0 pg   MCHC 31.8 31.5 - 35.7 g/dL   RDW 91.4 78.2 - 95.6 %   Platelets 412 150 - 450 x10E3/uL   Neutrophils 51 Not Estab. %   Lymphs 39 Not Estab. %   Monocytes 6 Not Estab. %   Eos 3 Not Estab. %   Basos 1 Not Estab. %   Neutrophils Absolute 3.2 1.4 - 7.0 x10E3/uL   Lymphocytes Absolute 2.4 0.7 - 3.1 x10E3/uL   Monocytes Absolute 0.4 0.1 - 0.9 x10E3/uL   EOS (ABSOLUTE) 0.2 0.0 - 0.4 x10E3/uL  Basophils Absolute 0.1 0.0 - 0.2 x10E3/uL   Immature Granulocytes 0 Not Estab. %   Immature Grans (Abs) 0.0 0.0 - 0.1 x10E3/uL  Comprehensive metabolic panel  Result Value Ref Range   Glucose 118 (H) 70 - 99 mg/dL   BUN 8 6 - 24 mg/dL   Creatinine, Ser 0.63 0.57 - 1.00 mg/dL   eGFR 868 >54 IS/NGX/4.15   BUN/Creatinine Ratio 11 9 - 23   Sodium 139 134 - 144 mmol/L   Potassium 4.6 3.5 - 5.2 mmol/L   Chloride 97 96 - 106 mmol/L   CO2 27 20 - 29 mmol/L   Calcium 9.8 8.7 - 10.2 mg/dL   Total Protein 6.9 6.0 - 8.5 g/dL   Albumin 4.6 3.8 - 4.9 g/dL   Globulin, Total 2.3 1.5 - 4.5 g/dL   Albumin/Globulin Ratio 2.0 1.2 - 2.2   Bilirubin Total 0.4 0.0 - 1.2 mg/dL   Alkaline Phosphatase 75 44 - 121 IU/L   AST 45 (H) 0 - 40 IU/L   ALT 31 0 - 32 IU/L  Hemoglobin A1c  Result Value Ref Range   Hgb A1c MFr Bld 6.6  (H) 4.8 - 5.6 %   Est. average glucose Bld gHb Est-mCnc 143 mg/dL  Lipid panel  Result Value Ref Range   Cholesterol, Total 132 100 - 199 mg/dL   Triglycerides 973 0 - 149 mg/dL   HDL 38 (L) >31 mg/dL   VLDL Cholesterol Cal 25 5 - 40 mg/dL   LDL Chol Calc (NIH) 69 0 - 99 mg/dL   Chol/HDL Ratio 3.5 0.0 - 4.4 ratio  TSH  Result Value Ref Range   TSH 2.190 0.450 - 4.500 uIU/mL  Microalbumin/Creatinine Ratio, Urine  Result Value Ref Range   Creatinine, Urine 107.1 Not Estab. mg/dL   Microalbumin, Urine 25.0 Not Estab. ug/mL   Microalb/Creat Ratio 13 0 - 29 mg/g creat     Diabetic Foot Exam - Simple   Simple Foot Form Visual Inspection No deformities, no ulcerations, no other skin breakdown bilaterally: Yes See comments: Yes Sensation Testing See comments: Yes Pulse Check Posterior Tibialis and Dorsalis pulse intact bilaterally: Yes Comments Decreased sensitivity in all toes bilaterally         Assessment & Plan:  1. Seasonal allergic rhinitis, unspecified trigger Continue Singulair and other OTC meds as directed for allergy symptoms. - montelukast (SINGULAIR) 10 MG tablet; Take 1 tablet (10 mg total) by mouth daily.  Dispense: 90 tablet; Refill: 1  2. Gastroesophageal reflux disease without esophagitis Continue famotidine and pantoprazole as directed as needed reflux.  Advised patient to use Tylenol for pain and avoid NSAIDs is much as possible. - famotidine (PEPCID) 40 MG tablet; Take 1 tablet (40 mg total) by mouth at bedtime.  Dispense: 90 tablet; Refill: 1 - pantoprazole (PROTONIX) 40 MG tablet; Take 1 tablet (40 mg total) by mouth 2 (two) times daily before a meal.  Dispense: 180 tablet; Refill: 1  3. Type 2 diabetes mellitus with diabetic polyneuropathy, without long-term current use of insulin Continue metformin as directed.  Continue Mounjaro as directed.  Continue to limit sugar and simple carbs in her diet.  Reviewed labs with patient today.  Labs are stable. -  metFORMIN (GLUCOPHAGE) 1000 MG tablet; Take 1 tablet (1,000 mg total) by mouth 2 (two) times daily before a meal.  Dispense: 180 tablet; Refill: 1  4. Peripheral polyneuropathy Continue amitriptyline cyclobenzaprine and E MLA cream as directed for nerve pain and spasms. - amitriptyline (  ELAVIL) 50 MG tablet; Take 1 tablet (50 mg total) by mouth at bedtime.  Dispense: 90 tablet; Refill: 1 - cyclobenzaprine (FLEXERIL) 10 MG tablet; Take 1 tablet (10 mg total) by mouth daily as needed for muscle spasms.  Dispense: 90 tablet; Refill: 0 - lidocaine-prilocaine (EMLA) cream; Apply to feet as needed for nerve pain  Dispense: 30 g; Refill: 2  5. Chronic idiopathic gout involving toe of left foot without tophus Continue allopurinol, gout has been stable. - allopurinol (ZYLOPRIM) 300 MG tablet; Take 1 tablet (300 mg total) by mouth 2 (two) times daily.  Dispense: 180 tablet; Refill: 1  6. Anxiety Continue Xanax as directed. - ALPRAZolam (XANAX) 0.5 MG tablet; Take 0.5-1 tablets (0.25-0.5 mg total) by mouth 2 (two) times daily as needed for anxiety.  Dispense: 60 tablet; Refill: 2  7. Central obesity Patient continues to lose weight slowly over time particular around the mid abdominal area.  Encouraged her to start activity as planned.  8. Mixed hyperlipidemia Continue Lovaza and rosuvastatin as directed. - omega-3 acid ethyl esters (LOVAZA) 1 g capsule; TAKE 2 CAPSULES BY MOUTH TWO TIMES DAILY  Dispense: 360 capsule; Refill: 1 - rosuvastatin (CRESTOR) 10 MG tablet; Take 1 tablet (10 mg total) by mouth at bedtime.  Dispense: 90 tablet; Refill: 1  9. Chronic insomnia Continue low-dose Xanax at bedtime as needed for sleep. - ALPRAZolam (XANAX) 0.5 MG tablet; Take 0.5-1 tablets (0.25-0.5 mg total) by mouth 2 (two) times daily as needed for anxiety.  Dispense: 60 tablet; Refill: 2  Patient plans to get her wellness exam done in April including her mammogram.  All of her other recommended health  maintenance for her age have been completed. Return in about 3 months (around 09/03/2022).

## 2022-06-07 ENCOUNTER — Other Ambulatory Visit: Payer: Self-pay

## 2022-06-07 ENCOUNTER — Other Ambulatory Visit (HOSPITAL_COMMUNITY): Payer: Self-pay

## 2022-06-07 ENCOUNTER — Encounter: Payer: Self-pay | Admitting: Family Medicine

## 2022-06-08 ENCOUNTER — Other Ambulatory Visit: Payer: Self-pay

## 2022-06-08 ENCOUNTER — Other Ambulatory Visit (HOSPITAL_COMMUNITY): Payer: Self-pay

## 2022-06-08 MED ORDER — FREESTYLE LANCETS MISC
1.0000 | Freq: Three times a day (TID) | 0 refills | Status: AC
  Filled 2022-06-08: qty 100, 33d supply, fill #0

## 2022-06-08 MED ORDER — LANCET DEVICE MISC
1.0000 | Freq: Three times a day (TID) | 0 refills | Status: AC
  Filled 2022-06-08: qty 1, 30d supply, fill #0

## 2022-06-08 MED ORDER — BLOOD GLUCOSE TEST VI STRP
1.0000 | ORAL_STRIP | Freq: Three times a day (TID) | 0 refills | Status: AC
  Filled 2022-06-08 – 2022-06-09 (×2): qty 100, 34d supply, fill #0

## 2022-06-08 MED ORDER — BLOOD GLUCOSE MONITOR SYSTEM W/DEVICE KIT
1.0000 | PACK | Freq: Three times a day (TID) | 0 refills | Status: DC
  Filled 2022-06-08: qty 1, 30d supply, fill #0

## 2022-06-09 ENCOUNTER — Other Ambulatory Visit (HOSPITAL_COMMUNITY): Payer: Self-pay

## 2022-06-09 ENCOUNTER — Other Ambulatory Visit: Payer: Self-pay

## 2022-06-10 ENCOUNTER — Other Ambulatory Visit (HOSPITAL_COMMUNITY): Payer: Self-pay

## 2022-06-10 ENCOUNTER — Other Ambulatory Visit: Payer: Self-pay

## 2022-06-14 ENCOUNTER — Encounter: Payer: Self-pay | Admitting: Nurse Practitioner

## 2022-06-15 ENCOUNTER — Other Ambulatory Visit (HOSPITAL_COMMUNITY): Payer: Self-pay

## 2022-06-15 ENCOUNTER — Other Ambulatory Visit (HOSPITAL_BASED_OUTPATIENT_CLINIC_OR_DEPARTMENT_OTHER): Payer: Self-pay

## 2022-06-16 ENCOUNTER — Other Ambulatory Visit: Payer: Self-pay

## 2022-06-16 NOTE — Progress Notes (Signed)
NEUROLOGY FOLLOW UP OFFICE NOTE  [name] [id number]  Assessment/Plan:   Hemiplegic migraine.  TIA felt to be less likely.  She does have stroke risk factors such as high cholesterol and diabetes, but based on age, associated headache and normal MRI without any evidence of chronic microvascular ischemic changes, migraine more likely Migraine without aura, without status migrainosus, not intractable Diabetic polyneuropathy   Still need to remain on ASA 81mg  daily and optimize management of stroke risk factors as per PCP:  Hgb A1c goal less than 7 and LDL goal less than 70 Increase amitriptyline to 75mg  at bedtime to treat both migraines and neuropathic pain. Migraine rescue:  Ubrelvy 100mg .  Triptans are contraindicated. Neuropathic pain: gabapentin to 800mg  three times daily, increase amitriptyline to 75mg  at bedtime Limit use of pain relievers to no more than 2 days out of week to prevent risk of rebound or medication-overuse headache. Keep headache diary Follow up 6 months       Subjective:  [name] is a 52 year old right-handed female with DM II, hypercholesterolemia, migraines and anxiety who follows up for migraines and neuropathy.  UPDATE: Ubrelvy effective.   Headaches frequent.  Pounding back of head or across forehead.  Usually right ptosis, phonophobia.  No nausea or photophobia. Intensity:  severe Duration:  2 hours with [name] Frequency:  2 days a week  IFE on 11/15 revealed no monoclonal protein.  Increased gabapentin.  Labs from 06/01/2022 revealed Hgb A1c 6.6 and TSH 2.190.  Increased gabapentin to 800mg  three times daily which helped.  This past week, her toes have been feeling more sensitive.  Using lidocaine cream.    LDL 69.   Current NSAIDS/analgesics:  ASA 81mg  daily Current triptans:  rizatriptan Current ergotamine:  none Current anti-emetic:  none Current muscle relaxants:  Flexeril 10mg  QD PRN Current Antihypertensive medications:   none Current Antidepressant medications:  amitriptyline 50mg  QHS Current Anticonvulsant medications:  gabapentin 800mg  three times daily (pain) Current anti-CGRP:  Ubrelvy 100mg  Other medications:  alprazolam PRN (sleep), rosuvastatin 10mg   HISTORY:  On 11/16/2021, she developed right sided facial droop, slurred speech, blurred vision and right sided upper and lower extremity numbness and weakness.  She also had severe pounding headache on top of her head.  She presented to [facility] where she was a code stroke and had an NIHSS of 5.  CT head showed no possible hypodensity in the posterior left temporal lobe, which was determined to be artifact.  She received TNK and was transferred to [facility].  Symptoms lasted no more than 2 to 3 hours.  CTA head and neck showed no LVO or hemodynamically significant stenosis.  MRI of brain showed no acute intracranial abnormality.  2D echo showed LVEF 65-70%.  LDL was 38 and Hgb A1c was 6.8.  Discharged on ASA 81mg  and Plavix 75mg  daily for 21 days followed by ASA 81mg  monotherapy.     She does have history of migraines which are manageable.  They are right sided associated with blurred vision and photophobia lasting 2 hours with rizatriptan and occurs no more than 2 days a month.   She also has diabetic polyneuropathy since at least 2019.  She endorses numbness and pain in the feet.  Underwent workup at that time.  Labs were unremarkable, including B12 >2000, MMA 138, folate 10.3, B1 142.3, B6 6.1, negative ANA, negative RF, sed rate 13, negative SSA/SSB antibodies, negative pan-ANCA panel, negative Lyme IgG/IgM, nonreactive RPR,  negative Hep C antibody, negative Celiac panel, negative heavy metals.  Hgb A1c increased in 2022 but has steadily declined from 7.5 to 6.8.     Past NSAIDS/analgesics:  naproxen, ibuprofen, diclofenac, indomethacin, acetaminophen Past abortive triptans:  sumatriptan Past anti-emetic:  Zofran Past antihypertensive  medications:  metoprolol, losartan, valsartan Past antidepressant medications:  sertraline, escitalopram       PAST MEDICAL HISTORY: Past Medical History:  Diagnosis Date   Allergy    Anxiety    Diabetes mellitus without complication    Gout    History of migraine headaches    Hx of migraines    Hypertension not at goal 03/26/2016   IBS (irritable bowel syndrome)    Impaired fasting glucose     MEDICATIONS: Current Outpatient Medications on File Prior to Visit  Medication Sig Dispense Refill   allopurinol (ZYLOPRIM) 300 MG tablet Take 1 tablet (300 mg total) by mouth 2 (two) times daily. 180 tablet 1   ALPRAZolam (XANAX) 0.5 MG tablet Take 0.5-1 tablets (0.25-0.5 mg total) by mouth 2 (two) times daily as needed for anxiety. 60 tablet 2   amitriptyline (ELAVIL) 50 MG tablet Take 1 tablet (50 mg total) by mouth at bedtime. 90 tablet 1   aspirin EC 81 MG tablet Take 1 tablet (81 mg total) by mouth daily. Swallow whole. 30 tablet 0   Blood Glucose Monitoring Suppl (BLOOD GLUCOSE MONITOR SYSTEM) w/Device KIT Use in the morning, at noon, and at bedtime. 1 kit 0   Continuous Glucose Sensor (FREESTYLE LIBRE 3 SENSOR) MISC Apply as directed every 14 days 6 each 1   cyanocobalamin (VITAMIN B12) 1000 MCG/ML injection Inject 1 mL into the muscle every 30 days. 3 mL 4   cyclobenzaprine (FLEXERIL) 10 MG tablet Take 1 tablet (10 mg total) by mouth daily as needed for muscle spasms. 90 tablet 0   famotidine (PEPCID) 40 MG tablet Take 1 tablet (40 mg total) by mouth at bedtime. 90 tablet 1   gabapentin (NEURONTIN) 800 MG tablet Take 1 tablet (800 mg total) by mouth 3 (three) times daily. 270 tablet 1   Glucose Blood (BLOOD GLUCOSE TEST STRIPS) STRP Use 1 each by In Vitro route in the morning, at noon, and at bedtime. 100 strip 0   glucose blood (FREESTYLE LITE) test strip Use twice daily as directed to check blood sugar 100 each 1   ibuprofen (ADVIL) 800 MG tablet Take 1 tablet (800 mg total) by  mouth every 8 (eight) hours as needed. 30 tablet 0   Lancet Device MISC 1 each by Does not apply route in the morning, at noon, and at bedtime. May substitute to any manufacturer covered by patient's insurance. 1 each 0   Lancets (FREESTYLE) lancets Use 1 each in the morning, at noon, and at bedtime. 100 each 0   lidocaine-prilocaine (EMLA) cream Apply to feet as needed for nerve pain 30 g 2   LOTEMAX SM 0.38 % GEL Place 1 Application into the left eye daily.     magic mouthwash (nystatin, hydrocortisone, diphenhydrAMINE) suspension Swish and spit 5 MLs by mouth  3 times a day as needed 540 mL 2   metFORMIN (GLUCOPHAGE) 1000 MG tablet Take 1 tablet (1,000 mg total) by mouth 2 (two) times daily before a meal. 180 tablet 1   montelukast (SINGULAIR) 10 MG tablet Take 1 tablet (10 mg total) by mouth daily. 90 tablet 1   omega-3 acid ethyl esters (LOVAZA) 1 g capsule TAKE 2 CAPSULES BY  MOUTH TWO TIMES DAILY 360 capsule 1   pantoprazole (PROTONIX) 40 MG tablet Take 1 tablet (40 mg total) by mouth 2 (two) times daily before a meal. 180 tablet 1   Perfluorohexyloctane (MIEBO) 1.338 GM/ML SOLN Place 1 drop in both eyes up to 4 times daily as directed. 3 mL PRN   rosuvastatin (CRESTOR) 10 MG tablet Take 1 tablet (10 mg total) by mouth at bedtime. 90 tablet 1   tirzepatide (MOUNJARO) 15 MG/0.5ML Pen Inject 15 mg into the skin once a week. 6 mL 1   Ubrogepant (UBRELVY) 100 MG TABS Take 1 tablet (100 mg total) by mouth as needed. May repeat after 2 hours.  Maximum 2 tablets in 24 hours. 16 tablet 11   Varenicline Tartrate (TYRVAYA) 0.03 MG/ACT SOLN Use 2 times a day as directed 8.4 mL 12   Varenicline Tartrate (TYRVAYA) 0.03 MG/ACT SOLN Use twice daily as directed. 25.2 mL 3   [DISCONTINUED] cetirizine (ZYRTEC) 10 MG tablet Take 1 tablet (10 mg total) by mouth daily. 90 tablet 3   No current facility-administered medications on file prior to visit.    ALLERGIES: Allergies  Allergen Reactions   Omnicef  [Cefdinir] Other (See Comments)    Patient states antibiotic does not work. Has tried multiple times.   Losartan Rash    Broke out on face only; no allergic reaction    FAMILY HISTORY: Family History  Problem Relation Age of Onset   Diabetes Mother    Neuropathy Mother    Heart failure Mother    Hypertension Mother    Cancer Mother    Atrial fibrillation Father    Hypertension Father    Colonic polyp Father       Objective:  Blood pressure 103/77, pulse (!) 107, height 5\' 6"  (1.676 m), weight 176 lb 9.6 oz (80.1 kg), SpO2 98 %. General: No acute distress.  Patient appears well   Shon Millet, DO  CC: Lilyan Punt, MD

## 2022-06-17 ENCOUNTER — Other Ambulatory Visit: Payer: Self-pay

## 2022-06-18 ENCOUNTER — Other Ambulatory Visit: Payer: Self-pay | Admitting: Nurse Practitioner

## 2022-06-18 ENCOUNTER — Ambulatory Visit: Payer: Commercial Managed Care - PPO | Admitting: Neurology

## 2022-06-18 ENCOUNTER — Other Ambulatory Visit (HOSPITAL_COMMUNITY): Payer: Self-pay

## 2022-06-18 ENCOUNTER — Other Ambulatory Visit: Payer: Self-pay

## 2022-06-18 ENCOUNTER — Encounter: Payer: Self-pay | Admitting: Neurology

## 2022-06-18 VITALS — BP 103/77 | HR 107 | Ht 66.0 in | Wt 176.6 lb

## 2022-06-18 DIAGNOSIS — G43009 Migraine without aura, not intractable, without status migrainosus: Secondary | ICD-10-CM

## 2022-06-18 DIAGNOSIS — E1142 Type 2 diabetes mellitus with diabetic polyneuropathy: Secondary | ICD-10-CM | POA: Diagnosis not present

## 2022-06-18 DIAGNOSIS — G43409 Hemiplegic migraine, not intractable, without status migrainosus: Secondary | ICD-10-CM

## 2022-06-18 MED ORDER — AMITRIPTYLINE HCL 75 MG PO TABS
75.0000 mg | ORAL_TABLET | Freq: Every day | ORAL | 5 refills | Status: DC
  Filled 2022-06-18: qty 30, 30d supply, fill #0
  Filled 2022-07-25: qty 30, 30d supply, fill #1
  Filled 2022-08-23 – 2022-08-30 (×2): qty 30, 30d supply, fill #2
  Filled 2022-09-23: qty 30, 30d supply, fill #3
  Filled 2022-11-06 – 2022-11-08 (×2): qty 30, 30d supply, fill #4

## 2022-06-18 NOTE — Patient Instructions (Signed)
Increase amitriptyline to 75mg  at bedtime.  We can increase dose in 2 months if needed Ubrelvy as needed

## 2022-06-21 DIAGNOSIS — Z0279 Encounter for issue of other medical certificate: Secondary | ICD-10-CM

## 2022-06-22 ENCOUNTER — Other Ambulatory Visit (HOSPITAL_BASED_OUTPATIENT_CLINIC_OR_DEPARTMENT_OTHER): Payer: Self-pay

## 2022-07-02 ENCOUNTER — Encounter: Payer: Self-pay | Admitting: Neurology

## 2022-07-02 ENCOUNTER — Telehealth: Payer: Self-pay | Admitting: Neurology

## 2022-07-02 NOTE — Telephone Encounter (Signed)
Sent mychart message

## 2022-07-02 NOTE — Telephone Encounter (Signed)
Pt called in and left a message. She is returning a call 

## 2022-07-07 NOTE — Telephone Encounter (Signed)
Pt called in stating [employer] has not gotten her FMLA paperwork yet and she has not gotten her copy in the mail.

## 2022-07-09 DIAGNOSIS — N959 Unspecified menopausal and perimenopausal disorder: Secondary | ICD-10-CM | POA: Diagnosis not present

## 2022-07-09 DIAGNOSIS — N952 Postmenopausal atrophic vaginitis: Secondary | ICD-10-CM | POA: Diagnosis not present

## 2022-07-09 DIAGNOSIS — Z6828 Body mass index (BMI) 28.0-28.9, adult: Secondary | ICD-10-CM | POA: Diagnosis not present

## 2022-07-09 DIAGNOSIS — Z1231 Encounter for screening mammogram for malignant neoplasm of breast: Secondary | ICD-10-CM | POA: Diagnosis not present

## 2022-07-09 DIAGNOSIS — Z01419 Encounter for gynecological examination (general) (routine) without abnormal findings: Secondary | ICD-10-CM | POA: Diagnosis not present

## 2022-07-09 DIAGNOSIS — N760 Acute vaginitis: Secondary | ICD-10-CM | POA: Diagnosis not present

## 2022-07-12 ENCOUNTER — Other Ambulatory Visit (HOSPITAL_COMMUNITY): Payer: Self-pay

## 2022-07-12 ENCOUNTER — Other Ambulatory Visit: Payer: Self-pay

## 2022-07-14 ENCOUNTER — Other Ambulatory Visit (HOSPITAL_COMMUNITY): Payer: Self-pay

## 2022-07-14 NOTE — Telephone Encounter (Signed)
Shailee message back for a follow up on message she never heard back

## 2022-07-16 ENCOUNTER — Other Ambulatory Visit (HOSPITAL_COMMUNITY): Payer: Self-pay

## 2022-07-26 ENCOUNTER — Other Ambulatory Visit: Payer: Self-pay

## 2022-07-26 ENCOUNTER — Other Ambulatory Visit (HOSPITAL_COMMUNITY): Payer: Self-pay

## 2022-07-30 ENCOUNTER — Other Ambulatory Visit (HOSPITAL_COMMUNITY): Payer: Self-pay

## 2022-07-30 ENCOUNTER — Other Ambulatory Visit: Payer: Self-pay

## 2022-07-30 ENCOUNTER — Emergency Department (HOSPITAL_COMMUNITY)
Admission: EM | Admit: 2022-07-30 | Discharge: 2022-07-30 | Disposition: A | Discharge status: Home / Self Care | Payer: Commercial Managed Care - PPO | Attending: Emergency Medicine | Admitting: Emergency Medicine

## 2022-07-30 DIAGNOSIS — T7840XA Allergy, unspecified, initial encounter: Secondary | ICD-10-CM | POA: Diagnosis not present

## 2022-07-30 DIAGNOSIS — R42 Dizziness and giddiness: Secondary | ICD-10-CM | POA: Diagnosis not present

## 2022-07-30 DIAGNOSIS — E119 Type 2 diabetes mellitus without complications: Secondary | ICD-10-CM | POA: Diagnosis not present

## 2022-07-30 DIAGNOSIS — Z7984 Long term (current) use of oral hypoglycemic drugs: Secondary | ICD-10-CM | POA: Diagnosis not present

## 2022-07-30 DIAGNOSIS — R220 Localized swelling, mass and lump, head: Secondary | ICD-10-CM | POA: Diagnosis present

## 2022-07-30 DIAGNOSIS — Z7982 Long term (current) use of aspirin: Secondary | ICD-10-CM | POA: Insufficient documentation

## 2022-07-30 LAB — CBC WITH DIFFERENTIAL/PLATELET
Abs Immature Granulocytes: 0.01 10*3/uL (ref 0.00–0.07)
Basophils Absolute: 0 10*3/uL (ref 0.0–0.1)
Basophils Relative: 1 %
Eosinophils Absolute: 0.1 10*3/uL (ref 0.0–0.5)
Eosinophils Relative: 2 %
HCT: 34.5 % — ABNORMAL LOW (ref 36.0–46.0)
Hemoglobin: 11 g/dL — ABNORMAL LOW (ref 12.0–15.0)
Immature Granulocytes: 0 %
Lymphocytes Relative: 40 %
Lymphs Abs: 2.4 10*3/uL (ref 0.7–4.0)
MCH: 27.2 pg (ref 26.0–34.0)
MCHC: 31.9 g/dL (ref 30.0–36.0)
MCV: 85.2 fL (ref 80.0–100.0)
Monocytes Absolute: 0.4 10*3/uL (ref 0.1–1.0)
Monocytes Relative: 7 %
Neutro Abs: 3 10*3/uL (ref 1.7–7.7)
Neutrophils Relative %: 50 %
Platelets: 318 10*3/uL (ref 150–400)
RBC: 4.05 MIL/uL (ref 3.87–5.11)
RDW: 14.4 % (ref 11.5–15.5)
WBC: 5.9 10*3/uL (ref 4.0–10.5)
nRBC: 0 % (ref 0.0–0.2)

## 2022-07-30 LAB — BASIC METABOLIC PANEL
Anion gap: 10 (ref 5–15)
BUN: 11 mg/dL (ref 6–20)
CO2: 29 mmol/L (ref 22–32)
Calcium: 9 mg/dL (ref 8.9–10.3)
Chloride: 98 mmol/L (ref 98–111)
Creatinine, Ser: 0.81 mg/dL (ref 0.44–1.00)
GFR, Estimated: >60 mL/min (ref >60)
Glucose, Bld: 117 mg/dL — ABNORMAL HIGH (ref 70–99)
Potassium: 4.2 mmol/L (ref 3.5–5.1)
Sodium: 137 mmol/L (ref 135–145)

## 2022-07-30 LAB — D-DIMER, QUANTITATIVE: D-Dimer, Quant: <0.27 ug/mL-FEU (ref 0.00–0.50)

## 2022-07-30 LAB — TROPONIN I (HIGH SENSITIVITY): Troponin I (High Sensitivity): 3 ng/L (ref <18)

## 2022-07-30 MED ORDER — EPINEPHRINE 0.3 MG/0.3ML IJ SOAJ
0.3000 mg | INTRAMUSCULAR | 0 refills | Status: DC | PRN
  Filled 2022-07-30: qty 2, 14d supply, fill #0
  Filled 2022-07-31: qty 2, 30d supply, fill #0

## 2022-07-30 MED ORDER — SODIUM CHLORIDE 0.9 % IV BOLUS
1000.0000 mL | Freq: Once | INTRAVENOUS | Status: AC
  Administered 2022-07-30: 1000 mL via INTRAVENOUS

## 2022-07-30 MED ORDER — LEVOCETIRIZINE DIHYDROCHLORIDE 5 MG PO TABS
5.0000 mg | ORAL_TABLET | Freq: Every evening | ORAL | 0 refills | Status: DC
  Filled 2022-07-30: qty 30, 30d supply, fill #0

## 2022-07-30 MED ORDER — PREDNISONE 20 MG PO TABS
40.0000 mg | ORAL_TABLET | Freq: Every day | ORAL | 0 refills | Status: DC
  Filled 2022-07-30 – 2022-07-31 (×2): qty 8, 4d supply, fill #0

## 2022-07-30 MED ORDER — METHYLPREDNISOLONE SODIUM SUCC 125 MG IJ SOLR
125.0000 mg | Freq: Once | INTRAMUSCULAR | Status: AC
  Administered 2022-07-30: 125 mg via INTRAVENOUS
  Filled 2022-07-30: qty 2

## 2022-07-30 MED ORDER — LEVOCETIRIZINE DIHYDROCHLORIDE 5 MG PO TABS
5.0000 mg | ORAL_TABLET | Freq: Every evening | ORAL | 0 refills | Status: DC
  Filled 2022-07-30 – 2022-07-31 (×2): qty 30, 30d supply, fill #0

## 2022-07-30 MED ORDER — FAMOTIDINE IN NACL 20-0.9 MG/50ML-% IV SOLN
20.0000 mg | Freq: Once | INTRAVENOUS | Status: AC
  Administered 2022-07-30: 20 mg via INTRAVENOUS
  Filled 2022-07-30: qty 50

## 2022-07-30 MED ORDER — DIPHENHYDRAMINE HCL 50 MG/ML IJ SOLN
25.0000 mg | Freq: Once | INTRAMUSCULAR | Status: AC
  Administered 2022-07-30: 25 mg via INTRAVENOUS
  Filled 2022-07-30: qty 1

## 2022-07-30 NOTE — ED Notes (Signed)
Pt reports resting high heart rate and some swelling in her tongue (not noticeable for me when I assessed her on arrival).  Pt notes family stress including her fathers recent death and the estate that she is the executor for

## 2022-07-30 NOTE — Discharge Instructions (Addendum)
Today for an swelling in your mouth and tongue likely due to allergic reaction that responded well to Benadryl and steroids.  You are given prescription for steroids and an antihistamine to take daily.  You need to follow with your PCP and may need to ultimately seek consultation with an allergist if this is a recurrent problem since we did not find a cause of your reaction today.  I have prescribed you an EpiPen.  This is only for use if you have a life-threatening reaction called anaphylaxis.  If you start having trouble breathing, trouble swallowing, feeling dizzy.  You have to use this you still need to come to the emergency room.    You are also evaluated for feeling dizzy for the past week.  Your EKG did not show any abnormalities, you had a negative test for a blood clot, you had mild anemia.  Follow-up closely with your primary care doctor for a recheck for this.  To the ER if you feel very dizzy, pass out, have chest pain or shortness of breath, or any other new or worsening symptoms.

## 2022-07-30 NOTE — ED Provider Notes (Signed)
[place] EMERGENCY DEPARTMENT AT [facility] Provider Note   CSN: [id number] Arrival date & time: 07/30/22  1104     History  Chief Complaint  Patient presents with   Oral Swelling    [name] is a 52 y.o. female.  Has PMH of CVA, migraines, T2DM.  Today for swelling of her tongue and cheeks, noticed this at work, states is the inside of her cheeks on her tongue, they look and feel thick to her.  No trouble swallowing or breathing, states she had some itching and a rash to her right arm last night she took some Benadryl but had no other rash or itching, no new medications lotions soaps detergents or cosmetics.  She has had a migraine for the past week that she has been taking her Bernita Raisin for. She also complains that she been having lightheadedness upon standing for the past several days, notices it when she is at work if she stands up from her chair.  States she has to lean against something for couple seconds until it goes away.  No chest pain or shortness of breath, no syncope  HPI     Home Medications Prior to Admission medications   Medication Sig Start Date End Date Taking? Authorizing Provider  EPINEPHrine 0.3 mg/0.3 mL IJ SOAJ injection Inject 0.3 mg into the muscle as needed for anaphylaxis. 07/30/22  Yes Gwenna Fuston A, PA-C  predniSONE (DELTASONE) 20 MG tablet Take 2 tablets (40 mg total) by mouth daily for 4 days. 07/31/22 08/04/22 Yes Joven Mom A, PA-C  allopurinol (ZYLOPRIM) 300 MG tablet Take 1 tablet (300 mg total) by mouth 2 (two) times daily. 06/04/22   Campbell Riches, NP  ALPRAZolam Prudy Feeler) 0.5 MG tablet Take 0.5-1 tablets (0.25-0.5 mg total) by mouth 2 (two) times daily as needed for anxiety. 06/04/22   Campbell Riches, NP  amitriptyline (ELAVIL) 75 MG tablet Take 1 tablet (75 mg total) by mouth at bedtime. 06/18/22   Drema Dallas, DO  aspirin EC 81 MG tablet Take 1 tablet (81 mg total) by mouth daily. Swallow whole. 11/17/21 11/17/22   Carrion-Carrero, Karle Starch, MD  Blood Glucose Monitoring Suppl (BLOOD GLUCOSE MONITOR SYSTEM) w/Device KIT Use in the morning, at noon, and at bedtime. 06/08/22   Campbell Riches, NP  Continuous Glucose Sensor (FREESTYLE LIBRE 3 SENSOR) MISC Apply as directed every 14 days 07/31/21   Campbell Riches, NP  cyanocobalamin (VITAMIN B12) 1000 MCG/ML injection Inject 1 mL into the muscle every 30 days. 04/20/22   Babs Sciara, MD  cyclobenzaprine (FLEXERIL) 10 MG tablet Take 1 tablet (10 mg total) by mouth daily as needed for muscle spasms. 06/04/22   Campbell Riches, NP  famotidine (PEPCID) 40 MG tablet Take 1 tablet (40 mg total) by mouth at bedtime. 06/04/22   Campbell Riches, NP  gabapentin (NEURONTIN) 800 MG tablet Take 1 tablet (800 mg total) by mouth 3 (three) times daily. 01/08/22   Drema Dallas, DO  glucose blood (FREESTYLE LITE) test strip Use twice daily as directed to check blood sugar 11/13/21   Babs Sciara, MD  ibuprofen (ADVIL) 800 MG tablet Take 1 tablet (800 mg total) by mouth every 8 (eight) hours as needed. 03/05/22   Campbell Riches, NP  levocetirizine (XYZAL) 5 MG tablet Take 1 tablet (5 mg total) by mouth every evening. 07/30/22   Carmel Sacramento A, PA-C  lidocaine-prilocaine (EMLA) cream Apply to feet as needed for  nerve pain 06/04/22   Campbell Riches, NP  LOTEMAX SM 0.38 % GEL Place 1 Application into the left eye daily. 10/28/21   [provider]  magic mouthwash (nystatin, hydrocortisone, diphenhydrAMINE) suspension Swish and spit 5 MLs by mouth  3 times a day as needed 06/02/22   Babs Sciara, MD  metFORMIN (GLUCOPHAGE) 1000 MG tablet Take 1 tablet (1,000 mg total) by mouth 2 (two) times daily before a meal. 06/04/22   Campbell Riches, NP  montelukast (SINGULAIR) 10 MG tablet Take 1 tablet (10 mg total) by mouth daily. 06/04/22   Campbell Riches, NP  omega-3 acid ethyl esters (LOVAZA) 1 g capsule Take 2 capsules (2 g total) by mouth 2 (two) times daily.  06/04/22   Campbell Riches, NP  pantoprazole (PROTONIX) 40 MG tablet Take 1 tablet (40 mg total) by mouth 2 (two) times daily before a meal. 06/04/22 06/04/23  Campbell Riches, NP  Perfluorohexyloctane (MIEBO) 1.338 GM/ML SOLN Place 1 drop in both eyes up to 4 times daily as directed. 01/08/22     rosuvastatin (CRESTOR) 10 MG tablet Take 1 tablet (10 mg total) by mouth at bedtime. 06/04/22   Campbell Riches, NP  tirzepatide [facility]) 15 MG/0.5ML Pen Inject 15 mg into the skin once a week. 06/04/22   Campbell Riches, NP  Ubrogepant (UBRELVY) 100 MG TABS Take 1 tablet (100 mg total) by mouth as needed. May repeat after 2 hours.  Maximum 2 tablets in 24 hours. 03/22/22   Drema Dallas, DO  Varenicline Tartrate (TYRVAYA) 0.03 MG/ACT SOLN Use 2 times a day as directed 03/22/22     Varenicline Tartrate (TYRVAYA) 0.03 MG/ACT SOLN Use twice daily as directed. 05/26/22     cetirizine (ZYRTEC) 10 MG tablet Take 1 tablet (10 mg total) by mouth daily. 09/24/19 03/03/20  Junie Spencer, FNP      Allergies    Omnicef [cefdinir] and Losartan    Review of Systems   Review of Systems  Physical Exam Updated Vital Signs BP 120/84   Pulse (!) 115   Temp 98 F (36.7 C) (Oral)   Resp (!) 26   SpO2 95%  Physical Exam Vitals and nursing note reviewed.  Constitutional:      General: She is not in acute distress.    Appearance: She is well-developed.  HENT:     Head: Normocephalic and atraumatic.     Comments: Tongue buccal mucosa, posterior pharynx all grossly normal in appearance, I do not appreciate any swelling patient speaks in full and clear sentences, handles oral secretions well.    Mouth/Throat:     Mouth: Mucous membranes are moist.     Pharynx: Oropharynx is clear.  Eyes:     Conjunctiva/sclera: Conjunctivae normal.  Cardiovascular:     Rate and Rhythm: Normal rate and regular rhythm.     Heart sounds: No murmur heard. Pulmonary:     Effort: Pulmonary effort is normal. No respiratory  distress.     Breath sounds: Normal breath sounds.  Abdominal:     Palpations: Abdomen is soft.     Tenderness: There is no abdominal tenderness.  Musculoskeletal:        General: No swelling.     Cervical back: Neck supple.  Skin:    General: Skin is warm and dry.     Capillary Refill: Capillary refill takes less than 2 seconds.  Neurological:     Mental Status: She is alert.  Psychiatric:        Mood and Affect: Mood normal.     ED Results / Procedures / Treatments   Labs (all labs ordered are listed, but only abnormal results are displayed) Labs Reviewed  BASIC METABOLIC PANEL - Abnormal; Notable for the following components:      Result Value   Glucose, Bld 117 (*)    All other components within normal limits  CBC WITH DIFFERENTIAL/PLATELET - Abnormal; Notable for the following components:   Hemoglobin 11.0 (*)    HCT 34.5 (*)    All other components within normal limits  D-DIMER, QUANTITATIVE  TROPONIN I (HIGH SENSITIVITY)    EKG EKG Interpretation  Date/Time:  Friday July 30 2022 13:37:45 EDT Ventricular Rate:  107 PR Interval:  184 QRS Duration: 87 QT Interval:  365 QTC Calculation: 487 R Axis:   60 Text Interpretation: Sinus tachycardia Low voltage, precordial leads Borderline prolonged QT interval No significant change since prior 9/23 Confirmed by Meridee Score [id number]) on 07/30/2022 1:46:12 PM  Radiology No results found.  Procedures Procedures    Medications Ordered in ED Medications  diphenhydrAMINE (BENADRYL) injection 25 mg (25 mg Intravenous Given 07/30/22 1202)  methylPREDNISolone sodium succinate (SOLU-MEDROL) 125 mg/2 mL injection 125 mg (125 mg Intravenous Given 07/30/22 1202)  famotidine (PEPCID) IVPB 20 mg premix (0 mg Intravenous Stopped 07/30/22 1337)  sodium chloride 0.9 % bolus 1,000 mL (0 mLs Intravenous Stopped 07/30/22 1400)    ED Course/ Medical Decision Making/ A&P                             Medical Decision Making DDX:  anaphylaxis, angioedema, anxiety, orthostatic hypotension, other  ED course: Patient presented today for feeling of swelling in her mouth, she denies any new foods, detergents, medications, or anything that could have caused this, she is not on an ACE inhibitor, I do not appreciate any significant swelling or speech changes but she felt like her "tongue was thick".  This resolved with steroids and Benadryl.  She is also complaining of feeling lightheaded when she stood up all week, denied any vertigo, had normal neurologic exam.  She felt much better after IV fluids.  She is not orthostatic but this was done after she received her fluid bolus.  I suspect that she may have been somewhat fluid depleted, she has been working a lot and has reported that she has a lot of stress related to ongoing problems with her sister related to her father's estate.  Is noted that her heart rate was very fast on arrival, ECG showed sinus rhythm, she had no chest pain or shortness of breath, improved to heart rate of 106, repeat evaluation after IV fluids but then once we started talking again she expressed a lot of anxiety regarding her sister and heart rate started going up again.  Multiple previous visits show elevated heart rate of 107, 108.  Patient states she always seems to have a fast heart rate and feels that today is somewhat worse due to her anxiety.  She had negative D-dimer, negative troponin, CBC and BMP were reassuring.  I did discuss with her that her immunoglobin was slightly low at 11, she denied any bleeding, blood in her stool.  Discussed need for PCP follow-up.  Amount and/or Complexity of Data Reviewed Labs: ordered.  Risk Prescription drug management.           Final Clinical Impression(s) / ED  Diagnoses Final diagnoses:  Allergic reaction, initial encounter    Rx / DC Orders ED Discharge Orders          Ordered    predniSONE (DELTASONE) 20 MG tablet  Daily        07/30/22 1516     levocetirizine (XYZAL) 5 MG tablet  Every evening,   Status:  Discontinued        07/30/22 1516    EPINEPHrine 0.3 mg/0.3 mL IJ SOAJ injection  As needed        07/30/22 1517    levocetirizine (XYZAL) 5 MG tablet  Every evening        07/30/22 1536              Ma Rings, PA-C 07/30/22 1916    Terrilee Files, MD 07/31/22 1654

## 2022-07-30 NOTE — ED Triage Notes (Signed)
Pt reports feeling her "tongue is thick and her cheeks are swollen"  Pt has reported dizziness off and on this week as well. No new exposures to substances that may have caused allergic reaction.  States dizziness is worse with activity.

## 2022-07-31 ENCOUNTER — Other Ambulatory Visit (HOSPITAL_COMMUNITY): Payer: Self-pay

## 2022-08-02 ENCOUNTER — Ambulatory Visit: Payer: Commercial Managed Care - PPO | Admitting: Family Medicine

## 2022-08-02 ENCOUNTER — Telehealth: Payer: Self-pay

## 2022-08-02 VITALS — BP 129/84 | HR 117 | Wt 169.6 lb

## 2022-08-02 DIAGNOSIS — E538 Deficiency of other specified B group vitamins: Secondary | ICD-10-CM | POA: Diagnosis not present

## 2022-08-02 DIAGNOSIS — K1379 Other lesions of oral mucosa: Secondary | ICD-10-CM

## 2022-08-02 DIAGNOSIS — D649 Anemia, unspecified: Secondary | ICD-10-CM | POA: Diagnosis not present

## 2022-08-02 DIAGNOSIS — T7840XD Allergy, unspecified, subsequent encounter: Secondary | ICD-10-CM

## 2022-08-02 MED ORDER — NYSTATIN 100000 UNIT/ML MT SUSP: 5.0000 mL | Freq: Four times a day (QID) | OROMUCOSAL | 2 refills | Status: DC

## 2022-08-02 NOTE — Progress Notes (Signed)
tibc  Subjective:    Patient ID: [name], female    DOB: 05/20/1970, 52 y.o.   MRN: [id number]  HPI Patient arrives today for hospital follow up. Patient has had dizziness as well this week. 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  Review of Systems     Objective:   Physical Exam General-in no acute distress Eyes-no discharge Lungs-respiratory rate normal, CTA CV-no murmurs,RRR Extremities skin warm dry no edema Neuro grossly normal Behavior normal, alert She does have irritation of the gum lines from the prednisone       Assessment & Plan:  Possible early yeast/thrush related issue-needs nystatin oral solution Also will go ahead with allergist consultation for allergic reaction Also her hemoglobin was slightly low even though she is up-to-date on colonoscopy we will run additional lab work to help delineate the issue for this As for the fissures in the tongue I do not have any ready solutions for this currently

## 2022-08-02 NOTE — Transitions of Care (Post Inpatient/ED Visit) (Signed)
08/02/2022  Name: [name] MRN: [id number] DOB: [birth year]  Today's TOC FU Call Status: Today's TOC FU Call Status:: Successful TOC FU Call Competed TOC FU Call Complete Date: 08/02/22  Transition Care Management Follow-up Telephone Call Date of Discharge: 07/30/22 [facility] Type of Discharge: Emergency Department Reason for ED Visit: Other: (allery) How have you been since you were released from the hospital?: Better  Items Reviewed: Did you receive and understand the discharge instructions provided?: No Medications obtained,verified, and reconciled?: Yes (Medications Reviewed) Any new allergies since your discharge?: No Dietary orders reviewed?: Yes Do you have support at home?: Yes  Medications Reviewed Today: Medications Reviewed Today     Reviewed by Karena Addison, LPN (Licensed Practical Nurse) on 08/02/22 at 1550  Med List Status: <None>   Medication Order Taking? Sig Documenting Provider Last Dose Status Informant  allopurinol (ZYLOPRIM) 300 MG tablet [id number]  Take 1 tablet (300 mg total) by mouth 2 (two) times daily. Campbell Riches, NP  Active   ALPRAZolam Prudy Feeler) 0.5 MG tablet [id number]  Take 0.5-1 tablets (0.25-0.5 mg total) by mouth 2 (two) times daily as needed for anxiety. Campbell Riches, NP  Active   amitriptyline (ELAVIL) 75 MG tablet [id number]  Take 1 tablet (75 mg total) by mouth at bedtime. Drema Dallas, DO  Active   aspirin EC 81 MG tablet [id number] No Take 1 tablet (81 mg total) by mouth daily. Swallow whole. Carrion-Carrero, Karle Starch, MD Taking Active   Blood Glucose Monitoring Suppl (BLOOD GLUCOSE MONITOR SYSTEM) w/Device KIT [id number]  Use in the morning, at noon, and at bedtime. Campbell Riches, NP  Active   Discontinued 03/03/20 1412 (Change in therapy)   Continuous Glucose Sensor (FREESTYLE LIBRE 3 SENSOR) MISC [id number] No Apply as directed every 14 days Campbell Riches, NP Taking Active Self   cyanocobalamin (VITAMIN B12) 1000 MCG/ML injection [id number] No Inject 1 mL into the muscle every 30 days. Babs Sciara, MD Taking Active   cyclobenzaprine (FLEXERIL) 10 MG tablet [id number]  Take 1 tablet (10 mg total) by mouth daily as needed for muscle spasms. Sherie Don C, NP  Active   EPINEPHrine 0.3 mg/0.3 mL IJ SOAJ injection [id number]  Inject 0.3 mg into the muscle as needed for anaphylaxis. Carmel Sacramento A, PA-C  Active   famotidine (PEPCID) 40 MG tablet [id number]  Take 1 tablet (40 mg total) by mouth at bedtime. Campbell Riches, NP  Active   gabapentin (NEURONTIN) 800 MG tablet [id number] No Take 1 tablet (800 mg total) by mouth 3 (three) times daily. Drema Dallas, DO Taking Active   glucose blood (FREESTYLE LITE) test strip [id number] No Use twice daily as directed to check blood sugar Luking, Jonna Coup, MD Taking Active Self  ibuprofen (ADVIL) 800 MG tablet [id number] No Take 1 tablet (800 mg total) by mouth every 8 (eight) hours as needed. Campbell Riches, NP Taking Active   levocetirizine (XYZAL) 5 MG tablet [id number]  Take 1 tablet (5 mg total) by mouth every evening. Cristi Loron, Celeste A, PA-C  Active   lidocaine-prilocaine (EMLA) cream [id number]  Apply to feet as needed for nerve pain Campbell Riches, NP  Active   LOTEMAX SM 0.38 % GEL [id number] No Place 1 Application into the left eye daily. [provider] Not Taking Active Self  magic mouthwash (nystatin, hydrocortisone, diphenhydrAMINE) suspension [id number] No Swish and spit 5 MLs by mouth  3 times a day as needed Luking,  Jonna Coup, MD Taking Active   metFORMIN (GLUCOPHAGE) 1000 MG tablet [id number]  Take 1 tablet (1,000 mg total) by mouth 2 (two) times daily before a meal. Campbell Riches, NP  Active   montelukast (SINGULAIR) 10 MG tablet [id number]  Take 1 tablet (10 mg total) by mouth daily. Campbell Riches, NP  Active   omega-3 acid ethyl esters (LOVAZA) 1 g capsule [id number]  Take 2 capsules (2 g total)  by mouth 2 (two) times daily. Campbell Riches, NP  Active   pantoprazole (PROTONIX) 40 MG tablet [id number]  Take 1 tablet (40 mg total) by mouth 2 (two) times daily before a meal. Campbell Riches, NP  Active   Perfluorohexyloctane (MIEBO) 1.338 GM/ML SOLN [id number] No Place 1 drop in both eyes up to 4 times daily as directed.  Taking Active   predniSONE (DELTASONE) 20 MG tablet [id number]  Take 2 tablets (40 mg total) by mouth daily for 4 days. Cristi Loron, Celeste A, PA-C  Active   rosuvastatin (CRESTOR) 10 MG tablet [id number]  Take 1 tablet (10 mg total) by mouth at bedtime. Campbell Riches, NP  Active   tirzepatide [facility]) 15 MG/0.5ML Pen [id number]  Inject 15 mg into the skin once a week. Campbell Riches, NP  Active   Ubrogepant (UBRELVY) 100 MG TABS [id number] No Take 1 tablet (100 mg total) by mouth as needed. May repeat after 2 hours.  Maximum 2 tablets in 24 hours. Drema Dallas, DO Taking Active   Varenicline Tartrate (TYRVAYA) 0.03 MG/ACT SOLN [id number] No Use 2 times a day as directed  Taking Active   Varenicline Tartrate (TYRVAYA) 0.03 MG/ACT SOLN [id number] No Use twice daily as directed.  Taking Active             Home Care and Equipment/Supplies: Were Home Health Services Ordered?: NA Any new equipment or medical supplies ordered?: NA  Functional Questionnaire: Do you need assistance with bathing/showering or dressing?: No Do you need assistance with meal preparation?: No Do you need assistance with eating?: No Do you have difficulty maintaining continence: No Do you need assistance with getting out of bed/getting out of a chair/moving?: No Do you have difficulty managing or taking your medications?: No  Follow up appointments reviewed: PCP Follow-up appointment confirmed?: Yes Date of PCP follow-up appointment?: 08/02/22 Follow-up Provider: [facility] Follow-up appointment confirmed?: NA Do you need transportation to your follow-up appointment?:  No Do you understand care options if your condition(s) worsen?: Yes-patient verbalized understanding    SIGNATURE Karena Addison, LPN [facility] Nurse Health Advisor Direct Dial [phone]

## 2022-08-03 ENCOUNTER — Other Ambulatory Visit: Payer: Self-pay

## 2022-08-03 DIAGNOSIS — T7840XD Allergy, unspecified, subsequent encounter: Secondary | ICD-10-CM

## 2022-08-06 DIAGNOSIS — D649 Anemia, unspecified: Secondary | ICD-10-CM | POA: Diagnosis not present

## 2022-08-06 DIAGNOSIS — E538 Deficiency of other specified B group vitamins: Secondary | ICD-10-CM | POA: Diagnosis not present

## 2022-08-07 LAB — VITAMIN B12: Vitamin B-12: 597 pg/mL (ref 232–1245)

## 2022-08-07 LAB — HEMOGLOBIN AND HEMATOCRIT, BLOOD
Hematocrit: 35.9 % (ref 34.0–46.6)
Hemoglobin: 11.5 g/dL (ref 11.1–15.9)

## 2022-08-07 LAB — IRON,TIBC AND FERRITIN PANEL
Ferritin: 26 ng/mL (ref 15–150)
Iron Saturation: 11 % — ABNORMAL LOW (ref 15–55)
Iron: 46 ug/dL (ref 27–159)
Total Iron Binding Capacity: 419 ug/dL (ref 250–450)
UIBC: 373 ug/dL (ref 131–425)

## 2022-08-08 ENCOUNTER — Encounter: Payer: Self-pay | Admitting: Family Medicine

## 2022-08-09 ENCOUNTER — Other Ambulatory Visit: Payer: Self-pay | Admitting: Neurology

## 2022-08-10 ENCOUNTER — Other Ambulatory Visit: Payer: Self-pay | Admitting: Family Medicine

## 2022-08-10 ENCOUNTER — Other Ambulatory Visit: Payer: Self-pay

## 2022-08-10 ENCOUNTER — Other Ambulatory Visit (HOSPITAL_COMMUNITY): Payer: Self-pay

## 2022-08-10 ENCOUNTER — Encounter: Payer: Self-pay | Admitting: Family Medicine

## 2022-08-10 MED ORDER — IRON (FERROUS SULFATE) 325 (65 FE) MG PO TABS
325.0000 mg | ORAL_TABLET | Freq: Every day | ORAL | 1 refills | Status: DC
  Filled 2022-08-10: qty 90, 90d supply, fill #0
  Filled 2022-11-06 – 2022-11-08 (×2): qty 90, 90d supply, fill #1

## 2022-08-10 MED ORDER — LEVOCETIRIZINE DIHYDROCHLORIDE 5 MG PO TABS
5.0000 mg | ORAL_TABLET | Freq: Every evening | ORAL | 2 refills | Status: DC
  Filled 2022-08-10: qty 30, 30d supply, fill #0

## 2022-08-10 NOTE — Telephone Encounter (Signed)
Nurses It would be fine to send in iron tablet 325 mg iron sulfate, #90, 1 daily to [facility] pharmacy Please see result note message that I sent to the nurses several days ago Please help set up a stool test for blood Please connect with the patient regarding this You may share this message with her  Hi [name] Our staff will send in the iron tablet They will help set up the stool test for blood Unfortunately lately we were short staffed which caused some delay between when I sent to that message and when the nurses were able to follow through on all of this.  This should be taking care of this week.  Thank you for your understanding-Dr. Lorin Picket

## 2022-08-10 NOTE — Telephone Encounter (Signed)
See other my chart message concerning iron

## 2022-08-11 ENCOUNTER — Other Ambulatory Visit (HOSPITAL_COMMUNITY): Payer: Self-pay

## 2022-08-11 ENCOUNTER — Other Ambulatory Visit: Payer: Self-pay

## 2022-08-11 DIAGNOSIS — D649 Anemia, unspecified: Secondary | ICD-10-CM

## 2022-08-11 MED ORDER — GABAPENTIN 800 MG PO TABS
800.0000 mg | ORAL_TABLET | Freq: Three times a day (TID) | ORAL | 1 refills | Status: DC
  Filled 2022-08-11: qty 270, 90d supply, fill #0
  Filled 2022-11-06 – 2022-11-08 (×2): qty 270, 90d supply, fill #1

## 2022-08-13 ENCOUNTER — Other Ambulatory Visit (HOSPITAL_COMMUNITY): Payer: Self-pay

## 2022-08-16 ENCOUNTER — Other Ambulatory Visit: Payer: Self-pay

## 2022-08-24 ENCOUNTER — Other Ambulatory Visit: Payer: Self-pay

## 2022-08-30 ENCOUNTER — Other Ambulatory Visit (HOSPITAL_COMMUNITY): Payer: Self-pay

## 2022-08-31 ENCOUNTER — Other Ambulatory Visit (HOSPITAL_COMMUNITY): Payer: Self-pay

## 2022-08-31 ENCOUNTER — Other Ambulatory Visit: Payer: Self-pay

## 2022-09-01 ENCOUNTER — Other Ambulatory Visit (HOSPITAL_COMMUNITY): Payer: Self-pay

## 2022-09-06 ENCOUNTER — Other Ambulatory Visit (HOSPITAL_COMMUNITY): Payer: Self-pay

## 2022-09-06 ENCOUNTER — Other Ambulatory Visit: Payer: Self-pay

## 2022-09-07 ENCOUNTER — Telehealth (INDEPENDENT_AMBULATORY_CARE_PROVIDER_SITE_OTHER): Payer: Commercial Managed Care - PPO

## 2022-09-07 DIAGNOSIS — D649 Anemia, unspecified: Secondary | ICD-10-CM

## 2022-09-07 LAB — IFOBT (OCCULT BLOOD): IFOBT: POSITIVE

## 2022-09-07 NOTE — Telephone Encounter (Signed)
IFOBT Positive

## 2022-09-07 NOTE — Telephone Encounter (Signed)
See result note recommend referral to [facility]

## 2022-09-07 NOTE — Telephone Encounter (Signed)
Stool sample drop off placed in clinical

## 2022-09-08 ENCOUNTER — Other Ambulatory Visit (HOSPITAL_COMMUNITY): Payer: Self-pay

## 2022-09-08 ENCOUNTER — Ambulatory Visit: Payer: Commercial Managed Care - PPO | Admitting: Allergy & Immunology

## 2022-09-08 ENCOUNTER — Encounter: Payer: Self-pay | Admitting: Allergy & Immunology

## 2022-09-08 ENCOUNTER — Other Ambulatory Visit: Payer: Self-pay

## 2022-09-08 VITALS — BP 104/78 | HR 123 | Temp 98.2°F | Resp 18 | Ht 65.35 in | Wt 171.4 lb

## 2022-09-08 DIAGNOSIS — K921 Melena: Secondary | ICD-10-CM

## 2022-09-08 DIAGNOSIS — J3089 Other allergic rhinitis: Secondary | ICD-10-CM

## 2022-09-08 DIAGNOSIS — J302 Other seasonal allergic rhinitis: Secondary | ICD-10-CM

## 2022-09-08 DIAGNOSIS — T782XXD Anaphylactic shock, unspecified, subsequent encounter: Secondary | ICD-10-CM

## 2022-09-08 MED ORDER — LEVOCETIRIZINE DIHYDROCHLORIDE 5 MG PO TABS
5.0000 mg | ORAL_TABLET | Freq: Every day | ORAL | 1 refills | Status: DC | PRN
  Filled 2022-09-08: qty 90, 90d supply, fill #0
  Filled 2022-12-19: qty 90, 90d supply, fill #1
  Filled 2023-03-13: qty 90, 90d supply, fill #2
  Filled 2023-05-04 – 2023-06-06 (×2): qty 90, 90d supply, fill #3

## 2022-09-08 MED ORDER — MONTELUKAST SODIUM 10 MG PO TABS
10.0000 mg | ORAL_TABLET | Freq: Every day | ORAL | 1 refills | Status: DC
  Filled 2022-09-08: qty 90, 90d supply, fill #0
  Filled 2022-12-19: qty 90, 90d supply, fill #1

## 2022-09-08 MED ORDER — EPINEPHRINE 0.3 MG/0.3ML IJ SOAJ
0.3000 mg | INTRAMUSCULAR | 1 refills | Status: DC | PRN
  Filled 2022-09-08: qty 2, 5d supply, fill #0
  Filled 2023-04-09: qty 2, 5d supply, fill #1

## 2022-09-08 NOTE — Progress Notes (Signed)
NEW PATIENT  Date of Service/Encounter:  09/08/22  Consult requested by: Babs Sciara, MD   Assessment:   Anaphylaxis - unclear trigger  Seasonal and perennial allergic rhinitis (grasses, ragweed, indoor molds, and cat)  Complicated past medical history including CVA, migraine, type 2 diabetes  Plan/Recommendations:   1. Anaphylaxis - unknown trigger - I am not sure what is going on with you, but testing to the most common foods as well as blueberry was negative. - We are to get some lab work to rule out weird causes of anaphylaxis. - I think you can eat blueberries at home and see how you do. - Make sure that this is during the day and make sure your husband is with you and your EpiPen is ready. - Emergency anaphylaxis plan provided. - EpiPen training reviewed. - This could be due to oral allergy syndrome, but I am not entirely sure since this typically is not a problem if the food is cooked. - Also, there is not typically an issue with oral allergy syndrome. - The oral allergy syndrome (OAS) or pollen-food allergy syndrome (PFAS) is a relatively common form of food allergy, particularly in adults.  - It typically occurs in people who have pollen allergies when the immune system "sees" proteins on the food that look like proteins on the pollen.  - This results in the allergy antibody (IgE) binding to the food instead of the pollen.  - Patients typically report itching and/or mild swelling of the mouth and throat immediately following ingestion of certain uncooked fruits (including nuts) or raw vegetables.  - Only a very small number of affected individuals experience systemic allergic reactions, such as anaphylaxis which occurs with true food allergies.    2. Seasonal and perennial allergic rhinitis - Testing today showed: grasses, ragweed, indoor molds, and cat - Copy of test results provided.  - Avoidance measures provided. - Continue with: Xyzal (levocetirizine) 5mg   tablet once daily and Singulair (montelukast) 10mg  daily - You can use an extra dose of the antihistamine, if needed, for breakthrough symptoms.  - Consider nasal saline rinses 1-2 times daily to remove allergens from the nasal cavities as well as help with mucous clearance (this is especially helpful to do before the nasal sprays are given) - Consider allergy shots as a means of long-term control. - Allergy shots "re-train" and "reset" the immune system to ignore environmental allergens and decrease the resulting immune response to those allergens (sneezing, itchy watery eyes, runny nose, nasal congestion, etc).    - Allergy shots improve symptoms in 75-85% of patients.  - We can discuss more at the next appointment if the medications are not working for you.   3. Return in about 2 months (around 11/09/2022). You can have the follow up appointment with Dr. Dellis Anes or a Nurse Practicioner (our Nurse Practitioners are excellent and always have Physician oversight!).     This note in its entirety was forwarded to the Provider who requested this consultation.  Subjective:   JOBINA MAITA is a 52 y.o. female presenting today for evaluation of  Chief Complaint  Patient presents with   Allergic Reaction    Had a blueberry muffin from [employer]. Felt like her tongue was swelling. Took prednisone 20mg  and had blisters on her tongue. Had an allergy test years ago and was on injections     HYDIE LANGAN has a history of the following: Patient Active Problem List   Diagnosis Date Noted  Respiratory infection 08/03/2021   Chronic idiopathic gout involving toe of left foot without tophus 09/06/2020   Migraine without aura and without status migrainosus, not intractable 04/04/2020   Seasonal allergic rhinitis 03/03/2020   Type 2 diabetes mellitus (HCC) 02/03/2019   Mixed hyperlipidemia 02/03/2019   B12 deficiency 12/18/2018   NAFL (nonalcoholic fatty liver) 11/07/2018   Elevated liver enzymes  09/22/2018   Chronic insomnia 08/19/2018   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  Elige Radon was referred by Babs Sciara, MD.     Wanisha is a 52 y.o. female presenting for an evaluation of an allergic reaction .  She went to [employer] and got a blueberry muffin. She ate it and her tongue started "to get thick" within an hour or so. She was throat clearing and then she started asking around for Benadryl. She was having some trouble talking and they encouraged her to go to the ED.   She presented to the emergency room on June 7 with swelling of her tongue and cheeks.  She felt that they were infected.  She had no trouble swallowing or breathing.  She did have some itching and a rash on her right arm which she treated with Benadryl.  In the ER, she received a metabolic panel which was largely normal.  She also had a complete blood count which showed a mildly low H&H.  She received Benadryl, methylprednisolone, famotidine, and a normal saline bolus.  A D-dimer and troponin as well as an EKG were all normal.  This was all that she ate at [employer]. She did not have anything else that morning.   She eats peanuts, tree nuts, cow's milk (has some lactose intolerance), seafood, eggs, wheat, and sesame. She  does not particularly avoid soy, but she does not eat. She did have allergy testing performed 20 years ago. She was having a lot of allergies at that time and was diagnosed with tree, grass, and mold as well as dust allergies.   Her husband works in Production designer, theatre/television/film with [employer]. 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 She was then diagnosed with a migraine disorder as well. She had left sided drooping of her face.  She is now on chronic aspirin.   Otherwise, there is no history of other atopic diseases, including drug allergies, stinging insect allergies, or contact dermatitis. There is no significant infectious history. Vaccinations are up to date.    Past Medical History: Patient Active Problem List   Diagnosis Date Noted   Respiratory infection 08/03/2021   Chronic idiopathic gout involving toe of left foot without tophus 09/06/2020   Migraine without aura and without status migrainosus, not intractable 04/04/2020   Seasonal allergic rhinitis 03/03/2020   Type 2 diabetes mellitus (HCC) 02/03/2019   Mixed hyperlipidemia 02/03/2019   B12 deficiency 12/18/2018   NAFL (nonalcoholic fatty liver) 11/07/2018   Elevated liver enzymes 09/22/2018   Chronic insomnia 08/19/2018   Central obesity 08/19/2018   Peripheral  polyneuropathy 09/20/2017   Acne rosacea 11/20/2016   Vitamin D deficiency 10/29/2014   Class 1 obesity due to excess calories with serious comorbidity and body mass index (BMI) of 33.0 to 33.9 in adult 04/05/2014   Esophageal reflux 10/13/2013   Irritable bowel syndrome with constipation 06/25/2013   Anxiety 02/14/2013   Knee pain, chronic 02/14/2013    Medication List:  Allergies as of 09/08/2022       Reactions   Omnicef [cefdinir] Other (See Comments)   Patient states antibiotic does not work. Has tried multiple times.   Losartan Rash   Broke out on face only; no allergic reaction        Medication List        Accurate as of September 08, 2022  1:30 PM. If you have any questions, ask your nurse or doctor.          allopurinol 300 MG tablet Commonly known as: ZYLOPRIM Take 1 tablet (300 mg total) by mouth 2 (two) times daily.   ALPRAZolam 0.5 MG  tablet Commonly known as: XANAX Take 0.5-1 tablets (0.25-0.5 mg total) by mouth 2 (two) times daily as needed for anxiety.   amitriptyline 75 MG tablet Commonly known as: ELAVIL Take 1 tablet (75 mg total) by mouth at bedtime.   aspirin EC 81 MG tablet Take 1 tablet (81 mg total) by mouth daily. Swallow whole.   cyanocobalamin 1000 MCG/ML injection Commonly known as: VITAMIN B12 Inject 1 mL into the muscle every 30 days.   cyclobenzaprine 10 MG tablet Commonly known as: FLEXERIL Take 1 tablet (10 mg total) by mouth daily as needed for muscle spasms.   EPINEPHrine 0.3 mg/0.3 mL Soaj injection Commonly known as: EPI-PEN Inject 0.3 mg into the muscle as needed for anaphylaxis.   famotidine 40 MG tablet Commonly known as: PEPCID Take 1 tablet (40 mg total) by mouth at bedtime.   FeroSul 325 (65 Fe) MG tablet Generic drug: ferrous sulfate Take one tablet (325 mg) by mouth daily.   FreeStyle Libre 3 Sensor Misc Apply as directed every 14 days   FREESTYLE LITE test strip Generic drug: glucose blood Use twice daily as directed to check blood sugar   FreeStyle Lite w/Device Kit Use in the morning, at noon, and at bedtime.   gabapentin 800 MG tablet Commonly known as: Neurontin Take 1 tablet (800 mg total) by mouth 3 (three) times daily.   levocetirizine 5 MG tablet Commonly known as: XYZAL Take 1 tablet (5 mg total) by mouth daily as needed for allergies (Can take an etxra dose during flare ups). What changed:  when to take this reasons to take this Changed by: Alfonse Spruce   lidocaine-prilocaine cream Commonly known as: EMLA Apply to feet as needed for nerve pain   Lotemax SM 0.38 % Gel Generic drug: Loteprednol Etabonate Place 1 Application into the left eye daily.   magic mouthwash (nystatin, hydrocortisone, diphenhydrAMINE) suspension Swish and spit 5 MLs by mouth  3 times a day as needed   metFORMIN 1000 MG tablet Commonly known as:  GLUCOPHAGE Take 1 tablet (1,000 mg total) by mouth 2 (two) times daily before a meal.   Miebo 1.338 GM/ML Soln Generic drug: Perfluorohexyloctane Place 1 drop in both eyes up to 4 times daily as directed.   montelukast 10 MG tablet Commonly known as: SINGULAIR Take 1 tablet (10 mg total) by mouth daily.   Mounjaro 15 MG/0.5ML Pen Generic drug: tirzepatide Inject 15 mg into the skin  once a week.   nystatin 100000 UNIT/ML suspension Commonly known as: MYCOSTATIN Take 5 mLs (500,000 Units total) by mouth 4 (four) times daily.   omega-3 acid ethyl esters 1 g capsule Commonly known as: LOVAZA Take 2 capsules (2 g total) by mouth 2 (two) times daily.   pantoprazole 40 MG tablet Commonly known as: PROTONIX Take 1 tablet (40 mg total) by mouth 2 (two) times daily before a meal.   rosuvastatin 10 MG tablet Commonly known as: CRESTOR Take 1 tablet (10 mg total) by mouth at bedtime.   Tyrvaya 0.03 MG/ACT Soln Generic drug: Varenicline Tartrate Use 2 times a day as directed   Tyrvaya 0.03 MG/ACT Soln Generic drug: Varenicline Tartrate Use twice daily as directed.   Ubrelvy 100 MG Tabs Generic drug: Ubrogepant Take 1 tablet (100 mg total) by mouth as needed. May repeat after 2 hours.  Maximum 2 tablets in 24 hours.        Birth History: non-contributory  Developmental History: non-contributory  Past Surgical History: Past Surgical History:  Procedure Laterality Date   ABDOMINAL HYSTERECTOMY  2005   partial   BLADDER SUSPENSION  2012   BREAST REDUCTION SURGERY  2002   CHOLECYSTECTOMY  2005   COLONOSCOPY  07/10/2021   EYE SURGERY  1999   laser    UPPER GASTROINTESTINAL ENDOSCOPY     20 years ago by Dr. Jena Gauss. - told she had ulcers     Family History: Family History  Problem Relation Age of Onset   Diabetes Mother    Neuropathy Mother    Heart failure Mother    Hypertension Mother    Cancer Mother    Asthma Father    Atrial fibrillation Father     Hypertension Father    Colonic polyp Father      Social History: Jerni lives at home with her husband.  She does have a house that is 50 years old.  There is laminate flooring in the main living areas of the hardwoods in the bedroom.  They have gas heating and central cooling.  There is a goldendoodle in a door key inside of the home.  There are no dust mite covers on the bedding.  There is no tobacco exposure.  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  There is no fume, chemical, or dust exposure.  There is no HEPA filter.  She does not live near an interstate or industrial area.  We actually saw her father as well last fall, but he died suddenly from a myocardial infarction after we saw him.   Review of systems otherwise negative other than that mentioned in the HPI.    Objective:   Blood pressure 104/78, pulse (!) 123, temperature 98.2 F (36.8 C), resp. rate 18, height 5' 5.35" (1.66 m), weight 171 lb 6 oz (77.7 kg), SpO2 95%. Body mass index is 28.21 kg/m.     Physical Exam Vitals reviewed.  Constitutional:      Appearance: She is well-developed.     Comments: Pleasant.  HENT:     Head: Normocephalic and atraumatic.     Right Ear: Tympanic membrane, ear canal and external ear normal. No drainage, swelling or tenderness. Tympanic membrane is not injected, scarred, erythematous, retracted or bulging.     Left Ear: Tympanic membrane, ear canal and external ear normal. No drainage, swelling or tenderness. Tympanic membrane is not injected, scarred, erythematous, retracted or bulging.     Nose:  No nasal deformity, septal deviation, mucosal edema or rhinorrhea.     Right Turbinates: Enlarged, swollen and pale.     Left Turbinates: Enlarged, swollen and pale.     Right Sinus: No maxillary sinus tenderness or frontal sinus tenderness.     Left Sinus: No maxillary sinus tenderness or frontal sinus tenderness.     Mouth/Throat:     Mouth: Mucous  membranes are not pale and not dry.     Pharynx: Uvula midline.  Eyes:     General:        Right eye: No discharge.        Left eye: No discharge.     Conjunctiva/sclera: Conjunctivae normal.     Right eye: Right conjunctiva is not injected. No chemosis.    Left eye: Left conjunctiva is not injected. No chemosis.    Pupils: Pupils are equal, round, and reactive to light.  Cardiovascular:     Rate and Rhythm: Normal rate and regular rhythm.     Heart sounds: Normal heart sounds.  Pulmonary:     Effort: Pulmonary effort is normal. No tachypnea, accessory muscle usage or respiratory distress.     Breath sounds: Normal breath sounds. No wheezing, rhonchi or rales.     Comments: Moving Chest:     Chest wall: No tenderness.  Abdominal:     Tenderness: There is no abdominal tenderness. There is no guarding or rebound.  Lymphadenopathy:     Head:     Right side of head: No submandibular, tonsillar or occipital adenopathy.     Left side of head: No submandibular, tonsillar or occipital adenopathy.     Cervical: No cervical adenopathy.  Skin:    Coloration: Skin is not pale.     Findings: No abrasion, erythema, petechiae or rash. Rash is not papular, urticarial or vesicular.  Neurological:     Mental Status: She is alert.  Psychiatric:        Behavior: Behavior is cooperative.      Diagnostic studies:   Allergy Studies:     Airborne Adult Perc - 09/08/22 0915     Time Antigen Placed 0915    Allergen Manufacturer Waynette Buttery    Location Back    Number of Test 55    1. Control-Buffer 50% Glycerol Negative    2. Control-Histamine 2+    3. Bahia Negative    4. [place] 2+    5. Johnson Negative    6. Kentucky Blue Negative    7. Meadow Fescue 3+    8. Perennial Rye 3+    9. Timothy 3+    10. Ragweed Mix Negative    11. Cocklebur Negative    12. Plantain,  English Negative    13. Baccharis Negative    14. Dog Fennel Negative    15. Russian Thistle Negative    16. Lamb's  Quarters Negative    17. Sheep Sorrell Negative    18. Rough Pigweed Negative    19. Marsh Elder, Rough Negative    20. Mugwort, Common Negative    21. Box, Elder Negative    22. Cedar, red Negative    23. Sweet Gum Negative    24. Pecan Pollen Negative    25. Pine Mix Negative    26. Walnut, Black Pollen Negative    27. Red Mulberry Negative    28. Ash Mix Negative    29. Birch Mix Negative    30. Beech American Negative    31. Cottonwood,  Eastern Negative    32. Hickory, White Negative    33. Maple Mix Negative    34. Oak, Guinea[place] Mix Negative    35. Sycamore Eastern Negative    36. Alternaria Alternata Negative    37. Cladosporium Herbarum Negative    38. Aspergillus Mix Negative    39. Penicillium Mix Negative    40. Bipolaris Sorokiniana (Helminthosporium) Negative    41. Drechslera Spicifera (Curvularia) Negative    42. Mucor Plumbeus Negative    43. Fusarium Moniliforme Negative    44. Aureobasidium Pullulans (pullulara) Negative    45. Rhizopus Oryzae Negative    46. Botrytis Cinera Negative    47. Epicoccum Nigrum Negative    48. Phoma Betae 2+    49. Dust Mite Mix Negative    50. Cat Hair 10,000 BAU/ml 2+    51.  Dog Epithelia Negative    52. Mixed Feathers Negative    53. Horse Epithelia Negative    54. Cockroach, German Negative    55. Tobacco Leaf Negative             Intradermal - 09/08/22 0944     Time Antigen Placed 0945    Allergen Manufacturer Waynette Buttery    Location Arm    Number of Test 14    Control Negative    Bahia 3+    Johnson 3+    7 Grass 3+    Ragweed Mix 2+    Weed Mix Negative    Tree Mix Negative    Mold 1 Negative    Mold 2 Negative    Mold 3 Negative    Mold 4 3+    Mite Mix Negative    Dog Negative    Cockroach Negative             Food Adult Perc - 09/08/22 0900     Time Antigen Placed 1610    Allergen Manufacturer Waynette Buttery    Location Back    Number of allergen test 19    Control-Histamine 2+    1. Peanut  Negative    2. Soybean Negative    3. Wheat Negative    4. Sesame Negative    5. Milk, Cow Negative    6. Casein Negative    7. Egg White, Chicken Negative    8. Shellfish Mix Negative    9. Fish Mix Negative    10. Cashew Negative    11. Walnut Food Negative    12. Almond Negative    13. Hazelnut Negative    14. Pecan Food Negative    15. Pistachio Negative    16. [place] Nut Negative    17. Coconut Negative    61. Blueberry Negative             Allergy testing results were read and interpreted by myself, documented by clinical staff.         Malachi Bonds, MD [facility]

## 2022-09-08 NOTE — Patient Instructions (Addendum)
1. Anaphylaxis - unknown trigger - I am not sure what is going on with you, but testing to the most common foods as well as blueberry was negative. - We are to get some lab work to rule out weird causes of anaphylaxis. - I think you can eat blueberries at home and see how you do. - Make sure that this is during the day and make sure your husband is with you and your EpiPen is ready. - Emergency anaphylaxis plan provided. - EpiPen training reviewed. - This could be due to oral allergy syndrome, but I am not entirely sure since this typically is not a problem if the food is cooked. - Also, there is not typically an issue with oral allergy syndrome. - The oral allergy syndrome (OAS) or pollen-food allergy syndrome (PFAS) is a relatively common form of food allergy, particularly in adults.  - It typically occurs in people who have pollen allergies when the immune system "sees" proteins on the food that look like proteins on the pollen.  - This results in the allergy antibody (IgE) binding to the food instead of the pollen.  - Patients typically report itching and/or mild swelling of the mouth and throat immediately following ingestion of certain uncooked fruits (including nuts) or raw vegetables.  - Only a very small number of affected individuals experience systemic allergic reactions, such as anaphylaxis which occurs with true food allergies.       2. Seasonal and perennial allergic rhinitis - Testing today showed: grasses, ragweed, indoor molds, and cat - Copy of test results provided.  - Avoidance measures provided. - Continue with: Xyzal (levocetirizine) 5mg  tablet once daily and Singulair (montelukast) 10mg  daily - You can use an extra dose of the antihistamine, if needed, for breakthrough symptoms.  - Consider nasal saline rinses 1-2 times daily to remove allergens from the nasal cavities as well as help with mucous clearance (this is especially helpful to do before the nasal sprays are  given) - Consider allergy shots as a means of long-term control. - Allergy shots "re-train" and "reset" the immune system to ignore environmental allergens and decrease the resulting immune response to those allergens (sneezing, itchy watery eyes, runny nose, nasal congestion, etc).    - Allergy shots improve symptoms in 75-85% of patients.  - We can discuss more at the next appointment if the medications are not working for you.   3. Return in about 2 months (around 11/09/2022). You can have the follow up appointment with Dr. Dellis Anes or a Nurse Practicioner (our Nurse Practitioners are excellent and always have Physician oversight!).    Please inform us of any Emergency Department visits, hospitalizations, or changes in symptoms. Call us before going to the ED for breathing or allergy symptoms since we might be able to fit you in for a sick visit. Feel free to contact us anytime with any questions, problems, or concerns.  It was a pleasure to meet you today!  Websites that have reliable patient information: 1. American Academy of Asthma, Allergy, and Immunology: www.aaaai.org 2. Food Allergy Research and Education (FARE): foodallergy.org 3. Mothers of Asthmatics: http://www.asthmacommunitynetwork.org 4. American College of Allergy, Asthma, and Immunology: www.acaai.org   COVID-19 Vaccine Information can be found at: [email or web address] For questions related to vaccine distribution or appointments, please email [email or web address] or call [phone].   We realize that you might be concerned about having an allergic reaction to the COVID19 vaccines. To help with that concern, WE ARE OFFERING THE COVID19 VACCINES IN  OUR OFFICE! Ask the front desk for dates!     "Like" [place] on Facebook and Instagram for our latest updates!      A healthy democracy works best when [employer] participate! Make sure you are registered to vote! If  you have moved or changed any of your contact information, you will need to get this updated before voting!  In some cases, you MAY be able to register to vote online: [email or web address]     Reducing Pollen Exposure  The American Academy of Allergy, Asthma and Immunology suggests the following steps to reduce your exposure to pollen during allergy seasons.    Do not hang sheets or clothing out to dry; pollen may collect on these items. Do not mow lawns or spend time around freshly cut grass; mowing stirs up pollen. Keep windows closed at night.  Keep car windows closed while driving. Minimize morning activities outdoors, a time when pollen counts are usually at their highest. Stay indoors as much as possible when pollen counts or humidity is high and on windy days when pollen tends to remain in the air longer. Use air conditioning when possible.  Many air conditioners have filters that trap the pollen spores. Use a HEPA room air filter to remove pollen form the indoor air you breathe.  Control of Mold Allergen   Mold and fungi can grow on a variety of surfaces provided certain temperature and moisture conditions exist.  Outdoor molds grow on plants, decaying vegetation and soil.  The major outdoor mold, Alternaria and Cladosporium, are found in very high numbers during hot and dry conditions.  Generally, a late Summer - Fall peak is seen for common outdoor fungal spores.  Rain will temporarily lower outdoor mold spore count, but counts rise rapidly when the rainy period ends.  The most important indoor molds are Aspergillus and Penicillium.  Dark, humid and poorly ventilated basements are ideal sites for mold growth.  The next most common sites of mold growth are the bathroom and the kitchen.   Indoor (Perennial) Mold Control   Positive indoor molds via skin testing: Fusarium, Aureobasidium (Pullulara), Rhizopus, and Phoma  Maintain humidity below 50%. Clean  washable surfaces with 5% bleach solution. Remove sources e.g. contaminated carpets.    Control of Dog or Cat Allergen  Avoidance is the best way to manage a dog or cat allergy. If you have a dog or cat and are allergic to dog or cats, consider removing the dog or cat from the home. If you have a dog or cat but don't want to find it a new home, or if your family wants a pet even though someone in the household is allergic, here are some strategies that may help keep symptoms at bay:  Keep the pet out of your bedroom and restrict it to only a few rooms. Be advised that keeping the dog or cat in only one room will not limit the allergens to that room. Don't pet, hug or kiss the dog or cat; if you do, wash your hands with soap and water. High-efficiency particulate air (HEPA) cleaners run continuously in a bedroom or living room can reduce allergen levels over time. Regular use of a high-efficiency vacuum cleaner or a central vacuum can reduce allergen levels. Giving your dog or cat a bath at least once a week can reduce airborne allergen.

## 2022-09-09 ENCOUNTER — Telehealth: Payer: Self-pay

## 2022-09-09 NOTE — Telephone Encounter (Signed)
Patient called stating she was seen in [place] yesterday and had intradermals done. She states her arm isn't itching anymore after taking benadryl, but she does still have the bumps on her arms from the intradermals. They are raised and warm to the touch. I did recommend her applying a ice pack and then alternate to a warm compress throughout the day.   Is there anymore recommendations for the patient?

## 2022-09-10 LAB — TRYPTASE: Tryptase: 7.6 ug/L (ref 2.2–13.2)

## 2022-09-10 LAB — ALLERGEN, BLUEBERRY, RF288

## 2022-09-13 ENCOUNTER — Other Ambulatory Visit (HOSPITAL_COMMUNITY): Payer: Self-pay

## 2022-09-13 ENCOUNTER — Other Ambulatory Visit: Payer: Self-pay

## 2022-09-13 MED ORDER — TRIAMCINOLONE ACETONIDE 0.5 % EX OINT
1.0000 | TOPICAL_OINTMENT | Freq: Two times a day (BID) | CUTANEOUS | 0 refills | Status: AC
  Filled 2022-09-13: qty 30, 15d supply, fill #0

## 2022-09-13 NOTE — Telephone Encounter (Signed)
Spoke with patient, informed her of Dr. Gillermina Hu recommendation. Patient verbalized understanding.

## 2022-09-13 NOTE — Telephone Encounter (Signed)
Let's add a topical steroid. I sent in triamcinolone BID for 2 weeks.  Malachi Bonds, MD [facility]

## 2022-09-16 ENCOUNTER — Telehealth: Payer: Self-pay | Admitting: Pharmacy Technician

## 2022-09-16 ENCOUNTER — Other Ambulatory Visit (HOSPITAL_COMMUNITY): Payer: Self-pay

## 2022-09-16 NOTE — Telephone Encounter (Signed)
Pharmacy Patient Advocate Encounter   Received notification from CoverMyMeds that prior authorization for UBRELVY 100MG  is required/requested.   Insurance verification completed.   The patient is insured through [facility] .   Per test claim: PA required; PA submitted to [facility] via CoverMyMeds Key/confirmation #/EOC [id number] Status is pending

## 2022-09-17 ENCOUNTER — Other Ambulatory Visit (HOSPITAL_COMMUNITY): Payer: Self-pay

## 2022-09-17 NOTE — Telephone Encounter (Signed)
Pharmacy Patient Advocate Encounter  Received notification from [facility] that Prior Authorization for UBRELVY 100MG  has been APPROVED from 7.26.24 to 7.25.25. Ran test claim, Copay is $0.  PA #/Case ID/Reference #: [id number]

## 2022-09-22 ENCOUNTER — Ambulatory Visit: Payer: Commercial Managed Care - PPO | Admitting: Nurse Practitioner

## 2022-09-22 ENCOUNTER — Ambulatory Visit: Payer: Commercial Managed Care - PPO | Admitting: Allergy & Immunology

## 2022-09-23 ENCOUNTER — Other Ambulatory Visit (HOSPITAL_COMMUNITY): Payer: Self-pay

## 2022-09-23 ENCOUNTER — Encounter: Payer: Self-pay | Admitting: Pharmacist

## 2022-09-23 ENCOUNTER — Other Ambulatory Visit: Payer: Self-pay

## 2022-09-24 ENCOUNTER — Other Ambulatory Visit: Payer: Self-pay

## 2022-09-28 ENCOUNTER — Telehealth: Payer: Self-pay

## 2022-09-28 NOTE — Telephone Encounter (Signed)
[name] wants to know if Dr Lorin Picket wants to check her iron before her appt with [name] Friday she is feeling tired lately  [name] [phone]

## 2022-09-29 ENCOUNTER — Other Ambulatory Visit: Payer: Self-pay | Admitting: Nurse Practitioner

## 2022-09-29 ENCOUNTER — Encounter: Payer: Self-pay | Admitting: Nurse Practitioner

## 2022-09-30 ENCOUNTER — Other Ambulatory Visit: Payer: Self-pay

## 2022-09-30 ENCOUNTER — Other Ambulatory Visit (HOSPITAL_COMMUNITY): Payer: Self-pay

## 2022-10-01 ENCOUNTER — Ambulatory Visit: Payer: Commercial Managed Care - PPO | Admitting: Nurse Practitioner

## 2022-10-01 ENCOUNTER — Other Ambulatory Visit: Payer: Self-pay

## 2022-10-01 VITALS — BP 106/74 | HR 115 | Temp 97.9°F | Ht 65.35 in | Wt 172.4 lb

## 2022-10-01 DIAGNOSIS — F419 Anxiety disorder, unspecified: Secondary | ICD-10-CM

## 2022-10-01 DIAGNOSIS — E611 Iron deficiency: Secondary | ICD-10-CM

## 2022-10-01 NOTE — Telephone Encounter (Signed)
Responded through my chart message.

## 2022-10-01 NOTE — Progress Notes (Unsigned)
Subjective:    Patient ID: [name], female    DOB: Jun 22, 1970, 52 y.o.   MRN: [id number]  HPI Pt comes in today for a follow up visit.  Pt is concerned about her iron levels and would like to discuss this today. Was diagnosed with iron deficiency anemia back in June. Taking daily iron supplement.  Has appointment with GI early November which was the earliest appointment she could get. Is on a call list.  Wonders if she might have some stomach issues due to the extreme stress she is under. 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 Recently increased her Xanax 0.5 mg to 2 twice a day due to extreme anxiety. Limited exercise/activity.  Has a good support system with her husband and daughter.  Denies suicidal or homicidal thoughts or ideation.     10/01/2022    2:02 PM 09/09/2017    3:50 PM  GAD 7 : Generalized Anxiety Score  Nervous, Anxious, on Edge 1   Control/stop worrying 2   Worry too much - different things 2   Trouble relaxing 0   Restless 0   Easily annoyed or irritable 0   Afraid - awful might happen 0   Total GAD 7 Score 5      Information is confidential and restricted. Go to Review Flowsheets to unlock data.      10/01/2022    2:01 PM  Depression screen PHQ 2/9  Decreased Interest 0  Down, Depressed, Hopeless 0  PHQ - 2 Score 0  Tired, decreased energy 2  Change in appetite 0  Feeling bad or failure about yourself  0  Trouble concentrating 0  Moving slowly or fidgety/restless 0  Suicidal thoughts 0     Review of Systems  Constitutional:  Positive for activity change, appetite change and fatigue.  HENT:  Negative for sore throat and trouble swallowing.   Respiratory:  Positive for chest tightness. Negative for cough and shortness of breath.        Has occasional brief chest tightness with anxiety; unrelated to activity.   Cardiovascular:  Negative for chest pain, palpitations and leg swelling.   Gastrointestinal:        Stools dark but on oral iron therapy.   Psychiatric/Behavioral:  Positive for sleep disturbance. Negative for suicidal ideas. The patient is nervous/anxious.        Objective:   Physical Exam NAD. Alert, oriented. Very fatigued in appearance.  Speech clear.  Making good eye contact.  Dressed appropriately for the weather.  Thoughts logical coherent and relevant.  Lungs clear.  Heart regular rate and rhythm. Today's Vitals   10/01/22 1350  BP: 106/74  Pulse: (!) 115  Temp: 97.9 F (36.6 C)  SpO2: 100%  Weight: 172 lb 6.4 oz (78.2 kg)  Height: 5' 5.35" (1.66 m)   Body mass index is 28.38 kg/m.        Assessment & Plan:   Problem List Items Addressed This Visit       Other   Anxiety   Relevant Medications   ALPRAZolam (XANAX) 1 MG tablet   Other Visit Diagnoses     Iron deficiency    -  Primary   Relevant Orders   CBC with Differential/Platelet (Completed)   Iron, TIBC and Ferritin Panel (Completed)      Increase Xanax to 1 mg p.o. twice daily as needed.  Patient understands this is a temporary solution with  plans to reduce the dose in the next few months.  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 Follow-up with gastroenterology as planned. Repeat lab work today. Repeat iFOBT testing for blood in the stool. Patient defers daily medication for anxiety at this time.  Plan to reconsider this if no improvement in her symptoms. Return in about 3 months (around 01/01/2023). Call back sooner if needed.

## 2022-10-02 ENCOUNTER — Encounter: Payer: Self-pay | Admitting: Nurse Practitioner

## 2022-10-03 ENCOUNTER — Encounter: Payer: Self-pay | Admitting: Nurse Practitioner

## 2022-10-03 MED ORDER — ALPRAZOLAM 1 MG PO TABS: 1.0000 mg | ORAL_TABLET | Freq: Two times a day (BID) | ORAL | 0 refills | Status: DC | PRN

## 2022-10-08 ENCOUNTER — Other Ambulatory Visit (HOSPITAL_COMMUNITY): Payer: Self-pay

## 2022-10-11 ENCOUNTER — Other Ambulatory Visit: Payer: Self-pay | Admitting: Nurse Practitioner

## 2022-10-11 DIAGNOSIS — G629 Polyneuropathy, unspecified: Secondary | ICD-10-CM

## 2022-10-12 ENCOUNTER — Other Ambulatory Visit: Payer: Self-pay

## 2022-10-15 ENCOUNTER — Other Ambulatory Visit (HOSPITAL_COMMUNITY): Payer: Self-pay

## 2022-10-15 ENCOUNTER — Other Ambulatory Visit: Payer: Self-pay

## 2022-10-15 MED ORDER — CYCLOBENZAPRINE HCL 10 MG PO TABS
10.0000 mg | ORAL_TABLET | Freq: Every day | ORAL | 0 refills | Status: DC | PRN
Start: 2022-10-15 — End: 2023-03-13
  Filled 2022-10-15: qty 90, 90d supply, fill #0

## 2022-10-20 ENCOUNTER — Other Ambulatory Visit (HOSPITAL_COMMUNITY): Payer: Self-pay

## 2022-10-20 ENCOUNTER — Other Ambulatory Visit: Payer: Self-pay | Admitting: Nurse Practitioner

## 2022-10-20 DIAGNOSIS — G629 Polyneuropathy, unspecified: Secondary | ICD-10-CM

## 2022-10-22 ENCOUNTER — Telehealth: Payer: Self-pay | Admitting: Family Medicine

## 2022-10-22 ENCOUNTER — Encounter: Payer: Self-pay | Admitting: Nurse Practitioner

## 2022-10-22 NOTE — Telephone Encounter (Signed)
Mychart message sent.

## 2022-10-22 NOTE — Telephone Encounter (Signed)
Patient would like results of stool culture when it comes back

## 2022-10-26 ENCOUNTER — Other Ambulatory Visit: Payer: Self-pay

## 2022-10-26 DIAGNOSIS — Z1211 Encounter for screening for malignant neoplasm of colon: Secondary | ICD-10-CM

## 2022-10-26 LAB — IFOBT (OCCULT BLOOD): IFOBT: NEGATIVE

## 2022-10-27 ENCOUNTER — Other Ambulatory Visit: Payer: Self-pay

## 2022-10-27 ENCOUNTER — Encounter: Payer: Self-pay | Admitting: Nurse Practitioner

## 2022-10-27 ENCOUNTER — Other Ambulatory Visit (HOSPITAL_COMMUNITY): Payer: Self-pay

## 2022-10-27 DIAGNOSIS — G629 Polyneuropathy, unspecified: Secondary | ICD-10-CM

## 2022-10-27 MED ORDER — ALPRAZOLAM 1 MG PO TABS: 1.0000 mg | ORAL_TABLET | Freq: Two times a day (BID) | ORAL | 0 refills | Status: DC | PRN

## 2022-10-27 MED ORDER — LIDOCAINE-PRILOCAINE 2.5-2.5 % EX CREA
TOPICAL_CREAM | CUTANEOUS | 2 refills | Status: DC
Start: 2022-10-27 — End: 2022-10-27
  Filled 2022-10-27: qty 30, fill #0

## 2022-10-27 MED ORDER — LIDOCAINE-PRILOCAINE 2.5-2.5 % EX CREA
TOPICAL_CREAM | CUTANEOUS | 2 refills | Status: DC
  Filled 2022-10-27: qty 30, 30d supply, fill #0
  Filled 2022-11-23: qty 30, 30d supply, fill #1

## 2022-11-06 ENCOUNTER — Other Ambulatory Visit (HOSPITAL_COMMUNITY): Payer: Self-pay

## 2022-11-08 ENCOUNTER — Other Ambulatory Visit: Payer: Self-pay

## 2022-11-08 ENCOUNTER — Other Ambulatory Visit (HOSPITAL_COMMUNITY): Payer: Self-pay

## 2022-11-18 ENCOUNTER — Other Ambulatory Visit (HOSPITAL_COMMUNITY): Payer: Self-pay

## 2022-11-23 ENCOUNTER — Other Ambulatory Visit: Payer: Self-pay

## 2022-11-23 ENCOUNTER — Encounter: Payer: Self-pay | Admitting: Neurology

## 2022-11-23 ENCOUNTER — Other Ambulatory Visit: Payer: Self-pay | Admitting: Nurse Practitioner

## 2022-11-23 ENCOUNTER — Encounter: Payer: Self-pay | Admitting: Nurse Practitioner

## 2022-11-23 ENCOUNTER — Other Ambulatory Visit (HOSPITAL_COMMUNITY): Payer: Self-pay

## 2022-11-23 DIAGNOSIS — E782 Mixed hyperlipidemia: Secondary | ICD-10-CM

## 2022-11-24 ENCOUNTER — Other Ambulatory Visit: Payer: Self-pay | Admitting: Neurology

## 2022-11-24 ENCOUNTER — Other Ambulatory Visit: Payer: Self-pay | Admitting: Nurse Practitioner

## 2022-11-24 ENCOUNTER — Other Ambulatory Visit (HOSPITAL_COMMUNITY): Payer: Self-pay

## 2022-11-24 ENCOUNTER — Other Ambulatory Visit: Payer: Self-pay

## 2022-11-24 DIAGNOSIS — G629 Polyneuropathy, unspecified: Secondary | ICD-10-CM

## 2022-11-24 MED ORDER — LIDOCAINE-PRILOCAINE 2.5-2.5 % EX CREA
TOPICAL_CREAM | CUTANEOUS | 2 refills | Status: DC
  Filled 2022-11-24: qty 60, fill #0
  Filled 2022-12-25: qty 60, 30d supply, fill #0
  Filled 2023-02-06: qty 60, 30d supply, fill #1
  Filled 2023-02-20 – 2023-03-13 (×2): qty 60, 30d supply, fill #2

## 2022-11-24 MED ORDER — AMITRIPTYLINE HCL 100 MG PO TABS
100.0000 mg | ORAL_TABLET | Freq: Every day | ORAL | 5 refills | Status: DC
  Filled 2022-11-24 (×2): qty 30, 30d supply, fill #0
  Filled 2022-12-19: qty 30, 30d supply, fill #1
  Filled 2023-01-16: qty 30, 30d supply, fill #2
  Filled 2023-02-13: qty 30, 30d supply, fill #3
  Filled 2023-03-20: qty 30, 30d supply, fill #4
  Filled 2023-04-09: qty 30, 30d supply, fill #5

## 2022-11-24 MED ORDER — OMEGA-3-ACID ETHYL ESTERS 1 G PO CAPS
2.0000 | ORAL_CAPSULE | Freq: Two times a day (BID) | ORAL | 1 refills | Status: DC
  Filled 2022-11-24: qty 360, 90d supply, fill #0
  Filled 2023-02-22: qty 360, 90d supply, fill #1

## 2022-11-24 MED ORDER — MOUNJARO 15 MG/0.5ML ~~LOC~~ SOAJ
15.0000 mg | SUBCUTANEOUS | 1 refills | Status: DC
  Filled 2022-11-24: qty 6, 84d supply, fill #0
  Filled 2023-02-13: qty 6, 84d supply, fill #1

## 2022-11-25 ENCOUNTER — Other Ambulatory Visit (HOSPITAL_COMMUNITY): Payer: Self-pay

## 2022-11-26 ENCOUNTER — Ambulatory Visit: Payer: Commercial Managed Care - PPO | Admitting: Family Medicine

## 2022-12-15 NOTE — Progress Notes (Signed)
NEUROLOGY FOLLOW UP OFFICE NOTE  [name] [id number]  Assessment/Plan:   Hemiplegic migraine.  TIA felt to be less likely.  She does have stroke risk factors such as high cholesterol and diabetes, but based on age, associated headache and normal MRI without any evidence of chronic microvascular ischemic changes, migraine more likely Migraine without aura, without status migrainosus, not intractable Diabetic polyneuropathy   Migraine prevention:  Start Emgality Continue amitriptyline 100mg  at bedtime for another 3 weeks, if ineffective, will change to duloxetine Migraine rescue:  Ubrelvy 100mg .  Triptans are contraindicated. Neuropathic pain: gabapentin to 800mg  three times daily, increase amitriptyline to 75mg  at bedtime Limit use of pain relievers to no more than 2 days out of week to prevent risk of rebound or medication-overuse headache. Keep headache diary Follow up 6 months       Subjective:  [name] is a 52 year old right-handed female with DM II, hypercholesterolemia, migraines and anxiety who follows up for migraines and neuropathy.  UPDATE: Migraines: Increased amitriptyline, now on 100mg .  Headaches frequent.  Pounding back of head or across forehead.  Usually right ptosis, phonophobia.  No nausea or photophobia. Intensity:  severe Duration:  2 hours with Bernita Raisin Frequency:  2 times a month, each 2 consecutive days  Diabetic neuropathy: Taking gabapentin 800mg  three times daily, amitriptyline 100mg  at bedtime, lidocaine-prilocaine cream  Increased amitriptyline.  Still needs to use the lidocaine cream every night.     Current NSAIDS/analgesics:  ASA 81mg  daily Current triptans:  rizatriptan Current ergotamine:  none Current anti-emetic:  none Current muscle relaxants:  Flexeril 10mg  QD PRN Current Antihypertensive medications:  none Current Antidepressant medications:  amitriptyline 100mg  QHS Current Anticonvulsant medications:  gabapentin 800mg   three times daily (pain) Current anti-CGRP:  Ubrelvy 100mg  Other medications:  alprazolam PRN (sleep), rosuvastatin 10mg   HISTORY:  On 11/16/2021, she developed right sided facial droop, slurred speech, blurred vision and right sided upper and lower extremity numbness and weakness.  She also had severe pounding headache on top of her head.  She presented to [facility] where she was a code stroke and had an NIHSS of 5.  CT head showed no possible hypodensity in the posterior left temporal lobe, which was determined to be artifact.  She received TNK and was transferred to [facility].  Symptoms lasted no more than 2 to 3 hours.  CTA head and neck showed no LVO or hemodynamically significant stenosis.  MRI of brain showed no acute intracranial abnormality.  2D echo showed LVEF 65-70%.  LDL was 38 and Hgb A1c was 6.8.  Discharged on ASA 81mg  and Plavix 75mg  daily for 21 days followed by ASA 81mg  monotherapy.     She does have history of migraines which are manageable.  They are right sided associated with blurred vision and photophobia lasting 2 hours with rizatriptan and occurs no more than 2 days a month.   She also has diabetic polyneuropathy since at least 2019.  She endorses numbness and pain in the feet.  Labs were unremarkable, including B12 >2000, MMA 138, folate 10.3, B1 142.3, B6 6.1, negative IFE for M-spike, TSH 2.190, negative ANA, negative RF, sed rate 13, negative SSA/SSB antibodies, negative pan-ANCA panel, negative Lyme IgG/IgM, nonreactive RPR, negative Hep C antibody, negative Celiac panel, negative heavy metals.  Hgb A1c increased in 2022 but has steadily declined from 7.5 to 6.8.     Past NSAIDS/analgesics:  naproxen, ibuprofen, diclofenac, indomethacin, acetaminophen Past abortive triptans:  sumatriptan Past anti-emetic:  Zofran Past antihypertensive medications:  metoprolol, losartan, valsartan Past antidepressant medications:  sertraline, escitalopram        PAST MEDICAL HISTORY: Past Medical History:  Diagnosis Date   Allergy    Anxiety    Diabetes mellitus without complication (HCC)    Gout    History of migraine headaches    Hx of migraines    Hypertension not at goal 03/26/2016   IBS (irritable bowel syndrome)    Impaired fasting glucose     MEDICATIONS: Current Outpatient Medications on File Prior to Visit  Medication Sig Dispense Refill   amitriptyline (ELAVIL) 100 MG tablet Take 1 tablet (100 mg total) by mouth at bedtime. 30 tablet 5   allopurinol (ZYLOPRIM) 300 MG tablet Take 1 tablet (300 mg total) by mouth 2 (two) times daily. 180 tablet 1   ALPRAZolam (XANAX) 1 MG tablet Take 1 tablet (1 mg total) by mouth 2 (two) times daily as needed for anxiety. 60 tablet 0   Continuous Glucose Sensor (FREESTYLE LIBRE 3 SENSOR) MISC Apply as directed every 14 days (Patient not taking: Reported on 10/01/2022) 6 each 1   cyanocobalamin (VITAMIN B12) 1000 MCG/ML injection Inject 1 mL into the muscle every 30 days. 3 mL 4   cyclobenzaprine (FLEXERIL) 10 MG tablet Take 1 tablet (10 mg total) by mouth daily as needed for muscle spasms. 90 tablet 0   EPINEPHrine 0.3 mg/0.3 mL IJ SOAJ injection Inject 0.3 mg into the muscle as needed for anaphylaxis. 2 each 1   famotidine (PEPCID) 40 MG tablet Take 1 tablet (40 mg total) by mouth at bedtime. 90 tablet 1   gabapentin (NEURONTIN) 800 MG tablet Take 1 tablet (800 mg total) by mouth 3 (three) times daily. 270 tablet 1   glucose blood (FREESTYLE LITE) test strip Use twice daily as directed to check blood sugar 100 each 1   Iron, Ferrous Sulfate, 325 (65 Fe) MG TABS Take one tablet (325 mg) by mouth daily. 90 tablet 1   levocetirizine (XYZAL) 5 MG tablet Take 1 tablet (5 mg total) by mouth daily as needed for allergies (Can take an etxra dose during flare ups). 180 tablet 1   lidocaine-prilocaine (EMLA) cream Apply to feet as needed for nerve pain 60 g 2   LOTEMAX SM 0.38 % GEL Place 1 Application into  the left eye daily. (Patient not taking: Reported on 10/01/2022)     magic mouthwash (nystatin, hydrocortisone, diphenhydrAMINE) suspension Swish and spit 5 MLs by mouth  3 times a day as needed 540 mL 2   metFORMIN (GLUCOPHAGE) 1000 MG tablet Take 1 tablet (1,000 mg total) by mouth 2 (two) times daily before a meal. 180 tablet 1   montelukast (SINGULAIR) 10 MG tablet Take 1 tablet (10 mg total) by mouth daily. 90 tablet 1   nystatin (MYCOSTATIN) 100000 UNIT/ML suspension Take 5 mLs (500,000 Units total) by mouth 4 (four) times daily. 140 mL 2   omega-3 acid ethyl esters (LOVAZA) 1 g capsule Take 2 capsules (2 g total) by mouth 2 (two) times daily. 360 capsule 1   pantoprazole (PROTONIX) 40 MG tablet Take 1 tablet (40 mg total) by mouth 2 (two) times daily before a meal. 180 tablet 1   Perfluorohexyloctane (MIEBO) 1.338 GM/ML SOLN Place 1 drop in both eyes up to 4 times daily as directed. 3 mL PRN   rosuvastatin (CRESTOR) 10 MG tablet Take 1 tablet (10 mg total) by mouth at bedtime. 90 tablet  1   tirzepatide (MOUNJARO) 15 MG/0.5ML Pen Inject 15 mg into the skin once a week. 6 mL 1   Ubrogepant (UBRELVY) 100 MG TABS Take 1 tablet (100 mg total) by mouth as needed. May repeat after 2 hours.  Maximum 2 tablets in 24 hours. 16 tablet 11   Varenicline Tartrate (TYRVAYA) 0.03 MG/ACT SOLN Use 2 times a day as directed 8.4 mL 12   Varenicline Tartrate (TYRVAYA) 0.03 MG/ACT SOLN Use twice daily as directed. 25.2 mL 3   [DISCONTINUED] cetirizine (ZYRTEC) 10 MG tablet Take 1 tablet (10 mg total) by mouth daily. 90 tablet 3   No current facility-administered medications on file prior to visit.    ALLERGIES: Allergies  Allergen Reactions   Omnicef [Cefdinir] Other (See Comments)    Patient states antibiotic does not work. Has tried multiple times.   Losartan Rash    Broke out on face only; no allergic reaction    FAMILY HISTORY: Family History  Problem Relation Age of Onset   Diabetes Mother     Neuropathy Mother    Heart failure Mother    Hypertension Mother    Cancer Mother    Asthma Father    Atrial fibrillation Father    Hypertension Father    Colonic polyp Father       Objective:  Blood pressure 136/87, pulse (!) 115, resp. rate 18, height 5\' 5"  (1.651 m), weight 177 lb (80.3 kg), SpO2 98%. General: No acute distress.  Patient appears well Head:  Normocephalic/atraumatic Neck:  Supple.  No paraspinal tenderness.  Full range of motion. Heart:  Regular rate and rhythm. Neuro:  Alert and oriented.  Speech fluent and not dysarthric.  Language intact.  CN II-XII intact.  Bulk and tone normal.  Muscle strength 5/5 throughout.  Deep tendon reflexes 2+ throughout.  Gait normal.  Romberg negative.   Shon Millet, DO  CC: Lilyan Punt, MD

## 2022-12-17 ENCOUNTER — Ambulatory Visit: Payer: Commercial Managed Care - PPO | Admitting: Neurology

## 2022-12-17 ENCOUNTER — Encounter: Payer: Self-pay | Admitting: Neurology

## 2022-12-17 ENCOUNTER — Other Ambulatory Visit: Payer: Self-pay

## 2022-12-17 ENCOUNTER — Other Ambulatory Visit (HOSPITAL_COMMUNITY): Payer: Self-pay

## 2022-12-17 VITALS — BP 136/87 | HR 115 | Resp 18 | Ht 65.0 in | Wt 177.0 lb

## 2022-12-17 DIAGNOSIS — G43009 Migraine without aura, not intractable, without status migrainosus: Secondary | ICD-10-CM

## 2022-12-17 DIAGNOSIS — E1142 Type 2 diabetes mellitus with diabetic polyneuropathy: Secondary | ICD-10-CM

## 2022-12-17 MED ORDER — EMGALITY 120 MG/ML ~~LOC~~ SOAJ
240.0000 mg | Freq: Once | SUBCUTANEOUS | 0 refills | Status: AC
  Filled 2022-12-17: qty 2, 30d supply, fill #0

## 2022-12-17 NOTE — Patient Instructions (Signed)
Start Emgality - 2 injections for first dose then 1 injection every 28 days thereafter.  When you pick up the first dose, contact me and I will send in the standing order prescription Ubrelvy If neuropathy not improved in 3 weeks, contact me and we will change amitriptyline to another medication

## 2022-12-20 ENCOUNTER — Other Ambulatory Visit (HOSPITAL_COMMUNITY): Payer: Self-pay

## 2022-12-20 ENCOUNTER — Other Ambulatory Visit: Payer: Self-pay | Admitting: Nurse Practitioner

## 2022-12-21 ENCOUNTER — Other Ambulatory Visit: Payer: Self-pay

## 2022-12-24 ENCOUNTER — Ambulatory Visit: Payer: Commercial Managed Care - PPO | Admitting: Internal Medicine

## 2022-12-27 ENCOUNTER — Other Ambulatory Visit: Payer: Self-pay

## 2022-12-28 ENCOUNTER — Telehealth: Payer: Self-pay

## 2022-12-28 ENCOUNTER — Encounter: Payer: Self-pay | Admitting: Neurology

## 2022-12-28 ENCOUNTER — Ambulatory Visit: Payer: Commercial Managed Care - PPO | Admitting: Internal Medicine

## 2022-12-28 NOTE — Telephone Encounter (Signed)
PA needed for Emgality.

## 2022-12-30 ENCOUNTER — Other Ambulatory Visit (HOSPITAL_COMMUNITY): Payer: Self-pay

## 2022-12-30 ENCOUNTER — Other Ambulatory Visit: Payer: Self-pay | Admitting: Nurse Practitioner

## 2022-12-30 ENCOUNTER — Telehealth: Payer: Self-pay | Admitting: Pharmacy Technician

## 2022-12-30 ENCOUNTER — Telehealth: Payer: Self-pay

## 2022-12-30 ENCOUNTER — Encounter: Payer: Self-pay | Admitting: Nurse Practitioner

## 2022-12-30 DIAGNOSIS — E782 Mixed hyperlipidemia: Secondary | ICD-10-CM

## 2022-12-30 DIAGNOSIS — E611 Iron deficiency: Secondary | ICD-10-CM

## 2022-12-30 DIAGNOSIS — D649 Anemia, unspecified: Secondary | ICD-10-CM

## 2022-12-30 DIAGNOSIS — E1142 Type 2 diabetes mellitus with diabetic polyneuropathy: Secondary | ICD-10-CM

## 2022-12-30 NOTE — Telephone Encounter (Signed)
Pharmacy Patient Advocate Encounter   Received notification from Pt Calls Messages that prior authorization for [facility] 120MG  is required/requested.   Insurance verification completed.   The patient is insured through [facility] .   Per test claim: PA required; PA submitted to above mentioned insurance via CoverMyMeds Key/confirmation #/EOC [id number] Status is pending

## 2022-12-30 NOTE — Telephone Encounter (Signed)
PA has been submitted, and telephone encounter has been created. 

## 2022-12-30 NOTE — Telephone Encounter (Signed)
Pt has appt on 11/22 need blood work ordered and she wants her iron checked

## 2022-12-31 ENCOUNTER — Other Ambulatory Visit: Payer: Self-pay

## 2022-12-31 ENCOUNTER — Other Ambulatory Visit: Payer: Self-pay | Admitting: Nurse Practitioner

## 2022-12-31 ENCOUNTER — Other Ambulatory Visit (HOSPITAL_COMMUNITY): Payer: Self-pay

## 2022-12-31 MED ORDER — NYSTATIN 100000 UNIT/ML MT SUSP: OROMUCOSAL | 2 refills | Status: DC

## 2022-12-31 NOTE — Telephone Encounter (Signed)
Pharmacy Patient Advocate Encounter  Received notification from [facility] that Prior Authorization for Emgality 120MG /ML auto-injectors (migraine) has been APPROVED from 01/23/2023 to 06/22/2023   PA #/Case ID/Reference #: [id number]

## 2022-12-31 NOTE — Progress Notes (Signed)
Per Pharmacy loading dose will ship out Monday, patient to receive Tuesday.

## 2023-01-04 ENCOUNTER — Other Ambulatory Visit (HOSPITAL_COMMUNITY): Payer: Self-pay

## 2023-01-04 ENCOUNTER — Other Ambulatory Visit: Payer: Self-pay

## 2023-01-05 ENCOUNTER — Other Ambulatory Visit (HOSPITAL_COMMUNITY): Payer: Self-pay

## 2023-01-05 ENCOUNTER — Other Ambulatory Visit: Payer: Self-pay

## 2023-01-05 MED ORDER — EMGALITY 120 MG/ML ~~LOC~~ SOAJ
120.0000 mg | SUBCUTANEOUS | 11 refills | Status: DC
  Filled 2023-01-05: qty 2, fill #0
  Filled 2023-02-08: qty 3, 90d supply, fill #0
  Filled 2023-04-22: qty 3, 90d supply, fill #1
  Filled 2023-05-04 – 2023-07-11 (×3): qty 3, 90d supply, fill #2
  Filled 2023-07-18: qty 1, 30d supply, fill #2

## 2023-01-06 ENCOUNTER — Other Ambulatory Visit: Payer: Self-pay | Admitting: Nurse Practitioner

## 2023-01-06 ENCOUNTER — Other Ambulatory Visit (HOSPITAL_COMMUNITY): Payer: Self-pay

## 2023-01-06 DIAGNOSIS — E1142 Type 2 diabetes mellitus with diabetic polyneuropathy: Secondary | ICD-10-CM

## 2023-01-07 ENCOUNTER — Ambulatory Visit: Payer: Commercial Managed Care - PPO | Admitting: Family Medicine

## 2023-01-07 ENCOUNTER — Other Ambulatory Visit (HOSPITAL_COMMUNITY): Payer: Self-pay

## 2023-01-07 MED ORDER — METFORMIN HCL 1000 MG PO TABS
1000.0000 mg | ORAL_TABLET | Freq: Two times a day (BID) | ORAL | 1 refills | Status: DC
  Filled 2023-01-07: qty 180, 90d supply, fill #0

## 2023-01-12 DIAGNOSIS — E611 Iron deficiency: Secondary | ICD-10-CM | POA: Diagnosis not present

## 2023-01-12 DIAGNOSIS — E1142 Type 2 diabetes mellitus with diabetic polyneuropathy: Secondary | ICD-10-CM | POA: Diagnosis not present

## 2023-01-12 DIAGNOSIS — E782 Mixed hyperlipidemia: Secondary | ICD-10-CM | POA: Diagnosis not present

## 2023-01-12 DIAGNOSIS — D649 Anemia, unspecified: Secondary | ICD-10-CM | POA: Diagnosis not present

## 2023-01-13 LAB — LIPID PANEL
Chol/HDL Ratio: 3.5 ratio (ref 0.0–4.4)
Cholesterol, Total: 121 mg/dL (ref 100–199)
HDL: 35 mg/dL — ABNORMAL LOW (ref >39)
LDL Chol Calc (NIH): 63 mg/dL (ref 0–99)
Triglycerides: 130 mg/dL (ref 0–149)
VLDL Cholesterol Cal: 23 mg/dL (ref 5–40)

## 2023-01-13 LAB — CBC WITH DIFFERENTIAL/PLATELET
Basophils Absolute: 0 10*3/uL (ref 0.0–0.2)
Basos: 1 %
EOS (ABSOLUTE): 0.1 10*3/uL (ref 0.0–0.4)
Eos: 2 %
Hematocrit: 39.4 % (ref 34.0–46.6)
Hemoglobin: 12.4 g/dL (ref 11.1–15.9)
Immature Grans (Abs): 0 10*3/uL (ref 0.0–0.1)
Immature Granulocytes: 0 %
Lymphocytes Absolute: 2.2 10*3/uL (ref 0.7–3.1)
Lymphs: 28 %
MCH: 26.9 pg (ref 26.6–33.0)
MCHC: 31.5 g/dL (ref 31.5–35.7)
MCV: 86 fL (ref 79–97)
Monocytes Absolute: 0.5 10*3/uL (ref 0.1–0.9)
Monocytes: 6 %
Neutrophils Absolute: 5.1 10*3/uL (ref 1.4–7.0)
Neutrophils: 63 %
Platelets: 328 10*3/uL (ref 150–450)
RBC: 4.61 x10E6/uL (ref 3.77–5.28)
RDW: 13.2 % (ref 11.7–15.4)
WBC: 8 10*3/uL (ref 3.4–10.8)

## 2023-01-13 LAB — IRON,TIBC AND FERRITIN PANEL
Ferritin: 79 ng/mL (ref 15–150)
Iron Saturation: 19 % (ref 15–55)
Iron: 81 ug/dL (ref 27–159)
Total Iron Binding Capacity: 422 ug/dL (ref 250–450)
UIBC: 341 ug/dL (ref 131–425)

## 2023-01-13 LAB — HEMOGLOBIN A1C
Est. average glucose Bld gHb Est-mCnc: 131 mg/dL
Hgb A1c MFr Bld: 6.2 % — ABNORMAL HIGH (ref 4.8–5.6)

## 2023-01-14 ENCOUNTER — Ambulatory Visit: Payer: Commercial Managed Care - PPO | Admitting: Nurse Practitioner

## 2023-01-14 VITALS — BP 111/76 | Ht 65.0 in | Wt 171.2 lb

## 2023-01-14 DIAGNOSIS — E782 Mixed hyperlipidemia: Secondary | ICD-10-CM

## 2023-01-14 DIAGNOSIS — Z7985 Long-term (current) use of injectable non-insulin antidiabetic drugs: Secondary | ICD-10-CM | POA: Diagnosis not present

## 2023-01-14 DIAGNOSIS — K760 Fatty (change of) liver, not elsewhere classified: Secondary | ICD-10-CM | POA: Diagnosis not present

## 2023-01-14 DIAGNOSIS — E538 Deficiency of other specified B group vitamins: Secondary | ICD-10-CM

## 2023-01-14 DIAGNOSIS — F419 Anxiety disorder, unspecified: Secondary | ICD-10-CM | POA: Diagnosis not present

## 2023-01-14 DIAGNOSIS — E1140 Type 2 diabetes mellitus with diabetic neuropathy, unspecified: Secondary | ICD-10-CM

## 2023-01-14 NOTE — Progress Notes (Unsigned)
Subjective:    Patient ID: [name], female    DOB: 1970/05/28, 52 y.o.   MRN: [id number]  HPI Presents for routine follow-up on her chronic health issues.  Remains under tremendous stress due to a family situation.  Has seen neurology for Heema plegic migraines.  Was also evaluated for difficulty focusing and finding words at times.  Her neurologist has told her that the symptoms are most likely related to stress and anxiety.  Continues to take alprazolam 1 mg twice daily which is helping.  Amitriptyline was increased to 100 mg per neurology.  Also on gabapentin and lidocaine/prilocaine cream topical for her neuropathy.  Adherent to medications, see updated list.  Denies any side effects from the [facility].  Tries to eat healthy.  Activity has been limited due to extreme fatigue secondary to stress.  Review of Systems  Constitutional:  Positive for fatigue.  HENT:  Negative for sore throat and trouble swallowing.   Respiratory:  Positive for chest tightness. Negative for cough, shortness of breath and wheezing.        Occasional chest tightness only with extreme anxiety.  No issues with activity.  Cardiovascular:  Negative for chest pain.      01/14/2023    1:23 PM  Depression screen PHQ 2/9  Decreased Interest 0  Down, Depressed, Hopeless 0  PHQ - 2 Score 0       Objective:   Physical Exam NAD.  Alert, oriented.  Very fatigued in appearance.  Making good eye contact.  Calm affect.  Speech clear.  Dressed appropriately for the weather.  Thoughts logical coherent and relevant.  Normal judgment and mood.  Thyroid nontender to palpation, no mass or goiter noted.  Lungs clear.  Heart regular rate rhythm.  No murmur noted.  Abdomen soft nondistended nontender without obvious masses Today's Vitals   01/14/23 1317  BP: 111/76  Weight: 171 lb 3.2 oz (77.7 kg)  Height: 5\' 5"  (1.651 m)   Body mass index is 28.49 kg/m.   Results for orders placed or performed in visit on 12/30/22   CBC with Differential/Platelet  Result Value Ref Range   WBC 8.0 3.4 - 10.8 x10E3/uL   RBC 4.61 3.77 - 5.28 x10E6/uL   Hemoglobin 12.4 11.1 - 15.9 g/dL   Hematocrit 86.5 78.4 - 46.6 %   MCV 86 79 - 97 fL   MCH 26.9 26.6 - 33.0 pg   MCHC 31.5 31.5 - 35.7 g/dL   RDW 69.6 29.5 - 28.4 %   Platelets 328 150 - 450 x10E3/uL   Neutrophils 63 Not Estab. %   Lymphs 28 Not Estab. %   Monocytes 6 Not Estab. %   Eos 2 Not Estab. %   Basos 1 Not Estab. %   Neutrophils Absolute 5.1 1.4 - 7.0 x10E3/uL   Lymphocytes Absolute 2.2 0.7 - 3.1 x10E3/uL   Monocytes Absolute 0.5 0.1 - 0.9 x10E3/uL   EOS (ABSOLUTE) 0.1 0.0 - 0.4 x10E3/uL   Basophils Absolute 0.0 0.0 - 0.2 x10E3/uL   Immature Granulocytes 0 Not Estab. %   Immature Grans (Abs) 0.0 0.0 - 0.1 x10E3/uL  Hemoglobin A1c  Result Value Ref Range   Hgb A1c MFr Bld 6.2 (H) 4.8 - 5.6 %   Est. average glucose Bld gHb Est-mCnc 131 mg/dL  Lipid panel  Result Value Ref Range   Cholesterol, Total 121 100 - 199 mg/dL   Triglycerides 132 0 - 149 mg/dL   HDL 35 (L) >44 mg/dL  VLDL Cholesterol Cal 23 5 - 40 mg/dL   LDL Chol Calc (NIH) 63 0 - 99 mg/dL   Chol/HDL Ratio 3.5 0.0 - 4.4 ratio  Iron, TIBC and Ferritin Panel  Result Value Ref Range   Total Iron Binding Capacity 422 250 - 450 ug/dL   UIBC 010 272 - 536 ug/dL   Iron 81 27 - 644 ug/dL   Iron Saturation 19 15 - 55 %   Ferritin 79 15 - 150 ng/mL   *Note: Due to a large number of results and/or encounters for the requested time period, some results have not been displayed. A complete set of results can be found in Results Review.   Reviewed labs with patient during visit.     Assessment & Plan:   Problem List Items Addressed This Visit       Digestive   NAFL (nonalcoholic fatty liver)     Endocrine   Type 2 diabetes mellitus (HCC) - Primary     Other   Anxiety   B12 deficiency   Mixed hyperlipidemia   Meds ordered this encounter  Medications   ALPRAZolam (XANAX) 1 MG tablet     Sig: Take 1 tablet (1 mg total) by mouth 2 (two) times daily as needed. for anxiety    Dispense:  60 tablet    Refill:  2    May fill 30 days from 12/23/22. May refill monthly.    Order Specific Question:   Supervising Provider    Answer:   Lilyan Punt A [[id number]]   Continue current medications as directed.  Contact pharmacy when refills are needed.  Lab work continues to improve.  Encouraged healthy lifestyle including healthy diet, regular exercise, adequate rest and stress reduction.  Discussed the importance of resolving her family issues as soon as possible due to the extreme stress.   Return in about 3 months (around 04/16/2023).

## 2023-01-15 ENCOUNTER — Encounter: Payer: Self-pay | Admitting: Nurse Practitioner

## 2023-01-15 MED ORDER — ALPRAZOLAM 1 MG PO TABS: 1.0000 mg | ORAL_TABLET | Freq: Two times a day (BID) | ORAL | 2 refills | Status: DC | PRN

## 2023-01-17 ENCOUNTER — Other Ambulatory Visit: Payer: Self-pay

## 2023-01-18 ENCOUNTER — Other Ambulatory Visit (HOSPITAL_COMMUNITY): Payer: Self-pay

## 2023-01-21 ENCOUNTER — Other Ambulatory Visit: Payer: Self-pay | Admitting: Neurology

## 2023-01-27 DIAGNOSIS — H5213 Myopia, bilateral: Secondary | ICD-10-CM | POA: Diagnosis not present

## 2023-01-30 ENCOUNTER — Other Ambulatory Visit (HOSPITAL_COMMUNITY): Payer: Self-pay

## 2023-02-01 ENCOUNTER — Other Ambulatory Visit: Payer: Self-pay

## 2023-02-01 ENCOUNTER — Other Ambulatory Visit (HOSPITAL_COMMUNITY): Payer: Self-pay

## 2023-02-01 MED ORDER — MIEBO 1.338 GM/ML OP SOLN
1.0000 [drp] | Freq: Four times a day (QID) | OPHTHALMIC | 99 refills | Status: AC
  Filled 2023-02-01: qty 3, 8d supply, fill #0
  Filled 2023-02-20: qty 3, 8d supply, fill #1
  Filled 2023-02-26: qty 3, 8d supply, fill #2
  Filled 2023-03-13: qty 3, 8d supply, fill #3
  Filled 2023-03-20: qty 3, 8d supply, fill #4
  Filled 2023-03-30: qty 3, 8d supply, fill #5
  Filled 2023-04-09: qty 3, 8d supply, fill #6
  Filled 2023-05-04: qty 3, 8d supply, fill #7
  Filled 2023-05-17: qty 3, 8d supply, fill #8
  Filled 2023-06-06: qty 3, 8d supply, fill #9
  Filled 2023-06-20: qty 3, 8d supply, fill #10
  Filled 2023-06-27: qty 3, 8d supply, fill #11
  Filled 2023-07-07: qty 3, 8d supply, fill #12
  Filled 2023-07-18: qty 3, 8d supply, fill #13
  Filled 2023-07-26: qty 3, 8d supply, fill #14
  Filled 2023-08-15: qty 3, 8d supply, fill #15
  Filled 2023-09-04: qty 3, 8d supply, fill #16
  Filled 2023-09-25: qty 3, 8d supply, fill #17
  Filled 2023-10-04: qty 3, 30d supply, fill #18
  Filled 2023-10-12 – 2023-10-25 (×5): qty 3, 8d supply, fill #18
  Filled 2023-10-28: qty 3, 24d supply, fill #18

## 2023-02-06 ENCOUNTER — Other Ambulatory Visit: Payer: Self-pay | Admitting: Family Medicine

## 2023-02-06 ENCOUNTER — Other Ambulatory Visit: Payer: Self-pay | Admitting: Neurology

## 2023-02-07 ENCOUNTER — Other Ambulatory Visit: Payer: Self-pay

## 2023-02-07 ENCOUNTER — Encounter: Payer: Self-pay | Admitting: Neurology

## 2023-02-07 ENCOUNTER — Other Ambulatory Visit (HOSPITAL_COMMUNITY): Payer: Self-pay

## 2023-02-07 MED ORDER — IRON (FERROUS SULFATE) 325 (65 FE) MG PO TABS
325.0000 mg | ORAL_TABLET | Freq: Every day | ORAL | 1 refills | Status: DC
  Filled 2023-02-07: qty 90, 90d supply, fill #0
  Filled 2023-03-13 – 2023-05-04 (×2): qty 90, 90d supply, fill #1

## 2023-02-08 ENCOUNTER — Other Ambulatory Visit: Payer: Self-pay

## 2023-02-08 ENCOUNTER — Other Ambulatory Visit (HOSPITAL_COMMUNITY): Payer: Self-pay

## 2023-02-08 MED ORDER — GABAPENTIN 800 MG PO TABS
800.0000 mg | ORAL_TABLET | Freq: Three times a day (TID) | ORAL | 1 refills | Status: DC
  Filled 2023-02-08: qty 270, 90d supply, fill #0
  Filled 2023-04-22 – 2023-05-04 (×2): qty 270, 90d supply, fill #1

## 2023-02-14 ENCOUNTER — Other Ambulatory Visit: Payer: Self-pay

## 2023-02-14 ENCOUNTER — Other Ambulatory Visit (HOSPITAL_COMMUNITY): Payer: Self-pay

## 2023-02-18 ENCOUNTER — Ambulatory Visit: Payer: Commercial Managed Care - PPO | Admitting: Nurse Practitioner

## 2023-02-20 ENCOUNTER — Other Ambulatory Visit: Payer: Self-pay | Admitting: Nurse Practitioner

## 2023-02-20 ENCOUNTER — Other Ambulatory Visit (HOSPITAL_COMMUNITY): Payer: Self-pay

## 2023-02-20 DIAGNOSIS — K219 Gastro-esophageal reflux disease without esophagitis: Secondary | ICD-10-CM

## 2023-02-21 ENCOUNTER — Other Ambulatory Visit: Payer: Self-pay

## 2023-02-21 ENCOUNTER — Other Ambulatory Visit (HOSPITAL_COMMUNITY): Payer: Self-pay

## 2023-02-21 MED ORDER — FAMOTIDINE 40 MG PO TABS
40.0000 mg | ORAL_TABLET | Freq: Every day | ORAL | 1 refills | Status: DC
Start: 2023-02-21 — End: 2023-11-02
  Filled 2023-02-21: qty 90, 90d supply, fill #0
  Filled 2023-05-04: qty 90, 90d supply, fill #1

## 2023-02-21 MED ORDER — PANTOPRAZOLE SODIUM 40 MG PO TBEC
40.0000 mg | DELAYED_RELEASE_TABLET | Freq: Two times a day (BID) | ORAL | 1 refills | Status: DC
  Filled 2023-02-21: qty 180, 90d supply, fill #0
  Filled 2023-05-04: qty 180, 90d supply, fill #1

## 2023-02-22 ENCOUNTER — Other Ambulatory Visit: Payer: Self-pay

## 2023-02-22 ENCOUNTER — Encounter (HOSPITAL_COMMUNITY): Payer: Self-pay

## 2023-02-22 ENCOUNTER — Other Ambulatory Visit (HOSPITAL_COMMUNITY): Payer: Self-pay

## 2023-02-27 ENCOUNTER — Other Ambulatory Visit (HOSPITAL_COMMUNITY): Payer: Self-pay

## 2023-03-01 ENCOUNTER — Other Ambulatory Visit (HOSPITAL_COMMUNITY): Payer: Self-pay

## 2023-03-13 ENCOUNTER — Other Ambulatory Visit: Payer: Self-pay | Admitting: Nurse Practitioner

## 2023-03-13 ENCOUNTER — Other Ambulatory Visit: Payer: Self-pay | Admitting: Allergy & Immunology

## 2023-03-13 ENCOUNTER — Other Ambulatory Visit: Payer: Self-pay | Admitting: Family Medicine

## 2023-03-13 DIAGNOSIS — G629 Polyneuropathy, unspecified: Secondary | ICD-10-CM

## 2023-03-13 DIAGNOSIS — E782 Mixed hyperlipidemia: Secondary | ICD-10-CM

## 2023-03-14 ENCOUNTER — Other Ambulatory Visit: Payer: Self-pay

## 2023-03-14 DIAGNOSIS — E782 Mixed hyperlipidemia: Secondary | ICD-10-CM

## 2023-03-14 MED ORDER — CYCLOBENZAPRINE HCL 10 MG PO TABS
10.0000 mg | ORAL_TABLET | Freq: Every day | ORAL | 0 refills | Status: DC | PRN
  Filled 2023-03-14: qty 90, 90d supply, fill #0

## 2023-03-14 MED ORDER — ROSUVASTATIN CALCIUM 10 MG PO TABS
10.0000 mg | ORAL_TABLET | Freq: Every day | ORAL | 1 refills | Status: DC
  Filled 2023-03-14: qty 90, 90d supply, fill #0
  Filled 2023-06-06: qty 90, 90d supply, fill #1

## 2023-03-16 ENCOUNTER — Other Ambulatory Visit: Payer: Self-pay

## 2023-03-16 ENCOUNTER — Encounter (HOSPITAL_COMMUNITY): Payer: Self-pay | Admitting: Emergency Medicine

## 2023-03-16 ENCOUNTER — Emergency Department (HOSPITAL_COMMUNITY): Payer: Commercial Managed Care - PPO

## 2023-03-16 ENCOUNTER — Telehealth: Payer: Self-pay | Admitting: Neurology

## 2023-03-16 ENCOUNTER — Ambulatory Visit: Payer: Commercial Managed Care - PPO | Admitting: Family Medicine

## 2023-03-16 ENCOUNTER — Emergency Department (HOSPITAL_COMMUNITY)
Admission: EM | Admit: 2023-03-16 | Discharge: 2023-03-16 | Disposition: A | Discharge status: Home / Self Care | Payer: Commercial Managed Care - PPO | Attending: Emergency Medicine | Admitting: Emergency Medicine

## 2023-03-16 DIAGNOSIS — W19XXXA Unspecified fall, initial encounter: Secondary | ICD-10-CM | POA: Diagnosis not present

## 2023-03-16 DIAGNOSIS — M549 Dorsalgia, unspecified: Secondary | ICD-10-CM | POA: Diagnosis not present

## 2023-03-16 DIAGNOSIS — R0989 Other specified symptoms and signs involving the circulatory and respiratory systems: Secondary | ICD-10-CM | POA: Diagnosis not present

## 2023-03-16 DIAGNOSIS — M545 Low back pain, unspecified: Secondary | ICD-10-CM | POA: Diagnosis not present

## 2023-03-16 DIAGNOSIS — Z20822 Contact with and (suspected) exposure to covid-19: Secondary | ICD-10-CM | POA: Diagnosis not present

## 2023-03-16 DIAGNOSIS — R55 Syncope and collapse: Secondary | ICD-10-CM | POA: Diagnosis present

## 2023-03-16 DIAGNOSIS — Z79899 Other long term (current) drug therapy: Secondary | ICD-10-CM | POA: Insufficient documentation

## 2023-03-16 DIAGNOSIS — E119 Type 2 diabetes mellitus without complications: Secondary | ICD-10-CM | POA: Insufficient documentation

## 2023-03-16 DIAGNOSIS — M25521 Pain in right elbow: Secondary | ICD-10-CM | POA: Insufficient documentation

## 2023-03-16 DIAGNOSIS — R519 Headache, unspecified: Secondary | ICD-10-CM | POA: Diagnosis not present

## 2023-03-16 DIAGNOSIS — Z7984 Long term (current) use of oral hypoglycemic drugs: Secondary | ICD-10-CM | POA: Diagnosis not present

## 2023-03-16 DIAGNOSIS — Z043 Encounter for examination and observation following other accident: Secondary | ICD-10-CM | POA: Diagnosis not present

## 2023-03-16 DIAGNOSIS — I10 Essential (primary) hypertension: Secondary | ICD-10-CM | POA: Diagnosis not present

## 2023-03-16 DIAGNOSIS — M160 Bilateral primary osteoarthritis of hip: Secondary | ICD-10-CM | POA: Diagnosis not present

## 2023-03-16 DIAGNOSIS — Z794 Long term (current) use of insulin: Secondary | ICD-10-CM | POA: Insufficient documentation

## 2023-03-16 LAB — COMPREHENSIVE METABOLIC PANEL
ALT: 33 U/L (ref 0–44)
AST: 49 U/L — ABNORMAL HIGH (ref 15–41)
Albumin: 4.4 g/dL (ref 3.5–5.0)
Alkaline Phosphatase: 57 U/L (ref 38–126)
Anion gap: 11 (ref 5–15)
BUN: 30 mg/dL — ABNORMAL HIGH (ref 6–20)
CO2: 29 mmol/L (ref 22–32)
Calcium: 9.4 mg/dL (ref 8.9–10.3)
Chloride: 97 mmol/L — ABNORMAL LOW (ref 98–111)
Creatinine, Ser: 1.58 mg/dL — ABNORMAL HIGH (ref 0.44–1.00)
GFR, Estimated: 39 mL/min — ABNORMAL LOW (ref >60)
Glucose, Bld: 112 mg/dL — ABNORMAL HIGH (ref 70–99)
Potassium: 4.1 mmol/L (ref 3.5–5.1)
Sodium: 137 mmol/L (ref 135–145)
Total Bilirubin: 0.5 mg/dL (ref 0.0–1.2)
Total Protein: 7.2 g/dL (ref 6.5–8.1)

## 2023-03-16 LAB — CBC WITH DIFFERENTIAL/PLATELET
Abs Immature Granulocytes: 0.02 10*3/uL (ref 0.00–0.07)
Basophils Absolute: 0 10*3/uL (ref 0.0–0.1)
Basophils Relative: 1 %
Eosinophils Absolute: 0.1 10*3/uL (ref 0.0–0.5)
Eosinophils Relative: 2 %
HCT: 39.8 % (ref 36.0–46.0)
Hemoglobin: 12.9 g/dL (ref 12.0–15.0)
Immature Granulocytes: 0 %
Lymphocytes Relative: 30 %
Lymphs Abs: 2.5 10*3/uL (ref 0.7–4.0)
MCH: 28.2 pg (ref 26.0–34.0)
MCHC: 32.4 g/dL (ref 30.0–36.0)
MCV: 86.9 fL (ref 80.0–100.0)
Monocytes Absolute: 0.5 10*3/uL (ref 0.1–1.0)
Monocytes Relative: 6 %
Neutro Abs: 4.9 10*3/uL (ref 1.7–7.7)
Neutrophils Relative %: 61 %
Platelets: 322 10*3/uL (ref 150–400)
RBC: 4.58 MIL/uL (ref 3.87–5.11)
RDW: 13.8 % (ref 11.5–15.5)
WBC: 8.1 10*3/uL (ref 4.0–10.5)
nRBC: 0 % (ref 0.0–0.2)

## 2023-03-16 LAB — BASIC METABOLIC PANEL
Anion gap: 11 (ref 5–15)
BUN: 26 mg/dL — ABNORMAL HIGH (ref 6–20)
CO2: 27 mmol/L (ref 22–32)
Calcium: 8.7 mg/dL — ABNORMAL LOW (ref 8.9–10.3)
Chloride: 98 mmol/L (ref 98–111)
Creatinine, Ser: 1.1 mg/dL — ABNORMAL HIGH (ref 0.44–1.00)
GFR, Estimated: >60 mL/min (ref >60)
Glucose, Bld: 103 mg/dL — ABNORMAL HIGH (ref 70–99)
Potassium: 3.8 mmol/L (ref 3.5–5.1)
Sodium: 136 mmol/L (ref 135–145)

## 2023-03-16 LAB — URINALYSIS, ROUTINE W REFLEX MICROSCOPIC
Bilirubin Urine: NEGATIVE
Glucose, UA: NEGATIVE mg/dL
Hgb urine dipstick: NEGATIVE
Ketones, ur: NEGATIVE mg/dL
Leukocytes,Ua: NEGATIVE
Nitrite: NEGATIVE
Protein, ur: NEGATIVE mg/dL
Specific Gravity, Urine: 1.012 (ref 1.005–1.030)
pH: 5 (ref 5.0–8.0)

## 2023-03-16 LAB — CK: Total CK: 460 U/L — ABNORMAL HIGH (ref 38–234)

## 2023-03-16 LAB — RESP PANEL BY RT-PCR (RSV, FLU A&B, COVID)  RVPGX2
Influenza A by PCR: NEGATIVE
Influenza B by PCR: NEGATIVE
Resp Syncytial Virus by PCR: NEGATIVE
SARS Coronavirus 2 by RT PCR: NEGATIVE

## 2023-03-16 LAB — TROPONIN I (HIGH SENSITIVITY)
Troponin I (High Sensitivity): 3 ng/L (ref <18)
Troponin I (High Sensitivity): 2 ng/L (ref <18)

## 2023-03-16 LAB — MAGNESIUM: Magnesium: 2.9 mg/dL — ABNORMAL HIGH (ref 1.7–2.4)

## 2023-03-16 LAB — CBG MONITORING, ED: Glucose-Capillary: 108 mg/dL — ABNORMAL HIGH (ref 70–99)

## 2023-03-16 MED ORDER — PROCHLORPERAZINE EDISYLATE 10 MG/2ML IJ SOLN
10.0000 mg | Freq: Once | INTRAMUSCULAR | Status: AC
Start: 2023-03-16 — End: 2023-03-16
  Administered 2023-03-16: 10 mg via INTRAVENOUS
  Filled 2023-03-16: qty 2

## 2023-03-16 MED ORDER — OXYCODONE-ACETAMINOPHEN 5-325 MG PO TABS
1.0000 | ORAL_TABLET | Freq: Once | ORAL | Status: AC
  Administered 2023-03-16: 1 via ORAL
  Filled 2023-03-16: qty 1

## 2023-03-16 MED ORDER — LACTATED RINGERS IV BOLUS
1000.0000 mL | Freq: Once | INTRAVENOUS | Status: AC
  Administered 2023-03-16: 1000 mL via INTRAVENOUS

## 2023-03-16 MED ORDER — DIPHENHYDRAMINE HCL 50 MG/ML IJ SOLN
12.5000 mg | Freq: Once | INTRAMUSCULAR | Status: AC
Start: 2023-03-16 — End: 2023-03-16
  Administered 2023-03-16: 12.5 mg via INTRAVENOUS
  Filled 2023-03-16: qty 1

## 2023-03-16 MED ORDER — GADOBUTROL 1 MMOL/ML IV SOLN
7.0000 mL | Freq: Once | INTRAVENOUS | Status: AC | PRN
  Administered 2023-03-16: 7 mL via INTRAVENOUS

## 2023-03-16 MED ORDER — KETOROLAC TROMETHAMINE 15 MG/ML IJ SOLN
15.0000 mg | Freq: Once | INTRAMUSCULAR | Status: AC
Start: 2023-03-16 — End: 2023-03-16
  Administered 2023-03-16: 15 mg via INTRAVENOUS
  Filled 2023-03-16: qty 1

## 2023-03-16 NOTE — ED Provider Notes (Signed)
EMERGENCY DEPARTMENT AT [facility] Provider Note   CSN: [id number] Arrival date & time: 03/16/23  1006     History  Chief Complaint  Patient presents with   Fall   Headache    [name] is a 53 y.o. female.  HPI Patient presents for headache and syncope.  Medical history includes DM, migraine headaches, IBS, HTN, anxiety, gout.  She has had headache ongoing for the past 4 days, intermittently.  She took her Ubrelvy 2 x 4 days ago.  This morning, she had a syncopal episode.  This was witnessed by her husband and he states that she did not try to catch herself.  Patient does not remember the fall.  She did strike her head and landed on her right side.  Currently, she has ongoing headache as well as some right-sided back, elbow, and buttock pain.    Home Medications Prior to Admission medications   Medication Sig Start Date End Date Taking? Authorizing Provider  allopurinol (ZYLOPRIM) 300 MG tablet Take 1 tablet (300 mg total) by mouth 2 (two) times daily. 06/04/22   Campbell Riches, NP  ALPRAZolam Prudy Feeler) 1 MG tablet Take 1 tablet (1 mg total) by mouth 2 (two) times daily as needed. for anxiety 01/15/23   Campbell Riches, NP  amitriptyline (ELAVIL) 100 MG tablet Take 1 tablet (100 mg total) by mouth at bedtime. 11/24/22   Drema Dallas, DO  cyanocobalamin (VITAMIN B12) 1000 MCG/ML injection Inject 1 mL into the muscle every 30 days. 04/20/22   Babs Sciara, MD  cyclobenzaprine (FLEXERIL) 10 MG tablet Take 1 tablet (10 mg total) by mouth daily as needed for muscle spasms. 03/14/23   Campbell Riches, NP  EPINEPHrine 0.3 mg/0.3 mL IJ SOAJ injection Inject 0.3 mg into the muscle as needed for anaphylaxis. 09/08/22   Alfonse Spruce, MD  famotidine (PEPCID) 40 MG tablet Take 1 tablet (40 mg total) by mouth at bedtime. 02/21/23   Campbell Riches, NP  gabapentin (NEURONTIN) 800 MG tablet Take 1 tablet (800 mg total) by mouth 3 (three) times daily.  02/08/23   Everlena Cooper, Adam R, DO  Galcanezumab-gnlm (EMGALITY) 120 MG/ML SOAJ Inject 120 mg into the skin every 28 (twenty-eight) days. 01/05/23   Drema Dallas, DO  glucose blood (FREESTYLE LITE) test strip Use twice daily as directed to check blood sugar 11/13/21   Babs Sciara, MD  Iron, Ferrous Sulfate, 325 (65 Fe) MG TABS Take one tablet (325 mg) by mouth daily. 02/07/23   Babs Sciara, MD  levocetirizine (XYZAL) 5 MG tablet Take 1 tablet (5 mg total) by mouth daily as needed for allergies (Can take an etxra dose during flare ups). 09/08/22   Alfonse Spruce, MD  lidocaine-prilocaine (EMLA) cream Apply to feet as needed for nerve pain Patient not taking: Reported on 12/17/2022 11/24/22   Campbell Riches, NP  magic mouthwash (nystatin, hydrocortisone, diphenhydrAMINE) suspension Swish and spit 5 MLs by mouth  3 times a day as needed 12/31/22   Campbell Riches, NP  metFORMIN (GLUCOPHAGE) 1000 MG tablet Take 1 tablet (1,000 mg total) by mouth 2 (two) times daily before a meal. 01/07/23   Campbell Riches, NP  montelukast (SINGULAIR) 10 MG tablet Take 1 tablet (10 mg total) by mouth daily. 09/08/22   Alfonse Spruce, MD  omega-3 acid ethyl esters (LOVAZA) 1 g capsule Take 2 capsules (2 g total) by mouth 2 (two) times  daily. 11/24/22   Babs Sciara, MD  pantoprazole (PROTONIX) 40 MG tablet Take 1 tablet (40 mg total) by mouth 2 (two) times daily before a meal. 02/21/23   Campbell Riches, NP  Perfluorohexyloctane (MIEBO) 1.338 GM/ML SOLN Place 1 drop in both eyes up to 4 times daily as directed. Patient not taking: Reported on 12/17/2022 01/08/22     Perfluorohexyloctane (MIEBO) 1.338 GM/ML SOLN Place 1 drop into both eyes up to 4 (four) times daily as directed. 01/30/23     rosuvastatin (CRESTOR) 10 MG tablet Take 1 tablet (10 mg total) by mouth at bedtime. 03/14/23   Babs Sciara, MD  tirzepatide [facility]) 15 MG/0.5ML Pen Inject 15 mg into the skin once a week. 11/24/22    Luking, Jonna Coup, MD  Ubrogepant (UBRELVY) 100 MG TABS Take 1 tablet (100 mg total) by mouth as needed. May repeat after 2 hours.  Maximum 2 tablets in 24 hours. 03/22/22   Drema Dallas, DO  Varenicline Tartrate (TYRVAYA) 0.03 MG/ACT SOLN Use 2 times a day as directed Patient not taking: Reported on 12/17/2022 03/22/22     Varenicline Tartrate (TYRVAYA) 0.03 MG/ACT SOLN Use twice daily as directed. Patient not taking: Reported on 12/17/2022 05/26/22     cetirizine (ZYRTEC) 10 MG tablet Take 1 tablet (10 mg total) by mouth daily. 09/24/19 03/03/20  Junie Spencer, FNP      Allergies    Omnicef [cefdinir] and Losartan    Review of Systems   Review of Systems  Eyes:  Positive for photophobia.  Musculoskeletal:  Positive for arthralgias and back pain.  Neurological:  Positive for syncope and headaches.  All other systems reviewed and are negative.   Physical Exam Updated Vital Signs BP 105/73   Pulse (!) 105   Temp (!) 97.5 F (36.4 C) (Oral)   Resp (!) 21   SpO2 100%  Physical Exam Vitals and nursing note reviewed.  Constitutional:      General: She is not in acute distress.    Appearance: She is well-developed. She is not ill-appearing, toxic-appearing or diaphoretic.  HENT:     Head: Normocephalic and atraumatic.  Eyes:     Extraocular Movements: Extraocular movements intact.     Conjunctiva/sclera: Conjunctivae normal.  Cardiovascular:     Rate and Rhythm: Normal rate and regular rhythm.  Pulmonary:     Effort: Pulmonary effort is normal. No respiratory distress.  Abdominal:     General: There is no distension.     Palpations: Abdomen is soft.     Tenderness: There is no abdominal tenderness.  Musculoskeletal:        General: No swelling.     Cervical back: Neck supple.  Skin:    General: Skin is warm and dry.     Capillary Refill: Capillary refill takes less than 2 seconds.     Coloration: Skin is not cyanotic or pale.  Neurological:     Mental Status: She is alert  and oriented to person, place, and time.     Cranial Nerves: No cranial nerve deficit, dysarthria or facial asymmetry.     Sensory: No sensory deficit.     Motor: No weakness.     Coordination: Coordination normal.  Psychiatric:        Mood and Affect: Mood normal.        Speech: Speech normal.        Behavior: Behavior normal.     ED Results / Procedures / Treatments  Labs (all labs ordered are listed, but only abnormal results are displayed) Labs Reviewed  COMPREHENSIVE METABOLIC PANEL - Abnormal; Notable for the following components:      Result Value   Chloride 97 (*)    Glucose, Bld 112 (*)    BUN 30 (*)    Creatinine, Ser 1.58 (*)    AST 49 (*)    GFR, Estimated 39 (*)    All other components within normal limits  MAGNESIUM - Abnormal; Notable for the following components:   Magnesium 2.9 (*)    All other components within normal limits  CBG MONITORING, ED - Abnormal; Notable for the following components:   Glucose-Capillary 108 (*)    All other components within normal limits  RESP PANEL BY RT-PCR (RSV, FLU A&B, COVID)  RVPGX2  CBC WITH DIFFERENTIAL/PLATELET  URINALYSIS, ROUTINE W REFLEX MICROSCOPIC  TROPONIN I (HIGH SENSITIVITY)  TROPONIN I (HIGH SENSITIVITY)    EKG EKG Interpretation Date/Time:  Wednesday March 16 2023 10:36:17 EST Ventricular Rate:  108 PR Interval:  166 QRS Duration:  86 QT Interval:  347 QTC Calculation: 466 R Axis:   62  Text Interpretation: Sinus tachycardia Low voltage, precordial leads Confirmed by Gloris Manchester ([id number]) on 03/16/2023 10:41:18 AM  Radiology MR Brain W and Wo Contrast Result Date: 03/16/2023 CLINICAL DATA:  Provided history: Headache, neuro deficit. EXAM: MRI HEAD WITHOUT AND WITH CONTRAST TECHNIQUE: Multiplanar, multiecho pulse sequences of the brain and surrounding structures were obtained without and with intravenous contrast. CONTRAST:  7mL GADAVIST GADOBUTROL 1 MMOL/ML IV SOLN COMPARISON:  Head CT 11/17/2021.   Brain MRI 11/16/2021. FINDINGS: Brain: No age-advanced or lobar predominant parenchymal atrophy. No cortical encephalomalacia is identified. No significant cerebral white matter disease for age. There is no acute infarct. No evidence of an intracranial mass. No chronic intracranial blood products. No extra-axial fluid collection. No midline shift. No pathologic intracranial enhancement identified. Vascular: Maintained flow voids within the proximal large arterial vessels. Skull and upper cervical spine: No focal worrisome marrow lesion. Sinuses/Orbits: No mass or acute finding within the imaged orbits. No significant paranasal sinus disease. IMPRESSION: Unremarkable MRI appearance of the brain for age. No evidence of an acute intracranial abnormality. Electronically Signed   By: Jackey Loge D.O.   On: 03/16/2023 12:51   DG Chest Port 1 View Result Date: 03/16/2023 CLINICAL DATA:  Fall. EXAM: PORTABLE CHEST 1 VIEW COMPARISON:  02/20/2019. FINDINGS: Low lung volume. Bilateral lung fields are clear. Bilateral costophrenic angles are clear. Normal cardio-mediastinal silhouette. No acute osseous abnormalities. The soft tissues are within normal limits. There are surgical clips in the right upper quadrant, typical of a previous cholecystectomy. IMPRESSION: No active disease. Electronically Signed   By: Jules Schick M.D.   On: 03/16/2023 11:28   DG Hip Unilat W or Wo Pelvis 2-3 Views Right Result Date: 03/16/2023 CLINICAL DATA:  Fall. EXAM: DG HIP (WITH OR WITHOUT PELVIS) 2-3V RIGHT COMPARISON:  None Available. FINDINGS: Pelvis is intact with normal and symmetric sacroiliac joints. No acute fracture or dislocation. No aggressive osseous lesion. Visualized sacral arcuate lines are unremarkable. There are changes of chronic pubic symphisitis. There are mild degenerative changes of bilateral hip joints without significant joint space narrowing. Osteophytosis of the superior acetabulum. No radiopaque foreign bodies.  IMPRESSION: *No acute displaced fracture of the pelvis. Electronically Signed   By: Jules Schick M.D.   On: 03/16/2023 11:28   CT HEAD WO CONTRAST Result Date: 03/16/2023 CLINICAL DATA:  Syncope/presyncope EXAM: CT HEAD  WITHOUT CONTRAST TECHNIQUE: Contiguous axial images were obtained from the base of the skull through the vertex without intravenous contrast. RADIATION DOSE REDUCTION: This exam was performed according to the departmental dose-optimization program which includes automated exposure control, adjustment of the mA and/or kV according to patient size and/or use of iterative reconstruction technique. COMPARISON:  11/17/2021 FINDINGS: Brain: No evidence of acute infarction, hemorrhage, mass, mass effect, or midline shift. No hydrocephalus or extra-axial fluid collection. Vascular: No hyperdense vessel. Skull: Negative for fracture or focal lesion. Sinuses/Orbits: Clear paranasal sinuses. No acute finding in the orbits. Other: The mastoid air cells are well aerated. IMPRESSION: No acute intracranial process. Electronically Signed   By: Wiliam Ke M.D.   On: 03/16/2023 11:07    Procedures Procedures    Medications Ordered in ED Medications  lactated ringers bolus 1,000 mL (0 mLs Intravenous Stopped 03/16/23 1345)  prochlorperazine (COMPAZINE) injection 10 mg (10 mg Intravenous Given 03/16/23 1124)  diphenhydrAMINE (BENADRYL) injection 12.5 mg (12.5 mg Intravenous Given 03/16/23 1123)  oxyCODONE-acetaminophen (PERCOCET/ROXICET) 5-325 MG per tablet 1 tablet (1 tablet Oral Given 03/16/23 1124)  lactated ringers bolus 1,000 mL (1,000 mLs Intravenous New Bag/Given 03/16/23 1345)  gadobutrol (GADAVIST) 1 MMOL/ML injection 7 mL (7 mLs Intravenous Contrast Given 03/16/23 1235)  ketorolac (TORADOL) 15 MG/ML injection 15 mg (15 mg Intravenous Given 03/16/23 1400)    ED Course/ Medical Decision Making/ A&P                                 Medical Decision Making Amount and/or Complexity of Data  Reviewed Labs: ordered. Radiology: ordered.  Risk Prescription drug management.   This patient presents to the ED for concern of headache and syncope, this involves an extensive number of treatment options, and is a complaint that carries with it a high risk of complications and morbidity.  The differential diagnosis includes dehydration, anemia, vasovagal episode, arrhythmia, CVA, seizure, acute injuries   Co morbidities that complicate the patient evaluation  DM, migraine headaches, IBS, HTN, anxiety, gout   Additional history obtained:  Additional history obtained from patient's husband External records from outside source obtained and reviewed including EMR   Lab Tests:  I Ordered, and personally interpreted labs.  The pertinent results include: AKI is present.  Hypomagnesemia and hypochloremia are present with otherwise normal electrolytes.  Hemoglobin is normal.  No leukocytosis is present.  Troponin is normal x 2   Imaging Studies ordered:  I ordered imaging studies including x-ray of chest and right hip; CT head; MRI brain I independently visualized and interpreted imaging which showed no acute findings I agree with the radiologist interpretation   Cardiac Monitoring: / EKG:  The patient was maintained on a cardiac monitor.  I personally viewed and interpreted the cardiac monitored which showed an underlying rhythm of: Sinus rhythm  Problem List / ED Course / Critical interventions / Medication management  Patient presenting for 4 days of headache in addition to a syncopal episode that occurred earlier today.  On arrival in the ED, she is alert and oriented.  Vital signs are notable for tachycardia.  She endorses ongoing headache pain which has been intermittent since 4 days ago.  Since her fall this morning, she has some mild right-sided back, elbow, and buttock pain.  She has a small abrasion on the left side of her back.  There is minimal tenderness to the  musculature of right sided back.  Range of motion of right elbow is intact.  There is no deformity or swelling.  Headache cocktail was ordered.  Workup was initiated.  Lab work is notable for AKI.  Additional IV fluids were ordered.  Imaging studies, including MRI brain, were negative for acute findings.  On reassessment, patient's headache is improved.  She was informed of her AKI.  She does suspect dehydration as cause.  She does feel comfortable plan of p.o. hydration at home and repeat lab work within the next few days.  Urinalysis was pending at time of signout.  Care of patient was signed out to oncoming ED provider. I ordered medication including IV fluids for hydration; Compazine, Benadryl, Toradol, Percocet for headache Reevaluation of the patient after these medicines showed that the patient improved I have reviewed the patients home medicines and have made adjustments as needed   Social Determinants of Health:  Lives at home with husband        Final Clinical Impression(s) / ED Diagnoses Final diagnoses:  Bad headache  Syncope, unspecified syncope type    Rx / DC Orders ED Discharge Orders     None         Gloris Manchester, MD 03/16/23 [phone]

## 2023-03-16 NOTE — Discharge Instructions (Signed)
Follow-up with either your neurologist or your family doctor next week return if any problems

## 2023-03-16 NOTE — ED Triage Notes (Signed)
Pt ambulatory to triage.  Pt just arrived to ED triage room.  Pt c/o headache and took medication yesterday. Pt states that medication had no relief and this morning she felt dizzy when ambulating. Patient + LOC, + head strike, patient does not remember the event.  Husband reports that patient hit back of head on barstool and back. Small bruising located on left lower back .  Pt endorses pain to head, buttock, and right elbow.

## 2023-03-16 NOTE — Telephone Encounter (Signed)
Pt called in stating she has been having a headache since yesterday and has fallen twice. She thinks the falls are because of the headache. She is wondering if Dr. Everlena Cooper thinks it could still be stress related and what she should do? She has taken the [place].

## 2023-03-16 NOTE — Telephone Encounter (Signed)
If she is having unexplained falls with a new intractable headache, then she may need to be evaluated in the ED.    Per patients husband, Patient at [facility]. She is in a CT scan right now.

## 2023-03-16 NOTE — ED Notes (Signed)
Pt in CT at this time.

## 2023-03-17 ENCOUNTER — Encounter: Payer: Self-pay | Admitting: Neurology

## 2023-03-18 ENCOUNTER — Ambulatory Visit: Payer: Commercial Managed Care - PPO | Admitting: Family Medicine

## 2023-03-18 ENCOUNTER — Other Ambulatory Visit (HOSPITAL_COMMUNITY): Payer: Self-pay

## 2023-03-18 ENCOUNTER — Other Ambulatory Visit: Payer: Self-pay

## 2023-03-18 ENCOUNTER — Encounter: Payer: Self-pay | Admitting: Family Medicine

## 2023-03-18 VITALS — BP 112/79 | HR 99 | Temp 97.3°F | Ht 65.0 in | Wt 170.0 lb

## 2023-03-18 DIAGNOSIS — I951 Orthostatic hypotension: Secondary | ICD-10-CM

## 2023-03-18 DIAGNOSIS — G629 Polyneuropathy, unspecified: Secondary | ICD-10-CM

## 2023-03-18 DIAGNOSIS — R7989 Other specified abnormal findings of blood chemistry: Secondary | ICD-10-CM

## 2023-03-18 DIAGNOSIS — E1142 Type 2 diabetes mellitus with diabetic polyneuropathy: Secondary | ICD-10-CM

## 2023-03-18 DIAGNOSIS — Z7984 Long term (current) use of oral hypoglycemic drugs: Secondary | ICD-10-CM

## 2023-03-18 DIAGNOSIS — R748 Abnormal levels of other serum enzymes: Secondary | ICD-10-CM | POA: Diagnosis not present

## 2023-03-18 DIAGNOSIS — E782 Mixed hyperlipidemia: Secondary | ICD-10-CM | POA: Diagnosis not present

## 2023-03-18 MED ORDER — OMEGA-3-ACID ETHYL ESTERS 1 G PO CAPS
2.0000 | ORAL_CAPSULE | Freq: Two times a day (BID) | ORAL | 1 refills | Status: DC
  Filled 2023-03-18 – 2023-05-04 (×3): qty 360, 90d supply, fill #0
  Filled 2023-07-26: qty 360, 90d supply, fill #1

## 2023-03-18 MED ORDER — METFORMIN HCL 500 MG PO TABS
500.0000 mg | ORAL_TABLET | Freq: Two times a day (BID) | ORAL | 3 refills | Status: DC
  Filled 2023-03-18: qty 180, 90d supply, fill #0
  Filled 2023-06-06: qty 180, 90d supply, fill #1

## 2023-03-18 MED ORDER — LIDOCAINE-PRILOCAINE 2.5-2.5 % EX CREA
TOPICAL_CREAM | CUTANEOUS | 2 refills | Status: DC
  Filled 2023-03-18: qty 60, fill #0
  Filled 2023-04-09: qty 60, 30d supply, fill #0
  Filled 2023-04-22: qty 60, 60d supply, fill #0
  Filled 2023-06-01 – 2023-06-20 (×4): qty 60, 60d supply, fill #1
  Filled 2023-07-07 – 2023-08-15 (×3): qty 60, 60d supply, fill #2

## 2023-03-18 MED ORDER — MOUNJARO 15 MG/0.5ML ~~LOC~~ SOAJ
15.0000 mg | SUBCUTANEOUS | 1 refills | Status: DC
  Filled 2023-03-18 – 2023-04-22 (×2): qty 6, 84d supply, fill #0
  Filled 2023-06-27 – 2023-07-14 (×2): qty 6, 84d supply, fill #1

## 2023-03-19 NOTE — Progress Notes (Signed)
Subjective:    Patient ID: [name], female    DOB: April 09, 1970, 53 y.o.   MRN: [id number]  Discussed the use of AI scribe software for clinical note transcription with the patient, who gave verbal consent to proceed. Patient recently in the ER Had syncopal event Also is on [place] States she does not eat much throughout the day Does not drink much at all Majority of her liquid is soda Her urine tends to stay very concentrated She does describe feeling lightheaded when she stands up History of Present Illness   The patient, with a history of diabetes and migraines, presented with a recent episode of syncope at work. The event was preceded by a severe headache, described as originating from the back of the head and radiating upwards. The headache was initially managed with [name], a medication typically effective for the patient's migraines, but the relief was transient. The patient reported feeling 'swimmy' prior to the syncopal episode, but denied experiencing her usual pre-syncopal symptom of seeing spots. Upon regaining consciousness, the patient experienced dizziness and lightheadedness, which persisted even after being helped to a seated position.  The patient's migraines typically occur one to two times per month, but the frequency varies. The patient's diabetes management includes metformin and [name], with the latter being well-tolerated. The patient reported a significant weight loss of approximately 40 pounds over two years since starting [facility]. However, recent blood work revealed elevated kidney function and liver enzymes, which the patient attributed to her consumption of regular soda and inadequate hydration. The patient's urine was reported to be consistently dark yellow, indicating possible dehydration.  The patient also reported taking [name] for current life stressors and [name] for headaches and toe discomfort. The patient's blood pressure was noted to drop upon  standing, leading to increased heart rate and dizziness.         Review of Systems     Objective:    Physical Exam   MEASUREMENTS: WT- 170 CARDIOVASCULAR: Normal rate and rhythm on heart auscultation, no murmurs. Orthostatic hypotension noted with increase in heart rate upon standing. NEUROLOGICAL: Alert and oriented, no focal deficits.     Blood pressure was checked sitting and standing.  She does have a significant drop in blood pressure with standing.  Sitting her blood pressure was 110/70 standing it was 92/58      Assessment & Plan:  Assessment and Plan    Migraine Frequent migraines with associated visual disturbances and dizziness. Recent episode of syncope possibly related to dehydration and migraine. Currently on [name] for acute management. -Encourage adequate hydration, especially during migraine episodes. -Consider further evaluation if frequency or severity of migraines increases.  Type 2 Diabetes Mellitus Recent abnormal blood work. Currently on [name] and Metformin. Patient reports good tolerance of [name]. -Reduce Metformin dose to 500mg  twice daily due to elevated kidney function. -Encourage dietary modifications, including reducing sugar intake and increasing protein intake. -Repeat blood work next week to monitor kidney function and glycemic control.  Dehydration Dark urine and recent syncope suggest inadequate hydration, possibly exacerbated by [name]. -Increase fluid intake to achieve lighter urine color. -Repeat blood work next week to assess kidney function.  Anxiety Patient currently on [name], expressing ongoing life stressors. Concurrent use of [name] for migraines. -Consider discussing with [name] about potential switch to [name] to address both migraines and anxiety.  Follow-up Patient to continue with current medications and modifications as discussed. Blood work to be repeated next week. Follow-up appointment with [name] in  February.     In her situation I believe [name] is helping her diabetes but I also think that she is going significant length of time without getting calories and or liquids in.  Recommend doing a better job of spacing calories out throughout the day along with getting water into the diet to stay well-hydrated On follow-up visit I would recommend checking blood pressure sitting and standing  Patient had significant elevation of creatinine which can go along with that decreased fluid intake because her A1c is improved we will reduce her metformin to 500 mg twice a day and repeat metabolic 7 in the near future  Significant time spent with patient discussing these findings.  Discussing the causes of her syncope.  We also discussed her abnormal lab work reviewed her imaging and discussed her ER visit.  We also discussed her diabetes care and the importance of adequate fluid intake.

## 2023-03-20 ENCOUNTER — Other Ambulatory Visit: Payer: Self-pay | Admitting: Allergy & Immunology

## 2023-03-20 ENCOUNTER — Other Ambulatory Visit (HOSPITAL_COMMUNITY): Payer: Self-pay

## 2023-03-21 ENCOUNTER — Other Ambulatory Visit: Payer: Self-pay

## 2023-03-21 MED ORDER — MONTELUKAST SODIUM 10 MG PO TABS
10.0000 mg | ORAL_TABLET | Freq: Every day | ORAL | 0 refills | Status: DC
  Filled 2023-03-21: qty 30, 30d supply, fill #0

## 2023-03-22 ENCOUNTER — Encounter: Payer: Self-pay | Admitting: Family Medicine

## 2023-03-22 NOTE — Telephone Encounter (Signed)
[place] dropped off a DMV form to be completed. Placed in Dr Lorin Picket folder up front

## 2023-03-24 DIAGNOSIS — E1142 Type 2 diabetes mellitus with diabetic polyneuropathy: Secondary | ICD-10-CM | POA: Diagnosis not present

## 2023-03-25 ENCOUNTER — Encounter: Payer: Self-pay | Admitting: Family Medicine

## 2023-03-25 LAB — BASIC METABOLIC PANEL
BUN/Creatinine Ratio: 11 (ref 9–23)
BUN: 7 mg/dL (ref 6–24)
CO2: 27 mmol/L (ref 20–29)
Calcium: 9.9 mg/dL (ref 8.7–10.2)
Chloride: 98 mmol/L (ref 96–106)
Creatinine, Ser: 0.64 mg/dL (ref 0.57–1.00)
Glucose: 110 mg/dL — ABNORMAL HIGH (ref 70–99)
Potassium: 4.6 mmol/L (ref 3.5–5.2)
Sodium: 142 mmol/L (ref 134–144)
eGFR: 106 mL/min/{1.73_m2} (ref >59)

## 2023-03-25 LAB — MICROALBUMIN / CREATININE URINE RATIO
Creatinine, Urine: 20.3 mg/dL
Microalb/Creat Ratio: <15 mg/g{creat} (ref 0–29)
Microalbumin, Urine: <3 ug/mL

## 2023-03-25 NOTE — Telephone Encounter (Signed)
Nurses Please find the form and place in my box in my office Or If form can't be found then please print off new form, fill in administrative aspect and place in my in box and I will complete  Thank you-Dr Lorin Picket

## 2023-03-30 ENCOUNTER — Other Ambulatory Visit (HOSPITAL_COMMUNITY): Payer: Self-pay

## 2023-03-30 NOTE — Telephone Encounter (Signed)
 I have completed thanks In the outbox area

## 2023-03-31 ENCOUNTER — Other Ambulatory Visit: Payer: Self-pay

## 2023-03-31 ENCOUNTER — Other Ambulatory Visit (HOSPITAL_COMMUNITY): Payer: Self-pay

## 2023-03-31 DIAGNOSIS — N819 Female genital prolapse, unspecified: Secondary | ICD-10-CM | POA: Diagnosis not present

## 2023-03-31 DIAGNOSIS — N329 Bladder disorder, unspecified: Secondary | ICD-10-CM | POA: Diagnosis not present

## 2023-04-04 ENCOUNTER — Ambulatory Visit: Payer: Commercial Managed Care - PPO | Admitting: Physician Assistant

## 2023-04-06 ENCOUNTER — Telehealth: Payer: Self-pay | Admitting: Family Medicine

## 2023-04-06 NOTE — Telephone Encounter (Signed)
Patient has been checking on a surgical clearance form which was faxed over today. She states needs to be completed as soon as possible even though her surgery is not until 05/31/23. Put in red folder in your box

## 2023-04-06 NOTE — Telephone Encounter (Signed)
Please let patient know-I would recommend that the patient do her visit with [name] on the 28th and at that time if her lungs are sounding clear heart sounds good and her blood pressure is been stabilized and not having any more significant drops then she can have her surgical clearance form filled out but I would not recommend filling this form out currently until she does her follow-up with care-this form will be held at the nurses station

## 2023-04-09 ENCOUNTER — Other Ambulatory Visit (HOSPITAL_COMMUNITY): Payer: Self-pay

## 2023-04-11 ENCOUNTER — Other Ambulatory Visit: Payer: Self-pay

## 2023-04-11 ENCOUNTER — Other Ambulatory Visit (HOSPITAL_COMMUNITY): Payer: Self-pay

## 2023-04-12 ENCOUNTER — Encounter: Payer: Self-pay | Admitting: Pharmacist

## 2023-04-12 ENCOUNTER — Other Ambulatory Visit: Payer: Self-pay

## 2023-04-15 ENCOUNTER — Other Ambulatory Visit: Payer: Self-pay

## 2023-04-22 ENCOUNTER — Other Ambulatory Visit: Payer: Self-pay

## 2023-04-22 ENCOUNTER — Ambulatory Visit: Payer: Commercial Managed Care - PPO | Admitting: Nurse Practitioner

## 2023-04-22 VITALS — BP 112/66 | HR 105 | Temp 98.4°F | Ht 65.0 in | Wt 179.0 lb

## 2023-04-22 DIAGNOSIS — Z01818 Encounter for other preprocedural examination: Secondary | ICD-10-CM

## 2023-04-23 ENCOUNTER — Other Ambulatory Visit (HOSPITAL_COMMUNITY): Payer: Self-pay

## 2023-04-23 ENCOUNTER — Encounter: Payer: Self-pay | Admitting: Nurse Practitioner

## 2023-04-23 NOTE — Progress Notes (Signed)
 Subjective:    Patient ID: [name], female    DOB: 27-Oct-1970, 53 y.o.   MRN: [id number]  HPI Presents for preoperative clearance.  Husband is present with her today.  Patient was seen on 03/14/2023 at ED with a full workup due to her headaches and syncopal episode.  At that time she had an AKI most likely related to dehydration.  She is here today for recheck and for clearance.  Has increased her water intake to at least 6 glasses/day.  No further syncope.  Minimal dizziness at times.  Continues to be under's significant mental stress related to her father's estate.  Has made some changes to help decrease her stress, hopefully this will be resolved in around 2 months.  Currently taking her alprazolam about 1/day.  In addition has noticed a more distended abdomen which occurs off and on related to her bowels.  Her metformin was decreased at last visit, bowels tended to be looser before and now has noticed more constipation and gas.  Distention improves after BMs.  Review of Systems  Constitutional:  Positive for fatigue. Negative for fever.  Respiratory:  Negative for cough, chest tightness, shortness of breath and wheezing.   Cardiovascular:  Negative for chest pain, palpitations and leg swelling.  Gastrointestinal:  Positive for abdominal distention and constipation. Negative for abdominal pain and blood in stool.      04/22/2023    1:13 PM  Depression screen PHQ 2/9  Decreased Interest 0  Down, Depressed, Hopeless 0  PHQ - 2 Score 0  Tired, decreased energy 2  Change in appetite 1  Feeling bad or failure about yourself  1  Trouble concentrating 0  Moving slowly or fidgety/restless 0  Suicidal thoughts 0  Difficult doing work/chores Not difficult at all      04/22/2023    1:14 PM 03/18/2023    9:31 AM 10/01/2022    2:02 PM 09/09/2017    3:50 PM  GAD 7 : Generalized Anxiety Score  Nervous, Anxious, on Edge 3 0 1   Control/stop worrying 3 1 2    Worry too much - different things 2  1 2    Trouble relaxing 0 0 0   Restless 2 0 0   Easily annoyed or irritable 2 1 0   Afraid - awful might happen 2 0 0   Total GAD 7 Score 14 3 5    Anxiety Difficulty Somewhat difficult Not difficult at all       Information is confidential and restricted. Go to Review Flowsheets to unlock data.         Objective:   Physical Exam NAD.  Alert, oriented.  Fatigued in appearance.  Calm affect.  Making good eye contact.  Speech clear.  Thoughts logical coherent and relevant.  Lungs clear.  Heart regular rate rhythm.  No murmur or gallop noted. Today's Vitals   04/22/23 1304 04/22/23 1439 04/22/23 1440 04/22/23 1441  BP: 114/78 138/74 112/62 112/66  Pulse: (!) 105     Temp: 98.4 F (36.9 C)     SpO2: 99%     Weight: 179 lb (81.2 kg)     Height: 5\' 5"  (1.651 m)      Body mass index is 29.79 kg/m. Orthostatic BP stable from sitting to standing position with no dizziness noted. Results for orders placed or performed in visit on 03/18/23  Basic Metabolic Panel   Collection Time: 03/24/23  4:18 PM  Result Value Ref Range   Glucose  110 (H) 70 - 99 mg/dL   BUN 7 6 - 24 mg/dL   Creatinine, Ser 5.78 0.57 - 1.00 mg/dL   eGFR 469 >62 XB/MWU/1.32   BUN/Creatinine Ratio 11 9 - 23   Sodium 142 134 - 144 mmol/L   Potassium 4.6 3.5 - 5.2 mmol/L   Chloride 98 96 - 106 mmol/L   CO2 27 20 - 29 mmol/L   Calcium 9.9 8.7 - 10.2 mg/dL  Microalbumin/Creatinine Ratio, Urine   Collection Time: 03/24/23  4:18 PM  Result Value Ref Range   Creatinine, Urine 20.3 Not Estab. mg/dL   Microalbumin, Urine <4.4 Not Estab. ug/mL   Microalb/Creat Ratio <15 0 - 29 mg/g creat   *Note: Due to a large number of results and/or encounters for the requested time period, some results have not been displayed. A complete set of results can be found in Results Review.   eGFR has been stable since her ED visit.      Assessment & Plan:  Preop examination    With normal exam today and stable orthostatic BP and  normal kidney function, will complete her preop forms for clearance for upcoming bladder surgery.  Patient to contact the office in the meantime if any problems. Routine follow-up here for her chronic health problems. Return in about 3 months (around 07/20/2023).

## 2023-04-25 ENCOUNTER — Telehealth: Payer: Self-pay

## 2023-04-25 NOTE — Telephone Encounter (Signed)
 Called [facility] to get surgical clearance form faxed to [place] for pt, no answer

## 2023-04-27 ENCOUNTER — Encounter: Payer: Self-pay | Admitting: Nurse Practitioner

## 2023-04-28 ENCOUNTER — Other Ambulatory Visit: Payer: Self-pay

## 2023-05-01 ENCOUNTER — Encounter: Payer: Self-pay | Admitting: Nurse Practitioner

## 2023-05-04 ENCOUNTER — Other Ambulatory Visit: Payer: Self-pay

## 2023-05-04 ENCOUNTER — Other Ambulatory Visit: Payer: Self-pay | Admitting: Neurology

## 2023-05-04 ENCOUNTER — Other Ambulatory Visit (HOSPITAL_COMMUNITY): Payer: Self-pay

## 2023-05-04 ENCOUNTER — Other Ambulatory Visit: Payer: Self-pay | Admitting: Allergy & Immunology

## 2023-05-04 MED ORDER — AMITRIPTYLINE HCL 100 MG PO TABS
100.0000 mg | ORAL_TABLET | Freq: Every day | ORAL | 2 refills | Status: DC
  Filled 2023-05-04: qty 30, 30d supply, fill #0
  Filled 2023-06-06: qty 30, 30d supply, fill #1

## 2023-05-04 MED ORDER — MONTELUKAST SODIUM 10 MG PO TABS
10.0000 mg | ORAL_TABLET | Freq: Every day | ORAL | 5 refills | Status: AC
Start: 2023-05-04 — End: ?
  Filled 2023-05-04: qty 30, 30d supply, fill #0
  Filled 2023-07-26: qty 30, 30d supply, fill #1
  Filled 2023-08-15 – 2023-08-22 (×2): qty 30, 30d supply, fill #2
  Filled 2023-10-04: qty 30, 30d supply, fill #3
  Filled 2023-11-02: qty 30, 30d supply, fill #4
  Filled 2023-11-22 – 2023-11-29 (×2): qty 30, 30d supply, fill #5

## 2023-05-09 ENCOUNTER — Other Ambulatory Visit: Payer: Self-pay | Admitting: Nurse Practitioner

## 2023-05-10 ENCOUNTER — Other Ambulatory Visit: Payer: Self-pay

## 2023-05-17 ENCOUNTER — Other Ambulatory Visit: Payer: Self-pay

## 2023-05-17 ENCOUNTER — Other Ambulatory Visit (HOSPITAL_COMMUNITY): Payer: Self-pay

## 2023-05-20 DIAGNOSIS — N819 Female genital prolapse, unspecified: Secondary | ICD-10-CM | POA: Diagnosis not present

## 2023-05-23 ENCOUNTER — Encounter (HOSPITAL_COMMUNITY): Payer: Self-pay | Admitting: Obstetrics and Gynecology

## 2023-05-23 NOTE — Progress Notes (Addendum)
 Spoke w/ via phone for pre-op interview--- [name] needs dos----  CMP, CBC, RPR, HIV, HIV4GL and T&S per surgeon. A1C and CBG per anesthesia.       Smith results------Current EKG in Epic dated 03/16/23. COVID test -----patient states asymptomatic no test needed Arrive at -------0530 NPO after MN NO Solid Food.  Pre-Surgery Ensure or G2:  Med rec completed Medications to take morning of surgery -----Gabapentin, eye gtts, nasal spray, Protonix, Allopurinol. Xanax and Flexeril PRN  Diabetic medication -----  GLP1 agonist last dose: Mounjaro 05/21/23. GLP1 instructions: Hold Mounjaro doses until after surgery, patient verbalized understanding.  Patient instructed no nail polish to be worn day of surgery Patient instructed to bring photo id and insurance card day of surgery Patient aware to have Driver (ride ) / caregiver    for 24 hours after surgery - Husband [name] Patient Special Instructions ----- Shower with antibacterial soap. Pre-Op special Instructions -----  Patient verbalized understanding of instructions that were given at this phone interview. Patient denies chest pain, sob, fever, cough at the interview.   Medical clearance in Epic by Sherie Don, NP dated 04/22/23.

## 2023-05-30 NOTE — H&P (Signed)
 [name] is an 53 y.o. female. She is S/P hysterectomy and S/P single incision sub urethral sling. She C/O discomfort with vaginal prolapse. She has no trouble emptying bladder and no straining with BM.   Pertinent Gynecological History: Menses:  Bleeding:  Contraception:  DES exposure: denies Blood transfusions: none Sexually transmitted diseases: no past history Previous GYN Procedures:  Last mammogram: normal Date: 5/24 Last pap: normal Date: 4/22 OB History: G3, P2   Menstrual History: Menarche age:  No LMP recorded. Patient has had a hysterectomy.    Past Medical History:  Diagnosis Date   Allergy    Anxiety    Diabetes mellitus without complication (HCC)    Gout    History of migraine headaches    Hx of migraines    Hypertension not at goal 03/26/2016   IBS (irritable bowel syndrome)    Impaired fasting glucose     Past Surgical History:  Procedure Laterality Date   ABDOMINAL HYSTERECTOMY  2005   partial   BLADDER SUSPENSION  2012   BREAST REDUCTION SURGERY  2002   CHOLECYSTECTOMY  2005   COLONOSCOPY  07/10/2021   EYE SURGERY  1999   laser    UPPER GASTROINTESTINAL ENDOSCOPY     20 years ago by Dr. Jena Gauss. - told she had ulcers    Family History  Problem Relation Age of Onset   Diabetes Mother    Neuropathy Mother    Heart failure Mother    Hypertension Mother    Cancer Mother    Asthma Father    Atrial fibrillation Father    Hypertension Father    Colonic polyp Father     Social History:  reports that she has never smoked. She has never used smokeless tobacco. She reports current alcohol use. She reports that she does not use drugs.  Allergies:  Allergies  Allergen Reactions   Omnicef [Cefdinir] Other (See Comments)    Patient states antibiotic does not work. Has tried multiple times.   Losartan Rash    Broke out on face only; no allergic reaction    No medications prior to admission.    Review of Systems  Constitutional:  Negative  for fever.    Height 5\' 6"  (1.676 m), weight 80.3 kg. Physical Exam Cardiovascular:     Rate and Rhythm: Normal rate.  Pulmonary:     Effort: Pulmonary effort is normal.    Pelvic-Stage 3 cystocele, Stage 1 rectocele  No results found. However, due to the size of the patient record, not all encounters were searched. Please check Results Review for a complete set of results.  No results found.  Assessment/Plan: 53 yo with symptomatic pelvic prolapse D/W A&P vaginal repair and possible sacrospinous ligament suspension and risks including infection, organ damage, bleeding/transfusion-HIV/Hep, DVT/PE, pneumonia, recurrent prolapse, vulvar pain, fistula formation. She states she understands and agrees.  [name] II 05/30/2023, 6:04 PM

## 2023-05-31 ENCOUNTER — Encounter (HOSPITAL_COMMUNITY): Payer: Self-pay | Admitting: Obstetrics and Gynecology

## 2023-05-31 ENCOUNTER — Other Ambulatory Visit: Payer: Self-pay

## 2023-05-31 ENCOUNTER — Encounter (HOSPITAL_COMMUNITY)
Admission: RE | Disposition: A | Discharge status: Home / Self Care | Payer: Self-pay | Source: Home / Self Care | Attending: Obstetrics and Gynecology

## 2023-05-31 ENCOUNTER — Ambulatory Visit (HOSPITAL_COMMUNITY): Admitting: Certified Registered Nurse Anesthetist

## 2023-05-31 ENCOUNTER — Observation Stay (HOSPITAL_COMMUNITY)
Admission: RE | Admit: 2023-05-31 | Discharge: 2023-05-31 | Disposition: A | Discharge status: Home / Self Care | Payer: Commercial Managed Care - PPO | Attending: Obstetrics and Gynecology | Admitting: Obstetrics and Gynecology

## 2023-05-31 ENCOUNTER — Ambulatory Visit (HOSPITAL_BASED_OUTPATIENT_CLINIC_OR_DEPARTMENT_OTHER): Admitting: Certified Registered Nurse Anesthetist

## 2023-05-31 DIAGNOSIS — N8111 Cystocele, midline: Principal | ICD-10-CM

## 2023-05-31 DIAGNOSIS — N8189 Other female genital prolapse: Principal | ICD-10-CM | POA: Insufficient documentation

## 2023-05-31 DIAGNOSIS — I10 Essential (primary) hypertension: Secondary | ICD-10-CM | POA: Diagnosis not present

## 2023-05-31 DIAGNOSIS — N819 Female genital prolapse, unspecified: Secondary | ICD-10-CM | POA: Diagnosis not present

## 2023-05-31 DIAGNOSIS — E119 Type 2 diabetes mellitus without complications: Secondary | ICD-10-CM | POA: Diagnosis not present

## 2023-05-31 DIAGNOSIS — E1140 Type 2 diabetes mellitus with diabetic neuropathy, unspecified: Secondary | ICD-10-CM

## 2023-05-31 HISTORY — PX: ANTERIOR AND POSTERIOR REPAIR WITH SACROSPINOUS FIXATION: SHX6536

## 2023-05-31 HISTORY — PX: CYSTOSCOPY: SHX5120

## 2023-05-31 LAB — CBC
HCT: 38.8 % (ref 36.0–46.0)
Hemoglobin: 12.7 g/dL (ref 12.0–15.0)
MCH: 29.3 pg (ref 26.0–34.0)
MCHC: 32.7 g/dL (ref 30.0–36.0)
MCV: 89.4 fL (ref 80.0–100.0)
Platelets: 276 10*3/uL (ref 150–400)
RBC: 4.34 MIL/uL (ref 3.87–5.11)
RDW: 14.8 % (ref 11.5–15.5)
WBC: 5.8 10*3/uL (ref 4.0–10.5)
nRBC: 0 % (ref 0.0–0.2)

## 2023-05-31 LAB — COMPREHENSIVE METABOLIC PANEL WITH GFR
ALT: 42 U/L (ref 0–44)
AST: 39 U/L (ref 15–41)
Albumin: 3.7 g/dL (ref 3.5–5.0)
Alkaline Phosphatase: 54 U/L (ref 38–126)
Anion gap: 15 (ref 5–15)
BUN: 6 mg/dL (ref 6–20)
CO2: 24 mmol/L (ref 22–32)
Calcium: 9 mg/dL (ref 8.9–10.3)
Chloride: 100 mmol/L (ref 98–111)
Creatinine, Ser: 0.73 mg/dL (ref 0.44–1.00)
GFR, Estimated: >60 mL/min (ref >60)
Glucose, Bld: 160 mg/dL — ABNORMAL HIGH (ref 70–99)
Potassium: 3.7 mmol/L (ref 3.5–5.1)
Sodium: 139 mmol/L (ref 135–145)
Total Bilirubin: 0.5 mg/dL (ref 0.0–1.2)
Total Protein: 6.5 g/dL (ref 6.5–8.1)

## 2023-05-31 LAB — TYPE AND SCREEN
ABO/RH(D): AB POS
Antibody Screen: NEGATIVE

## 2023-05-31 LAB — HEMOGLOBIN A1C
Hgb A1c MFr Bld: 6.1 % — ABNORMAL HIGH (ref 4.8–5.6)
Mean Plasma Glucose: 128.37 mg/dL

## 2023-05-31 LAB — GLUCOSE, CAPILLARY
Glucose-Capillary: 154 mg/dL — ABNORMAL HIGH (ref 70–99)
Glucose-Capillary: 156 mg/dL — ABNORMAL HIGH (ref 70–99)

## 2023-05-31 LAB — ABO/RH: ABO/RH(D): AB POS

## 2023-05-31 SURGERY — ANTERIOR AND POSTERIOR REPAIR WITH SACROSPINOUS FIXATION
Anesthesia: General | Site: Vagina

## 2023-05-31 MED ORDER — ROCURONIUM BROMIDE 10 MG/ML (PF) SYRINGE
PREFILLED_SYRINGE | INTRAVENOUS | Status: AC
  Filled 2023-05-31: qty 10

## 2023-05-31 MED ORDER — MIDAZOLAM HCL 2 MG/2ML IJ SOLN
INTRAMUSCULAR | Status: AC
Start: 2023-05-31 — End: ?
  Filled 2023-05-31: qty 2

## 2023-05-31 MED ORDER — ACETAMINOPHEN 500 MG PO TABS: 1000.0000 mg | ORAL_TABLET | Freq: Four times a day (QID) | ORAL | 0 refills | Status: DC | PRN

## 2023-05-31 MED ORDER — CHLORHEXIDINE GLUCONATE 0.12 % MT SOLN
OROMUCOSAL | Status: AC
Start: 2023-05-31 — End: 2023-05-31
  Filled 2023-05-31: qty 15

## 2023-05-31 MED ORDER — FENTANYL CITRATE (PF) 100 MCG/2ML IJ SOLN
25.0000 ug | INTRAMUSCULAR | Status: DC | PRN
  Administered 2023-05-31 (×2): 25 ug via INTRAVENOUS

## 2023-05-31 MED ORDER — LIDOCAINE-EPINEPHRINE 1 %-1:100000 IJ SOLN
INTRAMUSCULAR | Status: AC
  Filled 2023-05-31: qty 1

## 2023-05-31 MED ORDER — PANTOPRAZOLE SODIUM 40 MG PO TBEC
40.0000 mg | DELAYED_RELEASE_TABLET | Freq: Two times a day (BID) | ORAL | Status: DC
  Administered 2023-05-31: 40 mg via ORAL
  Filled 2023-05-31: qty 1

## 2023-05-31 MED ORDER — EPINEPHRINE 0.3 MG/0.3ML IJ SOAJ: 0.3000 mg | INTRAMUSCULAR | Status: DC | PRN

## 2023-05-31 MED ORDER — ACETAMINOPHEN 500 MG PO TABS
1000.0000 mg | ORAL_TABLET | Freq: Four times a day (QID) | ORAL | Status: DC
  Administered 2023-05-31 (×2): 1000 mg via ORAL
  Filled 2023-05-31 (×2): qty 2

## 2023-05-31 MED ORDER — LIDOCAINE 2% (20 MG/ML) 5 ML SYRINGE
INTRAMUSCULAR | Status: AC
  Filled 2023-05-31: qty 5

## 2023-05-31 MED ORDER — OXYCODONE HCL 5 MG/5ML PO SOLN: 5.0000 mg | Freq: Once | ORAL | Status: DC | PRN

## 2023-05-31 MED ORDER — CLINDAMYCIN PHOSPHATE 900 MG/50ML IV SOLN
INTRAVENOUS | Status: AC
  Filled 2023-05-31: qty 50

## 2023-05-31 MED ORDER — ORAL CARE MOUTH RINSE: 15.0000 mL | Freq: Once | OROMUCOSAL | Status: AC

## 2023-05-31 MED ORDER — LACTATED RINGERS IV SOLN: INTRAVENOUS | Status: DC

## 2023-05-31 MED ORDER — KETOROLAC TROMETHAMINE 30 MG/ML IJ SOLN
INTRAMUSCULAR | Status: DC | PRN
  Administered 2023-05-31: 15 mg via INTRAVENOUS

## 2023-05-31 MED ORDER — DEXAMETHASONE SODIUM PHOSPHATE 10 MG/ML IJ SOLN
INTRAMUSCULAR | Status: AC
  Filled 2023-05-31: qty 1

## 2023-05-31 MED ORDER — 0.9 % SODIUM CHLORIDE (POUR BTL) OPTIME
TOPICAL | Status: DC | PRN
  Administered 2023-05-31: 1000 mL

## 2023-05-31 MED ORDER — CHLORHEXIDINE GLUCONATE 0.12 % MT SOLN
15.0000 mL | Freq: Once | OROMUCOSAL | Status: AC
  Administered 2023-05-31: 15 mL via OROMUCOSAL

## 2023-05-31 MED ORDER — HYDROMORPHONE HCL 1 MG/ML IJ SOLN: 0.2000 mg | INTRAMUSCULAR | Status: DC | PRN

## 2023-05-31 MED ORDER — PROPOFOL 10 MG/ML IV BOLUS
INTRAVENOUS | Status: DC | PRN
  Administered 2023-05-31: 140 mg via INTRAVENOUS

## 2023-05-31 MED ORDER — DROPERIDOL 2.5 MG/ML IJ SOLN: 0.6250 mg | Freq: Once | INTRAMUSCULAR | Status: DC | PRN

## 2023-05-31 MED ORDER — INSULIN ASPART 100 UNIT/ML IJ SOLN
INTRAMUSCULAR | Status: AC
  Filled 2023-05-31: qty 1

## 2023-05-31 MED ORDER — ROCURONIUM BROMIDE 10 MG/ML (PF) SYRINGE
PREFILLED_SYRINGE | INTRAVENOUS | Status: DC | PRN
  Administered 2023-05-31: 10 mg via INTRAVENOUS
  Administered 2023-05-31: 50 mg via INTRAVENOUS

## 2023-05-31 MED ORDER — DOCUSATE SODIUM 100 MG PO CAPS: 100.0000 mg | ORAL_CAPSULE | Freq: Two times a day (BID) | ORAL | Status: DC

## 2023-05-31 MED ORDER — SCOPOLAMINE 1 MG/3DAYS TD PT72
MEDICATED_PATCH | TRANSDERMAL | Status: AC
  Filled 2023-05-31: qty 1

## 2023-05-31 MED ORDER — SOD CITRATE-CITRIC ACID 500-334 MG/5ML PO SOLN: 30.0000 mL | ORAL | Status: DC

## 2023-05-31 MED ORDER — PHENYLEPHRINE HCL (PRESSORS) 10 MG/ML IV SOLN
INTRAVENOUS | Status: AC
  Filled 2023-05-31: qty 1

## 2023-05-31 MED ORDER — FENTANYL CITRATE (PF) 100 MCG/2ML IJ SOLN
INTRAMUSCULAR | Status: AC
  Filled 2023-05-31: qty 2

## 2023-05-31 MED ORDER — DEXAMETHASONE SODIUM PHOSPHATE 10 MG/ML IJ SOLN
INTRAMUSCULAR | Status: DC | PRN
  Administered 2023-05-31: 5 mg via INTRAVENOUS

## 2023-05-31 MED ORDER — ONDANSETRON HCL 4 MG PO TABS: 4.0000 mg | ORAL_TABLET | Freq: Four times a day (QID) | ORAL | Status: DC | PRN

## 2023-05-31 MED ORDER — ACETAMINOPHEN 10 MG/ML IV SOLN: 1000.0000 mg | Freq: Once | INTRAVENOUS | Status: DC | PRN

## 2023-05-31 MED ORDER — ONDANSETRON HCL 4 MG/2ML IJ SOLN
INTRAMUSCULAR | Status: DC | PRN
Start: 2023-05-31 — End: 2023-05-31
  Administered 2023-05-31: 4 mg via INTRAVENOUS

## 2023-05-31 MED ORDER — DEXMEDETOMIDINE HCL IN NACL 80 MCG/20ML IV SOLN
INTRAVENOUS | Status: AC
  Filled 2023-05-31: qty 20

## 2023-05-31 MED ORDER — OXYCODONE HCL 5 MG PO TABS: 5.0000 mg | ORAL_TABLET | Freq: Once | ORAL | Status: DC | PRN

## 2023-05-31 MED ORDER — PHENYLEPHRINE HCL-NACL 20-0.9 MG/250ML-% IV SOLN
INTRAVENOUS | Status: DC | PRN
  Administered 2023-05-31: 80 ug/min via INTRAVENOUS

## 2023-05-31 MED ORDER — ONDANSETRON HCL 4 MG/2ML IJ SOLN
INTRAMUSCULAR | Status: AC
  Filled 2023-05-31: qty 2

## 2023-05-31 MED ORDER — LIDOCAINE 2% (20 MG/ML) 5 ML SYRINGE
INTRAMUSCULAR | Status: DC | PRN
  Administered 2023-05-31: 40 mg via INTRAVENOUS

## 2023-05-31 MED ORDER — PROPOFOL 10 MG/ML IV BOLUS
INTRAVENOUS | Status: AC
  Filled 2023-05-31: qty 20

## 2023-05-31 MED ORDER — LIDOCAINE-EPINEPHRINE 1 %-1:100000 IJ SOLN
INTRAMUSCULAR | Status: DC | PRN
  Administered 2023-05-31: 18 mL

## 2023-05-31 MED ORDER — SODIUM CHLORIDE 0.9 % IR SOLN
Status: DC | PRN
Start: 2023-05-31 — End: 2023-05-31
  Administered 2023-05-31: 1000 mL

## 2023-05-31 MED ORDER — ONDANSETRON HCL 4 MG/2ML IJ SOLN: 4.0000 mg | Freq: Four times a day (QID) | INTRAMUSCULAR | Status: DC | PRN

## 2023-05-31 MED ORDER — FENTANYL CITRATE (PF) 250 MCG/5ML IJ SOLN
INTRAMUSCULAR | Status: AC
  Filled 2023-05-31: qty 5

## 2023-05-31 MED ORDER — KETOROLAC TROMETHAMINE 30 MG/ML IJ SOLN
INTRAMUSCULAR | Status: AC
  Filled 2023-05-31: qty 1

## 2023-05-31 MED ORDER — LACTATED RINGERS IV BOLUS: 500.0000 mL | Freq: Once | INTRAVENOUS | Status: DC

## 2023-05-31 MED ORDER — CLINDAMYCIN PHOSPHATE 900 MG/50ML IV SOLN
900.0000 mg | INTRAVENOUS | Status: AC
Start: 2023-05-31 — End: 2023-05-31
  Administered 2023-05-31: 900 mg via INTRAVENOUS

## 2023-05-31 MED ORDER — ACETAMINOPHEN 500 MG PO TABS
1000.0000 mg | ORAL_TABLET | Freq: Once | ORAL | Status: AC
  Administered 2023-05-31: 1000 mg via ORAL

## 2023-05-31 MED ORDER — ALPRAZOLAM 0.5 MG PO TABS: 1.0000 mg | ORAL_TABLET | Freq: Two times a day (BID) | ORAL | Status: DC | PRN

## 2023-05-31 MED ORDER — MIDAZOLAM HCL 5 MG/5ML IJ SOLN
INTRAMUSCULAR | Status: DC | PRN
  Administered 2023-05-31: 2 mg via INTRAVENOUS

## 2023-05-31 MED ORDER — PHENYLEPHRINE 80 MCG/ML (10ML) SYRINGE FOR IV PUSH (FOR BLOOD PRESSURE SUPPORT)
PREFILLED_SYRINGE | INTRAVENOUS | Status: DC | PRN
Start: 2023-05-31 — End: 2023-05-31
  Administered 2023-05-31 (×2): 160 ug via INTRAVENOUS
  Administered 2023-05-31: 240 ug via INTRAVENOUS
  Administered 2023-05-31: 80 ug via INTRAVENOUS

## 2023-05-31 MED ORDER — ESTRADIOL 0.1 MG/GM VA CREA
TOPICAL_CREAM | VAGINAL | Status: AC
  Filled 2023-05-31: qty 42.5

## 2023-05-31 MED ORDER — INSULIN ASPART 100 UNIT/ML IJ SOLN
0.0000 [IU] | INTRAMUSCULAR | Status: DC | PRN
  Administered 2023-05-31: 2 [IU] via SUBCUTANEOUS

## 2023-05-31 MED ORDER — PHENYLEPHRINE 80 MCG/ML (10ML) SYRINGE FOR IV PUSH (FOR BLOOD PRESSURE SUPPORT)
PREFILLED_SYRINGE | INTRAVENOUS | Status: AC
  Filled 2023-05-31: qty 10

## 2023-05-31 MED ORDER — GABAPENTIN 400 MG PO CAPS
800.0000 mg | ORAL_CAPSULE | Freq: Three times a day (TID) | ORAL | Status: DC
  Administered 2023-05-31: 800 mg via ORAL
  Filled 2023-05-31: qty 2

## 2023-05-31 MED ORDER — OXYCODONE HCL 5 MG PO TABS
5.0000 mg | ORAL_TABLET | ORAL | Status: DC | PRN
  Administered 2023-05-31 (×3): 5 mg via ORAL
  Filled 2023-05-31 (×3): qty 1

## 2023-05-31 MED ORDER — SCOPOLAMINE 1 MG/3DAYS TD PT72
1.0000 | MEDICATED_PATCH | TRANSDERMAL | Status: DC
  Administered 2023-05-31: 1.5 mg via TRANSDERMAL

## 2023-05-31 MED ORDER — METHOCARBAMOL 500 MG PO TABS: 500.0000 mg | ORAL_TABLET | Freq: Four times a day (QID) | ORAL | 0 refills | Status: DC | PRN

## 2023-05-31 MED ORDER — ACETAMINOPHEN 500 MG PO TABS
ORAL_TABLET | ORAL | Status: AC
  Filled 2023-05-31: qty 2

## 2023-05-31 MED ORDER — FENTANYL CITRATE (PF) 100 MCG/2ML IJ SOLN
INTRAMUSCULAR | Status: DC | PRN
Start: 2023-05-31 — End: 2023-05-31
  Administered 2023-05-31: 75 ug via INTRAVENOUS
  Administered 2023-05-31: 25 ug via INTRAVENOUS

## 2023-05-31 MED ORDER — DEXMEDETOMIDINE HCL IN NACL 80 MCG/20ML IV SOLN
INTRAVENOUS | Status: DC | PRN
  Administered 2023-05-31: 8 ug via INTRAVENOUS

## 2023-05-31 MED ORDER — GENTAMICIN SULFATE 40 MG/ML IJ SOLN
5.0000 mg/kg | INTRAVENOUS | Status: AC
  Administered 2023-05-31: 400 mg via INTRAVENOUS
  Filled 2023-05-31: qty 10

## 2023-05-31 MED ORDER — FLUORESCEIN SODIUM 10 % IV SOLN
INTRAVENOUS | Status: AC
  Filled 2023-05-31: qty 5

## 2023-05-31 MED ORDER — OXYCODONE HCL 5 MG PO TABS: 5.0000 mg | ORAL_TABLET | Freq: Four times a day (QID) | ORAL | 0 refills | Status: DC | PRN

## 2023-05-31 MED ORDER — POVIDONE-IODINE 10 % EX SWAB: 2.0000 | Freq: Once | CUTANEOUS | Status: DC

## 2023-05-31 MED ORDER — ALUM & MAG HYDROXIDE-SIMETH 200-200-20 MG/5ML PO SUSP: 30.0000 mL | ORAL | Status: DC | PRN

## 2023-05-31 MED ORDER — METHOCARBAMOL 500 MG PO TABS
500.0000 mg | ORAL_TABLET | Freq: Four times a day (QID) | ORAL | Status: DC | PRN
  Administered 2023-05-31 (×2): 500 mg via ORAL
  Filled 2023-05-31 (×2): qty 1

## 2023-05-31 MED ORDER — SUGAMMADEX SODIUM 200 MG/2ML IV SOLN
INTRAVENOUS | Status: DC | PRN
  Administered 2023-05-31: 200 mg via INTRAVENOUS

## 2023-05-31 SURGICAL SUPPLY — 36 items
BLADE SURG 11 STRL SS (BLADE) IMPLANT
BLADE SURG 15 STRL LF DISP TIS (BLADE) ×2 IMPLANT
CATH FOLEY 2WAY SLVR 5CC 16FR (CATHETERS) ×2 IMPLANT
COVER MAYO STAND STRL (DRAPES) ×2 IMPLANT
DERMABOND ADVANCED .7 DNX12 (GAUZE/BANDAGES/DRESSINGS) IMPLANT
DEVICE CAPIO SLIM SINGLE (INSTRUMENTS) ×2 IMPLANT
DISSECTOR ROUND CHERRY 3/8 STR (MISCELLANEOUS) IMPLANT
GAUZE 4X4 16PLY ~~LOC~~+RFID DBL (SPONGE) ×2 IMPLANT
GAUZE PACKING 1INX5YD STRL (GAUZE/BANDAGES/DRESSINGS) ×2 IMPLANT
GLOVE BIO SURGEON STRL SZ8 (GLOVE) ×4 IMPLANT
GOWN STRL REUS W/TWL XL LVL3 (GOWN DISPOSABLE) ×2 IMPLANT
HOLDER FOLEY CATH W/STRAP (MISCELLANEOUS) ×2 IMPLANT
IV NS 1000ML BAXH (IV SOLUTION) IMPLANT
KIT TURNOVER KIT B (KITS) ×2 IMPLANT
NDL HYPO 22X1.5 SAFETY MO (MISCELLANEOUS) ×2 IMPLANT
NDL MAYO CATGUT SZ4 TPR NDL (NEEDLE) ×2 IMPLANT
NEEDLE HYPO 22X1.5 SAFETY MO (MISCELLANEOUS) ×2 IMPLANT
NEEDLE MAYO CATGUT SZ4 (NEEDLE) ×2 IMPLANT
NS IRRIG 1000ML POUR BTL (IV SOLUTION) IMPLANT
NS IRRIG 500ML POUR BTL (IV SOLUTION) ×2 IMPLANT
PACK VAGINAL WOMENS (CUSTOM PROCEDURE TRAY) ×2 IMPLANT
PAD OB MATERNITY 11 LF (PERSONAL CARE ITEMS) ×2 IMPLANT
PLUG CATH AND CAP STRL 200 (CATHETERS) IMPLANT
SET IRRIG Y TYPE TUR BLADDER L (SET/KITS/TRAYS/PACK) IMPLANT
SLEEVE SCD COMPRESS KNEE MED (STOCKING) ×2 IMPLANT
SPIKE FLUID TRANSFER (MISCELLANEOUS) IMPLANT
SPONGE T-LAP 4X18 ~~LOC~~+RFID (SPONGE) IMPLANT
SUT CAPIO POLYGLYCOLIC (SUTURE) IMPLANT
SUT MNCRL 0 MO-4 VIOLET 18 CR (SUTURE) ×2 IMPLANT
SUT MON AB 2-0 CT1 36 (SUTURE) ×4 IMPLANT
SUT VIC AB 2-0 CT2 27 (SUTURE) ×4 IMPLANT
SUT VIC AB 2-0 UR6 27 (SUTURE) IMPLANT
SYR BULB IRRIG 60ML STRL (SYRINGE) ×2 IMPLANT
TOWEL GREEN STERILE (TOWEL DISPOSABLE) ×2 IMPLANT
TRAY FOLEY W/BAG SLVR 14FR LF (SET/KITS/TRAYS/PACK) ×2 IMPLANT
WATER STERILE IRR 500ML POUR (IV SOLUTION) ×2 IMPLANT

## 2023-05-31 NOTE — Anesthesia Preprocedure Evaluation (Addendum)
 Anesthesia Evaluation  Patient identified by MRN, date of birth, ID band Patient awake    Reviewed: Allergy & Precautions, NPO status , Patient's Chart, lab work & pertinent test results  Airway Mallampati: III  TM Distance: <3 FB Neck ROM: Full    Dental  (+) Teeth Intact, Dental Advisory Given   Pulmonary neg pulmonary ROS   breath sounds clear to auscultation       Cardiovascular hypertension,  Rhythm:Regular Rate:Normal     Neuro/Psych  Headaches  Anxiety      Neuromuscular disease    GI/Hepatic Neg liver ROS,GERD  Medicated,,  Endo/Other  diabetes, Type 2, Oral Hypoglycemic Agents    Renal/GU negative Renal ROS     Musculoskeletal   Abdominal   Peds  Hematology   Anesthesia Other Findings   Reproductive/Obstetrics                             Anesthesia Physical Anesthesia Plan  ASA: 2  Anesthesia Plan: General   Post-op Pain Management: Tylenol PO (pre-op)* and Toradol IV (intra-op)*   Induction: Intravenous  PONV Risk Score and Plan: 4 or greater and Ondansetron, Dexamethasone, Midazolam and Scopolamine patch - Pre-op  Airway Management Planned: Oral ETT  Additional Equipment: None  Intra-op Plan:   Post-operative Plan: Extubation in OR  Informed Consent: I have reviewed the patients History and Physical, chart, labs and discussed the procedure including the risks, benefits and alternatives for the proposed anesthesia with the patient or authorized representative who has indicated his/her understanding and acceptance.     Dental advisory given  Plan Discussed with: CRNA  Anesthesia Plan Comments:        Anesthesia Quick Evaluation

## 2023-05-31 NOTE — Progress Notes (Signed)
 05/31/2023 8:44 PM Pt. States her pharmacy has closed for the evening, will not be able to get rx until tomorrow morning. Pt. States she still wishes to leave the hospital at this time agreeable with home pain management with tylenol and ibuprofen until morning. Scheduled tylenol and prn oxycodone given to patient prior to d/c home.  Bryttani Blew, Blanchard Kelch

## 2023-05-31 NOTE — Op Note (Unsigned)
 [name], [name] MEDICAL RECORD NO: [id number] ACCOUNT NO: [id number] DATE OF BIRTH: Jul 03, 1970 FACILITY: [facility] LOCATION: [facility] PHYSICIAN: Guy Sandifer. Arleta Creek, MD  Operative Report   DATE OF PROCEDURE: 05/31/2023  PREOPERATIVE DIAGNOSIS: Pelvic prolapse.  POSTOPERATIVE DIAGNOSIS: Pelvic prolapse.  PROCEDURE PERFORMED:  Anterior and posterior colporrhaphy, sacrospinous ligament fixation, and cystoscopy.  SURGEON:  Harold Hedge, MD  ASSISTANT:  Synthia Innocent.  ANESTHESIA:  General with endotracheal intubation.  ESTIMATED BLOOD LOSS:  25 mL.  SPECIMENS:  None.  INDICATIONS AND CONSENT:  This patient is a 53 year old patient status post hysterectomy and single incision suburethral sling with symptomatic pelvic prolapse.  Anterior-posterior colporrhaphy and possible sacrospinous ligament fixation has been  discussed preoperatively.  Potential risks and complications have been discussed preoperatively including but not limited to infection, organ damage, bleeding requiring transfusion of blood products with HIV and hepatitis acquisition, DVT, PE, pneumonia,  fistula formation, vulvar pain, and recurrent prolapse.  She states she understands and agrees and consent is signed on the chart.  DESCRIPTION OF PROCEDURE:  The patient was taken to the operating room where she was identified, placed in the dorsal supine position, and general anesthesia was induced via endotracheal intubation.  She was then placed in the dorsal lithotomy position.   Timeout was done.  She was prepped vaginally with Betadine, bladder straight catheterized, and draped in a sterile fashion.  The anterior vaginal mucosa was then injected submucosally with approximately 8 mL of 1% lidocaine with epinephrine.  An  inverted T-shaped incision was made at the apex of the anterior vaginal wall.  The mucosa was then dissected sharply and bluntly from the underlying bladder to a point approximately 2 cm below the urethral  meatus.  The mucosa is widely dissected  bilaterally.  The cystocele was reduced with interrupted figure-of-eight 0 Monocryl sutures.  Excess mucosa was trimmed and the anterior mucosa was closed in a running locking fashion with 2-0 Monocryl suture.  The posterior vaginal mucosa was then  injected submucosally with approximately 8 mL of the same solution.  The mucosa was taken down along the approximately three-quarters of the length of the posterior vaginal wall and then dissected bilaterally from the underlying rectum sharply and  bluntly.  Blunt dissection was also used to identify the right ischial spine and the sacrospinous ligament.  Then, using the Capio needle driver and Monocryl suture, the suture was placed through the sacrospinous ligament 1-2 fingerbreadths medial to the  ischial spine.  This was successful on the first attempt and this suture was also plicated to the superior anterior mucosal side of the vagina just to the right of midline with a figure-of-eight.  These were then held.  The rectocele was reduced with  interrupted 0 Monocryl figure-of-eights.  This gives good support posteriorly.  Excess posterior vaginal mucosa was trimmed and the upper one-third was closed in a running locking fashion and the suture is held.  At this point, the sacrospinous fixation  suture is tied down offering excellent support to the apex.  The remainder of the mucosa was closed in the midline with a continued running locking suture.  Good hemostasis was noted.  Cystoscopy was then done.  A 360-degree inspection reveals no foreign  bodies, no evidence of suture, and ureteral puff bilaterally.  The cystoscope was then removed.  Foley catheter was placed.  Vaginal pack with estrogen vaginal cream was placed as well.  All counts were correct.  The patient was awakened and taken to  the  recovery room in stable condition.    MUK D: 05/31/2023 8:59:22 am T: 05/31/2023 9:14:00 am  JOB: [id number]/ [id number]

## 2023-05-31 NOTE — Progress Notes (Signed)
 No change to H&P per patient history. Reviewed procedure-A&P repair with possible sacrospinous ligament suspension All questions answered Allergies: cefdinir>"doesn't work", losartan>rash on face She states she understands and agrees

## 2023-05-31 NOTE — Anesthesia Procedure Notes (Signed)
 Procedure Name: Intubation Date/Time: 05/31/2023 7:38 AM  Performed by: Bishop Limbo, CRNAPre-anesthesia Checklist: Patient identified, Emergency Drugs available, Suction available and Patient being monitored Patient Re-evaluated:Patient Re-evaluated prior to induction Oxygen Delivery Method: Circle System Utilized Preoxygenation: Pre-oxygenation with 100% oxygen Induction Type: IV induction Ventilation: Mask ventilation without difficulty Laryngoscope Size: Mac and 3 Grade View: Grade I Tube type: Oral Tube size: 7.0 mm Number of attempts: 1 Airway Equipment and Method: Stylet Placement Confirmation: ETT inserted through vocal cords under direct vision, positive ETCO2 and breath sounds checked- equal and bilateral Secured at: 21 cm Tube secured with: Tape Dental Injury: Teeth and Oropharynx as per pre-operative assessment

## 2023-05-31 NOTE — Transfer of Care (Signed)
 Immediate Anesthesia Transfer of Care Note  Patient: [name]  Procedure(s) Performed: ANTERIOR AND POSTERIOR REPAIR WITH SACROSPINOUS FIXATION (Vagina ) CYSTOSCOPY (Bladder)  Patient Location: PACU  Anesthesia Type:General  Level of Consciousness: oriented, drowsy, and patient cooperative  Airway & Oxygen Therapy: Patient Spontanous Breathing  Post-op Assessment: Report given to RN and Post -op Vital signs reviewed and stable  Post vital signs: Reviewed and stable  Last Vitals:  Vitals Value Taken Time  BP 100/65 05/31/23 0900  Temp    Pulse 104 05/31/23 0903  Resp 16 05/31/23 0903  SpO2 90 % 05/31/23 0903  Vitals shown include unfiled device data.  Last Pain:  Vitals:   05/31/23 0613  TempSrc: Oral  PainSc: 0-No pain      Patients Stated Pain Goal: 5 (05/31/23 2951)  Complications: No notable events documented.

## 2023-05-31 NOTE — Progress Notes (Signed)
 05/31/2023  8:51 AM  PATIENT:  [name]  53 y.o. female  PRE-OPERATIVE DIAGNOSIS: Pelvic Prolapse  POST-OPERATIVE DIAGNOSIS: Pelvic Prolapse  PROCEDURE:  Procedure(s): ANTERIOR AND POSTERIOR REPAIR WITH SACROSPINOUS FIXATION (N/A) CYSTOSCOPY  SURGEON:  Surgeons and Role:    * Harold Hedge, MD - Primary    * Lyn Henri, MD - Assisting  PHYSICIAN ASSISTANT:   ASSISTANTS:   ANESTHESIA:   general  EBL:  25 mL   BLOOD ADMINISTERED:none  DRAINS: Urinary Catheter (Foley)   LOCAL MEDICATIONS USED:  LIDOCAINE with epinephrine and Amount: 16 ml  SPECIMEN:  No Specimen  DISPOSITION OF SPECIMEN:  N/A  COUNTS:  YES  TOURNIQUET:  * No tourniquets in log *  DICTATION: .Other Dictation: Dictation Number [id number]  PLAN OF CARE: Admit for overnight observation  PATIENT DISPOSITION:  PACU - hemodynamically stable.   Delay start of Pharmacological VTE agent (>24hrs) due to surgical blood loss or risk of bleeding: not applicable

## 2023-05-31 NOTE — Progress Notes (Signed)
 05/31/2023 7:28 PM Pt. Request to release medical records sent to fax on file for [facility] Records per patient and family request. Confirmed receipt of fax.  Mozes Sagar, Blanchard Kelch

## 2023-05-31 NOTE — Progress Notes (Signed)
 Tolerating regular diet, ambulating in hallway Good pain relief Vaginal pack and foley out No void yet  Today's Vitals   05/31/23 1349 05/31/23 1510 05/31/23 1645 05/31/23 1702  BP:  115/78    Pulse:  99    Resp:  16    Temp:  98 F (36.7 C)    TempSrc:      SpO2:  96%    Weight:      Height:      PainSc: 1  1  4  4     Body mass index is 28.57 kg/m.   Abdomen soft Light spotting on pad  A/P: she wants to go home         Will discharge after she voids         Instructions given         FU office 2 weeks

## 2023-05-31 NOTE — Anesthesia Postprocedure Evaluation (Signed)
 Anesthesia Post Note  Patient: CRISTALLE ROHM  Procedure(s) Performed: ANTERIOR AND POSTERIOR REPAIR WITH SACROSPINOUS FIXATION (Vagina ) CYSTOSCOPY (Bladder)     Patient location during evaluation: PACU Anesthesia Type: General Level of consciousness: awake and alert Pain management: pain level controlled Vital Signs Assessment: post-procedure vital signs reviewed and stable Respiratory status: spontaneous breathing, nonlabored ventilation, respiratory function stable and patient connected to nasal cannula oxygen Cardiovascular status: blood pressure returned to baseline and stable Postop Assessment: no apparent nausea or vomiting Anesthetic complications: no  No notable events documented.  Last Vitals:  Vitals:   05/31/23 1115 05/31/23 1211  BP: 102/76 103/73  Pulse: 100 98  Resp: 16 16  Temp:    SpO2: 98% 97%    Last Pain:  Vitals:   05/31/23 1245  TempSrc:   PainSc: 4                  Shelton Silvas

## 2023-06-01 ENCOUNTER — Other Ambulatory Visit (HOSPITAL_COMMUNITY): Payer: Self-pay

## 2023-06-01 ENCOUNTER — Encounter: Payer: Self-pay | Admitting: Nurse Practitioner

## 2023-06-01 ENCOUNTER — Encounter (HOSPITAL_COMMUNITY): Payer: Self-pay | Admitting: Obstetrics and Gynecology

## 2023-06-01 ENCOUNTER — Other Ambulatory Visit: Payer: Self-pay

## 2023-06-02 ENCOUNTER — Other Ambulatory Visit (HOSPITAL_COMMUNITY): Payer: Self-pay

## 2023-06-02 ENCOUNTER — Other Ambulatory Visit (HOSPITAL_BASED_OUTPATIENT_CLINIC_OR_DEPARTMENT_OTHER): Payer: Self-pay

## 2023-06-02 NOTE — Discharge Summary (Signed)
 Physician Discharge Summary  Patient ID: [name] MRN: [id number] DOB/AGE: Nov 24, 1970 53 y.o.  Admit date: 05/31/2023 Discharge date: 06/02/2023  Admission Diagnoses:  Discharge Diagnoses:  Principal Problem:   Pelvic prolapse   Discharged Condition: good  Hospital Course: Taken to OR for A&P repair with SSLS. Postoperatively she is ambulating, tolerating regular diet, passing flatus. After the vaginal pack and foley catheter are removed she voids spontaneously. Good pain control. She wants to go home.  Consults: None  Significant Diagnostic Studies: labs:  Results for orders placed or performed during the hospital encounter of 05/31/23 (from the past 72 hours)  Glucose, capillary     Status: Abnormal   Collection Time: 05/31/23  6:27 AM  Result Value Ref Range   Glucose-Capillary 154 (H) 70 - 99 mg/dL    Comment: Glucose reference range applies only to samples taken after fasting for at least 8 hours.  CBC     Status: None   Collection Time: 05/31/23  6:28 AM  Result Value Ref Range   WBC 5.8 4.0 - 10.5 K/uL   RBC 4.34 3.87 - 5.11 MIL/uL   Hemoglobin 12.7 12.0 - 15.0 g/dL   HCT 40.9 81.1 - 91.4 %   MCV 89.4 80.0 - 100.0 fL   MCH 29.3 26.0 - 34.0 pg   MCHC 32.7 30.0 - 36.0 g/dL   RDW 78.2 95.6 - 21.3 %   Platelets 276 150 - 400 K/uL   nRBC 0.0 0.0 - 0.2 %    Comment: Performed at [facility], [address], [place], [place] [address]  Type and screen     Status: None   Collection Time: 05/31/23  6:28 AM  Result Value Ref Range   ABO/RH(D) AB POS    Antibody Screen NEG    Sample Expiration      06/03/2023,2359 Performed at [facility], [address] [place], [place] [address]   Comprehensive metabolic panel     Status: Abnormal   Collection Time: 05/31/23  6:28 AM  Result Value Ref Range   Sodium 139 135 - 145 mmol/L   Potassium 3.7 3.5 - 5.1 mmol/L   Chloride 100 98 - 111 mmol/L   CO2 24 22 - 32 mmol/L   Glucose, Bld 160 (H) 70 - 99 mg/dL     Comment: Glucose reference range applies only to samples taken after fasting for at least 8 hours.   BUN 6 6 - 20 mg/dL   Creatinine, Ser 2.95 0.44 - 1.00 mg/dL   Calcium 9.0 8.9 - 28.4 mg/dL   Total Protein 6.5 6.5 - 8.1 g/dL   Albumin 3.7 3.5 - 5.0 g/dL   AST 39 15 - 41 U/L   ALT 42 0 - 44 U/L   Alkaline Phosphatase 54 38 - 126 U/L   Total Bilirubin 0.5 0.0 - 1.2 mg/dL   GFR, Estimated >13 >24 mL/min    Comment: (NOTE) Calculated using the CKD-EPI Creatinine Equation (2021)    Anion gap 15 5 - 15    Comment: Performed at [facility], [address], [place], [place] [address]  Hemoglobin A1c per protocol     Status: Abnormal   Collection Time: 05/31/23  6:28 AM  Result Value Ref Range   Hgb A1c MFr Bld 6.1 (H) 4.8 - 5.6 %    Comment: (NOTE) Pre diabetes:          5.7%-6.4%  Diabetes:              >  6.4%  Glycemic control for   <7.0% adults with diabetes    Mean Plasma Glucose 128.37 mg/dL    Comment: Performed at [facility], [address] [place], [place] [address]  ABO/Rh     Status: None   Collection Time: 05/31/23  6:32 AM  Result Value Ref Range   ABO/RH(D)      AB POS Performed at [facility], [address], [place], [place] [address]   Glucose, capillary     Status: Abnormal   Collection Time: 05/31/23  9:05 AM  Result Value Ref Range   Glucose-Capillary 156 (H) 70 - 99 mg/dL    Comment: Glucose reference range applies only to samples taken after fasting for at least 8 hours.   *Note: Due to a large number of results and/or encounters for the requested time period, some results have not been displayed. A complete set of results can be found in Results Review.     Treatments: surgery: anterior and posterior vaginal repair with sacrospinous ligament suspension  Discharge Exam: Blood pressure (!) 151/90, pulse (!) 105, temperature 98.4 F (36.9 C), resp. rate 18, height 5\' 6"  (1.676 m), weight 80.3 kg, SpO2 100%. General appearance:  alert, cooperative, and no distress GI: soft, non-tender; bowel sounds normal; no masses,  no organomegaly  Disposition: Discharge disposition: 01-Home or Self Care        Allergies as of 05/31/2023       Reactions   Omnicef [cefdinir] Other (See Comments)   Patient states antibiotic does not work. Has tried multiple times.   Losartan Rash   Broke out on face only; no allergic reaction        Medication List     TAKE these medications    acetaminophen 500 MG tablet Commonly known as: TYLENOL Take 2 tablets (1,000 mg total) by mouth every 6 (six) hours as needed.   allopurinol 300 MG tablet Commonly known as: ZYLOPRIM Take 1 tablet (300 mg total) by mouth 2 (two) times daily.   ALPRAZolam 1 MG tablet Commonly known as: XANAX Take 1 tablet (1 mg total) by mouth 2 (two) times daily as needed. for anxiety   amitriptyline 100 MG tablet Commonly known as: ELAVIL Take 1 tablet (100 mg total) by mouth at bedtime.   cyanocobalamin 1000 MCG/ML injection Commonly known as: VITAMIN B12 Inject 1 mL into the muscle every 30 days.   cyclobenzaprine 10 MG tablet Commonly known as: FLEXERIL Take 1 tablet (10 mg total) by mouth daily as needed for muscle spasms.   Emgality 120 MG/ML Soaj Generic drug: Galcanezumab-gnlm Inject 120 mg into the skin every 28 (twenty-eight) days.   EPINEPHrine 0.3 mg/0.3 mL Soaj injection Commonly known as: EPI-PEN Inject 0.3 mg into the muscle as needed for anaphylaxis.   famotidine 40 MG tablet Commonly known as: PEPCID Take 1 tablet (40 mg total) by mouth at bedtime.   FeroSul 325 (65 Fe) MG tablet Generic drug: ferrous sulfate Take one tablet (325 mg) by mouth daily.   FREESTYLE LITE test strip Generic drug: glucose blood Use twice daily as directed to check blood sugar   gabapentin 800 MG tablet Commonly known as: Neurontin Take 1 tablet (800 mg total) by mouth 3 (three) times daily.   levocetirizine 5 MG tablet Commonly known  as: XYZAL Take 1 tablet (5 mg total) by mouth daily as needed for allergies (Can take an etxra dose during flare ups).   lidocaine-prilocaine cream Commonly known as:  EMLA Apply to feet as needed for nerve pain   magic mouthwash (nystatin, hydrocortisone, diphenhydrAMINE) suspension Swish and spit 5 MLs by mouth  3 times a day as needed   metFORMIN 500 MG tablet Commonly known as: GLUCOPHAGE Take 1 tablet (500 mg total) by mouth 2 (two) times daily with a meal.   methocarbamol 500 MG tablet Commonly known as: ROBAXIN Take 1 tablet (500 mg total) by mouth every 6 (six) hours as needed for muscle spasms.   Miebo 1.338 GM/ML Soln Generic drug: Perfluorohexyloctane Place 1 drop into both eyes up to 4 (four) times daily as directed.   montelukast 10 MG tablet Commonly known as: SINGULAIR Take 1 tablet (10 mg total) by mouth daily.   Mounjaro 15 MG/0.5ML Pen Generic drug: tirzepatide Inject 15 mg into the skin once a week.   omega-3 acid ethyl esters 1 g capsule Commonly known as: LOVAZA Take 2 capsules (2 g total) by mouth 2 (two) times daily.   oxyCODONE 5 MG immediate release tablet Commonly known as: Oxy IR/ROXICODONE Take 1 tablet (5 mg total) by mouth every 6 (six) hours as needed for moderate pain (pain score 4-6).   pantoprazole 40 MG tablet Commonly known as: PROTONIX Take 1 tablet (40 mg total) by mouth 2 (two) times daily before a meal.   rosuvastatin 10 MG tablet Commonly known as: CRESTOR Take 1 tablet (10 mg total) by mouth at bedtime.   Tyrvaya 0.03 MG/ACT Soln Generic drug: Varenicline Tartrate Use 2 times a day as directed   Tyrvaya 0.03 MG/ACT Soln Generic drug: Varenicline Tartrate Use twice daily as directed.   Ubrelvy 100 MG Tabs Generic drug: Ubrogepant Take 1 tablet (100 mg total) by mouth as needed. May repeat after 2 hours.  Maximum 2 tablets in 24 hours.         Signed: [name] II 06/02/2023, 3:47 PM

## 2023-06-03 ENCOUNTER — Other Ambulatory Visit: Payer: Self-pay

## 2023-06-03 ENCOUNTER — Other Ambulatory Visit (HOSPITAL_BASED_OUTPATIENT_CLINIC_OR_DEPARTMENT_OTHER): Payer: Self-pay

## 2023-06-04 ENCOUNTER — Other Ambulatory Visit (HOSPITAL_COMMUNITY): Payer: Self-pay

## 2023-06-06 ENCOUNTER — Other Ambulatory Visit: Payer: Self-pay | Admitting: Family Medicine

## 2023-06-06 ENCOUNTER — Other Ambulatory Visit: Payer: Self-pay

## 2023-06-06 ENCOUNTER — Other Ambulatory Visit (HOSPITAL_COMMUNITY): Payer: Self-pay

## 2023-06-06 MED ORDER — TYRVAYA 0.03 MG/ACT NA SOLN
NASAL | 3 refills | Status: AC
  Filled 2023-06-06 (×2): qty 25.2, 90d supply, fill #0
  Filled 2023-08-15 – 2023-08-17 (×2): qty 25.2, 90d supply, fill #1
  Filled 2023-11-02 – 2023-11-22 (×2): qty 25.2, 90d supply, fill #2
  Filled 2024-02-14: qty 25.2, 90d supply, fill #3

## 2023-06-07 ENCOUNTER — Other Ambulatory Visit: Payer: Self-pay

## 2023-06-07 ENCOUNTER — Other Ambulatory Visit (HOSPITAL_COMMUNITY): Payer: Self-pay

## 2023-06-07 ENCOUNTER — Other Ambulatory Visit (HOSPITAL_BASED_OUTPATIENT_CLINIC_OR_DEPARTMENT_OTHER): Payer: Self-pay

## 2023-06-07 MED ORDER — CYANOCOBALAMIN 1000 MCG/ML IJ SOLN
1000.0000 ug | INTRAMUSCULAR | 4 refills | Status: AC
  Filled 2023-06-07 – 2023-07-07 (×2): qty 3, 90d supply, fill #0
  Filled 2023-09-25: qty 3, 90d supply, fill #1
  Filled 2023-12-26: qty 3, 90d supply, fill #2
  Filled 2024-03-26: qty 3, 90d supply, fill #3

## 2023-06-08 ENCOUNTER — Other Ambulatory Visit: Payer: Self-pay

## 2023-06-09 ENCOUNTER — Other Ambulatory Visit (HOSPITAL_COMMUNITY): Payer: Self-pay

## 2023-06-10 ENCOUNTER — Other Ambulatory Visit: Payer: Self-pay

## 2023-06-11 ENCOUNTER — Other Ambulatory Visit (HOSPITAL_COMMUNITY): Payer: Self-pay

## 2023-06-15 ENCOUNTER — Other Ambulatory Visit (HOSPITAL_COMMUNITY): Payer: Self-pay

## 2023-06-15 MED ORDER — TYRVAYA 0.03 MG/ACT NA SOLN
NASAL | 3 refills | Status: DC
Start: 2023-06-02 — End: 2023-07-29

## 2023-06-16 DIAGNOSIS — Z4889 Encounter for other specified surgical aftercare: Secondary | ICD-10-CM | POA: Diagnosis not present

## 2023-06-16 DIAGNOSIS — R309 Painful micturition, unspecified: Secondary | ICD-10-CM | POA: Diagnosis not present

## 2023-06-17 ENCOUNTER — Other Ambulatory Visit (HOSPITAL_COMMUNITY): Payer: Self-pay

## 2023-06-17 MED ORDER — NITROFURANTOIN MONOHYD MACRO 100 MG PO CAPS: 100.0000 mg | ORAL_CAPSULE | Freq: Two times a day (BID) | ORAL | 0 refills | Status: DC

## 2023-06-17 NOTE — Progress Notes (Signed)
 NEUROLOGY FOLLOW UP OFFICE NOTE  [name] [id number]  Assessment/Plan:   Hemiplegic migraine.  TIA felt to be less likely.  She does have stroke risk factors such as high cholesterol and diabetes, but based on age, associated headache and normal MRI without any evidence of chronic microvascular ischemic changes, migraine more likely Migraine without aura, without status migrainosus, not intractable Diabetic polyneuropathy   Migraine prevention:  Emgality  Migraine rescue:  Ubrelvy  100mg .  Triptans are contraindicated. Neuropathic pain: Transition from amitriptyline  to duloxetine 60mg  daily; gabapentin  to 800mg  three times daily Limit use of pain relievers to no more than 2 days out of week to prevent risk of rebound or medication-overuse headache. Keep headache diary Follow up 6 months       Subjective:  [name] is a 53 year old right-handed female with DM II, hypercholesterolemia, migraines and anxiety who follows up for migraines and neuropathy.  CT and MRI of brain personally reviewed.  UPDATE: Migraines: Started Emgality .  Overall doing better. After each shot, she will get a headache.   Intensity:  severe Duration:  2 hours with Ubrelvy  Frequency:  1 to 2 a month.  On 1/18, she had an intractable headache.  Persisted despite Ubrelvy  and on 1/22 she had a syncopal episode and fell.Aaron Aas  She went to the ED at [facility].  Found to be tachycardic and labs revealed AKI. CT head and follow up MRI of brain with and without contrast were unremarkable and showed no acute intracranial abnormality.  Syncopal spell believed to be due to dehydration and she was treated with IVF.  Headache treated with headache cocktail and headache improved.    Diabetic neuropathy: Taking gabapentin  800mg  three times daily, amitriptyline  100mg  at bedtime, lidocaine -prilocaine  cream  Still needs to use the lidocaine  cream every night.     Current NSAIDS/analgesics:  ASA 81mg  daily,  oxycodone  Current triptans:  rizatriptan  Current ergotamine:  none Current anti-emetic:  none Current muscle relaxants:  Flexeril  10mg  QD PRN, methocarbamol  500mg  PRN Current Antihypertensive medications:  none Current Antidepressant medications:  amitriptyline  100mg  QHS Current Anticonvulsant medications:  gabapentin  800mg  three times daily (pain) Current anti-CGRP:  Ubrelvy  100mg  Other medications:  alprazolam  PRN (sleep), rosuvastatin  10mg   HISTORY:  On 11/16/2021, she developed right sided facial droop, slurred speech, blurred vision and right sided upper and lower extremity numbness and weakness.  She also had severe pounding headache on top of her head.  She presented to [facility] where she was a code stroke and had an NIHSS of 5.  CT head showed no possible hypodensity in the posterior left temporal lobe, which was determined to be artifact.  She received TNK and was transferred to [facility].  Symptoms lasted no more than 2 to 3 hours.  CTA head and neck showed no LVO or hemodynamically significant stenosis.  MRI of brain showed no acute intracranial abnormality.  2D echo showed LVEF 65-70%.  LDL was 38 and Hgb A1c was 6.8.  Discharged on ASA 81mg  and Plavix  75mg  daily for 21 days followed by ASA 81mg  monotherapy.     She does have history of migraines which are manageable.  They are right sided associated with blurred vision and photophobia lasting 2 hours with rizatriptan  and occurs no more than 2 days a month.  Also has history of migraines - pounding in back of head or across the front with right ptosis and phonophobia.   She also has diabetic polyneuropathy since at  least 2019.  She endorses numbness and pain in the feet.  Labs were unremarkable, including B12 >2000, MMA 138, folate 10.3, B1 142.3, B6 6.1, negative IFE for M-spike, TSH 2.190, negative ANA, negative RF, sed rate 13, negative SSA/SSB antibodies, negative pan-ANCA panel, negative Lyme IgG/IgM, nonreactive  RPR, negative Hep C antibody, negative Celiac panel, negative heavy metals.  Hgb A1c increased in 2022 but has steadily declined from 7.5 to 6.8.     Past NSAIDS/analgesics:  naproxen , ibuprofen , diclofenac , indomethacin , acetaminophen  Past abortive triptans:  sumatriptan Past anti-emetic:  Zofran  Past antihypertensive medications:  metoprolol , losartan , valsartan  Past antidepressant medications:  sertraline , escitalopram        PAST MEDICAL HISTORY: Past Medical History:  Diagnosis Date   Allergy     Anxiety    Diabetes mellitus without complication (HCC)    Gout    History of migraine headaches    Hx of migraines    Hypertension not at goal 03/26/2016   IBS (irritable bowel syndrome)    Impaired fasting glucose     MEDICATIONS: Current Outpatient Medications on File Prior to Visit  Medication Sig Dispense Refill   acetaminophen  (TYLENOL ) 500 MG tablet Take 2 tablets (1,000 mg total) by mouth every 6 (six) hours as needed. 30 tablet 0   allopurinol  (ZYLOPRIM ) 300 MG tablet Take 1 tablet (300 mg total) by mouth 2 (two) times daily. 180 tablet 1   ALPRAZolam  (XANAX ) 1 MG tablet Take 1 tablet (1 mg total) by mouth 2 (two) times daily as needed. for anxiety 60 tablet 2   amitriptyline  (ELAVIL ) 100 MG tablet Take 1 tablet (100 mg total) by mouth at bedtime. 30 tablet 2   cyanocobalamin  (VITAMIN B12) 1000 MCG/ML injection Inject 1 mL into the muscle every 30 days. 3 mL 4   cyclobenzaprine  (FLEXERIL ) 10 MG tablet Take 1 tablet (10 mg total) by mouth daily as needed for muscle spasms. 90 tablet 0   EPINEPHrine  0.3 mg/0.3 mL IJ SOAJ injection Inject 0.3 mg into the muscle as needed for anaphylaxis. 2 each 1   famotidine  (PEPCID ) 40 MG tablet Take 1 tablet (40 mg total) by mouth at bedtime. 90 tablet 1   gabapentin  (NEURONTIN ) 800 MG tablet Take 1 tablet (800 mg total) by mouth 3 (three) times daily. 270 tablet 1   Galcanezumab -gnlm (EMGALITY ) 120 MG/ML SOAJ Inject 120 mg into the skin  every 28 (twenty-eight) days. 1.12 mL 11   glucose blood (FREESTYLE LITE) test strip Use twice daily as directed to check blood sugar 100 each 1   Iron , Ferrous Sulfate , 325 (65 Fe) MG TABS Take one tablet (325 mg) by mouth daily. 90 tablet 1   levocetirizine (XYZAL ) 5 MG tablet Take 1 tablet (5 mg total) by mouth daily as needed for allergies (Can take an etxra dose during flare ups). 180 tablet 1   lidocaine -prilocaine  (EMLA ) cream Apply to feet as needed for nerve pain 60 g 2   magic mouthwash (nystatin , hydrocortisone , diphenhydrAMINE ) suspension Swish and spit 5 MLs by mouth  3 times a day as needed 540 mL 2   metFORMIN  (GLUCOPHAGE ) 500 MG tablet Take 1 tablet (500 mg total) by mouth 2 (two) times daily with a meal. 180 tablet 3   methocarbamol  (ROBAXIN ) 500 MG tablet Take 1 tablet (500 mg total) by mouth every 6 (six) hours as needed for muscle spasms. 30 tablet 0   montelukast  (SINGULAIR ) 10 MG tablet Take 1 tablet (10 mg total) by mouth daily. 30 tablet 5  omega-3 acid ethyl esters (LOVAZA ) 1 g capsule Take 2 capsules (2 g total) by mouth 2 (two) times daily. 360 capsule 1   oxyCODONE  (OXY IR/ROXICODONE ) 5 MG immediate release tablet Take 1 tablet (5 mg total) by mouth every 6 (six) hours as needed for moderate pain (pain score 4-6). 20 tablet 0   pantoprazole  (PROTONIX ) 40 MG tablet Take 1 tablet (40 mg total) by mouth 2 (two) times daily before a meal. 180 tablet 1   Perfluorohexyloctane  (MIEBO ) 1.338 GM/ML SOLN Place 1 drop into both eyes up to 4 (four) times daily as directed. 3 mL 99   rosuvastatin  (CRESTOR ) 10 MG tablet Take 1 tablet (10 mg total) by mouth at bedtime. 90 tablet 1   tirzepatide  (MOUNJARO ) 15 MG/0.5ML Pen Inject 15 mg into the skin once a week. 6 mL 1   Ubrogepant  (UBRELVY ) 100 MG TABS Take 1 tablet (100 mg total) by mouth as needed. May repeat after 2 hours.  Maximum 2 tablets in 24 hours. 16 tablet 11   Varenicline  Tartrate (TYRVAYA ) 0.03 MG/ACT SOLN Use 2 times a day  as directed 8.4 mL 12   Varenicline  Tartrate (TYRVAYA ) 0.03 MG/ACT SOLN Use twice daily as directed. 25.2 mL 3   Varenicline  Tartrate (TYRVAYA ) 0.03 MG/ACT SOLN Use twice daily as directed 25.2 mL 3   [DISCONTINUED] cetirizine  (ZYRTEC ) 10 MG tablet Take 1 tablet (10 mg total) by mouth daily. 90 tablet 3   No current facility-administered medications on file prior to visit.    ALLERGIES: Allergies  Allergen Reactions   Omnicef  [Cefdinir ] Other (See Comments)    Patient states antibiotic does not work. Has tried multiple times.   Losartan  Rash    Broke out on face only; no allergic reaction    FAMILY HISTORY: Family History  Problem Relation Age of Onset   Diabetes Mother    Neuropathy Mother    Heart failure Mother    Hypertension Mother    Cancer Mother    Asthma Father    Atrial fibrillation Father    Hypertension Father    Colonic polyp Father       Objective:  Blood pressure 131/88, pulse 78, resp. rate 20, height 5\' 5"  (1.651 m), weight 183 lb (83 kg), SpO2 98%. General: No acute distress.  Patient appears well    Janne Members, DO  CC: Charlotta Cook, MD

## 2023-06-20 ENCOUNTER — Other Ambulatory Visit (HOSPITAL_COMMUNITY): Payer: Self-pay

## 2023-06-20 ENCOUNTER — Other Ambulatory Visit: Payer: Self-pay

## 2023-06-20 MED ORDER — AMPICILLIN 500 MG PO CAPS
500.0000 mg | ORAL_CAPSULE | Freq: Four times a day (QID) | ORAL | 0 refills | Status: DC
Start: 2023-06-20 — End: 2023-07-12
  Filled 2023-06-20: qty 20, 5d supply, fill #0

## 2023-06-21 ENCOUNTER — Other Ambulatory Visit: Payer: Self-pay | Admitting: Neurology

## 2023-06-21 ENCOUNTER — Other Ambulatory Visit (HOSPITAL_COMMUNITY): Payer: Self-pay

## 2023-06-22 ENCOUNTER — Other Ambulatory Visit (HOSPITAL_COMMUNITY): Payer: Self-pay

## 2023-06-22 ENCOUNTER — Other Ambulatory Visit: Payer: Self-pay

## 2023-06-22 MED ORDER — UBRELVY 100 MG PO TABS
1.0000 | ORAL_TABLET | ORAL | 0 refills | Status: DC | PRN
  Filled 2023-06-22: qty 16, 30d supply, fill #0

## 2023-06-24 ENCOUNTER — Ambulatory Visit: Payer: Commercial Managed Care - PPO | Admitting: Neurology

## 2023-06-24 ENCOUNTER — Other Ambulatory Visit (HOSPITAL_COMMUNITY): Payer: Self-pay

## 2023-06-27 ENCOUNTER — Ambulatory Visit: Admitting: Neurology

## 2023-06-27 ENCOUNTER — Encounter: Payer: Self-pay | Admitting: Neurology

## 2023-06-27 ENCOUNTER — Other Ambulatory Visit (HOSPITAL_COMMUNITY): Payer: Self-pay

## 2023-06-27 ENCOUNTER — Other Ambulatory Visit: Payer: Self-pay

## 2023-06-27 ENCOUNTER — Other Ambulatory Visit: Payer: Self-pay | Admitting: Nurse Practitioner

## 2023-06-27 VITALS — BP 131/88 | HR 78 | Resp 20 | Ht 65.0 in | Wt 183.0 lb

## 2023-06-27 DIAGNOSIS — G629 Polyneuropathy, unspecified: Secondary | ICD-10-CM

## 2023-06-27 DIAGNOSIS — E1142 Type 2 diabetes mellitus with diabetic polyneuropathy: Secondary | ICD-10-CM | POA: Diagnosis not present

## 2023-06-27 DIAGNOSIS — G43009 Migraine without aura, not intractable, without status migrainosus: Secondary | ICD-10-CM | POA: Diagnosis not present

## 2023-06-27 MED ORDER — AMITRIPTYLINE HCL 25 MG PO TABS
ORAL_TABLET | ORAL | 0 refills | Status: DC
  Filled 2023-06-27: qty 21, 14d supply, fill #0

## 2023-06-27 MED ORDER — DULOXETINE HCL 30 MG PO CPEP
ORAL_CAPSULE | ORAL | 0 refills | Status: DC
  Filled 2023-06-27: qty 53, 30d supply, fill #0
  Filled 2023-07-26: qty 53, 30d supply, fill #1

## 2023-06-27 NOTE — Patient Instructions (Signed)
 Will transition from amitriptyline  to duloxetine: Decrease amitriptyline  to 50mg  at bedtime for one week, then 25mg  at bedtime for one week, then STOP When you stop amitriptyline , start duloxetine 30mg  daily for one week, then increase to 60mg  daily Continue gabapentin  800mg  three times daily Continue Emgality  every 4 weeks Take Ubrelvy  as needed.

## 2023-06-28 ENCOUNTER — Other Ambulatory Visit (HOSPITAL_COMMUNITY): Payer: Self-pay

## 2023-06-28 ENCOUNTER — Other Ambulatory Visit: Payer: Self-pay

## 2023-06-28 MED ORDER — CYCLOBENZAPRINE HCL 10 MG PO TABS
10.0000 mg | ORAL_TABLET | Freq: Every day | ORAL | 0 refills | Status: DC | PRN
  Filled 2023-06-28: qty 90, 90d supply, fill #0

## 2023-07-04 ENCOUNTER — Other Ambulatory Visit (HOSPITAL_COMMUNITY): Payer: Self-pay

## 2023-07-08 ENCOUNTER — Other Ambulatory Visit: Payer: Self-pay

## 2023-07-08 ENCOUNTER — Other Ambulatory Visit (HOSPITAL_COMMUNITY): Payer: Self-pay

## 2023-07-12 DIAGNOSIS — Z1231 Encounter for screening mammogram for malignant neoplasm of breast: Secondary | ICD-10-CM | POA: Diagnosis not present

## 2023-07-12 DIAGNOSIS — Z4889 Encounter for other specified surgical aftercare: Secondary | ICD-10-CM | POA: Diagnosis not present

## 2023-07-15 ENCOUNTER — Other Ambulatory Visit (HOSPITAL_COMMUNITY): Payer: Self-pay

## 2023-07-19 ENCOUNTER — Other Ambulatory Visit: Payer: Self-pay

## 2023-07-19 ENCOUNTER — Other Ambulatory Visit (HOSPITAL_COMMUNITY): Payer: Self-pay

## 2023-07-21 ENCOUNTER — Encounter (HOSPITAL_COMMUNITY): Payer: Self-pay

## 2023-07-22 ENCOUNTER — Encounter: Payer: Self-pay | Admitting: Nurse Practitioner

## 2023-07-22 ENCOUNTER — Other Ambulatory Visit (HOSPITAL_COMMUNITY): Payer: Self-pay

## 2023-07-25 ENCOUNTER — Encounter: Payer: Self-pay | Admitting: Neurology

## 2023-07-26 ENCOUNTER — Other Ambulatory Visit: Payer: Self-pay | Admitting: Neurology

## 2023-07-26 ENCOUNTER — Other Ambulatory Visit: Payer: Self-pay | Admitting: Family Medicine

## 2023-07-26 ENCOUNTER — Telehealth: Payer: Self-pay | Admitting: Neurology

## 2023-07-26 NOTE — Telephone Encounter (Signed)
 Maitland called back in to let sheena/jaffe know the person in charge of [employer] [facility]) said they are not a doctor so they can't put dates. She said mary will be faxing over a blank sheet of paper and jaffe has to put in the dates HE wants.  She applied 07/25/23. If you have questions call her back

## 2023-07-26 NOTE — Telephone Encounter (Signed)
 Patient sent a Mychart message about her Headaches and the FMLA.  Patient needs to speak to someone about the FMLA paper work that should be coming to our office. She is trying to get it approved for one year. The intake number is [id number] for the FMLA. She states that she spoke with them today and they have the date 07-25-23 to 07-25-23 and not for  year

## 2023-07-26 NOTE — Telephone Encounter (Signed)
 Please have [employer] fix the date before we get it.

## 2023-07-27 ENCOUNTER — Other Ambulatory Visit: Payer: Self-pay

## 2023-07-27 ENCOUNTER — Other Ambulatory Visit (HOSPITAL_COMMUNITY): Payer: Self-pay

## 2023-07-27 ENCOUNTER — Other Ambulatory Visit: Payer: Self-pay | Admitting: Neurology

## 2023-07-27 MED ORDER — DULOXETINE HCL 30 MG PO CPEP
60.0000 mg | ORAL_CAPSULE | Freq: Every day | ORAL | 5 refills | Status: DC
  Filled 2023-07-27: qty 60, 30d supply, fill #0

## 2023-07-27 MED ORDER — GABAPENTIN 800 MG PO TABS
800.0000 mg | ORAL_TABLET | Freq: Three times a day (TID) | ORAL | 1 refills | Status: DC
  Filled 2023-07-27 – 2023-08-01 (×2): qty 270, 90d supply, fill #0
  Filled 2023-09-25 – 2023-11-02 (×2): qty 270, 90d supply, fill #1

## 2023-07-27 MED ORDER — IRON (FERROUS SULFATE) 325 (65 FE) MG PO TABS
325.0000 mg | ORAL_TABLET | Freq: Every day | ORAL | 1 refills | Status: DC
  Filled 2023-07-27: qty 90, 90d supply, fill #0
  Filled 2023-11-02: qty 90, 90d supply, fill #1

## 2023-07-27 NOTE — Telephone Encounter (Signed)
 Pt called in wanting to follow up on her mychart message.

## 2023-07-27 NOTE — Telephone Encounter (Signed)
 On Dr.Jaffe desk.

## 2023-07-27 NOTE — Telephone Encounter (Signed)
 We received the pt's FMLA paperwork through the fax. Spoke with the patient. She would like them faxed to [employer] and a copy mailed to her once completed. Per Carron Clap will collect form fee after forms have been completed. Forms are in Dr. Sherilyn Dings box

## 2023-07-28 ENCOUNTER — Ambulatory Visit: Payer: Commercial Managed Care - PPO | Admitting: Neurology

## 2023-07-28 DIAGNOSIS — Z6829 Body mass index (BMI) 29.0-29.9, adult: Secondary | ICD-10-CM | POA: Diagnosis not present

## 2023-07-28 DIAGNOSIS — Z1272 Encounter for screening for malignant neoplasm of vagina: Secondary | ICD-10-CM | POA: Diagnosis not present

## 2023-07-28 DIAGNOSIS — Z9889 Other specified postprocedural states: Secondary | ICD-10-CM | POA: Insufficient documentation

## 2023-07-28 DIAGNOSIS — Z01419 Encounter for gynecological examination (general) (routine) without abnormal findings: Secondary | ICD-10-CM | POA: Diagnosis not present

## 2023-07-28 DIAGNOSIS — N952 Postmenopausal atrophic vaginitis: Secondary | ICD-10-CM | POA: Diagnosis not present

## 2023-07-29 ENCOUNTER — Other Ambulatory Visit (HOSPITAL_COMMUNITY): Payer: Self-pay

## 2023-07-29 ENCOUNTER — Encounter: Payer: Self-pay | Admitting: Neurology

## 2023-07-29 ENCOUNTER — Ambulatory Visit: Payer: Commercial Managed Care - PPO | Admitting: Nurse Practitioner

## 2023-07-29 ENCOUNTER — Other Ambulatory Visit: Payer: Self-pay | Admitting: Neurology

## 2023-07-29 ENCOUNTER — Other Ambulatory Visit: Payer: Self-pay

## 2023-07-29 VITALS — BP 116/74 | HR 111 | Temp 99.1°F | Ht 65.0 in | Wt 177.2 lb

## 2023-07-29 DIAGNOSIS — F419 Anxiety disorder, unspecified: Secondary | ICD-10-CM | POA: Diagnosis not present

## 2023-07-29 DIAGNOSIS — E1140 Type 2 diabetes mellitus with diabetic neuropathy, unspecified: Secondary | ICD-10-CM | POA: Diagnosis not present

## 2023-07-29 DIAGNOSIS — G629 Polyneuropathy, unspecified: Secondary | ICD-10-CM

## 2023-07-29 DIAGNOSIS — M1A0720 Idiopathic chronic gout, left ankle and foot, without tophus (tophi): Secondary | ICD-10-CM | POA: Diagnosis not present

## 2023-07-29 DIAGNOSIS — E782 Mixed hyperlipidemia: Secondary | ICD-10-CM

## 2023-07-29 MED ORDER — EMGALITY 120 MG/ML ~~LOC~~ SOAJ
120.0000 mg | SUBCUTANEOUS | 1 refills | Status: DC
  Filled 2023-07-29: qty 3, 90d supply, fill #0
  Filled 2023-08-15: qty 3, 84d supply, fill #0
  Filled 2023-11-02: qty 3, 84d supply, fill #1

## 2023-07-29 MED ORDER — METFORMIN HCL 500 MG PO TABS
500.0000 mg | ORAL_TABLET | Freq: Two times a day (BID) | ORAL | 1 refills | Status: DC
  Filled 2023-07-29 – 2023-09-25 (×2): qty 180, 90d supply, fill #0
  Filled 2023-12-26: qty 180, 90d supply, fill #1

## 2023-07-29 MED ORDER — UBRELVY 100 MG PO TABS
1.0000 | ORAL_TABLET | ORAL | 5 refills | Status: DC | PRN
  Filled 2023-07-29: qty 16, 30d supply, fill #0
  Filled 2023-11-29: qty 16, 30d supply, fill #1

## 2023-07-29 MED ORDER — OMEGA-3-ACID ETHYL ESTERS 1 G PO CAPS
2.0000 | ORAL_CAPSULE | Freq: Two times a day (BID) | ORAL | 1 refills | Status: DC
  Filled 2023-07-29 – 2023-08-15 (×2): qty 360, 90d supply, fill #0
  Filled 2023-09-25 – 2023-11-02 (×2): qty 360, 90d supply, fill #1

## 2023-07-29 MED ORDER — ALPRAZOLAM 1 MG PO TABS: 1.0000 mg | ORAL_TABLET | Freq: Two times a day (BID) | ORAL | 2 refills | Status: DC | PRN

## 2023-07-29 MED ORDER — DULOXETINE HCL 60 MG PO CPEP
60.0000 mg | ORAL_CAPSULE | Freq: Every day | ORAL | 5 refills | Status: DC
  Filled 2023-07-29: qty 30, 30d supply, fill #0
  Filled 2023-09-04: qty 30, 30d supply, fill #1

## 2023-07-29 MED ORDER — MOUNJARO 15 MG/0.5ML ~~LOC~~ SOAJ
15.0000 mg | SUBCUTANEOUS | 1 refills | Status: DC
  Filled 2023-07-29 – 2023-10-03 (×3): qty 6, 84d supply, fill #0
  Filled 2023-11-29 – 2023-12-26 (×2): qty 6, 84d supply, fill #1

## 2023-07-31 ENCOUNTER — Encounter: Payer: Self-pay | Admitting: Nurse Practitioner

## 2023-08-01 ENCOUNTER — Other Ambulatory Visit: Payer: Self-pay

## 2023-08-01 ENCOUNTER — Other Ambulatory Visit (HOSPITAL_COMMUNITY): Payer: Self-pay

## 2023-08-01 NOTE — Progress Notes (Signed)
 Subjective:    Patient ID: [name], female    DOB: [birth year], 53 y.o.   MRN: [id number]  HPI Presents for routine follow-up of her chronic health issues.  Has been doing well on Mounjaro , sugars doing well and has had weight loss.  Continues to lose weight around the abdominal area.  Patient is doing much better emotionally, the cause of her significant stress at previous visits has resolved.  Patient is taking more time for herself and improve stress reduction.  Adherent to medication regimen.  Is currently trying to wean off her Xanax  with a goal of completely stopping it at some point now that her stress has improved.  Gout is stable on allopurinol .   Review of Systems  Constitutional:  Positive for fatigue.       Fatigue significantly improved.  Respiratory:  Negative for cough, chest tightness, shortness of breath and wheezing.        Although patient has a baseline of elevated heart rate, denies any adverse symptoms including a feeling of palpitations.  Cardiovascular:  Negative for chest pain and palpitations.      07/29/2023    1:25 PM  Depression screen PHQ 2/9  Decreased Interest 0  Down, Depressed, Hopeless 0  PHQ - 2 Score 0  Altered sleeping 0  Tired, decreased energy 0  Change in appetite 0  Feeling bad or failure about yourself  0  Trouble concentrating 0  Moving slowly or fidgety/restless 0  Suicidal thoughts 0  PHQ-9 Score 0      07/29/2023    1:25 PM 04/22/2023    1:14 PM 03/18/2023    9:31 AM 10/01/2022    2:02 PM  GAD 7 : Generalized Anxiety Score  Nervous, Anxious, on Edge 0 3 0 1  Control/stop worrying 0 3 1 2   Worry too much - different things 0 2 1 2   Trouble relaxing 0 0 0 0  Restless 0 2 0 0  Easily annoyed or irritable 0 2 1 0  Afraid - awful might happen 0 2 0 0  Total GAD 7 Score 0 14 3 5   Anxiety Difficulty  Somewhat difficult Not difficult at all          Objective:   Physical Exam Vitals and nursing note reviewed.  Constitutional:       General: She is not in acute distress.    Appearance: Normal appearance.  Cardiovascular:     Rate and Rhythm: Normal rate and regular rhythm.     Heart sounds: Normal heart sounds. No murmur heard.    No gallop.  Pulmonary:     Effort: Pulmonary effort is normal.     Breath sounds: Normal breath sounds.  Neurological:     Mental Status: She is alert.  Psychiatric:        Mood and Affect: Mood normal.        Behavior: Behavior normal.        Thought Content: Thought content normal.        Judgment: Judgment normal.   Calm very cheerful affect.  Much improved from previous visits. Today's Vitals   07/29/23 1312  BP: 116/74  Pulse: (!) 111  Temp: 99.1 F (37.3 C)  SpO2: 99%  Weight: 177 lb 3.2 oz (80.4 kg)  Height: 5\' 5"  (1.651 m)   Body mass index is 29.49 kg/m. 05/31/2023 hemoglobin A1c 6.1%. Labs up-to-date see chart.       Assessment & Plan:   Problem  List Items Addressed This Visit       Endocrine   Type 2 diabetes mellitus (HCC) - Primary   Relevant Medications   metFORMIN  (GLUCOPHAGE ) 500 MG tablet   tirzepatide  (MOUNJARO ) 15 MG/0.5ML Pen     Nervous and Auditory   Peripheral polyneuropathy   Relevant Medications   ALPRAZolam  (XANAX ) 1 MG tablet     Musculoskeletal and Integument   Chronic idiopathic gout involving toe of left foot without tophus     Other   Anxiety   Relevant Medications   ALPRAZolam  (XANAX ) 1 MG tablet   Mixed hyperlipidemia   Relevant Medications   omega-3 acid ethyl esters (LOVAZA ) 1 g capsule   Meds ordered this encounter  Medications   ALPRAZolam  (XANAX ) 1 MG tablet    Sig: Take 1 tablet (1 mg total) by mouth 2 (two) times daily as needed. for anxiety    Dispense:  60 tablet    Refill:  2    Supervising Provider:   Charlotta Cook A [[id number]]   metFORMIN  (GLUCOPHAGE ) 500 MG tablet    Sig: Take 1 tablet (500 mg total) by mouth 2 (two) times daily with a meal.    Dispense:  180 tablet    Refill:  1    Supervising  Provider:   Charlotta Cook A [[id number]]   omega-3 acid ethyl esters (LOVAZA ) 1 g capsule    Sig: Take 2 capsules (2 g total) by mouth 2 (two) times daily.    Dispense:  360 capsule    Refill:  1    Supervising Provider:   Charlotta Cook A [[id number]]   tirzepatide  (MOUNJARO ) 15 MG/0.5ML Pen    Sig: Inject 15 mg into the skin once a week.    Dispense:  6 mL    Refill:  1    Diagnosis: Type 2 diabetes E11.42 Currently taking Metformin  1000 mg BID    Supervising Provider:   Charlotta Cook A [[id number]]   Continue current medication regimen as directed. Recommend slowly weaning off Xanax  over time and then use only for extreme anxiety.  Patient agrees with this plan. Continue healthy diet, regular activity and weight loss efforts. Gets regular preventive health physicals with gynecology. Return in about 6 months (around 01/28/2024).

## 2023-08-03 ENCOUNTER — Encounter: Payer: Self-pay | Admitting: Neurology

## 2023-08-04 DIAGNOSIS — Z0279 Encounter for issue of other medical certificate: Secondary | ICD-10-CM

## 2023-08-04 NOTE — Telephone Encounter (Signed)
 Patient called back and wants sheena to mail her a copy of the [employer] paperwork

## 2023-08-05 ENCOUNTER — Encounter: Payer: Self-pay | Admitting: Family Medicine

## 2023-08-05 ENCOUNTER — Other Ambulatory Visit: Payer: Self-pay

## 2023-08-05 ENCOUNTER — Ambulatory Visit: Admitting: Family Medicine

## 2023-08-05 VITALS — BP 117/81 | HR 108 | Ht 65.0 in | Wt 179.0 lb

## 2023-08-05 DIAGNOSIS — E1140 Type 2 diabetes mellitus with diabetic neuropathy, unspecified: Secondary | ICD-10-CM

## 2023-08-05 DIAGNOSIS — E1142 Type 2 diabetes mellitus with diabetic polyneuropathy: Secondary | ICD-10-CM | POA: Diagnosis not present

## 2023-08-05 DIAGNOSIS — R5383 Other fatigue: Secondary | ICD-10-CM

## 2023-08-05 DIAGNOSIS — E782 Mixed hyperlipidemia: Secondary | ICD-10-CM | POA: Diagnosis not present

## 2023-08-05 DIAGNOSIS — Z79899 Other long term (current) drug therapy: Secondary | ICD-10-CM

## 2023-08-05 MED ORDER — FREESTYLE LITE TEST VI STRP: ORAL_STRIP | 5 refills | Status: DC

## 2023-08-05 MED ORDER — DESONIDE 0.05 % EX CREA
TOPICAL_CREAM | CUTANEOUS | 1 refills | Status: DC
Start: 2023-08-05 — End: 2023-12-19

## 2023-08-05 NOTE — Progress Notes (Unsigned)
   Subjective:    Patient ID: [name], female    DOB: [birth year], 53 y.o.   MRN: [id number]  HPI Facial irritation couple of weeks now spreading to brow and hands  Some itching  Review of Systems     Objective:   Physical Exam        Assessment & Plan:

## 2023-08-05 NOTE — Progress Notes (Unsigned)
   Subjective:    Patient ID: [name], female    DOB: Dec 09, 1970, 53 y.o.   MRN: [id number]  HPI Patient with a red rash some on her legs and also small amount on the arm also small amount on the right side of the face itches No fevers chills no sign of cellulitis Has underlying diabetes  Review of Systems     Objective:   Physical Exam  What appears to be possible contact dermatitis Due to diabetes we will treat this with topical steroid Follow-up if any ongoing troubles      Assessment & Plan:   Topical steroid Twice daily as needed for 7 to 10 days if that does not take care of it we can help set up dermatology Continue current diabetes care Follow-up lab work as well as visit with Orelia Binet later in December

## 2023-08-08 ENCOUNTER — Other Ambulatory Visit: Payer: Self-pay

## 2023-08-08 MED ORDER — FREESTYLE LIBRE 3 SENSOR MISC: 3 refills | Status: DC

## 2023-08-09 ENCOUNTER — Other Ambulatory Visit: Payer: Self-pay

## 2023-08-09 MED ORDER — FREESTYLE LIBRE 3 PLUS SENSOR MISC: 6 refills | Status: AC

## 2023-08-15 ENCOUNTER — Other Ambulatory Visit: Payer: Self-pay

## 2023-08-15 ENCOUNTER — Other Ambulatory Visit (HOSPITAL_COMMUNITY): Payer: Self-pay

## 2023-08-15 ENCOUNTER — Ambulatory Visit: Payer: Commercial Managed Care - PPO | Admitting: Neurology

## 2023-08-16 ENCOUNTER — Other Ambulatory Visit: Payer: Self-pay

## 2023-08-17 ENCOUNTER — Other Ambulatory Visit: Payer: Self-pay

## 2023-08-18 ENCOUNTER — Other Ambulatory Visit: Payer: Self-pay

## 2023-08-18 ENCOUNTER — Other Ambulatory Visit (HOSPITAL_COMMUNITY): Payer: Self-pay

## 2023-08-18 ENCOUNTER — Telehealth: Payer: Self-pay

## 2023-08-18 NOTE — Telephone Encounter (Signed)
 Pharmacy Patient Advocate Encounter   Received notification from CoverMyMeds that prior authorization for Emgality  120MG /ML auto-injectors (migraine) is required/requested.   Insurance verification completed.   The patient is insured through [facility] .   Per test claim: PA required; PA submitted to above mentioned insurance via CoverMyMeds Key/confirmation #/EOC [id number] Status is pending

## 2023-08-24 ENCOUNTER — Other Ambulatory Visit (HOSPITAL_COMMUNITY): Payer: Self-pay

## 2023-08-24 NOTE — Telephone Encounter (Signed)
 Pharmacy Patient Advocate Encounter  Received notification from [employer] that Prior Authorization for EMGALITY  120MG  has been APPROVED from 6.30.25 to 6.29.26. Ran test claim, Copay is $35. This test claim was processed through [facility]- copay amounts may vary at other pharmacies due to pharmacy/plan contracts, or as the patient moves through the different stages of their insurance plan.   PA #/Case ID/Reference #: [id number]

## 2023-09-04 ENCOUNTER — Other Ambulatory Visit: Payer: Self-pay | Admitting: Family Medicine

## 2023-09-04 ENCOUNTER — Other Ambulatory Visit: Payer: Self-pay | Admitting: Allergy & Immunology

## 2023-09-04 DIAGNOSIS — E782 Mixed hyperlipidemia: Secondary | ICD-10-CM

## 2023-09-05 ENCOUNTER — Other Ambulatory Visit: Payer: Self-pay

## 2023-09-05 ENCOUNTER — Other Ambulatory Visit (HOSPITAL_COMMUNITY): Payer: Self-pay

## 2023-09-05 DIAGNOSIS — E782 Mixed hyperlipidemia: Secondary | ICD-10-CM

## 2023-09-05 MED ORDER — LEVOCETIRIZINE DIHYDROCHLORIDE 5 MG PO TABS
5.0000 mg | ORAL_TABLET | Freq: Every day | ORAL | 0 refills | Status: DC | PRN
  Filled 2023-09-05: qty 90, 90d supply, fill #0

## 2023-09-05 MED ORDER — ROSUVASTATIN CALCIUM 10 MG PO TABS
10.0000 mg | ORAL_TABLET | Freq: Every day | ORAL | 1 refills | Status: DC
  Filled 2023-09-05: qty 90, 90d supply, fill #0
  Filled 2023-11-29: qty 90, 90d supply, fill #1

## 2023-09-06 ENCOUNTER — Telehealth: Payer: Self-pay

## 2023-09-06 ENCOUNTER — Other Ambulatory Visit (HOSPITAL_COMMUNITY): Payer: Self-pay

## 2023-09-06 ENCOUNTER — Telehealth: Payer: Self-pay | Admitting: Pharmacist

## 2023-09-06 NOTE — Telephone Encounter (Signed)
 Pharmacy Patient Advocate Encounter  Received notification from MED[employer] that Prior Authorization for UBRELVY  100MG  has been APPROVED from 7.15.25 to 7.15.26. Ran test claim, Copay is $0. This test claim was processed through [facility]- copay amounts may vary at other pharmacies due to pharmacy/plan contracts, or as the patient moves through the different stages of their insurance plan.   PA #/Case ID/Reference #: [id number]

## 2023-09-06 NOTE — Telephone Encounter (Signed)
 Pharmacy Patient Advocate Encounter   Received notification from CoverMyMeds that prior authorization for Ubrelvy  100MG  tablets is required/requested.   Insurance verification completed.   The patient is insured through [facility] .   Per test claim: PA required; PA submitted to above mentioned insurance via CoverMyMeds Key/confirmation #/EOC [id number] Status is pending

## 2023-09-06 NOTE — Telephone Encounter (Signed)
 Patient dropped off document DMV, to be filled out by provider. Patient requested to send it back via Call Patient to pick up within 7-days. Document is located in providers tray at front office.Please advise at Mobile [phone] (mobile)

## 2023-09-16 DIAGNOSIS — E1142 Type 2 diabetes mellitus with diabetic polyneuropathy: Secondary | ICD-10-CM | POA: Diagnosis not present

## 2023-09-16 DIAGNOSIS — R5383 Other fatigue: Secondary | ICD-10-CM | POA: Diagnosis not present

## 2023-09-16 DIAGNOSIS — Z79899 Other long term (current) drug therapy: Secondary | ICD-10-CM | POA: Diagnosis not present

## 2023-09-16 DIAGNOSIS — E782 Mixed hyperlipidemia: Secondary | ICD-10-CM | POA: Diagnosis not present

## 2023-09-17 ENCOUNTER — Ambulatory Visit: Payer: Self-pay | Admitting: Family Medicine

## 2023-09-17 LAB — BASIC METABOLIC PANEL WITH GFR
BUN/Creatinine Ratio: 7 — ABNORMAL LOW (ref 9–23)
BUN: 5 mg/dL — ABNORMAL LOW (ref 6–24)
CO2: 25 mmol/L (ref 20–29)
Calcium: 9.6 mg/dL (ref 8.7–10.2)
Chloride: 101 mmol/L (ref 96–106)
Creatinine, Ser: 0.68 mg/dL (ref 0.57–1.00)
Glucose: 127 mg/dL — ABNORMAL HIGH (ref 70–99)
Potassium: 3.9 mmol/L (ref 3.5–5.2)
Sodium: 141 mmol/L (ref 134–144)
eGFR: 104 mL/min/1.73 (ref >59)

## 2023-09-17 LAB — HEPATIC FUNCTION PANEL
ALT: 26 IU/L (ref 0–32)
AST: 31 IU/L (ref 0–40)
Albumin: 4.4 g/dL (ref 3.8–4.9)
Alkaline Phosphatase: 64 IU/L (ref 44–121)
Bilirubin Total: 0.3 mg/dL (ref 0.0–1.2)
Bilirubin, Direct: 0.13 mg/dL (ref 0.00–0.40)
Total Protein: 6.5 g/dL (ref 6.0–8.5)

## 2023-09-17 LAB — LIPID PANEL
Chol/HDL Ratio: 3.1 ratio (ref 0.0–4.4)
Cholesterol, Total: 111 mg/dL (ref 100–199)
HDL: 36 mg/dL — ABNORMAL LOW (ref >39)
LDL Chol Calc (NIH): 57 mg/dL (ref 0–99)
Triglycerides: 96 mg/dL (ref 0–149)
VLDL Cholesterol Cal: 18 mg/dL (ref 5–40)

## 2023-09-17 LAB — TSH+FREE T4
Free T4: 1.14 ng/dL (ref 0.82–1.77)
TSH: 0.947 u[IU]/mL (ref 0.450–4.500)

## 2023-09-17 LAB — HEMOGLOBIN A1C
Est. average glucose Bld gHb Est-mCnc: 140 mg/dL
Hgb A1c MFr Bld: 6.5 % — ABNORMAL HIGH (ref 4.8–5.6)

## 2023-09-25 ENCOUNTER — Other Ambulatory Visit: Payer: Self-pay | Admitting: Family Medicine

## 2023-09-25 DIAGNOSIS — G629 Polyneuropathy, unspecified: Secondary | ICD-10-CM

## 2023-09-26 ENCOUNTER — Ambulatory Visit: Admitting: Orthopedic Surgery

## 2023-09-26 ENCOUNTER — Other Ambulatory Visit: Payer: Self-pay

## 2023-09-26 ENCOUNTER — Encounter: Payer: Self-pay | Admitting: Orthopedic Surgery

## 2023-09-26 ENCOUNTER — Other Ambulatory Visit (INDEPENDENT_AMBULATORY_CARE_PROVIDER_SITE_OTHER)

## 2023-09-26 ENCOUNTER — Other Ambulatory Visit (HOSPITAL_COMMUNITY): Payer: Self-pay

## 2023-09-26 VITALS — BP 104/78 | HR 120

## 2023-09-26 DIAGNOSIS — M25561 Pain in right knee: Secondary | ICD-10-CM

## 2023-09-26 MED ORDER — LIDOCAINE-PRILOCAINE 2.5-2.5 % EX CREA
TOPICAL_CREAM | CUTANEOUS | 2 refills | Status: DC
  Filled 2023-09-26: qty 60, 60d supply, fill #0
  Filled 2023-10-03: qty 60, 30d supply, fill #0
  Filled 2023-11-02: qty 60, 30d supply, fill #1
  Filled 2023-11-22 – 2023-12-08 (×2): qty 60, 30d supply, fill #2

## 2023-09-26 NOTE — Progress Notes (Signed)
 Chief Complaint  Patient presents with   Knee Pain    Right knee pain   53 year old female felt a popping sensation right knee 3 weeks ago.  The pain has worsened over the 3 weeks.  She notices a large amount of swelling in the right knee and pain along the lateral side  She did not take any medication and did not put any ice on the knee  Medical history hypertension, diabetes, migraine headache, no orthopedic surgeries to date  Patient does not take anticoagulants  Medication list does include gabapentin  metformin  Crestor   BP 104/78   Pulse (!) 120 last recorded weight 179  The exam is positive for lateral joint line tenderness moderate effusion and increased pain with McMurray's for lateral meniscus.  Ligaments were stable muscle tone was normal neurovascular exam was intact  DG Knee AP/LAT W/Sunrise Right Result Date: 09/26/2023 X-ray right knee acute pain patient felt a pop and has lateral joint pain X-ray shows valgus alignment to right and left knee, there is questionable narrowing of the lateral compartment She would be best qualified as grade 1 knee arthritis.  No osteophytes.  No secondary bone changes. Impression grade 1 OA     Assessment and plan Acute pain with effusion right knee questionable lateral meniscus tear  Start ice and elevation and ibuprofen   Recheck on Thursday

## 2023-09-26 NOTE — Patient Instructions (Signed)
 Apply ice elevate when not walking take ibuprofen  800 mg 3 times a day

## 2023-09-27 ENCOUNTER — Other Ambulatory Visit: Payer: Self-pay

## 2023-09-27 ENCOUNTER — Other Ambulatory Visit (HOSPITAL_COMMUNITY): Payer: Self-pay

## 2023-09-28 ENCOUNTER — Other Ambulatory Visit (HOSPITAL_COMMUNITY): Payer: Self-pay

## 2023-09-28 ENCOUNTER — Other Ambulatory Visit: Payer: Self-pay | Admitting: Neurology

## 2023-09-28 ENCOUNTER — Encounter: Payer: Self-pay | Admitting: Neurology

## 2023-09-28 MED ORDER — DULOXETINE HCL 60 MG PO CPEP
60.0000 mg | ORAL_CAPSULE | Freq: Every day | ORAL | 1 refills | Status: DC
  Filled 2023-09-28 – 2023-10-04 (×2): qty 90, 90d supply, fill #0

## 2023-10-03 ENCOUNTER — Other Ambulatory Visit (HOSPITAL_COMMUNITY): Payer: Self-pay

## 2023-10-05 ENCOUNTER — Other Ambulatory Visit: Payer: Self-pay

## 2023-10-05 ENCOUNTER — Other Ambulatory Visit (HOSPITAL_COMMUNITY): Payer: Self-pay

## 2023-10-11 ENCOUNTER — Other Ambulatory Visit (HOSPITAL_COMMUNITY): Payer: Self-pay

## 2023-10-12 ENCOUNTER — Other Ambulatory Visit (HOSPITAL_COMMUNITY): Payer: Self-pay

## 2023-10-13 ENCOUNTER — Other Ambulatory Visit: Payer: Self-pay

## 2023-10-13 ENCOUNTER — Other Ambulatory Visit (HOSPITAL_COMMUNITY): Payer: Self-pay

## 2023-10-25 ENCOUNTER — Other Ambulatory Visit (HOSPITAL_COMMUNITY): Payer: Self-pay

## 2023-10-28 ENCOUNTER — Other Ambulatory Visit (HOSPITAL_COMMUNITY): Payer: Self-pay

## 2023-10-28 ENCOUNTER — Other Ambulatory Visit: Payer: Self-pay

## 2023-10-29 ENCOUNTER — Other Ambulatory Visit (HOSPITAL_COMMUNITY): Payer: Self-pay

## 2023-10-31 ENCOUNTER — Other Ambulatory Visit (HOSPITAL_COMMUNITY): Payer: Self-pay

## 2023-10-31 ENCOUNTER — Other Ambulatory Visit: Payer: Self-pay

## 2023-11-02 ENCOUNTER — Other Ambulatory Visit: Payer: Self-pay | Admitting: Nurse Practitioner

## 2023-11-02 ENCOUNTER — Other Ambulatory Visit (HOSPITAL_COMMUNITY): Payer: Self-pay

## 2023-11-02 ENCOUNTER — Other Ambulatory Visit: Payer: Self-pay

## 2023-11-02 DIAGNOSIS — K219 Gastro-esophageal reflux disease without esophagitis: Secondary | ICD-10-CM

## 2023-11-02 MED ORDER — PANTOPRAZOLE SODIUM 40 MG PO TBEC
40.0000 mg | DELAYED_RELEASE_TABLET | Freq: Two times a day (BID) | ORAL | 1 refills | Status: AC
  Filled 2023-11-02: qty 180, 90d supply, fill #0
  Filled 2024-01-23: qty 180, 90d supply, fill #1

## 2023-11-02 MED ORDER — FAMOTIDINE 40 MG PO TABS
40.0000 mg | ORAL_TABLET | Freq: Every day | ORAL | 1 refills | Status: AC
  Filled 2023-11-02: qty 90, 90d supply, fill #0
  Filled 2024-01-23: qty 90, 90d supply, fill #1

## 2023-11-11 ENCOUNTER — Ambulatory Visit: Admitting: Nurse Practitioner

## 2023-11-11 ENCOUNTER — Encounter: Payer: Self-pay | Admitting: Nurse Practitioner

## 2023-11-11 VITALS — BP 121/80 | HR 107 | Temp 97.9°F | Ht 65.0 in | Wt 171.2 lb

## 2023-11-11 DIAGNOSIS — R300 Dysuria: Secondary | ICD-10-CM | POA: Diagnosis not present

## 2023-11-11 DIAGNOSIS — K219 Gastro-esophageal reflux disease without esophagitis: Secondary | ICD-10-CM

## 2023-11-11 DIAGNOSIS — K581 Irritable bowel syndrome with constipation: Secondary | ICD-10-CM | POA: Diagnosis not present

## 2023-11-11 DIAGNOSIS — F419 Anxiety disorder, unspecified: Secondary | ICD-10-CM | POA: Diagnosis not present

## 2023-11-11 DIAGNOSIS — F5104 Psychophysiologic insomnia: Secondary | ICD-10-CM

## 2023-11-11 LAB — POCT URINALYSIS DIP (CLINITEK)

## 2023-11-12 ENCOUNTER — Encounter: Payer: Self-pay | Admitting: Nurse Practitioner

## 2023-11-12 MED ORDER — ALPRAZOLAM 1 MG PO TABS: 1.0000 mg | ORAL_TABLET | Freq: Two times a day (BID) | ORAL | 0 refills | Status: DC | PRN

## 2023-11-12 NOTE — Progress Notes (Signed)
 Subjective:    Patient ID: [name], female    DOB: [birth year], 53 y.o.   MRN: [id number]  HPI Discussed the use of AI scribe software for clinical note transcription with the patient, who gave verbal consent to proceed.  History of Present Illness [name] is a 53 year old female who presents with stress and sleep disturbances. Husband is present today. Defers being interviewed alone.   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 She has submitted her two-week notice and is concerned about the financial implications, although she and her husband have made investments for their future. She will be cutting back to prn part time.   She has significant sleep disturbances, often staying awake until 3:30 AM due to racing thoughts. She uses Xanax  1 mg for sleep, which she finds helpful, and is attempting to reduce her dosage to one pill per night. Despite a recent vacation where she was able to sleep better, her sleep issues have persisted upon returning home. Also takes Alprazolam  during the day if needed for severe anxiety.   She mentions experiencing dry mouth and some gastrointestinal issues, including burning during urination and fecal leakage when wiping. She has a history of IBS with alternating constipation and diarrhea. She underwent a colonoscopy last year, which was normal except for the removal of a small polyp. She also had an endoscopy, which did not reveal any significant findings, and she continues to take pantoprazole  for reflux symptoms.  She reports a dry mouth and difficulty swallowing pills, which she attributes to a dry esophagus. No chest pain, shortness of breath, or wheezing, but she mentions having panic attacks. She is concerned about burning during urination.   Review of Systems  Constitutional:  Positive for  fatigue. Negative for fever.  Respiratory:  Negative for cough, chest tightness, shortness of breath and wheezing.   Cardiovascular:  Negative for chest pain.  Gastrointestinal:  Positive for abdominal pain, constipation, diarrhea and nausea. Negative for blood in stool and vomiting.  Genitourinary:  Positive for dysuria. Negative for frequency, pelvic pain and vaginal discharge.       No change in frequency or urgency.    Social History   Tobacco Use   Smoking status: Never   Smokeless tobacco: Never  Vaping Use   Vaping status: Never Used  Substance Use Topics   Alcohol use: Yes    Comment: occasional    Drug use: Never        Objective:   Physical Exam Vitals and nursing note reviewed.  Constitutional:      General: She is not in acute distress. Cardiovascular:     Rate and Rhythm: Normal rate and regular rhythm.  Pulmonary:     Effort: Pulmonary effort is normal.     Breath sounds: Normal breath sounds.  Abdominal:     General: Bowel sounds are normal. There is no distension.     Palpations: Abdomen is soft. There is no mass.     Tenderness: There is no abdominal tenderness. There is no right CVA tenderness, left CVA tenderness, guarding or rebound.  Skin:    General: Skin is warm and dry.  Neurological:     Mental Status: She is alert and oriented to person, place, and time.  Psychiatric:        Mood and Affect: Mood normal.  Behavior: Behavior normal.        Thought Content: Thought content normal.        Judgment: Judgment normal.     Comments: Fatigued in appearance. Making good eye contact. Speech clear. Calm affect.     Today's Vitals   11/11/23 1311  BP: 121/80  Pulse: (!) 107  Temp: 97.9 F (36.6 C)  SpO2: (!) 82%  Weight: 171 lb 3.2 oz (77.7 kg)  Height: 5' 5 (1.651 m)   Body mass index is 28.49 kg/m.  UA negative.        Assessment & Plan:  1. Burning with urination UA negative. Recheck if worsening or new symptoms. Go to UC  over the weekend if needed.  - POCT URINALYSIS DIP (CLINITEK)  2. Gastroesophageal reflux disease without esophagitis (Primary) Reflux symptoms controlled with pantoprazole .  3. Irritable bowel syndrome with constipation Symptoms possibly exacerbated by stress, improved during vacation, suggesting stress-related IBS. - Encourage stress management techniques. - Advise increased fluid intake.  4. Chronic insomnia - Continue alprazolam  1 mg at bedtime for one more month. - Plan to gradually wean off alprazolam  once she has quit full time position.  - Encourage stress reduction techniques and quality sleep. - ALPRAZolam  (XANAX ) 1 MG tablet; Take 1 tablet (1 mg total) by mouth 2 (two) times daily as needed. for anxiety  Dispense: 60 tablet; Refill: 0  5. Anxiety Continue Duloxetine . Plan to wean off Xanax  once she has stopped full time work and gets adjusted. Discussed the importance of self care and healthy lifestyle.  - ALPRAZolam  (XANAX ) 1 MG tablet; Take 1 tablet (1 mg total) by mouth 2 (two) times daily as needed. for anxiety  Dispense: 60 tablet; Refill: 0  Return in about 3 months (around 02/10/2024). Call back sooner if needed.

## 2023-11-13 ENCOUNTER — Other Ambulatory Visit: Payer: Self-pay

## 2023-11-13 ENCOUNTER — Ambulatory Visit
Admission: EM | Admit: 2023-11-13 | Discharge: 2023-11-13 | Disposition: A | Discharge status: Home / Self Care | Attending: Family Medicine | Admitting: Family Medicine

## 2023-11-13 ENCOUNTER — Encounter: Payer: Self-pay | Admitting: Emergency Medicine

## 2023-11-13 DIAGNOSIS — J3089 Other allergic rhinitis: Secondary | ICD-10-CM

## 2023-11-13 DIAGNOSIS — H6991 Unspecified Eustachian tube disorder, right ear: Secondary | ICD-10-CM

## 2023-11-13 MED ORDER — AZELASTINE HCL 0.1 % NA SOLN
1.0000 | Freq: Two times a day (BID) | NASAL | 0 refills | Status: DC
Start: 2023-11-13 — End: 2023-11-28

## 2023-11-13 MED ORDER — DEXAMETHASONE SODIUM PHOSPHATE 10 MG/ML IJ SOLN
10.0000 mg | Freq: Once | INTRAMUSCULAR | Status: AC
  Administered 2023-11-13: 10 mg via INTRAMUSCULAR

## 2023-11-13 NOTE — ED Triage Notes (Signed)
 Pt reports right ear pain, ringing, and gait is off since yesterday. Denies any known injury. Has been taking tylenol  with minimal relief of symptoms.

## 2023-11-13 NOTE — ED Provider Notes (Signed)
 [facility]    CSN: [id number] Arrival date & time: 11/13/23  0810      History   Chief Complaint Chief Complaint  Patient presents with   Ear Pain    HPI [name] is a 53 y.o. female.   Patient presenting today with 1 day history of right ear pain, pressure, ringing, decreased hearing and feeling off balance.  Denies known injury, fevers, chills, cough, chest pain, shortness of breath, palpitations, mental status changes, weakness numbness or tingling of extremities.  So far trying Tylenol  with minimal relief.  Does take an allergy  tablet daily for seasonal allergies.    Past Medical History:  Diagnosis Date   Allergy     Anxiety    Diabetes mellitus without complication (HCC)    Gout    History of migraine headaches    Hx of migraines    Hypertension not at goal 03/26/2016   IBS (irritable bowel syndrome)    Impaired fasting glucose     Patient Active Problem List   Diagnosis Date Noted   History of surgery 07/28/2023   Pelvic prolapse 05/31/2023   Dysphonia 11/19/2021   Subjective tinnitus of both ears 11/19/2021   Tongue, fissured 11/19/2021   Respiratory infection 08/03/2021   Chronic idiopathic gout involving toe of left foot without tophus 09/06/2020   Migraine without aura and without status migrainosus, not intractable 04/04/2020   Seasonal allergic rhinitis 03/03/2020   Type 2 diabetes mellitus (HCC) 02/03/2019   Mixed hyperlipidemia 02/03/2019   B12 deficiency 12/18/2018   NAFL (nonalcoholic fatty liver) 11/07/2018   Elevated liver enzymes 09/22/2018   Chronic insomnia 08/19/2018   Central obesity 08/19/2018   Peripheral polyneuropathy 09/20/2017   Acne rosacea 11/20/2016   Vitamin D  deficiency 10/29/2014   Class 1 obesity due to excess calories with serious comorbidity and body mass index (BMI) of 33.0 to 33.9 in adult 04/05/2014   Esophageal reflux 10/13/2013   Irritable bowel syndrome with constipation 06/25/2013   Anxiety  02/14/2013   Knee pain, chronic 02/14/2013    Past Surgical History:  Procedure Laterality Date   ABDOMINAL HYSTERECTOMY  2005   partial   ANTERIOR AND POSTERIOR REPAIR WITH SACROSPINOUS FIXATION N/A 05/31/2023   Procedure: ANTERIOR AND POSTERIOR REPAIR WITH SACROSPINOUS FIXATION;  Surgeon: Curlene Agent, MD;  Location: [facility] OR;  Service: Gynecology;  Laterality: N/A;   BLADDER SUSPENSION  2012   BREAST REDUCTION SURGERY  2002   CHOLECYSTECTOMY  2005   COLONOSCOPY  07/10/2021   CYSTOSCOPY  05/31/2023   Procedure: CYSTOSCOPY;  Surgeon: Curlene Agent, MD;  Location: [facility] OR;  Service: Gynecology;;   EYE SURGERY  1999   laser    UPPER GASTROINTESTINAL ENDOSCOPY     20 years ago by Dr. Shaaron. - told she had ulcers    OB History   No obstetric history on file.      Home Medications    Prior to Admission medications   Medication Sig Start Date End Date Taking? Authorizing Provider  azelastine  (ASTELIN ) 0.1 % nasal spray Place 1 spray into both nostrils 2 (two) times daily. Use in each nostril as directed 11/13/23  Yes Stuart Vernell Norris, PA-C  allopurinol  (ZYLOPRIM ) 300 MG tablet Take 1 tablet (300 mg total) by mouth 2 (two) times daily. 06/04/22   Hoskins, [name] C, NP  ALPRAZolam  (XANAX ) 1 MG tablet Take 1 tablet (1 mg total) by mouth 2 (two) times daily as needed. for anxiety 11/12/23   Mauro Elveria BROCKS,  NP  Continuous Glucose Sensor (FREESTYLE LIBRE 3 PLUS SENSOR) MISC Change sensor every 15 days. 08/09/23   Alphonsa Glendia LABOR, MD  cyanocobalamin  (VITAMIN B12) 1000 MCG/ML injection Inject 1 mL into the muscle every 30 days. 06/07/23   Alphonsa Glendia LABOR, MD  cyclobenzaprine  (FLEXERIL ) 10 MG tablet Take 1 tablet (10 mg total) by mouth daily as needed for muscle spasms. 06/28/23   Hoskins, [name] C, NP  desonide  (DESOWEN ) 0.05 % cream Apply thin amount bid prn 08/05/23   Alphonsa Glendia LABOR, MD  DULoxetine  (CYMBALTA ) 60 MG capsule Take 1 capsule (60 mg total) by mouth daily. 09/28/23   Skeet Juliene SAUNDERS, DO  EPINEPHrine  0.3 mg/0.3 mL IJ SOAJ injection Inject 0.3 mg into the muscle as needed for anaphylaxis. 09/08/22   Iva Marty Saltness, MD  famotidine  (PEPCID ) 40 MG tablet Take 1 tablet (40 mg total) by mouth at bedtime. 11/02/23   Alphonsa Glendia LABOR, MD  gabapentin  (NEURONTIN ) 800 MG tablet Take 1 tablet (800 mg total) by mouth 3 (three) times daily. 07/27/23   Skeet Juliene SAUNDERS, DO  Galcanezumab -gnlm (EMGALITY ) 120 MG/ML SOAJ Inject 120 mg into the skin every 28 (twenty-eight) days. 07/29/23   Skeet Juliene SAUNDERS, DO  glucose blood (FREESTYLE LITE) test strip Use twice daily as directed to check blood sugar 08/05/23   Luking, Scott A, MD  Iron , Ferrous Sulfate , 325 (65 Fe) MG TABS Take one tablet (325 mg) by mouth daily. 07/27/23   Alphonsa Glendia LABOR, MD  levocetirizine (XYZAL ) 5 MG tablet Take 1 tablet (5 mg total) by mouth daily as needed for allergies (Can take an etxra dose during flare ups). need appt for further refills 09/05/23   Iva Marty Saltness, MD  lidocaine -prilocaine  (EMLA ) cream Apply to feet as needed for nerve pain 09/26/23   Alphonsa Glendia LABOR, MD  metFORMIN  (GLUCOPHAGE ) 500 MG tablet Take 1 tablet (500 mg total) by mouth 2 (two) times daily with a meal. 07/29/23   Mauro Elveria BROCKS, NP  montelukast  (SINGULAIR ) 10 MG tablet Take 1 tablet (10 mg total) by mouth daily. 05/04/23   Iva Marty Saltness, MD  omega-3 acid ethyl esters (LOVAZA ) 1 g capsule Take 2 capsules (2 g total) by mouth 2 (two) times daily. 07/29/23   Hoskins, [name] C, NP  pantoprazole  (PROTONIX ) 40 MG tablet Take 1 tablet (40 mg total) by mouth 2 (two) times daily before a meal. 11/02/23   Luking, Glendia LABOR, MD  Perfluorohexyloctane  (MIEBO ) 1.338 GM/ML SOLN Place 1 drop into both eyes up to 4 (four) times daily as directed. 01/30/23     rosuvastatin  (CRESTOR ) 10 MG tablet Take 1 tablet (10 mg total) by mouth at bedtime. 09/05/23   Alphonsa Glendia LABOR, MD  tirzepatide  (MOUNJARO ) 15 MG/0.5ML Pen Inject 15 mg into the skin once a week. 07/29/23    Mauro Elveria BROCKS, NP  Ubrogepant  (UBRELVY ) 100 MG TABS Take 1 tablet (100 mg total) by mouth as needed. May repeat after 2 hours.  Maximum 2 tablets in 24 hours. 07/29/23   Skeet Juliene SAUNDERS, DO  Varenicline  Tartrate (TYRVAYA ) 0.03 MG/ACT SOLN Use twice daily as directed. 06/06/23     cetirizine  (ZYRTEC ) 10 MG tablet Take 1 tablet (10 mg total) by mouth daily. 09/24/19 03/03/20  Lavell Bari LABOR, FNP    Family History Family History  Problem Relation Age of Onset   Diabetes Mother    Neuropathy Mother    Heart failure Mother    Hypertension Mother  Cancer Mother    Asthma Father    Atrial fibrillation Father    Hypertension Father    Colonic polyp Father     Social History Social History   Tobacco Use   Smoking status: Never   Smokeless tobacco: Never  Vaping Use   Vaping status: Never Used  Substance Use Topics   Alcohol use: Yes    Comment: occasional    Drug use: Never     Allergies   Omnicef  [cefdinir ] and Losartan    Review of Systems Review of Systems Per HPI  Physical Exam Triage Vital Signs ED Triage Vitals  Encounter Vitals Group     BP 11/13/23 0818 116/77     Girls Systolic BP Percentile --      Girls Diastolic BP Percentile --      Boys Systolic BP Percentile --      Boys Diastolic BP Percentile --      Pulse Rate 11/13/23 0818 (!) 113     Resp 11/13/23 0818 20     Temp 11/13/23 0818 98.1 F (36.7 C)     Temp Source 11/13/23 0818 Oral     SpO2 --      Weight --      Height --      Head Circumference --      Peak Flow --      Pain Score 11/13/23 0816 5     Pain Loc --      Pain Education --      Exclude from Growth Chart --    No data found.  Updated Vital Signs BP 116/77 (BP Location: Right Arm)   Pulse (!) 113   Temp 98.1 F (36.7 C) (Oral)   Resp 20   Visual Acuity Right Eye Distance:   Left Eye Distance:   Bilateral Distance:    Right Eye Near:   Left Eye Near:    Bilateral Near:     Physical Exam Vitals and nursing note  reviewed.  Constitutional:      Appearance: Normal appearance. She is not ill-appearing.  HENT:     Head: Atraumatic.     Ears:     Comments: Bilateral middle ear effusions, worse on the right    Nose: Nose normal.     Mouth/Throat:     Mouth: Mucous membranes are moist.     Pharynx: Oropharynx is clear.  Eyes:     Extraocular Movements: Extraocular movements intact.     Conjunctiva/sclera: Conjunctivae normal.     Pupils: Pupils are equal, round, and reactive to light.  Cardiovascular:     Rate and Rhythm: Normal rate.  Pulmonary:     Effort: Pulmonary effort is normal.  Musculoskeletal:        General: Normal range of motion.     Cervical back: Normal range of motion and neck supple.  Skin:    General: Skin is warm and dry.  Neurological:     General: No focal deficit present.     Mental Status: She is alert and oriented to person, place, and time.     Cranial Nerves: No cranial nerve deficit.     Motor: No weakness.     Gait: Gait normal.  Psychiatric:        Mood and Affect: Mood normal.        Thought Content: Thought content normal.        Judgment: Judgment normal.      UC Treatments / Results  Labs (  all labs ordered are listed, but only abnormal results are displayed) Labs Reviewed - No data to display  EKG   Radiology No results found.  Procedures Procedures (including critical care time)  Medications Ordered in UC Medications  dexamethasone  (DECADRON ) injection 10 mg (10 mg Intramuscular Given 11/13/23 0926)    Initial Impression / Assessment and Plan / UC Course  I have reviewed the triage vital signs and the nursing notes.  Pertinent labs & imaging results that were available during my care of the patient were reviewed by me and considered in my medical decision making (see chart for details).     Vitals and exam reassuring today, no focal neurologic deficits or concerning exam features.  Suspect eustachian tube dysfunction possibly secondary  to uncontrolled seasonal allergies.  Treat with IM Decadron , Astelin , decongestants, supportive over-the-counter medications and home care.  Continue allergy  regimen.  Return for worsening symptoms.  Final Clinical Impressions(s) / UC Diagnoses   Final diagnoses:  Acute dysfunction of right eustachian tube  Seasonal allergic rhinitis due to other allergic trigger   Discharge Instructions   None    ED Prescriptions     Medication Sig Dispense Auth. Provider   azelastine  (ASTELIN ) 0.1 % nasal spray Place 1 spray into both nostrils 2 (two) times daily. Use in each nostril as directed 30 mL Stuart Vernell Norris, PA-C      PDMP not reviewed this encounter.   Stuart Vernell Norris, PA-C 11/13/23 1013

## 2023-11-17 ENCOUNTER — Encounter: Payer: Self-pay | Admitting: Nurse Practitioner

## 2023-11-22 ENCOUNTER — Other Ambulatory Visit: Payer: Self-pay

## 2023-11-25 ENCOUNTER — Encounter: Payer: Self-pay | Admitting: Nurse Practitioner

## 2023-11-28 ENCOUNTER — Other Ambulatory Visit: Payer: Self-pay | Admitting: Nurse Practitioner

## 2023-11-28 ENCOUNTER — Other Ambulatory Visit (HOSPITAL_COMMUNITY): Payer: Self-pay

## 2023-11-28 MED ORDER — AZELASTINE HCL 0.1 % NA SOLN
1.0000 | Freq: Two times a day (BID) | NASAL | 11 refills | Status: AC
  Filled 2023-11-28 – 2023-11-29 (×2): qty 30, 100d supply, fill #0
  Filled 2023-12-08: qty 30, 50d supply, fill #0

## 2023-11-29 ENCOUNTER — Other Ambulatory Visit: Payer: Self-pay | Admitting: Nurse Practitioner

## 2023-11-29 ENCOUNTER — Other Ambulatory Visit: Payer: Self-pay

## 2023-11-29 ENCOUNTER — Other Ambulatory Visit (HOSPITAL_COMMUNITY): Payer: Self-pay

## 2023-11-29 ENCOUNTER — Encounter: Payer: Self-pay | Admitting: Nurse Practitioner

## 2023-11-29 DIAGNOSIS — G629 Polyneuropathy, unspecified: Secondary | ICD-10-CM

## 2023-11-29 MED ORDER — CYCLOBENZAPRINE HCL 10 MG PO TABS
10.0000 mg | ORAL_TABLET | Freq: Every day | ORAL | 0 refills | Status: DC | PRN
  Filled 2023-11-29: qty 90, 90d supply, fill #0

## 2023-11-30 ENCOUNTER — Other Ambulatory Visit (HOSPITAL_COMMUNITY): Payer: Self-pay

## 2023-11-30 ENCOUNTER — Other Ambulatory Visit: Payer: Self-pay | Admitting: Nurse Practitioner

## 2023-11-30 MED ORDER — MONTELUKAST SODIUM 10 MG PO TABS
10.0000 mg | ORAL_TABLET | Freq: Every day | ORAL | 3 refills | Status: AC
  Filled 2023-11-30 – 2023-12-26 (×2): qty 90, 90d supply, fill #0
  Filled 2024-03-26: qty 90, 90d supply, fill #1

## 2023-12-09 ENCOUNTER — Other Ambulatory Visit: Payer: Self-pay

## 2023-12-09 ENCOUNTER — Other Ambulatory Visit (HOSPITAL_COMMUNITY): Payer: Self-pay

## 2023-12-09 ENCOUNTER — Other Ambulatory Visit: Payer: Self-pay | Admitting: Nurse Practitioner

## 2023-12-09 MED ORDER — LEVOCETIRIZINE DIHYDROCHLORIDE 5 MG PO TABS
5.0000 mg | ORAL_TABLET | Freq: Every day | ORAL | 3 refills | Status: AC | PRN
  Filled 2023-12-09: qty 90, 45d supply, fill #0
  Filled 2024-02-14: qty 90, 45d supply, fill #1
  Filled 2024-03-26: qty 90, 45d supply, fill #2

## 2023-12-19 ENCOUNTER — Encounter: Payer: Self-pay | Admitting: Nurse Practitioner

## 2023-12-19 ENCOUNTER — Ambulatory Visit: Admitting: Nurse Practitioner

## 2023-12-19 VITALS — BP 98/64 | Temp 97.7°F | Ht 65.0 in | Wt 166.0 lb

## 2023-12-19 DIAGNOSIS — M62838 Other muscle spasm: Secondary | ICD-10-CM | POA: Diagnosis not present

## 2023-12-19 DIAGNOSIS — I951 Orthostatic hypotension: Secondary | ICD-10-CM | POA: Diagnosis not present

## 2023-12-19 DIAGNOSIS — J300 Vasomotor rhinitis: Secondary | ICD-10-CM | POA: Diagnosis not present

## 2023-12-19 DIAGNOSIS — H9201 Otalgia, right ear: Secondary | ICD-10-CM | POA: Diagnosis not present

## 2023-12-19 NOTE — Patient Instructions (Signed)
Flonase Sensimist

## 2023-12-19 NOTE — Progress Notes (Signed)
 Subjective:    Patient ID: [name], female    DOB: 27-Aug-1970, 53 y.o.   MRN: [id number]  HPI  Discussed the use of AI scribe software for clinical note transcription with the patient, who gave verbal consent to proceed.  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  She has been experiencing a feeling of her right ear being 'stocked up' and ringing (tinnitus). These symptoms began about four weeks ago, prompting her to seek care at urgent care where she received a prednisone  injection. The symptoms initially improved but have recurred over the past week.  She describes headaches that start at the right occipital region and move forward to her eyebrow, with severe throbbing pain. She has a history of hemiplegic migraines and is currently on Emgality , with her next dose scheduled for tomorrow. No numbness or weakness of the face, arms, or legs, but she reports visual changes such as seeing spots and experiencing dizziness, especially when standing up quickly. Does not occur daily. No syncope.   She experiences episodes of dizziness and lightheadedness, often accompanied by seeing spots, lasting no more than five minutes. These episodes are sometimes triggered by standing up too quickly. She has a history of orthostatic hypotension and recalls a previous episode that led to an emergency room visit.  She is currently taking Singulair  in the morning and Xyzal  at night for allergies, along with a nasal spray twice a day. No sore throat, but she reports dry mouth. She describes headaches that start at the back of her head and move forward, which she associates with sinus issues. No chest pain, shortness of breath, or wheezing.  Her social history includes exposure to cigarette smoke, which she is not accustomed to, and she suspects it may be contributing to her symptoms. She also mentions attending  outdoor events where dust is prevalent, and she tries to wear a mask in such environments.   Review of Systems  Constitutional:  Positive for fatigue. Negative for fever.  HENT:  Positive for ear pain, postnasal drip, sinus pressure and tinnitus. Negative for sore throat.   Eyes:  Positive for visual disturbance.  Respiratory:  Negative for cough, chest tightness, shortness of breath and wheezing.   Cardiovascular:  Negative for chest pain.  Neurological:  Positive for light-headedness and headaches. Negative for syncope, facial asymmetry, speech difficulty, weakness and numbness.   Social History   Tobacco Use   Smoking status: Never   Smokeless tobacco: Never  Vaping Use   Vaping status: Never Used  Substance Use Topics   Alcohol use: Yes    Comment: occasional    Drug use: Never        Objective:   Physical Exam Vitals and nursing note reviewed.  Constitutional:      General: She is not in acute distress. HENT:     Ears:     Comments: TMs clear effusion, no erythema.     Mouth/Throat:     Mouth: Mucous membranes are moist.     Pharynx: Oropharynx is clear.  Eyes:     Extraocular Movements: Extraocular movements intact.     Pupils: Pupils are equal, round, and reactive to light.     Comments: No nystagmus.  Neck:     Comments: Mildly tender, anterior cervical adenopathy. Very tight tender cervical muscle compared to left. Tight lateral muscles bilaterally. Cardiovascular:  Rate and Rhythm: Normal rate and regular rhythm.  Pulmonary:     Effort: Pulmonary effort is normal.     Breath sounds: Normal breath sounds.  Musculoskeletal:     Cervical back: Neck supple.  Lymphadenopathy:     Cervical: Cervical adenopathy present.  Skin:    General: Skin is warm and dry.  Neurological:     Mental Status: She is alert and oriented to person, place, and time.     Gait: Gait normal.     Deep Tendon Reflexes: Reflexes normal.  Psychiatric:        Mood and Affect:  Mood normal.        Behavior: Behavior normal.        Thought Content: Thought content normal.    Today's Vitals   12/19/23 1139 12/19/23 1215  BP: 102/70 98/64  Temp: 97.7 F (36.5 C)   Weight: 166 lb (75.3 kg)   Height: 5' 5 (1.651 m)    Body mass index is 27.62 kg/m.         Assessment & Plan:  1. Vasomotor rhinitis (Primary) Sinus pressure and ear fluid likely due to environmental irritants. No infection present. - Continue Sinclair in the morning and Xyzal  5 mg at night. - Use nasal spray twice daily as prescribed. - Avoid environmental irritants such as cigarette smoke. - Consider short course of Prednisone  if no better over the next several days of steroid nasal spray use.   2. Right ear pain Recurrent ear pressure and tinnitus due to Eustachian tube dysfunction and middle ear effusion, exacerbated by environmental factors. No infection present. - Continue Sinclair in the morning and Xyzal  5 mg at night. - Use nasal spray twice daily as prescribed. - Avoid environmental irritants such as cigarette smoke.   3. Muscle spasms of neck Severe right cervical muscle tightness and pain contributing to headaches, likely posture-related. - Apply ice or heat to the affected area. - Perform general exercises to relieve muscle tension. - Consider using a massage pillow with heat and vibration.   4. Orthostatic hypotension Intermittent dizziness and lightheadedness possibly due to orthostatic hypotension, exacerbated by dehydration. - Check blood pressure in standing position. - Ensure adequate hydration.  Follow up with neurologist as planned next month for migraines.   Return if symptoms worsen or fail to improve.

## 2023-12-26 ENCOUNTER — Other Ambulatory Visit: Payer: Self-pay | Admitting: Neurology

## 2023-12-26 ENCOUNTER — Other Ambulatory Visit: Payer: Self-pay | Admitting: Family Medicine

## 2023-12-26 ENCOUNTER — Other Ambulatory Visit: Payer: Self-pay | Admitting: Nurse Practitioner

## 2023-12-26 ENCOUNTER — Encounter: Payer: Self-pay | Admitting: Neurology

## 2023-12-26 ENCOUNTER — Other Ambulatory Visit: Payer: Self-pay

## 2023-12-26 ENCOUNTER — Other Ambulatory Visit (HOSPITAL_COMMUNITY): Payer: Self-pay

## 2023-12-26 DIAGNOSIS — G629 Polyneuropathy, unspecified: Secondary | ICD-10-CM

## 2023-12-26 MED ORDER — EMGALITY 120 MG/ML ~~LOC~~ SOAJ
120.0000 mg | SUBCUTANEOUS | 0 refills | Status: DC
  Filled 2023-12-26 – 2023-12-29 (×2): qty 3, 84d supply, fill #0

## 2023-12-26 MED ORDER — LIDOCAINE-PRILOCAINE 2.5-2.5 % EX CREA
TOPICAL_CREAM | CUTANEOUS | 2 refills | Status: AC
  Filled 2023-12-26 – 2024-01-02 (×3): qty 60, 30d supply, fill #0
  Filled 2024-01-23 – 2024-01-26 (×2): qty 60, 30d supply, fill #1
  Filled 2024-03-13: qty 60, 30d supply, fill #2

## 2023-12-27 ENCOUNTER — Other Ambulatory Visit: Payer: Self-pay

## 2023-12-28 ENCOUNTER — Other Ambulatory Visit (HOSPITAL_COMMUNITY): Payer: Self-pay

## 2023-12-29 NOTE — Progress Notes (Unsigned)
 NEUROLOGY FOLLOW UP OFFICE NOTE  [name] [id number]  Assessment/Plan:   Hemiplegic migraine.  TIA felt to be less likely.  She does have stroke risk factors such as high cholesterol and diabetes, but based on age, associated headache and normal MRI without any evidence of chronic microvascular ischemic changes, migraine more likely Migraine without aura, without status migrainosus, not intractable Diabetic polyneuropathy Right occipital neuralgia   Migraine prevention:  Emgality  Migraine rescue:  Ubrelvy  100mg .  Triptans are contraindicated. Neuropathic pain: duloxetine  60mg  daily; gabapentin  to 800mg  three times daily, use lidocaine  cream as needed.  Wear slippers with rubber sole in the house. Limit use of pain relievers to no more than 9 days out of month to prevent risk of rebound or medication-overuse headache. Lifestyle modification Keep headache diary Follow up 7 months.       Subjective:  [name] is a 53 year old right-handed female with DM II, hypercholesterolemia, migraines and anxiety who follows up for migraines and neuropathy.  CT and MRI of brain personally reviewed.  UPDATE: Migraines: On Emgality .   Now semi-retired, so stress is much improved.   Intensity:  severe Duration:  2 hours with Ubrelvy  Frequency:  1 to 2 days  month right after the Emgality  injection (not better if takes Nurtec preemptively)  Occipital neuralgia, right: She has had a new headache, a shooting pain from the right occipital region radiating to the right temple and eye.  Occurs 3 or 4 days a month.  Gets a massage which helps.     Diabetic neuropathy: Taking Cymbalta  60mg  daily, gabapentin  800mg  three times daily, lidocaine -prilocaine  cream PRN  Since mid-October, burning in both feet at night, along the medial aspect, and turns red.  Occurs 3 to 4 times a week and treats as needed with cream.  Since retiring, she has been at home more, walking around in just her  socks.  Current medications: Current NSAIDS/analgesics:  ASA 81mg  daily, oxycodone  Current triptans:  rizatriptan  Current ergotamine:  none Current anti-emetic:  none Current muscle relaxants:  Flexeril  10mg  QD PRN, methocarbamol  500mg  PRN Current Antihypertensive medications:  none Current Antidepressant medications:  amitriptyline  100mg  QHS Current Anticonvulsant medications:  gabapentin  800mg  three times daily (pain) Current anti-CGRP:  Ubrelvy  100mg  Other medications:  alprazolam  PRN (sleep), rosuvastatin  10mg   HISTORY:  Migraines: On 11/16/2021, she developed right sided facial droop, slurred speech, blurred vision and right sided upper and lower extremity numbness and weakness.  She also had severe pounding headache on top of her head.  She presented to [facility] where she was a code stroke and had an NIHSS of 5.  CT head showed no possible hypodensity in the posterior left temporal lobe, which was determined to be artifact.  She received TNK and was transferred to [facility].  Symptoms lasted no more than 2 to 3 hours.  CTA head and neck showed no LVO or hemodynamically significant stenosis.  MRI of brain showed no acute intracranial abnormality.  2D echo showed LVEF 65-70%.  LDL was 38 and Hgb A1c was 6.8.  Discharged on ASA 81mg  and Plavix  75mg  daily for 21 days followed by ASA 81mg  monotherapy.    On 03/12/2023, she had an intractable headache.  Persisted despite Ubrelvy  and on 1/22 she had a syncopal episode and fell.[name]  She went to the ED at [facility].  Found to be tachycardic and labs revealed AKI. CT head and follow up MRI of brain with and without  contrast were unremarkable and showed no acute intracranial abnormality.  Syncopal spell believed to be due to dehydration and she was treated with IVF.  Headache treated with headache cocktail and headache improved.     She does have history of migraines which are manageable.  They are right sided associated  with blurred vision and photophobia lasting 2 hours with rizatriptan  and occurs no more than 2 days a month.  Also has history of migraines - pounding in back of head or across the front with right ptosis and phonophobia.   Diabetic polyneuropathy: She also has diabetic polyneuropathy since at least 2019.  She endorses numbness and pain in the feet.  Labs were unremarkable, including B12 >2000, MMA 138, folate 10.3, B1 142.3, B6 6.1, negative IFE for M-spike, TSH 2.190, negative ANA, negative RF, sed rate 13, negative SSA/SSB antibodies, negative pan-ANCA panel, negative Lyme IgG/IgM, nonreactive RPR, negative Hep C antibody, negative Celiac panel, negative heavy metals.  Hgb A1c increased in 2022 but has steadily declined from 7.5 to 6.8.    Past medications:  Past NSAIDS/analgesics:  naproxen , ibuprofen , diclofenac , indomethacin , acetaminophen  Past abortive triptans:  sumatriptan Past anti-emetic:  Zofran  Past antihypertensive medications:  metoprolol , losartan , valsartan  Past antidepressant medications:  amitriptyline , sertraline , escitalopram        PAST MEDICAL HISTORY: Past Medical History:  Diagnosis Date   Allergy     Anxiety    Diabetes mellitus without complication (HCC)    Gout    History of migraine headaches    Hx of migraines    Hypertension not at goal 03/26/2016   IBS (irritable bowel syndrome)    Impaired fasting glucose     MEDICATIONS: Current Outpatient Medications on File Prior to Visit  Medication Sig Dispense Refill   allopurinol  (ZYLOPRIM ) 300 MG tablet Take 1 tablet (300 mg total) by mouth 2 (two) times daily. 180 tablet 1   ALPRAZolam  (XANAX ) 1 MG tablet Take 1 tablet (1 mg total) by mouth 2 (two) times daily as needed. for anxiety 60 tablet 0   azelastine  (ASTELIN ) 0.1 % nasal spray Place 1 spray into both nostrils 2 (two) times daily. Use in each nostril as directed 30 mL 11   Continuous Glucose Sensor (FREESTYLE LIBRE 3 PLUS SENSOR) MISC Change sensor  every 15 days. 3 each 6   cyanocobalamin  (VITAMIN B12) 1000 MCG/ML injection Inject 1 mL into the muscle every 30 days. 3 mL 4   cyclobenzaprine  (FLEXERIL ) 10 MG tablet Take 1 tablet (10 mg total) by mouth daily as needed for muscle spasms. 90 tablet 0   DULoxetine  (CYMBALTA ) 60 MG capsule Take 1 capsule (60 mg total) by mouth daily. 90 capsule 1   famotidine  (PEPCID ) 40 MG tablet Take 1 tablet (40 mg total) by mouth at bedtime. 90 tablet 1   gabapentin  (NEURONTIN ) 800 MG tablet Take 1 tablet (800 mg total) by mouth 3 (three) times daily. 270 tablet 1   Galcanezumab -gnlm (EMGALITY ) 120 MG/ML SOAJ Inject 120 mg into the skin every 28 (twenty-eight) days. 3 mL 0   glucose blood (FREESTYLE LITE) test strip Use twice daily as directed to check blood sugar 100 each 5   Iron , Ferrous Sulfate , 325 (65 Fe) MG TABS Take one tablet (325 mg) by mouth daily. 90 tablet 1   levocetirizine (XYZAL ) 5 MG tablet Take 1 tablet (5 mg total) by mouth daily as needed for allergies (Can take an etxra dose during flare ups). 180 tablet 3   lidocaine -prilocaine  (EMLA ) cream Apply to feet  as needed for nerve pain 60 g 2   metFORMIN  (GLUCOPHAGE ) 500 MG tablet Take 1 tablet (500 mg total) by mouth 2 (two) times daily with a meal. 180 tablet 1   montelukast  (SINGULAIR ) 10 MG tablet Take 1 tablet (10 mg total) by mouth daily. 90 tablet 3   omega-3 acid ethyl esters (LOVAZA ) 1 g capsule Take 2 capsules (2 g total) by mouth 2 (two) times daily. 360 capsule 1   pantoprazole  (PROTONIX ) 40 MG tablet Take 1 tablet (40 mg total) by mouth 2 (two) times daily before a meal. 180 tablet 1   Perfluorohexyloctane  (MIEBO ) 1.338 GM/ML SOLN Place 1 drop into both eyes up to 4 (four) times daily as directed. 3 mL 99   rosuvastatin  (CRESTOR ) 10 MG tablet Take 1 tablet (10 mg total) by mouth at bedtime. 90 tablet 1   tirzepatide  (MOUNJARO ) 15 MG/0.5ML Pen Inject 15 mg into the skin once a week. 6 mL 1   Ubrogepant  (UBRELVY ) 100 MG TABS Take 1  tablet (100 mg total) by mouth as needed. May repeat after 2 hours.  Maximum 2 tablets in 24 hours. 16 tablet 5   Varenicline  Tartrate (TYRVAYA ) 0.03 MG/ACT SOLN Use twice daily as directed. 25.2 mL 3   [DISCONTINUED] cetirizine  (ZYRTEC ) 10 MG tablet Take 1 tablet (10 mg total) by mouth daily. 90 tablet 3   No current facility-administered medications on file prior to visit.    ALLERGIES: Allergies  Allergen Reactions   Omnicef  [Cefdinir ] Other (See Comments)    Patient states antibiotic does not work. Has tried multiple times.   Losartan  Rash    Broke out on face only; no allergic reaction    FAMILY HISTORY: Family History  Problem Relation Age of Onset   Diabetes Mother    Neuropathy Mother    Heart failure Mother    Hypertension Mother    Cancer Mother    Asthma Father    Atrial fibrillation Father    Hypertension Father    Colonic polyp Father       Objective:  Blood pressure 120/76, pulse (!) 102, height 5' 6 (1.676 m), weight 161 lb 12.8 oz (73.4 kg), SpO2 100%. General: No acute distress.  Patient appears well Head:  Normocephalic/atraumatic Neck:  Supple.  No paraspinal tenderness.  Full range of motion. Heart:  Regular rate and rhythm. Neuro:  Alert and oriented.  Speech fluent and not dysarthric.  Language intact.  CN II-XII intact.  Bulk and tone normal.  Muscle strength 5/5 throughout.  Sensation to pinprick in feet up to knees, vibratory sensation intact.  Deep tendon reflexes 1+ throughout, toes downgoing.  Gait normal.  Romberg negative.     Juliene Dunnings, DO  CC: Glendia Fielding, MD

## 2023-12-30 ENCOUNTER — Other Ambulatory Visit: Payer: Self-pay

## 2023-12-30 ENCOUNTER — Ambulatory Visit (INDEPENDENT_AMBULATORY_CARE_PROVIDER_SITE_OTHER): Admitting: Neurology

## 2023-12-30 ENCOUNTER — Other Ambulatory Visit (HOSPITAL_COMMUNITY): Payer: Self-pay

## 2023-12-30 ENCOUNTER — Encounter: Payer: Self-pay | Admitting: Neurology

## 2023-12-30 VITALS — BP 120/76 | HR 102 | Ht 66.0 in | Wt 161.8 lb

## 2023-12-30 DIAGNOSIS — E1142 Type 2 diabetes mellitus with diabetic polyneuropathy: Secondary | ICD-10-CM

## 2023-12-30 DIAGNOSIS — G43009 Migraine without aura, not intractable, without status migrainosus: Secondary | ICD-10-CM

## 2023-12-30 MED ORDER — GABAPENTIN 800 MG PO TABS
800.0000 mg | ORAL_TABLET | Freq: Three times a day (TID) | ORAL | 1 refills | Status: AC
  Filled 2023-12-30 – 2024-01-26 (×4): qty 270, 90d supply, fill #0

## 2023-12-30 MED ORDER — EMGALITY 120 MG/ML ~~LOC~~ SOAJ
120.0000 mg | SUBCUTANEOUS | 5 refills | Status: AC
  Filled 2023-12-30 – 2024-01-16 (×3): qty 3, 84d supply, fill #0
  Filled 2024-03-26: qty 3, 90d supply, fill #1

## 2023-12-30 MED ORDER — UBRELVY 100 MG PO TABS
1.0000 | ORAL_TABLET | ORAL | 5 refills | Status: AC | PRN
  Filled 2023-12-30: qty 16, 30d supply, fill #0

## 2023-12-30 MED ORDER — DULOXETINE HCL 60 MG PO CPEP
60.0000 mg | ORAL_CAPSULE | Freq: Every day | ORAL | 1 refills | Status: AC
  Filled 2023-12-30: qty 90, 90d supply, fill #0
  Filled 2024-03-26: qty 90, 90d supply, fill #1

## 2023-12-31 ENCOUNTER — Other Ambulatory Visit (HOSPITAL_COMMUNITY): Payer: Self-pay

## 2024-01-02 ENCOUNTER — Other Ambulatory Visit: Payer: Self-pay

## 2024-01-02 ENCOUNTER — Other Ambulatory Visit (HOSPITAL_COMMUNITY): Payer: Self-pay

## 2024-01-03 ENCOUNTER — Other Ambulatory Visit (HOSPITAL_COMMUNITY): Payer: Self-pay

## 2024-01-03 ENCOUNTER — Other Ambulatory Visit: Payer: Self-pay

## 2024-01-09 ENCOUNTER — Ambulatory Visit: Admitting: Neurology

## 2024-01-16 ENCOUNTER — Encounter: Payer: Self-pay | Admitting: Nurse Practitioner

## 2024-01-16 ENCOUNTER — Other Ambulatory Visit: Payer: Self-pay | Admitting: Family Medicine

## 2024-01-16 ENCOUNTER — Encounter: Payer: Self-pay | Admitting: Family Medicine

## 2024-01-16 ENCOUNTER — Telehealth: Payer: Self-pay | Admitting: Nurse Practitioner

## 2024-01-16 ENCOUNTER — Other Ambulatory Visit: Payer: Self-pay

## 2024-01-16 ENCOUNTER — Other Ambulatory Visit (HOSPITAL_COMMUNITY): Payer: Self-pay

## 2024-01-16 DIAGNOSIS — F5104 Psychophysiologic insomnia: Secondary | ICD-10-CM

## 2024-01-16 DIAGNOSIS — F419 Anxiety disorder, unspecified: Secondary | ICD-10-CM

## 2024-01-16 MED ORDER — ALPRAZOLAM 1 MG PO TABS: 1.0000 mg | ORAL_TABLET | Freq: Two times a day (BID) | ORAL | 0 refills | Status: DC | PRN

## 2024-01-16 NOTE — Telephone Encounter (Signed)
 A refill was sent into [facility] to cover her until she has in the appointment with Elveria coming up in December

## 2024-01-16 NOTE — Telephone Encounter (Signed)
 Refill on   ALPRAZolam  (XANAX ) 1 MG tablet  [employer]

## 2024-01-17 ENCOUNTER — Other Ambulatory Visit: Payer: Self-pay | Admitting: Nurse Practitioner

## 2024-01-17 DIAGNOSIS — R748 Abnormal levels of other serum enzymes: Secondary | ICD-10-CM

## 2024-01-17 DIAGNOSIS — E782 Mixed hyperlipidemia: Secondary | ICD-10-CM

## 2024-01-17 DIAGNOSIS — E1140 Type 2 diabetes mellitus with diabetic neuropathy, unspecified: Secondary | ICD-10-CM

## 2024-01-17 DIAGNOSIS — G629 Polyneuropathy, unspecified: Secondary | ICD-10-CM

## 2024-01-17 DIAGNOSIS — E538 Deficiency of other specified B group vitamins: Secondary | ICD-10-CM

## 2024-01-20 ENCOUNTER — Other Ambulatory Visit (HOSPITAL_COMMUNITY): Payer: Self-pay

## 2024-01-20 ENCOUNTER — Encounter (HOSPITAL_COMMUNITY): Payer: Self-pay

## 2024-01-23 ENCOUNTER — Other Ambulatory Visit: Payer: Self-pay

## 2024-01-23 ENCOUNTER — Other Ambulatory Visit: Payer: Self-pay | Admitting: Family Medicine

## 2024-01-23 MED ORDER — IRON (FERROUS SULFATE) 325 (65 FE) MG PO TABS
325.0000 mg | ORAL_TABLET | Freq: Every day | ORAL | 1 refills | Status: AC
  Filled 2024-01-23: qty 90, 90d supply, fill #0

## 2024-01-24 ENCOUNTER — Other Ambulatory Visit (HOSPITAL_COMMUNITY): Payer: Self-pay

## 2024-01-24 ENCOUNTER — Other Ambulatory Visit: Payer: Self-pay

## 2024-01-26 ENCOUNTER — Other Ambulatory Visit: Payer: Self-pay

## 2024-01-26 ENCOUNTER — Ambulatory Visit: Admitting: Nurse Practitioner

## 2024-01-27 ENCOUNTER — Ambulatory Visit: Admitting: Nurse Practitioner

## 2024-01-27 ENCOUNTER — Other Ambulatory Visit: Payer: Self-pay

## 2024-01-27 ENCOUNTER — Other Ambulatory Visit (HOSPITAL_COMMUNITY): Payer: Self-pay

## 2024-02-07 DIAGNOSIS — R748 Abnormal levels of other serum enzymes: Secondary | ICD-10-CM | POA: Diagnosis not present

## 2024-02-07 DIAGNOSIS — G629 Polyneuropathy, unspecified: Secondary | ICD-10-CM | POA: Diagnosis not present

## 2024-02-07 DIAGNOSIS — E782 Mixed hyperlipidemia: Secondary | ICD-10-CM | POA: Diagnosis not present

## 2024-02-07 DIAGNOSIS — E1140 Type 2 diabetes mellitus with diabetic neuropathy, unspecified: Secondary | ICD-10-CM | POA: Diagnosis not present

## 2024-02-07 DIAGNOSIS — E538 Deficiency of other specified B group vitamins: Secondary | ICD-10-CM | POA: Diagnosis not present

## 2024-02-09 LAB — IRON,TIBC AND FERRITIN PANEL
Ferritin: 122 ng/mL (ref 15–150)
Iron Saturation: 28 % (ref 15–55)
Iron: 89 ug/dL (ref 27–159)
Total Iron Binding Capacity: 318 ug/dL (ref 250–450)
UIBC: 229 ug/dL (ref 131–425)

## 2024-02-09 LAB — HEMOGLOBIN A1C
Est. average glucose Bld gHb Est-mCnc: 128 mg/dL
Hgb A1c MFr Bld: 6.1 % — ABNORMAL HIGH (ref 4.8–5.6)

## 2024-02-09 LAB — CBC WITH DIFFERENTIAL/PLATELET
Basophils Absolute: 0.1 x10E3/uL (ref 0.0–0.2)
Basos: 1 %
EOS (ABSOLUTE): 0.2 x10E3/uL (ref 0.0–0.4)
Eos: 3 %
Hematocrit: 43.1 % (ref 34.0–46.6)
Hemoglobin: 14 g/dL (ref 11.1–15.9)
Immature Grans (Abs): 0 x10E3/uL (ref 0.0–0.1)
Immature Granulocytes: 0 %
Lymphocytes Absolute: 2.4 x10E3/uL (ref 0.7–3.1)
Lymphs: 36 %
MCH: 29.8 pg (ref 26.6–33.0)
MCHC: 32.5 g/dL (ref 31.5–35.7)
MCV: 92 fL (ref 79–97)
Monocytes Absolute: 0.5 x10E3/uL (ref 0.1–0.9)
Monocytes: 7 %
Neutrophils Absolute: 3.5 x10E3/uL (ref 1.4–7.0)
Neutrophils: 53 %
Platelets: 299 x10E3/uL (ref 150–450)
RBC: 4.7 x10E6/uL (ref 3.77–5.28)
RDW: 12.7 % (ref 11.7–15.4)
WBC: 6.6 x10E3/uL (ref 3.4–10.8)

## 2024-02-09 LAB — COMPREHENSIVE METABOLIC PANEL WITH GFR
ALT: 19 IU/L (ref 0–32)
AST: 28 IU/L (ref 0–40)
Albumin: 4.5 g/dL (ref 3.8–4.9)
Alkaline Phosphatase: 60 IU/L (ref 49–135)
BUN/Creatinine Ratio: 11 (ref 9–23)
BUN: 9 mg/dL (ref 6–24)
Bilirubin Total: 0.5 mg/dL (ref 0.0–1.2)
CO2: 29 mmol/L (ref 20–29)
Calcium: 9.5 mg/dL (ref 8.7–10.2)
Chloride: 96 mmol/L (ref 96–106)
Creatinine, Ser: 0.85 mg/dL (ref 0.57–1.00)
Globulin, Total: 1.9 g/dL (ref 1.5–4.5)
Glucose: 121 mg/dL — ABNORMAL HIGH (ref 70–99)
Potassium: 3.4 mmol/L — ABNORMAL LOW (ref 3.5–5.2)
Sodium: 143 mmol/L (ref 134–144)
Total Protein: 6.4 g/dL (ref 6.0–8.5)
eGFR: 81 mL/min/1.73 (ref >59)

## 2024-02-09 LAB — LIPID PANEL
Chol/HDL Ratio: 2.7 ratio (ref 0.0–4.4)
Cholesterol, Total: 109 mg/dL (ref 100–199)
HDL: 41 mg/dL (ref >39)
LDL Chol Calc (NIH): 50 mg/dL (ref 0–99)
Triglycerides: 95 mg/dL (ref 0–149)
VLDL Cholesterol Cal: 18 mg/dL (ref 5–40)

## 2024-02-09 LAB — MICROALBUMIN / CREATININE URINE RATIO
Creatinine, Urine: 75.1 mg/dL
Microalb/Creat Ratio: 44 mg/g{creat} — AB (ref 0–29)
Microalbumin, Urine: 33.2 ug/mL

## 2024-02-09 LAB — VITAMIN B12: Vitamin B-12: 511 pg/mL (ref 232–1245)

## 2024-02-13 ENCOUNTER — Ambulatory Visit: Admitting: Nurse Practitioner

## 2024-02-13 ENCOUNTER — Encounter: Payer: Self-pay | Admitting: Nurse Practitioner

## 2024-02-13 ENCOUNTER — Other Ambulatory Visit: Payer: Self-pay

## 2024-02-13 ENCOUNTER — Other Ambulatory Visit (HOSPITAL_COMMUNITY): Payer: Self-pay

## 2024-02-13 VITALS — BP 120/84 | HR 100 | Temp 98.1°F | Ht 66.0 in | Wt 156.0 lb

## 2024-02-13 DIAGNOSIS — Z7984 Long term (current) use of oral hypoglycemic drugs: Secondary | ICD-10-CM

## 2024-02-13 DIAGNOSIS — E782 Mixed hyperlipidemia: Secondary | ICD-10-CM

## 2024-02-13 DIAGNOSIS — F5104 Psychophysiologic insomnia: Secondary | ICD-10-CM

## 2024-02-13 DIAGNOSIS — E538 Deficiency of other specified B group vitamins: Secondary | ICD-10-CM

## 2024-02-13 DIAGNOSIS — F419 Anxiety disorder, unspecified: Secondary | ICD-10-CM | POA: Diagnosis not present

## 2024-02-13 DIAGNOSIS — E1140 Type 2 diabetes mellitus with diabetic neuropathy, unspecified: Secondary | ICD-10-CM

## 2024-02-13 DIAGNOSIS — J0110 Acute frontal sinusitis, unspecified: Secondary | ICD-10-CM

## 2024-02-13 MED ORDER — AMOXICILLIN-POT CLAVULANATE 875-125 MG PO TABS: 1.0000 | ORAL_TABLET | Freq: Two times a day (BID) | ORAL | 0 refills | Status: AC

## 2024-02-13 MED ORDER — FREESTYLE LITE TEST VI STRP
ORAL_STRIP | 5 refills | Status: AC
  Filled 2024-02-13: qty 100, 50d supply, fill #0

## 2024-02-13 MED ORDER — VALSARTAN 40 MG PO TABS
40.0000 mg | ORAL_TABLET | Freq: Every day | ORAL | 0 refills | Status: DC
  Filled 2024-02-13: qty 90, 90d supply, fill #0

## 2024-02-13 MED ORDER — ALPRAZOLAM 1 MG PO TABS: 1.0000 mg | ORAL_TABLET | Freq: Two times a day (BID) | ORAL | 2 refills | Status: AC | PRN

## 2024-02-13 NOTE — Progress Notes (Signed)
 "  Subjective:    Patient ID: [name], female    DOB: November 23, 1970, 53 y.o.   MRN: [id number]  HPI Discussed the use of AI scribe software for clinical note transcription with the patient, who gave verbal consent to proceed.  History of Present Illness [name] is a 53 year old female with diabetes who presents for follow-up on blood work and sinus symptoms.  She has been experiencing sinus symptoms for over a week, including green-yellow nasal discharge and a headache localized to the right side of her face, particularly around her cheeks. She has a history of migraines, typically on the right side, and notes that sinus pressure can trigger these migraines. Exposure to cigarette smoke and flowers has exacerbated her symptoms, causing hoarseness and difficulty speaking. She experiences tinnitus, a sensation of being off balance, and occasional dizziness. No ear pain or pressure. She has hoarseness and a scratchy throat without significant soreness, and is producing phlegm with a color. No coughing, wheezing, chest pain, or shortness of breath. No fever.  For her sinus symptoms, she has been using cetirizine , Singulair , and a Flonase  nasal mist. She takes Singulair  5 mg at night and uses the nasal mist daily.  She has a history of diabetes and mentions difficulty obtaining test strips for monitoring her blood sugar. Doing well on Mounjaro , denies any adverse effects. She is on B12 injections and reports her B12 levels have dropped slightly but remain within normal range. She is also on Xanax , which she needs a refill for. She reports that she is not on any fluid pills regularly, though she takes one occasionally as needed.  She has experienced significant weight loss, dropping below 160 pounds for the first time in years, and has improved her cholesterol and triglyceride levels significantly.      02/13/2024    9:38 AM  Depression screen PHQ 2/9  Decreased Interest 0  Down, Depressed,  Hopeless 0  PHQ - 2 Score 0  Altered sleeping 3  Tired, decreased energy 2  Change in appetite 0  Feeling bad or failure about yourself  0  Trouble concentrating 0  Moving slowly or fidgety/restless 0  Suicidal thoughts 0  PHQ-9 Score 5  Difficult doing work/chores Somewhat difficult      02/13/2024    9:39 AM 12/19/2023   12:02 PM 07/29/2023    1:25 PM 04/22/2023    1:14 PM  GAD 7 : Generalized Anxiety Score  Nervous, Anxious, on Edge 0 0 0 3  Control/stop worrying 0 0 0 3  Worry too much - different things 2 1 0 2  Trouble relaxing 0 0 0 0  Restless 0 0 0 2  Easily annoyed or irritable 0 0 0 2  Afraid - awful might happen 0 0 0 2  Total GAD 7 Score 2 1 0 14  Anxiety Difficulty Not difficult at all Not difficult at all  Somewhat difficult    Social History[1]      Objective:   Physical Exam NAD.  Alert, oriented.  TMs significant clear effusion bilaterally, no erythema.  Pharynx clear and moist.  Neck supple with mild soft anterior cervical adenopathy.  Positive right frontal sinus tenderness to percussion.  Lungs clear.  Heart regular rate rhythm.  Abdomen soft nondistended nontender.  Today's Vitals   02/13/24 0923  BP: 120/84  Pulse: 100  Temp: 98.1 F (36.7 C)  SpO2: 99%  Weight: 156 lb (70.8 kg)  Height: 5' 6 (1.676  m)   Body mass index is 25.18 kg/m.   Results for orders placed or performed in visit on 01/17/24  CBC with Differential/Platelet   Collection Time: 02/07/24  9:19 AM  Result Value Ref Range   WBC 6.6 3.4 - 10.8 x10E3/uL   RBC 4.70 3.77 - 5.28 x10E6/uL   Hemoglobin 14.0 11.1 - 15.9 g/dL   Hematocrit 56.8 65.9 - 46.6 %   MCV 92 79 - 97 fL   MCH 29.8 26.6 - 33.0 pg   MCHC 32.5 31.5 - 35.7 g/dL   RDW 87.2 88.2 - 84.5 %   Platelets 299 150 - 450 x10E3/uL   Neutrophils 53 Not Estab. %   Lymphs 36 Not Estab. %   Monocytes 7 Not Estab. %   Eos 3 Not Estab. %   Basos 1 Not Estab. %   Neutrophils Absolute 3.5 1.4 - 7.0 x10E3/uL    Lymphocytes Absolute 2.4 0.7 - 3.1 x10E3/uL   Monocytes Absolute 0.5 0.1 - 0.9 x10E3/uL   EOS (ABSOLUTE) 0.2 0.0 - 0.4 x10E3/uL   Basophils Absolute 0.1 0.0 - 0.2 x10E3/uL   Immature Granulocytes 0 Not Estab. %   Immature Grans (Abs) 0.0 0.0 - 0.1 x10E3/uL  Comprehensive metabolic panel with GFR   Collection Time: 02/07/24  9:19 AM  Result Value Ref Range   Glucose 121 (H) 70 - 99 mg/dL   BUN 9 6 - 24 mg/dL   Creatinine, Ser 9.14 0.57 - 1.00 mg/dL   eGFR 81 >40 fO/fpw/8.26   BUN/Creatinine Ratio 11 9 - 23   Sodium 143 134 - 144 mmol/L   Potassium 3.4 (L) 3.5 - 5.2 mmol/L   Chloride 96 96 - 106 mmol/L   CO2 29 20 - 29 mmol/L   Calcium  9.5 8.7 - 10.2 mg/dL   Total Protein 6.4 6.0 - 8.5 g/dL   Albumin 4.5 3.8 - 4.9 g/dL   Globulin, Total 1.9 1.5 - 4.5 g/dL   Bilirubin Total 0.5 0.0 - 1.2 mg/dL   Alkaline Phosphatase 60 49 - 135 IU/L   AST 28 0 - 40 IU/L   ALT 19 0 - 32 IU/L  Hemoglobin A1c   Collection Time: 02/07/24  9:19 AM  Result Value Ref Range   Hgb A1c MFr Bld 6.1 (H) 4.8 - 5.6 %   Est. average glucose Bld gHb Est-mCnc 128 mg/dL  Lipid panel   Collection Time: 02/07/24  9:19 AM  Result Value Ref Range   Cholesterol, Total 109 100 - 199 mg/dL   Triglycerides 95 0 - 149 mg/dL   HDL 41 >60 mg/dL   VLDL Cholesterol Cal 18 5 - 40 mg/dL   LDL Chol Calc (NIH) 50 0 - 99 mg/dL   Chol/HDL Ratio 2.7 0.0 - 4.4 ratio  Microalbumin / creatinine urine ratio   Collection Time: 02/07/24  9:19 AM  Result Value Ref Range   Creatinine, Urine 75.1 Not Estab. mg/dL   Microalbumin, Urine 66.7 Not Estab. ug/mL   Microalb/Creat Ratio 44 (H) 0 - 29 mg/g creat  Iron , TIBC and Ferritin Panel   Collection Time: 02/07/24  9:19 AM  Result Value Ref Range   Total Iron  Binding Capacity 318 250 - 450 ug/dL   UIBC 770 868 - 574 ug/dL   Iron  89 27 - 159 ug/dL   Iron  Saturation 28 15 - 55 %   Ferritin 122 15 - 150 ng/mL  B12   Collection Time: 02/07/24  9:19 AM  Result Value Ref Range  Vitamin B-12 511 232 - 1,245 pg/mL   *Note: Due to a large number of results and/or encounters for the requested time period, some results have not been displayed. A complete set of results can be found in Results Review.   Reviewed labs with patient during office visit.      Assessment & Plan:  1. Chronic insomnia - ALPRAZolam  (XANAX ) 1 MG tablet; Take 1 tablet (1 mg total) by mouth 2 (two) times daily as needed. for anxiety  Dispense: 60 tablet; Refill: 2  2. Anxiety Continue Duloxetine  and Alprazolam  as directed.  - ALPRAZolam  (XANAX ) 1 MG tablet; Take 1 tablet (1 mg total) by mouth 2 (two) times daily as needed. for anxiety  Dispense: 60 tablet; Refill: 2  3. Acute non-recurrent frontal sinusitis Sinusitis likely triggering migraine. Avoided steroids due to diabetes. - Prescribed Augmentin  for sinusitis. - Advised against concurrent use of Zyrtec  and Xyzal ; choose one antihistamine. Continue Singulair  as directed.  - Recommended Activia yogurt to prevent diarrhea from Augmentin . - amoxicillin -clavulanate (AUGMENTIN ) 875-125 MG tablet; Take 1 tablet by mouth 2 (two) times daily.  Dispense: 14 tablet; Refill: 0  4. Type 2 diabetes mellitus with diabetic neuropathy, without long-term current use of insulin  (HCC) (Primary) Blood sugar stable in prediabetic range. Slight proteinuria noted. Kidney function normal. Valsartan  recommended for kidney protection. - Prescribed valsartan  40 mg for kidney protection and potassium stabilization. - Will recheck urine protein in six months. - Continue current diabetes management plan. Mild hypokalemia: Increase potassium rich foods. May also improve with use of Valsartan .  - glucose blood (FREESTYLE LITE) test strip; Use twice daily as directed to check blood sugar  Dispense: 100 each; Refill: 5 - valsartan  (DIOVAN ) 40 MG tablet; Take 1 tablet (40 mg total) by mouth daily.  Dispense: 90 tablet; Refill: 0  5. B12 deficiency Continue current  regimen.  6. Mixed hyperlipidemia Cholesterol levels improved significantly. Triglycerides decreased. HDL increased. LDL improved. - Continue current lipid management plan including Rosuvastatin .  Return in about 3 months (around 05/13/2024).        [1]  Social History Tobacco Use   Smoking status: Never   Smokeless tobacco: Never  Vaping Use   Vaping status: Never Used  Substance Use Topics   Alcohol use: Yes    Comment: occasional    Drug use: Never   "

## 2024-02-14 ENCOUNTER — Other Ambulatory Visit: Payer: Self-pay

## 2024-02-14 ENCOUNTER — Other Ambulatory Visit (HOSPITAL_COMMUNITY): Payer: Self-pay

## 2024-02-14 ENCOUNTER — Other Ambulatory Visit: Payer: Self-pay | Admitting: Family Medicine

## 2024-02-14 ENCOUNTER — Other Ambulatory Visit: Payer: Self-pay | Admitting: Nurse Practitioner

## 2024-02-14 DIAGNOSIS — E782 Mixed hyperlipidemia: Secondary | ICD-10-CM

## 2024-02-14 MED ORDER — ROSUVASTATIN CALCIUM 10 MG PO TABS
10.0000 mg | ORAL_TABLET | Freq: Every day | ORAL | 1 refills | Status: AC
  Filled 2024-02-14 – 2024-02-28 (×2): qty 90, 90d supply, fill #0

## 2024-02-14 MED ORDER — OMEGA-3-ACID ETHYL ESTERS 1 G PO CAPS
2.0000 | ORAL_CAPSULE | Freq: Two times a day (BID) | ORAL | 1 refills | Status: AC
  Filled 2024-02-14: qty 360, 90d supply, fill #0

## 2024-02-15 ENCOUNTER — Other Ambulatory Visit: Payer: Self-pay

## 2024-02-19 ENCOUNTER — Other Ambulatory Visit: Payer: Self-pay | Admitting: Family Medicine

## 2024-02-19 ENCOUNTER — Encounter: Payer: Self-pay | Admitting: Nurse Practitioner

## 2024-02-19 DIAGNOSIS — G629 Polyneuropathy, unspecified: Secondary | ICD-10-CM

## 2024-02-20 ENCOUNTER — Other Ambulatory Visit: Payer: Self-pay

## 2024-02-20 ENCOUNTER — Other Ambulatory Visit (HOSPITAL_COMMUNITY): Payer: Self-pay

## 2024-02-20 MED ORDER — CYCLOBENZAPRINE HCL 10 MG PO TABS
10.0000 mg | ORAL_TABLET | Freq: Every day | ORAL | 0 refills | Status: AC | PRN
  Filled 2024-02-20: qty 90, 90d supply, fill #0

## 2024-02-21 ENCOUNTER — Telehealth: Payer: Self-pay | Admitting: Family Medicine

## 2024-02-21 ENCOUNTER — Telehealth: Payer: Self-pay

## 2024-02-21 NOTE — Telephone Encounter (Signed)
 See previous phone message Also important for patient to keep well-hydrated Also to make sure patient is eating to some degree as well

## 2024-02-21 NOTE — Telephone Encounter (Signed)
 Recommend to stop valsartan  Recommend follow-up office visit with one of the providers here at next available same-day slot If further troubles such as passing out, falling, symptomatic low blood pressure may need to go to ER

## 2024-02-21 NOTE — Telephone Encounter (Signed)
 Pt came in with family member appt and felt light headed - BP readings 78/53 sitting , standing 78/51 on valsartan  40 mg and reported a fall yesterday, please advise.

## 2024-02-22 ENCOUNTER — Telehealth: Payer: Self-pay

## 2024-02-22 NOTE — Telephone Encounter (Signed)
 Copied from CRM [id number]. Topic: General - Other >> Feb 21, 2024  3:38 PM Sophia H wrote: Reason for CRM: Patient is requesting to speak with Tami, please reach out. # [phone]

## 2024-02-24 ENCOUNTER — Telehealth: Payer: Self-pay

## 2024-02-24 NOTE — Telephone Encounter (Signed)
 [name] fell backwards on Monday hit her head still real sore. She stop taking her blood pressure med on Wed. She still real dizzy and her right eye blurry. She said her blood pressure today was 104/70 this morning. We have informed her to track her blood pressure for a week and I would pass this message to Shelton.

## 2024-02-24 NOTE — Telephone Encounter (Signed)
 Patient stopped BP meds- see other message

## 2024-02-25 ENCOUNTER — Encounter: Payer: Self-pay | Admitting: Nurse Practitioner

## 2024-02-25 NOTE — Telephone Encounter (Signed)
"  Mychart message sent  "

## 2024-02-28 ENCOUNTER — Other Ambulatory Visit: Payer: Self-pay

## 2024-02-28 ENCOUNTER — Other Ambulatory Visit (HOSPITAL_COMMUNITY): Payer: Self-pay

## 2024-02-29 ENCOUNTER — Other Ambulatory Visit: Payer: Self-pay

## 2024-03-12 NOTE — Telephone Encounter (Signed)
 Mychart message sent

## 2024-03-13 ENCOUNTER — Encounter: Payer: Self-pay | Admitting: Nurse Practitioner

## 2024-03-13 ENCOUNTER — Ambulatory Visit: Admitting: Nurse Practitioner

## 2024-03-13 VITALS — BP 112/77 | HR 63 | Temp 97.3°F | Ht 66.0 in | Wt 152.4 lb

## 2024-03-13 DIAGNOSIS — H539 Unspecified visual disturbance: Secondary | ICD-10-CM

## 2024-03-13 DIAGNOSIS — S0990XA Unspecified injury of head, initial encounter: Secondary | ICD-10-CM

## 2024-03-13 DIAGNOSIS — R270 Ataxia, unspecified: Secondary | ICD-10-CM | POA: Diagnosis not present

## 2024-03-13 NOTE — Progress Notes (Unsigned)
 "  Subjective:    Patient ID: [name], female    DOB: 1971/01/18, 54 y.o.   MRN: [id number]  HPI Patient is here for still having headaches, seeing black spots, and vision issues since her fall.   Patient stated that he head is still sensitive from the fall Still has migraines and takes tylenol  as needed   Discussed the use of AI scribe software for clinical note transcription with the patient, who gave verbal consent to proceed.  History of Present Illness [name] is a 54 year old female with a history of hemiplegic migraines who presents with dizziness and headaches following a fall.  Husband is present today per her request.  She has been experiencing dizziness and headaches following a fall that occurred three weeks ago. She had been on a low dose of valsartan  for blood pressure management, which she started taking on a Friday and fell the following Monday. She describes walking sideways and feeling unsteady before falling and hitting the right side of her head hard. She did not lose consciousness but felt like the air was knocked out of her. Since stopping the medication, she continues to experience occasional dizzy spells and ringing in her ears, though the dizziness has improved.  She reports persistent headaches that start on the right side of her head and radiate down towards her neck. She is unsure if these are migraines or related to the fall. Her vision has changed, with difficulty focusing and the appearance of black spots in her right eye, which she describes as her 'good eye'. She has a history of hemiplegic migraines, confirmed by a neurologist, and is currently on Emgality  and Ubrelvy  for migraine management. She takes Ubrelvy  up to six times a week, sometimes twice a day, and finds it mostly effective. She also uses Tylenol  when needed.  No nausea, vomiting, loss of vision, or numbness and weakness in her face, arms, or legs. She experiences sensitivity to loud noises  when her head hurts but not to bright lights. She has not had any open wounds or bleeding from the fall, though she felt a knot on her head initially. Her scalp remains sore, and she experiences muscle tightness, particularly on the right side of her neck and shoulders.  Her blood pressure has been stable since discontinuing valsartan , and she has lost weight, which she attributes to reduced stress and lifestyle changes. She is not on any blood thinners and has no history of significant bleeding issues. She has been staying at home and not engaging in many activities outside the house due to headaches and visual changes.        Objective:   Physical Exam NAD.  Alert, oriented.  No asymmetry of the face.  Speech clear.  TMs minimal clear effusion, no erythema.  Pupils equal and reactive to light.  EOMs intact without nystagmus.  Point-to-point localization normal limit.  Pharynx clear.  Neck supple with minimal anterior cervical adenopathy.  Mild tenderness to palpation on a localized area on the right parietal scalp.  Skin intact.  Tight tender muscles noted along the right cervical, lateral neck into the trapezius.  Normal ROM of the neck.  Lungs clear.  Heart regular rate rhythm.  Muscle strength 5+ bilaterally upper extremities.  Reflexes normal limit.  Gait slow but steady.  Romberg negative.  Today's Vitals   03/13/24 1016  BP: 112/77  Pulse: 63  Temp: (!) 97.3 F (36.3 C)  Weight: 152 lb 6 oz (  69.1 kg)  Height: 5' 6 (1.676 m)   Body mass index is 24.59 kg/m.      Assessment & Plan:  1. Head injury, acute, without loss of consciousness, initial encounter (Primary) - Ordered brain scan to rule out intracranial injury. - Continue Emgality  and Ubrelvy  for migraine management. - Consider recheck with neurology for migraine evaluation. - Advised massage therapy for muscle spasms.  2. Visual changes Question intracranial injury, possible ocular migraine.  May need follow-up with  neurology depending on CT scan results and persistence or worsening of her symptoms.  3. Ataxia Continue to use caution with sudden position change.  Warning signs reviewed.  Patient to go to ED if any new or worsening symptoms. - CT HEAD WO CONTRAST ( )  Return if symptoms worsen or fail to improve.   "

## 2024-03-14 ENCOUNTER — Encounter: Payer: Self-pay | Admitting: Nurse Practitioner

## 2024-03-14 ENCOUNTER — Other Ambulatory Visit: Payer: Self-pay

## 2024-03-15 ENCOUNTER — Encounter: Payer: Self-pay | Admitting: Nurse Practitioner

## 2024-03-16 ENCOUNTER — Ambulatory Visit (HOSPITAL_COMMUNITY)
Admission: RE | Admit: 2024-03-16 | Discharge: 2024-03-16 | Disposition: A | Source: Ambulatory Visit | Attending: Family Medicine | Admitting: Family Medicine

## 2024-03-16 DIAGNOSIS — R270 Ataxia, unspecified: Secondary | ICD-10-CM | POA: Diagnosis present

## 2024-03-18 ENCOUNTER — Other Ambulatory Visit: Payer: Self-pay | Admitting: Nurse Practitioner

## 2024-03-19 ENCOUNTER — Encounter: Payer: Self-pay | Admitting: Nurse Practitioner

## 2024-03-19 ENCOUNTER — Other Ambulatory Visit: Payer: Self-pay

## 2024-03-19 ENCOUNTER — Other Ambulatory Visit: Payer: Self-pay | Admitting: Nurse Practitioner

## 2024-03-19 MED ORDER — MOUNJARO 15 MG/0.5ML ~~LOC~~ SOAJ
15.0000 mg | SUBCUTANEOUS | 1 refills | Status: AC
  Filled 2024-03-19: qty 6, 84d supply, fill #0

## 2024-03-21 ENCOUNTER — Telehealth: Payer: Self-pay

## 2024-03-21 NOTE — Telephone Encounter (Signed)
 Patient dropped off document DMV, to be filled out by provider. Patient requested to send it back via Call Patient to pick up within ASAP. Document is located in providers tray at front office.Please advise at Mobile [phone] (mobile)   Placed in Dr Glendia office mail box

## 2024-03-22 ENCOUNTER — Ambulatory Visit: Payer: Self-pay | Admitting: Family Medicine

## 2024-03-22 NOTE — Telephone Encounter (Signed)
 Form was completed and sent upfront

## 2024-03-26 ENCOUNTER — Other Ambulatory Visit: Payer: Self-pay | Admitting: Nurse Practitioner

## 2024-03-26 ENCOUNTER — Other Ambulatory Visit (HOSPITAL_COMMUNITY): Payer: Self-pay

## 2024-03-26 ENCOUNTER — Other Ambulatory Visit: Payer: Self-pay

## 2024-03-26 MED ORDER — METFORMIN HCL 500 MG PO TABS
500.0000 mg | ORAL_TABLET | Freq: Two times a day (BID) | ORAL | 1 refills | Status: AC
  Filled 2024-03-26: qty 180, 90d supply, fill #0

## 2024-03-27 ENCOUNTER — Ambulatory Visit (HOSPITAL_COMMUNITY)

## 2024-05-08 ENCOUNTER — Ambulatory Visit: Admitting: Nurse Practitioner

## 2024-07-09 ENCOUNTER — Ambulatory Visit: Admitting: Neurology

## 2024-08-13 ENCOUNTER — Ambulatory Visit: Admitting: Nurse Practitioner
